# Patient Record
Sex: Male | Born: 1935 | Race: Black or African American | Hispanic: No | Smoking: Current every day smoker
Health system: Southern US, Community
[De-identification: ages and names within clinical notes are randomized; demographics above are authoritative.]

## PROBLEM LIST (undated history)

## (undated) DIAGNOSIS — M25512 Pain in left shoulder: Secondary | ICD-10-CM

## (undated) DIAGNOSIS — I10 Essential (primary) hypertension: Secondary | ICD-10-CM

## (undated) DIAGNOSIS — M25561 Pain in right knee: Secondary | ICD-10-CM

## (undated) DIAGNOSIS — E785 Hyperlipidemia, unspecified: Secondary | ICD-10-CM

## (undated) DIAGNOSIS — I69391 Dysphagia following cerebral infarction: Secondary | ICD-10-CM

## (undated) DIAGNOSIS — I639 Cerebral infarction, unspecified: Secondary | ICD-10-CM

## (undated) HISTORY — DX: Cerebral infarction, unspecified: I63.9

## (undated) HISTORY — PX: ROTATOR CUFF REPAIR: SHX139

## (undated) HISTORY — PX: HERNIA REPAIR: SHX51

## (undated) HISTORY — PX: CARDIAC SURGERY: SHX584

## (undated) HISTORY — PX: PEG TUBE PLACEMENT: SUR1034

## (undated) HISTORY — PX: OTHER SURGICAL HISTORY: SHX169

---

## 1997-11-09 ENCOUNTER — Ambulatory Visit (HOSPITAL_COMMUNITY): Admission: RE | Admit: 1997-11-09 | Discharge: 1997-11-09 | Payer: Self-pay | Admitting: *Deleted

## 1997-11-14 ENCOUNTER — Ambulatory Visit (HOSPITAL_COMMUNITY): Admission: RE | Admit: 1997-11-14 | Discharge: 1997-11-14 | Payer: Self-pay | Admitting: Urology

## 1998-02-06 ENCOUNTER — Ambulatory Visit (HOSPITAL_COMMUNITY): Admission: RE | Admit: 1998-02-06 | Discharge: 1998-02-06 | Payer: Self-pay | Admitting: *Deleted

## 1998-12-15 ENCOUNTER — Emergency Department (HOSPITAL_COMMUNITY): Admission: EM | Admit: 1998-12-15 | Discharge: 1998-12-15 | Payer: Self-pay | Admitting: Emergency Medicine

## 1998-12-25 ENCOUNTER — Encounter: Admission: RE | Admit: 1998-12-25 | Discharge: 1999-03-25 | Payer: Self-pay | Admitting: *Deleted

## 1999-07-24 ENCOUNTER — Ambulatory Visit (HOSPITAL_COMMUNITY): Admission: RE | Admit: 1999-07-24 | Discharge: 1999-07-24 | Payer: Self-pay | Admitting: *Deleted

## 1999-07-24 ENCOUNTER — Encounter (INDEPENDENT_AMBULATORY_CARE_PROVIDER_SITE_OTHER): Payer: Self-pay | Admitting: *Deleted

## 1999-11-15 ENCOUNTER — Emergency Department (HOSPITAL_COMMUNITY): Admission: EM | Admit: 1999-11-15 | Discharge: 1999-11-15 | Payer: Self-pay | Admitting: *Deleted

## 1999-11-26 ENCOUNTER — Encounter: Payer: Self-pay | Admitting: Surgery

## 1999-11-27 ENCOUNTER — Observation Stay (HOSPITAL_COMMUNITY): Admission: RE | Admit: 1999-11-27 | Discharge: 1999-11-28 | Payer: Self-pay | Admitting: Surgery

## 2001-08-19 ENCOUNTER — Encounter: Payer: Self-pay | Admitting: Ophthalmology

## 2001-08-22 ENCOUNTER — Ambulatory Visit (HOSPITAL_COMMUNITY): Admission: RE | Admit: 2001-08-22 | Discharge: 2001-08-22 | Payer: Self-pay | Admitting: Ophthalmology

## 2002-04-06 ENCOUNTER — Ambulatory Visit (HOSPITAL_COMMUNITY): Admission: RE | Admit: 2002-04-06 | Discharge: 2002-04-06 | Payer: Self-pay | Admitting: Ophthalmology

## 2003-01-31 ENCOUNTER — Emergency Department (HOSPITAL_COMMUNITY): Admission: EM | Admit: 2003-01-31 | Discharge: 2003-01-31 | Payer: Self-pay | Admitting: Emergency Medicine

## 2003-01-31 ENCOUNTER — Encounter: Payer: Self-pay | Admitting: Emergency Medicine

## 2005-01-01 ENCOUNTER — Inpatient Hospital Stay (HOSPITAL_COMMUNITY): Admission: EM | Admit: 2005-01-01 | Discharge: 2005-01-01 | Payer: Self-pay | Admitting: *Deleted

## 2006-11-09 ENCOUNTER — Inpatient Hospital Stay (HOSPITAL_COMMUNITY): Admission: EM | Admit: 2006-11-09 | Discharge: 2006-11-10 | Payer: Self-pay | Admitting: Emergency Medicine

## 2006-12-25 ENCOUNTER — Emergency Department (HOSPITAL_COMMUNITY): Admission: EM | Admit: 2006-12-25 | Discharge: 2006-12-25 | Payer: Self-pay | Admitting: Emergency Medicine

## 2008-06-07 ENCOUNTER — Emergency Department (HOSPITAL_COMMUNITY): Admission: EM | Admit: 2008-06-07 | Discharge: 2008-06-07 | Payer: Self-pay | Admitting: Emergency Medicine

## 2009-08-20 ENCOUNTER — Emergency Department (HOSPITAL_COMMUNITY): Admission: EM | Admit: 2009-08-20 | Discharge: 2009-08-20 | Payer: Self-pay | Admitting: Emergency Medicine

## 2009-08-23 ENCOUNTER — Emergency Department (HOSPITAL_COMMUNITY): Admission: EM | Admit: 2009-08-23 | Discharge: 2009-08-23 | Payer: Self-pay | Admitting: Emergency Medicine

## 2009-08-27 ENCOUNTER — Emergency Department (HOSPITAL_COMMUNITY): Admission: EM | Admit: 2009-08-27 | Discharge: 2009-08-27 | Payer: Self-pay | Admitting: Emergency Medicine

## 2010-10-20 LAB — GLUCOSE, CAPILLARY: Glucose-Capillary: 185 mg/dL — ABNORMAL HIGH (ref 70–99)

## 2010-10-20 LAB — URINALYSIS, ROUTINE W REFLEX MICROSCOPIC
Bilirubin Urine: NEGATIVE
Glucose, UA: NEGATIVE mg/dL
Hgb urine dipstick: NEGATIVE
Hgb urine dipstick: NEGATIVE
Ketones, ur: 15 mg/dL — AB
Specific Gravity, Urine: 1.013 (ref 1.005–1.030)
pH: 5.5 (ref 5.0–8.0)
pH: 7 (ref 5.0–8.0)

## 2010-10-20 LAB — URINE MICROSCOPIC-ADD ON

## 2010-12-19 NOTE — Op Note (Signed)
. [facility]  Patient:    [name], [name]                       MRN: [id number] Proc. Date: 11/27/99 Adm. Date:  [id number] Attending:  Abigail Miyamoto A                           Operative Report  PREOPERATIVE DIAGNOSIS:  Umbilical hernia.  POSTOPERATIVE DIAGNOSIS:  Umbilical hernia.  PROCEDURE:  Repair of umbilical hernia.  SURGEON:  Abigail Miyamoto, M.D.  ANESTHESIA:  General endotracheal anesthesia.  ESTIMATED BLOOD LOSS:  Minimal.  PROCEDURE IN DETAIL:  The patient was brought to the operating room and identified as Bill Jackson.  He was placed supine on the operating room table and then general anesthesia was induced.  Next, the patients abdomen was prepped and draped in the usual sterile fashion.  Using a #10 blade, a small transverse incision was made  just above the umbilicus.  The incision was carried down through the abdominal fascia with the electrocautery.  The umbilical hernia sac was then controlled circumferentially with a hemostat.  Then using the electrocautery, it was separated from the overlying umbilical sac.  The sac was then completely excised.  A finger was then passed into the abdominal cavity and a second fascial defect was identified superior in the midline to the umbilical hernia defect.  This hernia sac contained preperitoneal fat.  This was excised.  The fascia was then opened up connecting the two defects.  Next, the fascial defect was closed with interrupted 0 Prolene sutures.  Once the fascia was reapproximated, the wound was irrigated  with normal saline.  The umbilical skin was then sutured back in place with a 0 Vicryl suture.  Subcutaneous layer was then reapproximated with interrupted 3-0 Vicryl sutures and skin was closed with running 4-0 Monocryl. Steri-Strips, gauze and Tegaderm were applied.  The patient tolerated the procedure well. All sponge, needle and instrument counts were correct at  the end of the procedure. The patient was then extubated in the operating room and taken in stable condition o the recovery room. DD:  11/27/99 TD:  11/28/99 Job: [id number]

## 2010-12-19 NOTE — Op Note (Signed)
[place]  Patient:    Bill Jackson, Bill Jackson Visit Number: [id number] MRN: [id number]          Service Type: DSU Location: [address] [facility] 2899 20 Attending Physician:  Karenann Cai Dictated by:   Marya Landry Carlyle Lipa., M.D. Proc. Date: 08/25/01 Admit Date:  08/22/2001 Discharge Date: 08/22/2001                             Operative Report  PREOPERATIVE DIAGNOSIS:  Immature cataract - right eye.  POSTOPERATIVE DIAGNOSIS:  Immature cataract - right eye.  PROCEDURE:  Kelman phacoemulsification cataract - right eye with intraocular lens implantation.  ANESTHESIA:  Local using Xylocaine 2% with Marcaine 0.75% and Wydase.  JUSTIFICATION FOR PROCEDURE:  This is a 76 year old gentleman with a history of trauma to the right eye, who presented complaining of blurring of vision. He was evaluated and found to have a marked anisometropia with a visual acuity best corrected to 20/80 on the right and 20/100 on the left.  There was a dense cataract on the right with a lesser cataract on the left.  Cataract extraction with intraocular lens implantation was recommended.  He is admitted at this time for that purpose.  DESCRIPTION OF PROCEDURE:  Under the influence of IV sedation, a Van Lint akinesia and retrobulbar anesthesia was given.  The patient was prepped and draped in the usual manner.  The lid speculum was inserted under the upper and lower lid of the right eye and a 4-0 silk traction suture was passed through the belly of the superior rectus muscle for traction.  A fornix-based conjunctival flap was turned and hemostasis achieved by using cautery.  An incision made in the sclera approximately 1 mm posterior to the limbus.  This incision was dissected down into clear cornea using crescent blade.  A side port incision was made at the 1:30 oclock position.  Occucoat was injected into the eye through the side port incision.  The anterior chamber was  then entered through the corneoscleral tunnel incision at the 11:30 oclock position with a 3.2 mm keratome.  An anterior capsulotomy was done using a bent 25-gauge needle.  The nucleus was hydrodissected using Xylocaine.  The KPE handpiece was passed into the eye and the nucleus was emulsified without difficulty.  The residual cortical material was aspirated.  The posterior capsule was polished using olive tip polisher.  The wound was widened slightly to accommodate an oval 5 x 6 intraocular lens.  This lens was seated into the eye, but as it was seated, it was noted to sink towards the bottom.  There at no time had there been vitreous loss and there was no observed rupture of the posterior capsule.  The lens was retrieved with difficulty and an anterior vitrectomy was done.  The wound was widened so that an anterior chamber lens could be inserted into the eye across the pupil.  This insertion was done without difficulty.  A peripheral iridectomy was made in the iris. The corneoscleral wound was then closed by using a combination of 8-0 Vicryl and 10-0 nylon.  After ascertaining that the wound was airtight and watertight, the conjunctiva was closed using thermocautery.   One cc of Celestone, 0.5 cc of gentamicin were injected subconjunctivally.  Maxitrol  ophthalmic ointment and Pilopine ointment and atropine drops were applied along with a patch and Fox shield.  The patient tolerated the procedure well and  was discharged to the postanesthesia recovery in satisfactory condition. He was instructed to rest today, to take Vicodin q.4h. as needed for pain and see me in the office tomorrow for further evaluation.  DISCHARGE DIAGNOSIS:  Immature cataract - right eye. Dictated by:   Marya Landry Carlyle Lipa., M.D. Attending Physician:  Karenann Cai DD:  08/25/01 TD:  08/25/01 Job: [id number]

## 2010-12-19 NOTE — Discharge Summary (Signed)
[name], [name]                ACCOUNT NO.:  [id number]   MEDICAL RECORD NO.:  [id number]          PATIENT TYPE:  INP   LOCATION:  2031                         FACILITY:  [facility]   PHYSICIAN:  Dani Gobble, MD       DATE OF BIRTH:  02/17/1935   DATE OF ADMISSION:  11/09/2006  DATE OF DISCHARGE:  11/10/2006                               DISCHARGE SUMMARY   DISCHARGE DIAGNOSES:  1. Known cardiac chest pain - etiology undetermined.  2. Diabetes mellitus.  3. Hypertension.  4. Known coronary artery disease with remote percutaneous transluminal      coronary angioplasty, approximately 15 years ago.  5. Gastroesophageal reflux disease.  6. Arthritis.  7. Dyslipidemia with statin intolerance.   [name] is a 76 year old gentleman presented to [facility]  for evaluation of complaints of lightheadedness and chest pressure.  He  has a previous history of coronary artery disease and approximately 15  years ago underwent angioplasty and a repeat cath was a year after the  procedure and none since then.  He woke up on the morning of his  presentation to the emergency room, felt well, but then all of sudden  while making coffee started feeling really lightheaded and then  developed chest tightness and pressure, as well as shortness of breath.  He came to the emergency room where he was evaluated by Dr. Domingo Sep who  admitted him to rule out MI protocol.   We cycled his enzymes, which were negative x3 and on the next day, November 10, 2006, patient underwent coronary angiography by Dr. Clarene Duke.  Cath  revealed normal RCA, normal circumflex of all the branches and only 40%  of the proximal LAD stenosis, but mid and distal heart were free of  disease, 2 diagonals were patent also.  Left ventricular systolic  function was normal with ejection fraction 60%.  The distal lower was  normal.  No aneurysm.  No iliac disease and no renal artery stenosis  noted.   Patient was discharged home  later after catheterization when his bedrest  expired.   HOSPITAL LABORATORIES:  BMP revealed a sodium of 140, potassium 3.5,  chloride 106, CO2 26, glucose 188, BUN 14, creatinine 1.09.  CBC showed  white blood cell count 4.3, hemoglobin 12.7, hematocrit 36.2, platelet  count 234.   Lipid profile showed total cholesterol 265, triglycerides 111, HDL 107,  LDL 126.   DISCHARGE MEDICATIONS:  1. Protonix 40 mg daily.  2. Toprol-XL 25 mg daily.  3. Glipizide 5 mg daily.  4. Aspirin 81 mg daily.  5. Actos resume home dose.  6. Novolin 70/30 mg subcu as before.   DISCHARGE INSTRUCTION:  No driving and no lifting greater than 5 pounds  for 3 days postcath.  Report to our office any problems with any groin  puncture site.   DISCHARGE FOLLOWUP:  April 29 at 12 p.m. with Dr. Clarene Duke.      Raymon Mutton, P.A.    ______________________________  Dani Gobble, MD    MK/MEDQ  D:  12/31/2006  T:  01/01/2007  Job:  [id number]   cc:   Thereasa Solo. Little, M.D.

## 2010-12-19 NOTE — Op Note (Signed)
NAME:  Bill Jackson, Bill Jackson                          ACCOUNT NO.:  [id number]   MEDICAL RECORD NO.:  [id number]                   PATIENT TYPE:  OIB   LOCATION:  2899                                 FACILITY:  [facility]   PHYSICIAN:  Marya Landry. Carlyle Lipa., M.D.      DATE OF BIRTH:  [birth year]   DATE OF PROCEDURE:  04/06/2002  DATE OF DISCHARGE:  04/06/2002                                 OPERATIVE REPORT   PREOPERATIVE DIAGNOSIS:  Immature cataract, left eye.   POSTOPERATIVE DIAGNOSIS:  Immature cataract, left eye.   OPERATION:  Kelman phacoemulsification of cataract, left eye.   ANESTHESIA:  Local using Xylocaine 2% with Marcaine 0.75% and Wydase.   JUSTIFICATION FOR PROCEDURE:  This is a 76 year old diabetic gentleman who  has undergone a cataract extraction of the right eye.  He still complains of  difficulty seeing to read and to drive.  He works as a Midwife.  He has  diabetic retinopathy.  Because of his complaint of difficulty seeing to do  his work, cataract extraction was recommended.  He is admitted at this time  for that procedure.   Under the influence of IV sedation and Van Lint akinesia, retrobulbar  anesthesia was given.  The patient was prepped and draped in the usual  manner.  The lid speculum was inserted under the under lower lid of the left  eye and a 4-0 silk retraction suture was passed through the belly of the  superior rectus muscle for retraction.  A fornix-based conjunctival flap was  turned and hemostasis achieved with the use of the cautery.   A grooved incision was made in the sclera at the limbus approximately 1 mm  posterior to the limbus.  This incision was dissected down to clear cornea  using a crescent blade.  A side-port incision was made at the 1:30 o'clock  position.  OcuCoat was injected into the eye through the side-port incision.  The anterior chamber was entered through the corneoscleral tunnel incision  at the 11:30 o'clock position.  An  anterior capsulotomy was done using a  bent 25-gauge needle.  The nucleus was hydrodissected using Xylocaine.  The  PE handpiece was passed into the eye and the nucleus of the muscle followed  without difficulty.  The residual cortical material was aspirated.  During  the aspiration of the cortex, there appeared to have been a small tear to  occur inferiorly in the posterior capsule.  No vitreous was entered or  expressed from the wound.  As much of the cortex as could be aspirated was  aspirated.  The wound was advanced slightly to accommodate easily a 60 rigid  posterior chamber lens.  This lens was seated into the eye behind the iris  without difficulty.  The anterior chamber was reformed and the pupil was  constricted using Miochol.  The residual OcuCoat was aspirated from the eye.  The corneoscleral wound was closed using a  single horizontal suture of 10-0  nylon.  After it was ascertained that the wound was watertight, the  conjunctiva was closed using thermocautery.  Celestone, 0.1 cc, and 0.5 cc  of gentamicin were injected subconjunctivally.  Maxitrol ophthalmic ointment  and Pilopine ointment were applied along with the patch and Fox shield.  The  patient tolerated the procedure well and was discharged to the  postanesthesia recovery room in satisfactory condition.   He is instructed to rest today, to take Vicodin every four hours as needed  for the pain and to be seen in my office tomorrow for further evaluation.   DISCHARGE DIAGNOSIS:  Immature cataract, left eye.                                                Marya Landry. Carlyle Lipa., M.D.    TEB/MEDQ  D:  04/06/2002  T:  04/07/2002  Job:  [id number]

## 2010-12-19 NOTE — H&P (Signed)
Bill Jackson, Bill Jackson NO.:  [id number]   MEDICAL RECORD NO.:  [id number]          PATIENT TYPE:  EMS   LOCATION:  MAJO                         FACILITY:  [facility]   PHYSICIAN:  Deirdre Peer. Polite, M.D. DATE OF BIRTH:  07-22-1935   DATE OF ADMISSION:  12/31/2004  DATE OF DISCHARGE:                                HISTORY & PHYSICAL   CHIEF COMPLAINT:  Chest wall pain, nausea, and vomiting.   HISTORY OF PRESENT ILLNESS:  Bill Jackson is a pleasant 76 year old male with  known history of hypertension, diabetes, high cholesterol who presents to  the ED with complaints of chest pain, status post fall approximately three  to four days ago, as well as emesis. The patient had been in his usual state  of health until Sunday when he got in the middle of the night, fell, and hit  the small end table with the left side of his chest. Since then, the patient  states he has not been feeling good. The patient stated on Tuesday, he had  two episodes of emesis. Denies any blood. Throughout that time, the patient  states that the chest wall pain has been increasing in severity and  therefore presented to the ED.   In the ED, the patient was evaluated. The patient had a CT which did not  show any acute abnormalities related to internal organs, i.e. liver or  spleen laceration. The patient did have some hypodense lesions in the spleen  which outpatient follow-up was probably recommended. The patient had further  labs which showed a mild elevation in amylase. [facility] was called  for further evaluation and treatment of pancreatitis and chest wall  contusion. At the time of my evaluation, the patient is alert and oriented  x3 and in no apparent distress. Did complain of left chest wall pain with  palpation. Denies any fevers or chills. States that he had emesis on Tuesday  but none since then. He denies any diarrhea, constipation. No dysuria. The  patient does drink on a daily basis;  however, states that he has not been  feeling well so he has not drank since Sunday night. Denies having any  withdrawal symptoms. Currently, states he has never had these symptoms  before. Admission is deemed necessary for further evaluation and treatment  of the pancreatitis and chest wall contusion.   PAST MEDICAL HISTORY:  1.  Diabetes.  2.  Hypertension.  3.  High cholesterol.  4.  The patient denies any lung disease, cancer, or CVA.   MEDICATIONS:  1.  Glipizide 10 mg b.i.d.  2.  Actos 45 mg daily.  3.  Glucophage 1 gm b.i.d.  4.  Insulin 70/30 b.i.d.  5.  The patient takes blood pressure medicine for which he is unsure.   SOCIAL HISTORY:  Positive for alcohol on a daily basis. A couple glasses of  wine as well as alcohol daily. Positive tobacco, pack per day. Denies any  drugs. The patient states his last drink has been Sunday. Denies having any  withdrawal symptoms.   PAST SURGICAL HISTORY:  1.  Umbilical hernia approximately five years ago.  2.  Right rotator cuff repair in the past.   ALLERGIES:  Reports allergy to PENICILLIN and intolerance to cholesterol  medicine which causes cramps.   FAMILY HISTORY:  Mother with coronary artery disease. Father unknown.  Brother and sister are essentially healthy.   REVIEW OF SYMPTOMS:  As stated in the HPI.   PHYSICAL EXAMINATION:  GENERAL:  The patient is alert and oriented x3. Mild  to moderate distress secondary to the left chest wall pain.  VITAL SIGNS:  Temperature 97.3, blood pressure 141/81, pulse 101,  respiratory rate 20. Saturating 97%.  HEENT:  Significant for muddy sclerae. Positive for arcus senilis as well as  positive for cataract surgery. Moist mucus membranes.  NECK:  No nodes. No JVD.  CHEST:  Moderate air movement without rales or rhonchi.  CARDIOVASCULAR:  Regular. No S3.  ABDOMEN:  Positive bowel sounds. Positive tenderness on the left chest wall  with some bruising as well from prior fall.   EXTREMITIES:  No clubbing, cyanosis, or edema.  NEUROLOGICAL:  Cranial nerves II through XII intact. Motor is 5/5. Negative  Romberg. Deep tendon reflexes are symmetrical. Gait not tested.  RECTAL:  Deferred.   LABORATORY DATA:  CT of the abdomen and pelvis show hypodense lesion in the  liver. Recommend outpatient follow-up. CBC shows white count 5.9, hemoglobin  16, MCV 92, platelets 344,000. Sodium 142, potassium 4.4, chloride 100,  carbon dioxide 29, BUN 27, creatinine 1.9, amylase 173, lipase 35. UA shows  a specific gravity of 1.033, 100 protein, 15 ketones, moderate bilirubin.  Leukocyte esterase small, few bacteria.   ASSESSMENT/PLAN:  1.  Mild pancreatitis, most likely secondary to alcohol use.  2.  Chest wall contusion on the left, status post fall. Please note CT is      negative for any acute abnormalities, i.e. splenic or liver lacerations.  3.  Diabetes.  4.  Hypertension.  5.  Daily alcohol use.  6.  Tobacco use.  7.  Abnormal CT showing hypodense liver lesions, questionable cyst.  8.  Azotemia, questionable baseline.   RECOMMENDATIONS:  Recommend the patient be admitted for observation. We will  start him on IV fluids, IV analgesia. Check follow-up renal function post IV  fluids as the patient states that he recently had a full physical from  his primary. At this time, we will not pursue lipids or hemoglobin A1c as  the information has most likely been ordered by his primary doctor. We will  have follow up amylase, lipase, and BMET if labs are within normal limits.  The patient most likely can be discharged in the a.m. with further  outpatient follow up with his primary doctor.       RDP/MEDQ  D:  12/31/2004  T:  01/01/2005  Job:  [id number]

## 2010-12-19 NOTE — Cardiovascular Report (Signed)
NAMEJABRON, WEESE NO.:  [id number]   MEDICAL RECORD NO.:  [id number]          PATIENT TYPE:  INP   LOCATION:  2031                         FACILITY:  [facility]   PHYSICIAN:  Thereasa Solo. Little, M.D. DATE OF BIRTH:  [birth year]   DATE OF PROCEDURE:  11/10/2006  DATE OF DISCHARGE:                            CARDIAC CATHETERIZATION   INDICATIONS FOR PROCEDURE:  This 76 year old male was admitted with  chest discomfort and light-headedness.  He had a prior history of an  angioplasty approximately 20 years ago by Dr. Daisy Floro.  There are no  records available.  His cardiac enzymes are unremarkable, and he was  brought to the cath lab for evaluation of his coronary anatomy.   After obtaining informed consent, the patient was prepped and draped in  the usual sterile fashion, exposing the right groin.  Applying local  anesthetic with 1% Xylocaine, the Seldinger technique was employed, and  a 5-French introducer sheath was placed into the right femoral artery.  Left and right coronary arteriography and ventriculography in the RAO  projection and a distal aortogram was performed.   1. Total contrast 125 mL.  2. Equipment:  A 5-French diagnostic Judkins configuration catheters.  3. Complications:  None.   MEDICATIONS:  2 mg of IV versed.   RESULTS:  1. Hemodynamic monitoring:  Central aortic pressure was 142/83.  Left      ventricular pressure was 141/7 and there was no aortic valve      gradient noted at the time of pullback.  2. Ventriculography:  Ventriculography in the area of projection using      25 mL of contrast at 12 mL per second revealed good opacification      of the left ventricle.  There was normal LV systolic function.      Ejection fraction was approximately 60%.  The left ventricular end      diastolic pressure was 15, and no mitral regurgitation was      appreciated.  3. Distal aortogram:  The distal aortogram done above the level of the      renal  arteries showed no evidence of renal artery stenosis, no      evidence of abdominal aortic aneurysm, and no iliac disease.  4. Coronary arteriography:  On fluoroscopy, no calcification was      appreciated.   1. Left main was short and bifurcated and was free of disease.  2. LAD:  The LAD extended down and across the apex of the heart.  The      proximal portion of the LAD had areas of 30-40% sequential      narrowing.  There was brisk TIMI 3 flow.  There were 2 diagonal      vessels that were free of disease, and the mid and distal LAD were      free of disease.  3. Circumflex:  The circumflex was a dominant vessel, giving rise to a      small PDA.  There was a very large and long first OM vessel that      was free of  disease, a medium-size second OM vessel, and a small      third OM vessel.  This entire system was free of disease.  4. Right coronary artery:  This was a nondominant vessel, supplying      only the RV free-wall and was free of disease.   CONCLUSION:  1. Normal left ventricular systolic function.  2. No abdominal aortic aneurysm.  3. No renal artery stenosis.  4. Mild proximal left anterior descending disease with sequential 30%      and 40% areas of narrowing before the diagonals.   I clearly cannot explain his chest pain from a cardiac standpoint.   I have discontinued his IV heparin and I have discontinued his  nitroglycerin.  He should be ready for discharge later today.           ______________________________  Thereasa Solo. Little, M.D.     ABL/MEDQ  D:  11/10/2006  T:  11/10/2006  Job:  [id number]   cc:   Cath Lab  Dani Gobble, MD  Derry Skill, MD

## 2011-12-05 ENCOUNTER — Encounter (HOSPITAL_COMMUNITY): Payer: Self-pay | Admitting: Emergency Medicine

## 2011-12-05 ENCOUNTER — Emergency Department (HOSPITAL_COMMUNITY): Payer: Medicare Other

## 2011-12-05 ENCOUNTER — Emergency Department (HOSPITAL_COMMUNITY)
Admission: EM | Admit: 2011-12-05 | Discharge: 2011-12-05 | Disposition: A | Discharge status: Home / Self Care | Payer: Medicare Other | Attending: Emergency Medicine | Admitting: Emergency Medicine

## 2011-12-05 DIAGNOSIS — E119 Type 2 diabetes mellitus without complications: Secondary | ICD-10-CM | POA: Insufficient documentation

## 2011-12-05 DIAGNOSIS — R220 Localized swelling, mass and lump, head: Secondary | ICD-10-CM | POA: Insufficient documentation

## 2011-12-05 DIAGNOSIS — R05 Cough: Secondary | ICD-10-CM | POA: Insufficient documentation

## 2011-12-05 DIAGNOSIS — F17200 Nicotine dependence, unspecified, uncomplicated: Secondary | ICD-10-CM | POA: Insufficient documentation

## 2011-12-05 DIAGNOSIS — Z79899 Other long term (current) drug therapy: Secondary | ICD-10-CM | POA: Insufficient documentation

## 2011-12-05 DIAGNOSIS — Z794 Long term (current) use of insulin: Secondary | ICD-10-CM | POA: Insufficient documentation

## 2011-12-05 DIAGNOSIS — R0682 Tachypnea, not elsewhere classified: Secondary | ICD-10-CM | POA: Insufficient documentation

## 2011-12-05 DIAGNOSIS — I10 Essential (primary) hypertension: Secondary | ICD-10-CM | POA: Insufficient documentation

## 2011-12-05 DIAGNOSIS — J3489 Other specified disorders of nose and nasal sinuses: Secondary | ICD-10-CM | POA: Insufficient documentation

## 2011-12-05 DIAGNOSIS — R079 Chest pain, unspecified: Secondary | ICD-10-CM | POA: Insufficient documentation

## 2011-12-05 DIAGNOSIS — R062 Wheezing: Secondary | ICD-10-CM | POA: Insufficient documentation

## 2011-12-05 DIAGNOSIS — J069 Acute upper respiratory infection, unspecified: Secondary | ICD-10-CM | POA: Insufficient documentation

## 2011-12-05 DIAGNOSIS — R059 Cough, unspecified: Secondary | ICD-10-CM | POA: Insufficient documentation

## 2011-12-05 HISTORY — DX: Essential (primary) hypertension: I10

## 2011-12-05 MED ORDER — ALBUTEROL SULFATE HFA 108 (90 BASE) MCG/ACT IN AERS
2.0000 | INHALATION_SPRAY | RESPIRATORY_TRACT | Status: DC | PRN
  Administered 2011-12-05: 2 via RESPIRATORY_TRACT
  Filled 2011-12-05: qty 6.7

## 2011-12-05 NOTE — ED Provider Notes (Signed)
History     CSN: [id number]  Arrival date & time 12/05/11  0811   First MD Initiated Contact with Patient 12/05/11 [phone]      Chief Complaint  Patient presents with  . URI    (Consider location/radiation/quality/duration/timing/severity/associated sxs/prior treatment) Patient is a 77 y.o. male presenting with URI. The history is provided by the patient.  URI The primary symptoms include cough and wheezing. Primary symptoms do not include fever, headaches, abdominal pain, nausea or vomiting. Episode onset: 3 weeks ago. This is a new problem. The problem has been gradually worsening.  Episode onset: 3 weeks ago. The cough is productive. The sputum is yellow. It is exacerbated by allergens.  Associated with: No recent sick contacts. Symptoms associated with the illness include congestion and rhinorrhea. The illness is not associated with chills or facial pain. The following treatments were addressed: Decongestant: Somewhat effective. Risk factors for severe complications from URI include being elderly.    Past Medical History  Diagnosis Date  . Hypertension   . Diabetes mellitus     Past Surgical History  Procedure Date  . Heart stents x3  . Cardiac surgery   . Hernia repair   . Rotator cuff repair     History reviewed. No pertinent family history.  History  Substance Use Topics  . Smoking status: Current Everyday Smoker  . Smokeless tobacco: Not on file  . Alcohol Use: Yes      Review of Systems  Constitutional: Negative for fever and chills.  HENT: Positive for congestion and rhinorrhea.   Respiratory: Positive for cough and wheezing.   Gastrointestinal: Negative for nausea, vomiting and abdominal pain.  Neurological: Negative for headaches.  All other systems reviewed and are negative.    Allergies  Penicillins  Home Medications   Current Outpatient Rx  Name Route Sig Dispense Refill  . BENAZEPRIL HCL 40 MG PO TABS Oral Take 40 mg by mouth daily.    Marland Kitchen  GLIPIZIDE PO Oral Take 1 tablet by mouth daily.    . INSULIN ISOPHANE & REGULAR (70-30) 100 UNIT/ML [place] SUSP Subcutaneous Inject 15 Units into the skin as needed. Per sliding scale    . METFORMIN HCL 1000 MG PO TABS Oral Take by mouth daily.    Marland Kitchen OVER THE COUNTER MEDICATION Oral Take 3 capsules by mouth daily. 3 pack multivitamins    . PRESCRIPTION MEDICATION Oral Take 1 tablet by mouth daily. Acid reflux    . PRESCRIPTION MEDICATION Oral Take 1 tablet by mouth daily. Fluid pill    . PRESCRIPTION MEDICATION Oral Take 1 tablet by mouth daily. Allergy medication      BP 128/77  Pulse 61  Temp(Src) 97.9 F (36.6 C) (Oral)  Resp 20  SpO2 98%  Physical Exam  Constitutional: He is oriented to person, place, and time. He appears well-developed and well-nourished. No distress.  HENT:  Head: Normocephalic and atraumatic.  Right Ear: External ear normal.  Left Ear: External ear normal.  Nose: Mucosal edema and rhinorrhea present.  Mouth/Throat: Oropharynx is clear and moist.  Eyes: Conjunctivae and EOM are normal. Pupils are equal, round, and reactive to light. Right eye exhibits no discharge.  Neck: Normal range of motion. Neck supple.  Cardiovascular: Normal rate, regular rhythm, normal heart sounds and intact distal pulses.   No murmur heard. Pulmonary/Chest: Tachypnea noted. No respiratory distress. He has no decreased breath sounds. He has wheezes. He has no rhonchi. He has no rales.  Scant wheezes and lower lung fields  Abdominal: Soft. There is no tenderness.  Musculoskeletal: Normal range of motion. He exhibits no edema and no tenderness.  Neurological: He is alert and oriented to person, place, and time.  Skin: Skin is warm and dry. No rash noted.  Psychiatric: He has a normal mood and affect.    ED Course  Procedures (including critical care time)  Labs Reviewed - No data to display Dg Chest 2 View  12/05/2011  *RADIOLOGY REPORT*  Clinical Data: Cough.  Chest pain.  Chest  congestion.  Long-time smoker.  CHEST - 2 VIEW 12/05/2011:  Comparison: Portable chest x-ray 11/09/2006, 01/31/2003, and one- view chest x-ray 12/31/2004.  Findings: Cardiac silhouette normal in size, unchanged.  Thoracic aorta mildly tortuous.  Hilar and mediastinal contours otherwise unremarkable. Lungs clear.  Bronchovascular markings normal. Pulmonary vascularity normal.  No pneumothorax.  No pleural effusions.  Mild degenerative changes involving the thoracic spine and mild thoracolumbar scoliosis convex right.  No significant interval change.  IMPRESSION: No acute cardiopulmonary disease.  Stable examination.  Original Report Authenticated By: Arnell Sieving, M.D.    Date: 12/05/2011  Rate: 54  Rhythm: normal sinus rhythm  QRS Axis: normal  Intervals: normal  ST/T Wave abnormalities: nonspecific T wave changes  Conduction Disutrbances:none  Narrative Interpretation:   Old EKG Reviewed: unchanged     No diagnosis found.    MDM   Pt with symptoms consistent with viral URI vs allergies with wheezing at night and chest congestion.  He states that in nov he had PNA and did not want to miss anything.  Denies fever or SOB and normal VS here.  Well appearing here.  No signs of breathing difficulty scant wheezes on exam. No signs of pharyngitis, otitis or abnormal abdominal findings.   Patient has an extensive heart history and states that he gets intermittent chest pain since his symptoms started however the chest pain is not concerning for cardiac etiology. He states it occurs after severe coughing spells. He is not currently having any pain now and states his pain does not feel like his prior heart episodes. CXR wnl and pt to return with any further problems.  Pt given inhaler and encourage to f/u with PMD.         Gwyneth Sprout, MD 12/05/11 1001

## 2011-12-05 NOTE — ED Notes (Signed)
Back from xray

## 2011-12-05 NOTE — ED Notes (Signed)
Pt reports cough and cold symptoms with wheezing x 3 weeks. Pt also reports intermittent left sided chest pain onset after coughing.

## 2011-12-05 NOTE — ED Notes (Signed)
Discharged with instructions and inhaler with an understanding of the use.

## 2011-12-05 NOTE — ED Notes (Signed)
Patient to xray via stretcher

## 2012-02-09 ENCOUNTER — Emergency Department (HOSPITAL_COMMUNITY): Payer: Medicare Other

## 2012-02-09 ENCOUNTER — Encounter (HOSPITAL_COMMUNITY): Payer: Self-pay | Admitting: Emergency Medicine

## 2012-02-09 ENCOUNTER — Emergency Department (HOSPITAL_COMMUNITY)
Admission: EM | Admit: 2012-02-09 | Discharge: 2012-02-10 | Disposition: A | Discharge status: Home Health | Payer: Medicare Other | Attending: Emergency Medicine | Admitting: Emergency Medicine

## 2012-02-09 DIAGNOSIS — F17200 Nicotine dependence, unspecified, uncomplicated: Secondary | ICD-10-CM | POA: Insufficient documentation

## 2012-02-09 DIAGNOSIS — R6883 Chills (without fever): Secondary | ICD-10-CM | POA: Insufficient documentation

## 2012-02-09 DIAGNOSIS — I4460 Unspecified fascicular block: Secondary | ICD-10-CM | POA: Insufficient documentation

## 2012-02-09 DIAGNOSIS — Z794 Long term (current) use of insulin: Secondary | ICD-10-CM | POA: Insufficient documentation

## 2012-02-09 DIAGNOSIS — W19XXXA Unspecified fall, initial encounter: Secondary | ICD-10-CM

## 2012-02-09 DIAGNOSIS — I10 Essential (primary) hypertension: Secondary | ICD-10-CM | POA: Insufficient documentation

## 2012-02-09 DIAGNOSIS — M25569 Pain in unspecified knee: Secondary | ICD-10-CM | POA: Insufficient documentation

## 2012-02-09 DIAGNOSIS — IMO0002 Reserved for concepts with insufficient information to code with codable children: Secondary | ICD-10-CM

## 2012-02-09 DIAGNOSIS — F1010 Alcohol abuse, uncomplicated: Secondary | ICD-10-CM | POA: Insufficient documentation

## 2012-02-09 DIAGNOSIS — R29898 Other symptoms and signs involving the musculoskeletal system: Secondary | ICD-10-CM | POA: Insufficient documentation

## 2012-02-09 DIAGNOSIS — M79609 Pain in unspecified limb: Secondary | ICD-10-CM | POA: Insufficient documentation

## 2012-02-09 DIAGNOSIS — E119 Type 2 diabetes mellitus without complications: Secondary | ICD-10-CM | POA: Insufficient documentation

## 2012-02-09 DIAGNOSIS — G319 Degenerative disease of nervous system, unspecified: Secondary | ICD-10-CM | POA: Insufficient documentation

## 2012-02-09 DIAGNOSIS — R296 Repeated falls: Secondary | ICD-10-CM | POA: Insufficient documentation

## 2012-02-09 LAB — URINALYSIS, ROUTINE W REFLEX MICROSCOPIC
Leukocytes, UA: NEGATIVE
Protein, ur: NEGATIVE mg/dL
pH: 5 (ref 5.0–8.0)

## 2012-02-09 LAB — POCT I-STAT, CHEM 8
Calcium, Ion: 1.11 mmol/L — ABNORMAL LOW (ref 1.13–1.30)
Creatinine, Ser: 1.6 mg/dL — ABNORMAL HIGH (ref 0.50–1.35)
Glucose, Bld: 262 mg/dL — ABNORMAL HIGH (ref 70–99)
Hemoglobin: 14.3 g/dL (ref 13.0–17.0)
TCO2: 24 mmol/L (ref 0–100)

## 2012-02-09 LAB — CBC WITH DIFFERENTIAL/PLATELET
Eosinophils Relative: 1 % (ref 0–5)
HCT: 39.9 % (ref 39.0–52.0)
Lymphs Abs: 2.1 10*3/uL (ref 0.7–4.0)
MCHC: 35.3 g/dL (ref 30.0–36.0)
Monocytes Relative: 7 % (ref 3–12)
Neutro Abs: 1 10*3/uL — ABNORMAL LOW (ref 1.7–7.7)
RDW: 12.8 % (ref 11.5–15.5)

## 2012-02-09 MED ORDER — SODIUM CHLORIDE 0.9 % IV SOLN
Freq: Once | INTRAVENOUS | Status: AC
  Administered 2012-02-09: 21:00:00 via INTRAVENOUS

## 2012-02-09 NOTE — ED Notes (Signed)
[id number]<AV> Expected date:<BR> Expected time:<BR> Means of arrival:<BR> Comments:<BR> Elderly male with weakness in his knees

## 2012-02-09 NOTE — ED Provider Notes (Signed)
History     CSN: [id number]  Arrival date & time 02/09/12  2006   First MD Initiated Contact with Patient 02/09/12 2020      Chief Complaint  Patient presents with  . Extremity Weakness    (Consider location/radiation/quality/duration/timing/severity/associated sxs/prior treatment) HPI Comments: Patient states that on his walk to the mailbox he suddenly could not control his ambulate and kept going faster until he fell the ground. His neighbor helped him back inside, where he had another episode of gait diaturbance  Patient is a 77 y.o. male presenting with extremity weakness. The history is provided by the patient.  Extremity Weakness This is a new problem. The current episode started today. The problem occurs intermittently. The problem has been unchanged. Associated symptoms include chills. Pertinent negatives include no abdominal pain, chest pain, congestion, fatigue, fever, nausea, numbness, rash, urinary symptoms, vomiting or weakness.    Past Medical History  Diagnosis Date  . Hypertension   . Diabetes mellitus     Past Surgical History  Procedure Date  . Heart stents x3  . Cardiac surgery   . Hernia repair   . Rotator cuff repair     No family history on file.  History  Substance Use Topics  . Smoking status: Current Everyday Smoker  . Smokeless tobacco: Not on file  . Alcohol Use: Yes      Review of Systems  Constitutional: Positive for chills. Negative for fever and fatigue.  HENT: Negative for ear pain and congestion.   Eyes: Negative for visual disturbance.  Respiratory: Negative for shortness of breath.   Cardiovascular: Negative for chest pain.  Gastrointestinal: Negative for nausea, vomiting, abdominal pain and diarrhea.  Musculoskeletal: Positive for extremity weakness.  Skin: Negative for rash and wound.  Neurological: Negative for dizziness, weakness and numbness.    Allergies  Penicillins  Home Medications   Current Outpatient Rx    Name Route Sig Dispense Refill  . BENAZEPRIL HCL 40 MG PO TABS Oral Take 40 mg by mouth daily.    . INSULIN ISOPHANE & REGULAR (70-30) 100 UNIT/ML [place] SUSP Subcutaneous Inject 15 Units into the skin as needed. Per sliding scale    . GLIPIZIDE PO Oral Take 1 tablet by mouth daily.    Marland Kitchen METFORMIN HCL 1000 MG PO TABS Oral Take 1,000 mg by mouth daily.       BP 115/61  Pulse 90  Temp 98.8 F (37.1 C) (Oral)  Resp 18  SpO2 96%  Physical Exam  Constitutional: He appears well-developed and well-nourished.  HENT:  Head: Normocephalic.  Neck: Normal range of motion.  Cardiovascular: Normal rate.   Pulmonary/Chest: Effort normal. No respiratory distress. He has no wheezes. He exhibits no tenderness.  Abdominal: Soft. Bowel sounds are normal. He exhibits no distension. There is no tenderness.  Musculoskeletal: Normal range of motion. He exhibits no edema and no tenderness.       While laying in the bed normal ROM of all extremities, normal sensation, temperature, strong  pulses in feet   Skin: Skin is warm and dry. No rash noted.    ED Course  Procedures (including critical care time)   Labs Reviewed  CBC WITH DIFFERENTIAL  URINALYSIS, ROUTINE W REFLEX MICROSCOPIC   No results found.   No diagnosis found.  ED ECG REPORT   Date: 02/09/2012  EKG Time: 9:03 PM  Rate: 93  Rhythm: normal sinus rhythm,  unchanged from previous tracings  Axis: normal  Intervals:left anterior fascicular block  ST&T Change: none  Narrative Interpretation: abnormal but stable             MDM  Will obtain cbc, i-stat, UAan dhead CT Scan , EKG  After being in the department for several hours he complained of R hand and knee pain which were xrays an dare negative       Arman Filter, NP 02/10/12 0001  Arman Filter, NP 02/10/12 0001

## 2012-02-09 NOTE — ED Notes (Signed)
Pt reports weakness in his knees which started this evening.  EMS reports CBG of 258.  Denies any pain or SOB.

## 2012-02-09 NOTE — ED Notes (Signed)
Pt is aware of the need for urine sample.  

## 2012-02-10 NOTE — ED Provider Notes (Signed)
Medical screening examination/treatment/procedure(s) were conducted as a shared visit with non-physician practitioner(s) and myself.  I personally evaluated the patient during the encounter  Stumbled and fell while walking to mailbox. Did not hit head or LOC. 5/5 strength throughtout. +2 DP and PT pulses.  Smells of alcohol.  Glynn Octave, MD 02/10/12 [phone]

## 2012-10-01 DIAGNOSIS — I639 Cerebral infarction, unspecified: Secondary | ICD-10-CM | POA: Insufficient documentation

## 2012-10-01 DIAGNOSIS — Z8673 Personal history of transient ischemic attack (TIA), and cerebral infarction without residual deficits: Secondary | ICD-10-CM | POA: Insufficient documentation

## 2012-10-01 HISTORY — DX: Cerebral infarction, unspecified: I63.9

## 2012-10-24 ENCOUNTER — Emergency Department (HOSPITAL_COMMUNITY): Payer: Medicare Other

## 2012-10-24 ENCOUNTER — Encounter (HOSPITAL_COMMUNITY): Payer: Self-pay | Admitting: Radiology

## 2012-10-24 ENCOUNTER — Inpatient Hospital Stay (HOSPITAL_COMMUNITY)
Admission: EM | Admit: 2012-10-24 | Discharge: 2012-10-28 | DRG: 024 | Disposition: A | Discharge status: Rehabilitation Facility | Payer: Medicare Other | Attending: Neurology | Admitting: Neurology

## 2012-10-24 DIAGNOSIS — I629 Nontraumatic intracranial hemorrhage, unspecified: Secondary | ICD-10-CM

## 2012-10-24 DIAGNOSIS — G936 Cerebral edema: Secondary | ICD-10-CM

## 2012-10-24 DIAGNOSIS — E876 Hypokalemia: Secondary | ICD-10-CM

## 2012-10-24 DIAGNOSIS — I10 Essential (primary) hypertension: Secondary | ICD-10-CM | POA: Diagnosis present

## 2012-10-24 DIAGNOSIS — F1010 Alcohol abuse, uncomplicated: Secondary | ICD-10-CM | POA: Diagnosis present

## 2012-10-24 DIAGNOSIS — I2510 Atherosclerotic heart disease of native coronary artery without angina pectoris: Secondary | ICD-10-CM | POA: Diagnosis present

## 2012-10-24 DIAGNOSIS — R471 Dysarthria and anarthria: Secondary | ICD-10-CM | POA: Diagnosis present

## 2012-10-24 DIAGNOSIS — R279 Unspecified lack of coordination: Secondary | ICD-10-CM | POA: Diagnosis present

## 2012-10-24 DIAGNOSIS — M1710 Unilateral primary osteoarthritis, unspecified knee: Secondary | ICD-10-CM | POA: Diagnosis present

## 2012-10-24 DIAGNOSIS — E119 Type 2 diabetes mellitus without complications: Secondary | ICD-10-CM | POA: Diagnosis present

## 2012-10-24 DIAGNOSIS — M179 Osteoarthritis of knee, unspecified: Secondary | ICD-10-CM | POA: Diagnosis present

## 2012-10-24 DIAGNOSIS — Z79899 Other long term (current) drug therapy: Secondary | ICD-10-CM

## 2012-10-24 DIAGNOSIS — I614 Nontraumatic intracerebral hemorrhage in cerebellum: Secondary | ICD-10-CM

## 2012-10-24 DIAGNOSIS — R29898 Other symptoms and signs involving the musculoskeletal system: Secondary | ICD-10-CM | POA: Diagnosis present

## 2012-10-24 DIAGNOSIS — Z880 Allergy status to penicillin: Secondary | ICD-10-CM

## 2012-10-24 DIAGNOSIS — F17200 Nicotine dependence, unspecified, uncomplicated: Secondary | ICD-10-CM | POA: Diagnosis present

## 2012-10-24 DIAGNOSIS — Z9861 Coronary angioplasty status: Secondary | ICD-10-CM

## 2012-10-24 DIAGNOSIS — R1310 Dysphagia, unspecified: Secondary | ICD-10-CM | POA: Diagnosis present

## 2012-10-24 DIAGNOSIS — I619 Nontraumatic intracerebral hemorrhage, unspecified: Principal | ICD-10-CM | POA: Diagnosis present

## 2012-10-24 LAB — POCT I-STAT, CHEM 8
Chloride: 105 mEq/L (ref 96–112)
Glucose, Bld: 215 mg/dL — ABNORMAL HIGH (ref 70–99)
HCT: 48 % (ref 39.0–52.0)
Potassium: 3.3 mEq/L — ABNORMAL LOW (ref 3.5–5.1)
Sodium: 139 mEq/L (ref 135–145)

## 2012-10-24 LAB — CBC
Hemoglobin: 15.8 g/dL (ref 13.0–17.0)
MCH: 33.5 pg (ref 26.0–34.0)
MCV: 92.4 fL (ref 78.0–100.0)
RBC: 4.72 MIL/uL (ref 4.22–5.81)

## 2012-10-24 LAB — ETHANOL: Alcohol, Ethyl (B): 23 mg/dL — ABNORMAL HIGH (ref 0–11)

## 2012-10-24 LAB — POCT I-STAT TROPONIN I

## 2012-10-24 NOTE — ED Provider Notes (Addendum)
History     CSN: [id number]  Arrival date & time 10/24/12  2327   None     No chief complaint on file.   (Consider location/radiation/quality/duration/timing/severity/associated sxs/prior treatment) Patient is a 78 y.o. male presenting with Acute Neurological Problem. The history is provided by the patient and the EMS personnel. The history is limited by the condition of the patient.  Cerebrovascular Accident This is a new problem. Episode onset: 930. The problem occurs constantly. The problem has been rapidly worsening. Associated symptoms include headaches. Nothing aggravates the symptoms. Nothing relieves the symptoms.    Past Medical History  Diagnosis Date  . Hypertension   . Diabetes mellitus     Past Surgical History  Procedure Laterality Date  . Heart stents  x3  . Cardiac surgery    . Hernia repair    . Rotator cuff repair      No family history on file.  History  Substance Use Topics  . Smoking status: Current Every Day Smoker  . Smokeless tobacco: Not on file  . Alcohol Use: Yes      Review of Systems  Unable to perform ROS: Mental status change  Neurological: Positive for headaches.    Allergies  Penicillins  Home Medications   Current Outpatient Rx  Name  Route  Sig  Dispense  Refill  . benazepril (LOTENSIN) 40 MG tablet   Oral   Take 40 mg by mouth daily.         Marland Kitchen GLIPIZIDE PO   Oral   Take 10 mg by mouth daily.          . insulin NPH-insulin regular (NOVOLIN 70/30) (70-30) 100 UNIT/ML injection   Subcutaneous   Inject 15 Units into the skin as needed. Per sliding scale         . metFORMIN (GLUCOPHAGE) 1000 MG tablet   Oral   Take 1,000 mg by mouth daily.            There were no vitals taken for this visit.  Physical Exam  Constitutional: He appears well-developed and well-nourished.  HENT:  Head: Normocephalic and atraumatic.  Eyes: Conjunctivae are normal. Pupils are equal, round, and reactive to light.  bilat  arcus senilus  Neck: Normal range of motion. Neck supple.  Cardiovascular: Normal rate, regular rhythm, normal heart sounds and intact distal pulses.   Pulmonary/Chest: Effort normal and breath sounds normal.  Abdominal: Soft. Bowel sounds are normal.  Neurological:  Opens eyes to questions.  Slightly slurred speech.  Moves all four extremities.  Alert to person.  Not alert to place, time  Skin: Skin is warm and dry.  Psychiatric: He has a normal mood and affect. His behavior is normal. Judgment and thought content normal.    ED Course  Procedures (including critical care time)  Labs Reviewed  CBC - Abnormal; Notable for the following:    MCHC 36.2 (*)    All other components within normal limits  POCT I-STAT, CHEM 8 - Abnormal; Notable for the following:    Potassium 3.3 (*)    Glucose, Bld 215 (*)    Calcium, Ion 1.06 (*)    All other components within normal limits  PROTIME-INR  APTT  ETHANOL  URINALYSIS, ROUTINE W REFLEX MICROSCOPIC   Ct Head Wo Contrast  10/24/2012  *RADIOLOGY REPORT*  Clinical Data: Altered mental status.  CT HEAD WITHOUT CONTRAST  Technique:  Contiguous axial images were obtained from the base of the skull through the vertex  without contrast.  Comparison: 02/09/2012.  Findings: There is a large cerebellar hematoma which is fairly midline but does extend asymmetric to the right.  It could be a hemorrhagic infarct, hypertensive hemorrhage or post-traumatic hematoma.  There is mild mass effect on the fourth ventricle.  No hydrocephalus.  There may be a small amount of blood on the right tentorium.  The bony structures are intact.  The paranasal sinuses and mastoid air cells are clear.  IMPRESSION: 5 x 3.5 cm cerebellar hematoma with mild mass effect on the fourth ventricle but no hydrocephalus.   Original Report Authenticated By: Rudie Meyer, M.D.      1. Intracranial hemorrhage    CRITICAL CARE Performed by: Rosanne Ashing   Total critical care time:   Critical care time was exclusive of separately billable procedures and treating other patients.  Critical care was necessary to treat or prevent imminent or life-threatening deterioration.  Critical care was time spent personally by me on the following activities: development of treatment plan with patient and/or surrogate as well as nursing, discussions with consultants, evaluation of patient's response to treatment, examination of patient, obtaining history from patient or surrogate, ordering and performing treatments and interventions, ordering and review of laboratory studies, ordering and review of radiographic studies, pulse oximetry and re-evaluation of patient's condition.   Date: 10/25/2012  Rate: 80  Rhythm: normal sinus rhythm  QRS Axis: left  Intervals: normal  ST/T Wave abnormalities: nonspecific ST changes  Conduction Disutrbances:none and multiple pvcs  Narrative Interpretation:   Old EKG Reviewed: changes noted   MDM  + hemorragic cva.  Discussed with radiology, and stroke hospitalist.  Will admit to icu,  Reasses.  Per ems,  Pt at 930 vomited after drinking 3 cognac,  And became increasingly altered.         Trey Gulbranson Lytle Michaels, MD 10/24/12 1610  Rosanne Ashing, MD 10/25/12 9604

## 2012-10-25 ENCOUNTER — Inpatient Hospital Stay (HOSPITAL_COMMUNITY): Payer: Medicare Other | Admitting: Anesthesiology

## 2012-10-25 ENCOUNTER — Inpatient Hospital Stay (HOSPITAL_COMMUNITY): Payer: Medicare Other

## 2012-10-25 ENCOUNTER — Encounter (HOSPITAL_COMMUNITY): Payer: Self-pay | Admitting: *Deleted

## 2012-10-25 ENCOUNTER — Encounter (HOSPITAL_COMMUNITY)
Admission: EM | Disposition: A | Discharge status: Rehabilitation Facility | Payer: Self-pay | Source: Home / Self Care | Attending: Neurology

## 2012-10-25 ENCOUNTER — Encounter (HOSPITAL_COMMUNITY): Payer: Self-pay | Admitting: Anesthesiology

## 2012-10-25 DIAGNOSIS — I10 Essential (primary) hypertension: Secondary | ICD-10-CM | POA: Diagnosis present

## 2012-10-25 DIAGNOSIS — I629 Nontraumatic intracranial hemorrhage, unspecified: Secondary | ICD-10-CM

## 2012-10-25 DIAGNOSIS — E119 Type 2 diabetes mellitus without complications: Secondary | ICD-10-CM | POA: Diagnosis present

## 2012-10-25 HISTORY — PX: CRANIECTOMY: SHX331

## 2012-10-25 LAB — URINALYSIS, ROUTINE W REFLEX MICROSCOPIC
Bilirubin Urine: NEGATIVE
Nitrite: NEGATIVE
Specific Gravity, Urine: 1.019 (ref 1.005–1.030)
pH: 5 (ref 5.0–8.0)

## 2012-10-25 LAB — HEMOGLOBIN A1C
Hgb A1c MFr Bld: 6.7 % — ABNORMAL HIGH (ref <5.7)
Mean Plasma Glucose: 146 mg/dL — ABNORMAL HIGH (ref <117)

## 2012-10-25 LAB — ABO/RH: ABO/RH(D): O POS

## 2012-10-25 LAB — GLUCOSE, CAPILLARY
Glucose-Capillary: 255 mg/dL — ABNORMAL HIGH (ref 70–99)
Glucose-Capillary: 174 mg/dL — ABNORMAL HIGH (ref 70–99)

## 2012-10-25 LAB — URINE MICROSCOPIC-ADD ON

## 2012-10-25 LAB — TROPONIN I: Troponin I: <0.3 ng/mL (ref <0.30)

## 2012-10-25 LAB — MRSA PCR SCREENING: MRSA by PCR: NEGATIVE

## 2012-10-25 LAB — TYPE AND SCREEN

## 2012-10-25 SURGERY — CRANIECTOMY POSTERIOR FOSSA DECOMPRESSION
Anesthesia: General | Wound class: Clean

## 2012-10-25 MED ORDER — BENAZEPRIL HCL 40 MG PO TABS
40.0000 mg | ORAL_TABLET | Freq: Every day | ORAL | Status: DC
  Administered 2012-10-27 – 2012-10-28 (×2): 40 mg via ORAL
  Filled 2012-10-25 (×4): qty 1

## 2012-10-25 MED ORDER — SODIUM CHLORIDE 0.9 % IV SOLN
INTRAVENOUS | Status: DC
  Administered 2012-10-25: 12:00:00 via INTRAVENOUS
  Administered 2012-10-25: 75 mL/h via INTRAVENOUS
  Administered 2012-10-25 – 2012-10-28 (×5): via INTRAVENOUS

## 2012-10-25 MED ORDER — OXYCODONE HCL 5 MG/5ML PO SOLN: 5.0000 mg | Freq: Once | ORAL | Status: DC | PRN

## 2012-10-25 MED ORDER — ONDANSETRON HCL 4 MG/2ML IJ SOLN
4.0000 mg | Freq: Four times a day (QID) | INTRAMUSCULAR | Status: DC | PRN
  Administered 2012-10-25: 4 mg via INTRAVENOUS
  Filled 2012-10-25: qty 2

## 2012-10-25 MED ORDER — VANCOMYCIN HCL 10 G IV SOLR
1500.0000 mg | INTRAVENOUS | Status: DC
  Filled 2012-10-25 (×2): qty 1500

## 2012-10-25 MED ORDER — THIAMINE HCL 100 MG/ML IJ SOLN
100.0000 mg | Freq: Every day | INTRAMUSCULAR | Status: DC
  Administered 2012-10-25 – 2012-10-27 (×4): 100 mg via INTRAVENOUS
  Filled 2012-10-25 (×2): qty 1
  Filled 2012-10-25: qty 2
  Filled 2012-10-25: qty 1

## 2012-10-25 MED ORDER — ADULT MULTIVITAMIN W/MINERALS CH
1.0000 | ORAL_TABLET | Freq: Every day | ORAL | Status: DC
  Administered 2012-10-27 – 2012-10-28 (×2): 1 via ORAL
  Filled 2012-10-25 (×4): qty 1

## 2012-10-25 MED ORDER — SODIUM CHLORIDE 0.9 % IV SOLN
INTRAVENOUS | Status: DC | PRN
  Administered 2012-10-25: 15:00:00 via INTRAVENOUS

## 2012-10-25 MED ORDER — DEXMEDETOMIDINE HCL IN NACL 200 MCG/50ML IV SOLN
0.4000 ug/kg/h | INTRAVENOUS | Status: DC
  Administered 2012-10-25 – 2012-10-26 (×3): 0.4 ug/kg/h via INTRAVENOUS
  Filled 2012-10-25: qty 50

## 2012-10-25 MED ORDER — PANTOPRAZOLE SODIUM 40 MG IV SOLR
40.0000 mg | Freq: Every day | INTRAVENOUS | Status: DC
  Administered 2012-10-25 – 2012-10-26 (×3): 40 mg via INTRAVENOUS
  Filled 2012-10-25 (×5): qty 40

## 2012-10-25 MED ORDER — ONDANSETRON HCL 4 MG/2ML IJ SOLN: 4.0000 mg | Freq: Once | INTRAMUSCULAR | Status: DC | PRN

## 2012-10-25 MED ORDER — METFORMIN HCL 500 MG PO TABS
1000.0000 mg | ORAL_TABLET | Freq: Every day | ORAL | Status: DC
  Administered 2012-10-28: 1000 mg via ORAL
  Filled 2012-10-25 (×4): qty 2

## 2012-10-25 MED ORDER — PHENYLEPHRINE HCL 10 MG/ML IJ SOLN
10.0000 mg | INTRAMUSCULAR | Status: DC | PRN
  Administered 2012-10-25: 20 ug/min via INTRAVENOUS

## 2012-10-25 MED ORDER — SODIUM CHLORIDE 0.9 % IV SOLN
INTRAVENOUS | Status: AC
  Filled 2012-10-25: qty 500

## 2012-10-25 MED ORDER — THROMBIN 20000 UNITS EX KIT
PACK | CUTANEOUS | Status: DC | PRN
  Administered 2012-10-25: 20000 [IU] via TOPICAL

## 2012-10-25 MED ORDER — HYDROMORPHONE HCL PF 1 MG/ML IJ SOLN: 0.2500 mg | INTRAMUSCULAR | Status: DC | PRN

## 2012-10-25 MED ORDER — INSULIN ASPART 100 UNIT/ML ~~LOC~~ SOLN
2.0000 [IU] | SUBCUTANEOUS | Status: DC
  Administered 2012-10-25: 6 [IU] via SUBCUTANEOUS
  Administered 2012-10-25: 4 [IU] via SUBCUTANEOUS
  Administered 2012-10-25: 6 [IU] via SUBCUTANEOUS

## 2012-10-25 MED ORDER — NEOSTIGMINE METHYLSULFATE 1 MG/ML IJ SOLN
INTRAMUSCULAR | Status: DC | PRN
  Administered 2012-10-25: 5 mg via INTRAVENOUS

## 2012-10-25 MED ORDER — BACITRACIN 50000 UNITS IM SOLR
INTRAMUSCULAR | Status: AC
  Filled 2012-10-25: qty 1

## 2012-10-25 MED ORDER — OXYCODONE HCL 5 MG PO TABS: 5.0000 mg | ORAL_TABLET | Freq: Once | ORAL | Status: DC | PRN

## 2012-10-25 MED ORDER — SUFENTANIL CITRATE 50 MCG/ML IV SOLN
INTRAVENOUS | Status: DC | PRN
  Administered 2012-10-25: 50 ug via INTRAVENOUS

## 2012-10-25 MED ORDER — LORAZEPAM 2 MG/ML IJ SOLN
1.0000 mg | Freq: Four times a day (QID) | INTRAMUSCULAR | Status: AC | PRN
  Administered 2012-10-25: 1 mg via INTRAVENOUS
  Filled 2012-10-25 (×2): qty 1

## 2012-10-25 MED ORDER — LABETALOL HCL 5 MG/ML IV SOLN
INTRAVENOUS | Status: DC | PRN
  Administered 2012-10-25: 20 mg via INTRAVENOUS
  Administered 2012-10-25 (×2): 10 mg via INTRAVENOUS
  Administered 2012-10-25: 20 mg via INTRAVENOUS

## 2012-10-25 MED ORDER — MEPERIDINE HCL 25 MG/ML IJ SOLN: 6.2500 mg | INTRAMUSCULAR | Status: DC | PRN

## 2012-10-25 MED ORDER — LIDOCAINE HCL (CARDIAC) 20 MG/ML IV SOLN
INTRAVENOUS | Status: DC | PRN
  Administered 2012-10-25: 100 mg via INTRAVENOUS

## 2012-10-25 MED ORDER — PROPOFOL 10 MG/ML IV BOLUS
INTRAVENOUS | Status: DC | PRN
  Administered 2012-10-25: 110 mg via INTRAVENOUS

## 2012-10-25 MED ORDER — ACETAMINOPHEN 650 MG RE SUPP
650.0000 mg | RECTAL | Status: DC | PRN
  Administered 2012-10-26 (×2): 650 mg via RECTAL
  Filled 2012-10-25 (×2): qty 1

## 2012-10-25 MED ORDER — BACITRACIN ZINC 500 UNIT/GM EX OINT
TOPICAL_OINTMENT | CUTANEOUS | Status: DC | PRN
  Administered 2012-10-25: 1 via TOPICAL

## 2012-10-25 MED ORDER — FOLIC ACID 1 MG PO TABS
1.0000 mg | ORAL_TABLET | Freq: Every day | ORAL | Status: DC
  Administered 2012-10-27 – 2012-10-28 (×2): 1 mg via ORAL
  Filled 2012-10-25 (×4): qty 1

## 2012-10-25 MED ORDER — ONDANSETRON HCL 4 MG/2ML IJ SOLN
INTRAMUSCULAR | Status: DC | PRN
  Administered 2012-10-25: 4 mg via INTRAVENOUS

## 2012-10-25 MED ORDER — LIDOCAINE HCL 4 % MT SOLN
OROMUCOSAL | Status: DC | PRN
  Administered 2012-10-25: 4 mL via TOPICAL

## 2012-10-25 MED ORDER — DEXMEDETOMIDINE HCL IN NACL 200 MCG/50ML IV SOLN
0.4000 ug/kg/h | INTRAVENOUS | Status: DC
  Administered 2012-10-25: 0.478 ug/kg/h via INTRAVENOUS
  Filled 2012-10-25: qty 50

## 2012-10-25 MED ORDER — ACETAMINOPHEN 325 MG PO TABS
650.0000 mg | ORAL_TABLET | ORAL | Status: DC | PRN
  Administered 2012-10-25 – 2012-10-27 (×3): 650 mg via ORAL
  Filled 2012-10-25 (×3): qty 2

## 2012-10-25 MED ORDER — LABETALOL HCL 5 MG/ML IV SOLN
10.0000 mg | INTRAVENOUS | Status: DC | PRN
  Administered 2012-10-25 (×2): 20 mg via INTRAVENOUS
  Filled 2012-10-25 (×2): qty 4

## 2012-10-25 MED ORDER — SENNOSIDES-DOCUSATE SODIUM 8.6-50 MG PO TABS
1.0000 | ORAL_TABLET | Freq: Two times a day (BID) | ORAL | Status: DC
  Administered 2012-10-27 – 2012-10-28 (×3): 1 via ORAL
  Filled 2012-10-25 (×8): qty 1

## 2012-10-25 MED ORDER — LORAZEPAM 1 MG PO TABS: 1.0000 mg | ORAL_TABLET | Freq: Four times a day (QID) | ORAL | Status: AC | PRN

## 2012-10-25 MED ORDER — METFORMIN HCL 500 MG PO TABS: 1000.0000 mg | ORAL_TABLET | Freq: Every day | ORAL | Status: DC

## 2012-10-25 MED ORDER — SODIUM CHLORIDE 0.9 % IR SOLN
Status: DC | PRN
  Administered 2012-10-25 (×4): 1000 mL

## 2012-10-25 MED ORDER — ROCURONIUM BROMIDE 100 MG/10ML IV SOLN
INTRAVENOUS | Status: DC | PRN
  Administered 2012-10-25 (×2): 20 mg via INTRAVENOUS
  Administered 2012-10-25: 50 mg via INTRAVENOUS

## 2012-10-25 MED ORDER — GLIPIZIDE 10 MG PO TABS
10.0000 mg | ORAL_TABLET | Freq: Every day | ORAL | Status: DC
  Administered 2012-10-28: 10 mg via ORAL
  Filled 2012-10-25 (×4): qty 1

## 2012-10-25 MED ORDER — BUPIVACAINE HCL (PF) 0.5 % IJ SOLN
INTRAMUSCULAR | Status: DC | PRN
  Administered 2012-10-25: 5 mL

## 2012-10-25 MED ORDER — GLYCOPYRROLATE 0.2 MG/ML IJ SOLN
INTRAMUSCULAR | Status: DC | PRN
  Administered 2012-10-25: .6 mg via INTRAVENOUS

## 2012-10-25 MED ORDER — INSULIN ASPART 100 UNIT/ML ~~LOC~~ SOLN
0.0000 [IU] | SUBCUTANEOUS | Status: DC
  Administered 2012-10-25: 5 [IU] via SUBCUTANEOUS
  Administered 2012-10-26: 3 [IU] via SUBCUTANEOUS
  Administered 2012-10-26 (×3): 2 [IU] via SUBCUTANEOUS
  Administered 2012-10-27: 3 [IU] via SUBCUTANEOUS
  Administered 2012-10-27 – 2012-10-28 (×5): 2 [IU] via SUBCUTANEOUS
  Administered 2012-10-28: 3 [IU] via SUBCUTANEOUS
  Administered 2012-10-28: 2 [IU] via SUBCUTANEOUS
  Administered 2012-10-28: 5 [IU] via SUBCUTANEOUS

## 2012-10-25 MED ORDER — LIDOCAINE-EPINEPHRINE 1 %-1:100000 IJ SOLN
INTRAMUSCULAR | Status: DC | PRN
  Administered 2012-10-25: 5 mL via INTRADERMAL

## 2012-10-25 MED ORDER — VANCOMYCIN HCL 1000 MG IV SOLR
1000.0000 mg | INTRAVENOUS | Status: DC | PRN
  Administered 2012-10-25: 1500 mg via INTRAVENOUS

## 2012-10-25 MED ORDER — VITAMIN B-1 100 MG PO TABS: 100.0000 mg | ORAL_TABLET | Freq: Every day | ORAL | Status: DC

## 2012-10-25 SURGICAL SUPPLY — 78 items
BANDAGE GAUZE 4  KLING STR (GAUZE/BANDAGES/DRESSINGS) IMPLANT
BANDAGE GAUZE ELAST BULKY 4 IN (GAUZE/BANDAGES/DRESSINGS) IMPLANT
BLADE SURG ROTATE 9660 (MISCELLANEOUS) IMPLANT
BRUSH SCRUB EZ PLAIN DRY (MISCELLANEOUS) ×2 IMPLANT
BUR PRECISION FLUTE 5.0 (BURR) ×2 IMPLANT
CANISTER SUCTION 2500CC (MISCELLANEOUS) ×4 IMPLANT
CLIP TI MEDIUM 6 (CLIP) ×2 IMPLANT
CLOTH BEACON ORANGE TIMEOUT ST (SAFETY) ×2 IMPLANT
CONT SPEC 4OZ CLIKSEAL STRL BL (MISCELLANEOUS) ×2 IMPLANT
CORDS BIPOLAR (ELECTRODE) ×2 IMPLANT
COVER MAYO STAND STRL (DRAPES) IMPLANT
DRAIN SNY WOU 7FLT (WOUND CARE) IMPLANT
DRAPE MICROSCOPE LEICA (MISCELLANEOUS) ×2 IMPLANT
DRAPE NEUROLOGICAL W/INCISE (DRAPES) ×2 IMPLANT
DRAPE WARM FLUID 44X44 (DRAPE) ×2 IMPLANT
DRESSING TELFA 8X3 (GAUZE/BANDAGES/DRESSINGS) ×2 IMPLANT
DRSG ADAPTIC 3X8 NADH LF (GAUZE/BANDAGES/DRESSINGS) IMPLANT
DRSG OPSITE 4X5.5 SM (GAUZE/BANDAGES/DRESSINGS) ×6 IMPLANT
DURAMATRIX ONLAY 2X2 (Neuro Prosthesis/Implant) ×2 IMPLANT
ELECT CAUTERY BLADE 6.4 (BLADE) ×2 IMPLANT
ELECT REM PT RETURN 9FT ADLT (ELECTROSURGICAL) ×2
ELECTRODE REM PT RTRN 9FT ADLT (ELECTROSURGICAL) ×1 IMPLANT
EVACUATOR 1/8 PVC DRAIN (DRAIN) IMPLANT
EVACUATOR SILICONE 100CC (DRAIN) IMPLANT
GAUZE SPONGE 4X4 16PLY XRAY LF (GAUZE/BANDAGES/DRESSINGS) IMPLANT
GAUZE VASELINE 1X8 (GAUZE/BANDAGES/DRESSINGS) IMPLANT
GLOVE BIO SURGEON STRL SZ8 (GLOVE) ×2 IMPLANT
GLOVE BIOGEL PI IND STRL 7.0 (GLOVE) ×1 IMPLANT
GLOVE BIOGEL PI IND STRL 8 (GLOVE) ×1 IMPLANT
GLOVE BIOGEL PI IND STRL 8.5 (GLOVE) ×1 IMPLANT
GLOVE BIOGEL PI INDICATOR 7.0 (GLOVE) ×1
GLOVE BIOGEL PI INDICATOR 8 (GLOVE) ×1
GLOVE BIOGEL PI INDICATOR 8.5 (GLOVE) ×1
GLOVE ECLIPSE 7.5 STRL STRAW (GLOVE) ×2 IMPLANT
GLOVE ECLIPSE 8.0 STRL XLNG CF (GLOVE) ×2 IMPLANT
GLOVE EXAM NITRILE LRG STRL (GLOVE) IMPLANT
GLOVE EXAM NITRILE MD LF STRL (GLOVE) IMPLANT
GLOVE EXAM NITRILE XL STR (GLOVE) IMPLANT
GLOVE EXAM NITRILE XS STR PU (GLOVE) IMPLANT
GLOVE SURG SS PI 7.0 STRL IVOR (GLOVE) ×2 IMPLANT
GOWN BRE IMP SLV AUR LG STRL (GOWN DISPOSABLE) IMPLANT
GOWN BRE IMP SLV AUR XL STRL (GOWN DISPOSABLE) IMPLANT
GOWN STRL REIN 2XL LVL4 (GOWN DISPOSABLE) IMPLANT
HEMOSTAT SURGICEL 2X14 (HEMOSTASIS) ×2 IMPLANT
KIT BASIN OR (CUSTOM PROCEDURE TRAY) ×2 IMPLANT
KIT ROOM TURNOVER OR (KITS) ×2 IMPLANT
MARKER SPHERE PSV REFLC NDI (MISCELLANEOUS) IMPLANT
NEEDLE SPNL 22GX3.5 QUINCKE BK (NEEDLE) ×2 IMPLANT
NS IRRIG 1000ML POUR BTL (IV SOLUTION) ×2 IMPLANT
PACK CRANIOTOMY (CUSTOM PROCEDURE TRAY) ×2 IMPLANT
PAD ARMBOARD 7.5X6 YLW CONV (MISCELLANEOUS) ×6 IMPLANT
PATTIES SURGICAL .25X.25 (GAUZE/BANDAGES/DRESSINGS) IMPLANT
PATTIES SURGICAL .5 X.5 (GAUZE/BANDAGES/DRESSINGS) IMPLANT
PATTIES SURGICAL .5 X3 (DISPOSABLE) IMPLANT
PATTIES SURGICAL 1/4 X 3 (GAUZE/BANDAGES/DRESSINGS) ×2 IMPLANT
PATTIES SURGICAL 1X1 (DISPOSABLE) IMPLANT
PATTIES SURGICAL 3 X3 (GAUZE/BANDAGES/DRESSINGS)
PATTIES SURGICAL 3X3 (GAUZE/BANDAGES/DRESSINGS) IMPLANT
PIN MAYFIELD SKULL DISP (PIN) IMPLANT
SPECIMEN JAR SMALL (MISCELLANEOUS) IMPLANT
SPONGE GAUZE 4X4 12PLY (GAUZE/BANDAGES/DRESSINGS) ×2 IMPLANT
SPONGE NEURO XRAY DETECT 1X3 (DISPOSABLE) IMPLANT
SPONGE SURGIFOAM ABS GEL 100 (HEMOSTASIS) IMPLANT
STAPLER SKIN PROX WIDE 3.9 (STAPLE) ×2 IMPLANT
SUT BONE WAX W31G (SUTURE) IMPLANT
SUT NURALON 4 0 TR CR/8 (SUTURE) ×4 IMPLANT
SUT VIC AB 0 CT1 18XCR BRD8 (SUTURE) ×1 IMPLANT
SUT VIC AB 0 CT1 8-18 (SUTURE) ×1
SUT VIC AB 2-0 CP2 18 (SUTURE) ×2 IMPLANT
SYR 20ML ECCENTRIC (SYRINGE) ×2 IMPLANT
SYR CONTROL 10ML LL (SYRINGE) ×2 IMPLANT
TIP ISOCOOL LP 1.0MM (INSTRUMENTS) ×2 IMPLANT
TIP SONASTAR STD MISONIX 1.9 (TRAY / TRAY PROCEDURE) IMPLANT
TOWEL OR 17X24 6PK STRL BLUE (TOWEL DISPOSABLE) ×2 IMPLANT
TOWEL OR 17X26 10 PK STRL BLUE (TOWEL DISPOSABLE) ×2 IMPLANT
TRAY FOLEY CATH 14FRSI W/METER (CATHETERS) IMPLANT
UNDERPAD 30X30 INCONTINENT (UNDERPADS AND DIAPERS) IMPLANT
WATER STERILE IRR 1000ML POUR (IV SOLUTION) ×2 IMPLANT

## 2012-10-25 NOTE — ED Notes (Signed)
Per EMS: pt coming from home with c/o stroke symptoms. Pt went to the bathroom, threw up, felt dizzy, family noticed new onset of slurred speech. Pt reports headache, dizziness. CBG 173. Equal grips, dysphagic, skin warm and dry, respirations equal unlabored, A&O to name, birthday, place

## 2012-10-25 NOTE — Code Documentation (Signed)
78 yo bm brought in via [facility] for sudden onset slurred speech.  Per family report pt had a normal amount of cognac & went into the bathroom & vomited.  When he walked out of the bathroom he developed slurred speech.  Code stroke called 2319, pt arrival 2327, EDP exam 2329, stroke team arrival 2319, LSW 2145, pt arrival in CT 2331, phlebotomist arrival 2329. NIH 6 for ataxia, slurred speech, inability to answer questions. Pt admitted by stroke MD, plan admit to NSICU.

## 2012-10-25 NOTE — Anesthesia Postprocedure Evaluation (Signed)
  Anesthesia Post-op Note  Patient: Bill Jackson  Procedure(s) Performed: Procedure(s) with comments: Suboccipital Craniectomy for Evacuation of Cerebellar Hematoma (N/A) - Suboccipital Craniectomy for Evacuation of Cerebellar Hematoma  Patient Location: PACU  Anesthesia Type:General  Level of Consciousness: pateint uncooperative and confused  Airway and Oxygen Therapy: Patient Spontanous Breathing and Patient connected to nasal cannula oxygen  Post-op Pain: none  Post-op Assessment: Post-op Vital signs reviewed, Patient's Cardiovascular Status Stable, Respiratory Function Stable and Patent Airway  Post-op Vital Signs: stable  Complications: No apparent anesthesia complications

## 2012-10-25 NOTE — ED Notes (Signed)
Attempted to call report. Floor RN unable to accept report.  

## 2012-10-25 NOTE — Anesthesia Procedure Notes (Signed)
Procedure Name: Intubation Date/Time: 10/25/2012 3:32 PM Performed by: Coralee Rud Pre-anesthesia Checklist: Patient identified, Emergency Drugs available, Suction available, Patient being monitored and Timeout performed Patient Re-evaluated:Patient Re-evaluated prior to inductionOxygen Delivery Method: Circle system utilized Preoxygenation: Pre-oxygenation with 100% oxygen Intubation Type: IV induction Ventilation: Mask ventilation without difficulty Laryngoscope Size: Miller and 3 Grade View: Grade I Tube type: Oral Tube size: 8.0 mm Number of attempts: 1 Airway Equipment and Method: Stylet and LTA kit utilized Placement Confirmation: ETT inserted through vocal cords under direct vision,  breath sounds checked- equal and bilateral and positive ETCO2 Secured at: 23 cm Tube secured with: Tape Dental Injury: Teeth and Oropharynx as per pre-operative assessment

## 2012-10-25 NOTE — Progress Notes (Addendum)
Stroke Team Progress Note  HISTORY Bill Jackson is an 78 y.o. male history of hypertension and diabetes mellitus as well as alcohol abuse, presenting 10/24/2012 with acute onset of nausea and vomiting and complained of dizziness as well as ataxia. He was last known well at 2145 on 10/24/2012. There is no previous history of stroke nor TIA. He has not been on antiplatelet therapy no anticoagulation. CT scan of the head showed a 5 x 3.5 cm acute cerebellar hemorrhage with mild mass effect on fourth ventricle. Fourth ventricle is patent and patient had no signs of hydrocephalus. Patient was not a TPA candidate secondary to hemorrhage. He was admitted to the neuro ICU for further evaluation and treatment.  SUBJECTIVE His RN is at the bedside, no family.  Overall he feels his condition is critical.   OBJECTIVE Most recent Vital Signs: Filed Vitals:   10/25/12 0500 10/25/12 0600 10/25/12 0700 10/25/12 0800  BP: 136/70 134/69 131/61 131/70  Pulse: 80 76 79 70  Temp: 99 F (37.2 C) 99.1 F (37.3 C) 99.1 F (37.3 C) 99 F (37.2 C)  TempSrc:      Resp: 19 21 22 18   Height:      Weight:      SpO2: 95% 95% 94% 96%   CBG (last 3)  No results found for this basename: GLUCAP,  in the last 72 hours  IV Fluid Intake:   . sodium chloride 75 mL/hr at 10/25/12 0800    MEDICATIONS  . folic acid  1 mg Oral Daily  . insulin aspart  2-6 Units Subcutaneous Q4H  . multivitamin with minerals  1 tablet Oral Daily  . pantoprazole (PROTONIX) IV  40 mg Intravenous QHS  . senna-docusate  1 tablet Oral BID  . thiamine  100 mg Intravenous Daily   PRN:  acetaminophen, acetaminophen, labetalol, LORazepam, LORazepam, ondansetron (ZOFRAN) IV  Diet:  NPO  Activity:  Bedrest DVT Prophylaxis:  SCDs   CLINICALLY SIGNIFICANT STUDIES Basic Metabolic Panel:   Recent Labs Lab 10/24/12 2340  NA 139  K 3.3*  CL 105  GLUCOSE 215*  BUN 15  CREATININE 1.10   Liver Function Tests: No results found for this  basename: AST, ALT, ALKPHOS, BILITOT, PROT, ALBUMIN,  in the last 168 hours CBC:   Recent Labs Lab 10/24/12 2327 10/24/12 2340  WBC 7.4  --   HGB 15.8 16.3  HCT 43.6 48.0  MCV 92.4  --   PLT 246  --    Coagulation:   Recent Labs Lab 10/24/12 2327  LABPROT 12.9  INR 0.98   Cardiac Enzymes:   Recent Labs Lab 10/25/12 0345 10/25/12 0700  TROPONINI <0.30 <0.30   Urinalysis:   Recent Labs Lab 10/25/12 0047  COLORURINE YELLOW  LABSPEC 1.019  PHURINE 5.0  GLUCOSEU >1000*  HGBUR TRACE*  BILIRUBINUR NEGATIVE  KETONESUR 15*  PROTEINUR NEGATIVE  UROBILINOGEN 0.2  NITRITE NEGATIVE  LEUKOCYTESUR NEGATIVE   Lipid Panel No results found for this basename: chol,  trig,  hdl,  cholhdl,  vldl,  ldlcalc   HgbA1C  No results found for this basename: HGBA1C    Urine Drug Screen:   No results found for this basename: labopia,  cocainscrnur,  labbenz,  amphetmu,  thcu,  labbarb    Alcohol Level:   Recent Labs Lab 10/24/12 2327  ETH 23*    CT of the brain 10/24/2012  5 x 3.5 cm cerebellar hematoma with mild mass effect on the fourth ventricle but no  hydrocephalus.    MRI of the brain    MRA of the brain    2D Echocardiogram    Carotid Doppler    CXR  10/25/2012  Low volume chest with probable pulmonary vascular congestion.   EKG  .   Therapy Recommendations   Physical Exam   Elderly African American male not in distress. . Afebrile. Head is nontraumatic. Neck is supple without bruit. Hearing is normal. Cardiac exam no murmur or gallop. Lungs are clear to auscultation. Distal pulses are well felt. Neurological Exam ; drowsy but opens eyes easily and follow simple commands. Pupils 3 mm equal reactive. Extraocular moments are full range but saccadic dysmetria   on either direction when looking laterally. Fundi could not be visualized. He has significant dysarthria with started to speech. No facial weakness. Tongue is midline. Cough and gag appear weak. He can move  all 4 extremities well against gravity but has significant right finger-to-nose dysmetria with almost a violent tremor when he approaches his nose. Mild proximal lower extremity weakness bilaterally. Deep tendon flexes are depressed. Plantars are downgoing. Sensation appears intact. Gait was not tested. ASSESSMENT Mr. Bill Jackson is a 78 y.o. male presenting with nausea, vomiting, dizziness and ataxia. Imaging confirms a midline large cerebellar hemorrhage with mass effect on the fourth ventricle. Hemorrhage felt to be secondary to accelerated Hypertension; highest BP 201/94 On no antiplatelets prior to admission. Patient with resultant right hand/arm severe dysmetria, scanning dysarthria, upgaze paralysis, lethargic. Given acute hemorrhage, patient at risk for hemorrhage, extension, cerebral edema, herniation, obstructive hydrocephalus over the next 24h. Work up underway.  Hypertension Diabetes, HgbA1c pending CAD - OHS, cardiac stents Cigarette smoker etoh use  Hospital day # 1  TREATMENT/PLAN  Continue ICU level care  CT head now and in am  Neurosurgery consult-spoke to Dr [name]- patient will likely need craniotomy for hematoma evacuation.  Keep pt NPO in the event surgery is needed  Continue bedrest. Delay therapies today  Check HgbA1c  This patient is critically ill and at significant risk of neurological worsening and death. Patient care requires constant monitoring of vital signs, hemodynamics, respiratory and cardiac monitoring, and neurological assessment. Discussion with family, other specialists about plan of care. Medical decision making of high complexity. Dr. Pearlean Brownie spent 30 minutes of neurocritical care time in the care of this patient.   Annie Main, MSN, RN, ANVP-BC, ANP-BC, [facility] Pager: [phone] 10/25/2012 8:44 AM  I have personally obtained a history, examined the patient, evaluated imaging results, and formulated the assessment and  plan of care. I agree with the above.  Delia Heady, MD

## 2012-10-25 NOTE — Progress Notes (Signed)
SLP Cancellation Note  Patient Details Name: Bill Jackson MRN: [id number] DOB: 1934-12-29   Cancelled treatment:       Reason Eval/Treat Not Completed: Other (comment) (Pt ordered NPO remainder of day 3/25) Will f/u at next date  Berdine Dance SLP student Berdine Dance 10/25/2012, 9:56 AM

## 2012-10-25 NOTE — Anesthesia Preprocedure Evaluation (Addendum)
Anesthesia Evaluation  Patient identified by MRN, date of birth, ID band Patient awake    Reviewed: Allergy & Precautions, H&P , NPO status , Patient's Chart, lab work & pertinent test results  Airway Mallampati: I TM Distance: >3 FB Neck ROM: Full    Dental   Pulmonary          Cardiovascular hypertension, Pt. on medications + CAD and + Cardiac Stents     Neuro/Psych    GI/Hepatic   Endo/Other  diabetes  Renal/GU      Musculoskeletal   Abdominal   Peds  Hematology   Anesthesia Other Findings   Reproductive/Obstetrics                          Anesthesia Physical Anesthesia Plan  ASA: III  Anesthesia Plan: General   Post-op Pain Management:    Induction: Intravenous  Airway Management Planned: Oral ETT  Additional Equipment: Arterial line  Intra-op Plan:   Post-operative Plan: Extubation in OR  Informed Consent: I have reviewed the patients History and Physical, chart, labs and discussed the procedure including the risks, benefits and alternatives for the proposed anesthesia with the patient or authorized representative who has indicated his/her understanding and acceptance.     Plan Discussed with: CRNA and Surgeon  Anesthesia Plan Comments:         Anesthesia Quick Evaluation

## 2012-10-25 NOTE — Consult Note (Signed)
Chief Complaint: Acute onset of nausea, vomiting and ataxia.  HPI: Bill Jackson is an 78 y.o. male history of hypertension and diabetes mellitus as well as alcohol abuse, presenting with acute onset of nausea and vomiting and complained of dizziness as well as ataxia. He was last known well at 2145 on 10/24/2012. There is no previous history of stroke nor TIA. He has not been on antiplatelet therapy no anticoagulation. CT scan of the head showed a 5 x 3.5 cm acute cerebellar hemorrhage with mild mass effect on fourth ventricle. Fourth ventricle is patent and patient had no signs of hydrocephalus.  LSN: [id number] on 10/24/2012  tPA Given: No: Acute cerebellar hemorrhage  MRankin: 3  Past Medical History   Diagnosis  Date   .  Hypertension    .  Diabetes mellitus     Past Surgical History   Procedure  Laterality  Date   .  Heart stents   x3   .  Cardiac surgery     .  Hernia repair     .  Rotator cuff repair      History reviewed. No pertinent family history.  Social History: reports that he has been smoking. He does not have any smokeless tobacco history on file. He reports that drinks alcohol. He reports that he does not use illicit drugs.  Allergies:  Allergies   Allergen  Reactions   .  Penicillins  Other (See Comments)     unknown    Medications:  Prior to admission:  Lotensin 40 mg per day  Glucotrol 10 mg per day  Novolin 70/3015 units as needed per sliding scale  Metformin 1000 mg daily  ROS:  Obtained from power of attorney. Review of systems was noncontributory except for patient's presenting acute neurologic symptoms tonight.  Physical Examination:  Blood pressure 160/82, pulse 78, resp. rate 18, SpO2 97.00%.  HEENT- Normocephalic, no lesions, without obvious abnormality. Normal external eye and conjunctiva. Normal TM's bilaterally. Normal auditory canals and external ears. Normal external nose, mucus membranes and septum. Normal pharynx.  Neck supple with no masses, nodes,  nodules or enlargement.  Cardiovascular - regular rate and rhythm, S1, S2 normal, no murmur, click, rub or gallop  Lungs - chest clear, no wheezing, rales, normal symmetric air entry, Heart exam - S1, S2 normal, no murmur, no gallop, rate regular  Abdomen - soft, non-tender; bowel sounds normal; no masses, no organomegaly  Extremities - no joint deformities, effusion, or inflammation and no edema  Neurologic Examination:  Mental Status:  Lethargic but could be aroused. Oriented to person and place but disoriented to the current month. Speech was severely dysarthric.  Cranial Nerves:  II-Visual fields were normal.  III/IV/VI-pupils were irregular and unequal in size, as well as eccentric. Neither pupil reacted to light. Extraocular movements were full and conjugate with no nystagmus.  V/VII-no facial numbness and no facial weakness.  VIII-normal.  X-speech was moderately dysarthric.  Motor: 5/5 bilaterally with normal tone and bulk  Sensory: Normal throughout.  Deep Tendon Reflexes: 1+ and symmetric.  Plantars: Mute bilaterally  Cerebellar: Marked coordination difficulty involving right arm and leg. Milder coordination difficulty left arm and leg. Carotid auscultation: Normal  Results for orders placed during the hospital encounter of 10/24/12 (from the past 48 hour(s))   PROTIME-INR Status: None    Collection Time    10/24/12 11:27 PM   Result  Value  Range    Prothrombin Time  12.9  11.6 - 15.2 seconds  INR  0.98  0.00 - 1.49   APTT Status: Abnormal    Collection Time    10/24/12 11:27 PM   Result  Value  Range    aPTT  23 (*)  24 - 37 seconds   CBC Status: Abnormal    Collection Time    10/24/12 11:27 PM   Result  Value  Range    WBC  7.4  4.0 - 10.5 K/uL    RBC  4.72  4.22 - 5.81 MIL/uL    Hemoglobin  15.8  13.0 - 17.0 g/dL    HCT  10.2  72.5 - 36.6 %    MCV  92.4  78.0 - 100.0 fL    MCH  33.5  26.0 - 34.0 pg    MCHC  36.2 (*)  30.0 - 36.0 g/dL    RDW  44.0  34.7 -  42.5 %    Platelets  246  150 - 400 K/uL   ETHANOL Status: Abnormal    Collection Time    10/24/12 11:27 PM   Result  Value  Range    Alcohol, Ethyl (B)  23 (*)  0 - 11 mg/dL    Comment:      LOWEST DETECTABLE LIMIT FOR     SERUM ALCOHOL IS 11 mg/dL     FOR MEDICAL PURPOSES ONLY   POCT I-STAT TROPONIN I Status: None    Collection Time    10/24/12 11:39 PM   Result  Value  Range    Troponin i, poc  0.00  0.00 - 0.08 ng/mL    Comment 3      Comment:  Due to the release kinetics of cTnI,     a negative result within the first hours     of the onset of symptoms does not rule out     myocardial infarction with certainty.     If myocardial infarction is still suspected,     repeat the test at appropriate intervals.   POCT I-STAT, CHEM 8 Status: Abnormal    Collection Time    10/24/12 11:40 PM   Result  Value  Range    Sodium  139  135 - 145 mEq/L    Potassium  3.3 (*)  3.5 - 5.1 mEq/L    Chloride  105  96 - 112 mEq/L    BUN  15  6 - 23 mg/dL    Creatinine, Ser  9.56  0.50 - 1.35 mg/dL    Glucose, Bld  387 (*)  70 - 99 mg/dL    Calcium, Ion  5.64 (*)  1.13 - 1.30 mmol/L    TCO2  20  0 - 100 mmol/L    Hemoglobin  16.3  13.0 - 17.0 g/dL    HCT  33.2  95.1 - 88.4 %    Ct Head Wo Contrast  10/24/2012 *RADIOLOGY REPORT* Clinical Data: Altered mental status. CT HEAD WITHOUT CONTRAST Technique: Contiguous axial images were obtained from the base of the skull through the vertex without contrast. Comparison: 02/09/2012. Findings: There is a large cerebellar hematoma which is fairly midline but does extend asymmetric to the right. It could be a hemorrhagic infarct, hypertensive hemorrhage or post-traumatic hematoma. There is mild mass effect on the fourth ventricle. No hydrocephalus. There may be a small amount of blood on the right tentorium. The bony structures are intact. The paranasal sinuses and mastoid air cells are clear. IMPRESSION: 5 x 3.5 cm cerebellar hematoma with mild mass  effect on  the fourth ventricle but no hydrocephalus. Original Report Authenticated By: Rudie Meyer, M.D.   Assessment and plan: 78 y.o. male presenting with acute large cerebellar hemorrhage involving right hemisphere and midline with increasing mass effect on the fourth ventricle. Follow up CT shows slight enlargement of cerebellar hemorrhage and intraventricular blood with enlarging ventricular system.  Because of the large size of the hemorrhage and risk of death, I have recommended to patient and his sister that it would be prudent to proceed with surgery on an urgent basis during the day, rather than waiting for him to deteriorate and proceed on an emergent basis.They agree with this plan and wish to proceed. Stroke Risk Factors - diabetes mellitus, family history and hypertension   Danae Orleans. [name], MD

## 2012-10-25 NOTE — Progress Notes (Signed)
Patient has woken up from surgery and becomes agitated, tries to sit up and get out of bed, says "I have to pee," and requiring restraint to stay in bed.  This is causing him to become hypertensive.  On discussion with Dr. Noreene Larsson, we will try to sedate with Precedex and see if we can control his agitation, but still have an exam we can follow.

## 2012-10-25 NOTE — Progress Notes (Signed)
SLP reviewed and agree with student findings.   Davaughn Hillyard MA, CCC-SLP [phone]    

## 2012-10-25 NOTE — H&P (Signed)
Admission H&P    Chief Complaint: Acute onset of nausea, vomiting and ataxia.  HPI: Bill Jackson is an 78 y.o. male history of hypertension and diabetes mellitus as well as alcohol abuse, presenting with acute onset of nausea and vomiting and complained of dizziness as well as ataxia. He was last known well at 2145 on 10/24/2012. There is no previous history of stroke nor TIA. He has not been on antiplatelet therapy no anticoagulation. CT scan of the head showed a 5 x 3.5 cm acute cerebellar hemorrhage with mild mass effect on fourth ventricle. Fourth ventricle is patent and patient had no signs of hydrocephalus.  LSN: [id number] on 10/24/2012 tPA Given: No: Acute cerebellar hemorrhage MRankin: 3  Past Medical History  Diagnosis Date  . Hypertension   . Diabetes mellitus     Past Surgical History  Procedure Laterality Date  . Heart stents  x3  . Cardiac surgery    . Hernia repair    . Rotator cuff repair      History reviewed. No pertinent family history. Social History:  reports that he has been smoking.  He does not have any smokeless tobacco history on file. He reports that  drinks alcohol. He reports that he does not use illicit drugs.  Allergies:  Allergies  Allergen Reactions  . Penicillins Other (See Comments)    unknown    Medications:  Prior to admission: Lotensin 40 mg per day Glucotrol 10 mg per day Novolin 70/3015 units as needed per sliding scale Metformin 1000 mg daily  ROS: Obtained from power of attorney. Review of systems was noncontributory except for patient's presenting acute neurologic symptoms tonight.  Physical Examination: Blood pressure 160/82, pulse 78, resp. rate 18, SpO2 97.00%.  HEENT-  Normocephalic, no lesions, without obvious abnormality.  Normal external eye and conjunctiva.  Normal TM's bilaterally.  Normal auditory canals and external ears. Normal external nose, mucus membranes and septum.  Normal pharynx. Neck supple with no masses,  nodes, nodules or enlargement. Cardiovascular - regular rate and rhythm, S1, S2 normal, no murmur, click, rub or gallop Lungs - chest clear, no wheezing, rales, normal symmetric air entry, Heart exam - S1, S2 normal, no murmur, no gallop, rate regular Abdomen - soft, non-tender; bowel sounds normal; no masses,  no organomegaly Extremities - no joint deformities, effusion, or inflammation and no edema  Neurologic Examination: Mental Status: Lethargic but could be aroused.  Oriented to person and place but disoriented to the current month. Speech was moderately dysarthric.  Cranial Nerves: II-Visual fields were normal. III/IV/VI-pupils were irregular and unequal in size, as well as eccentric. Neither pupil reacted to light. Extraocular movements were full and conjugate with no nystagmus.    V/VII-no facial numbness and no facial weakness. VIII-normal. X-speech was moderately dysarthric. Motor: 5/5 bilaterally with normal tone and bulk Sensory: Normal throughout. Deep Tendon Reflexes: 1+ and symmetric. Plantars: Mute bilaterally Cerebellar: Marked coordination difficulty involving right arm and leg. Carotid auscultation: Normal  Results for orders placed during the hospital encounter of 10/24/12 (from the past 48 hour(s))  PROTIME-INR     Status: None   Collection Time    10/24/12 11:27 PM      Result Value Range   Prothrombin Time 12.9  11.6 - 15.2 seconds   INR 0.98  0.00 - 1.49  APTT     Status: Abnormal   Collection Time    10/24/12 11:27 PM      Result Value Range   aPTT 23 (*)  24 - 37 seconds  CBC     Status: Abnormal   Collection Time    10/24/12 11:27 PM      Result Value Range   WBC 7.4  4.0 - 10.5 K/uL   RBC 4.72  4.22 - 5.81 MIL/uL   Hemoglobin 15.8  13.0 - 17.0 g/dL   HCT 45.4  09.8 - 11.9 %   MCV 92.4  78.0 - 100.0 fL   MCH 33.5  26.0 - 34.0 pg   MCHC 36.2 (*) 30.0 - 36.0 g/dL   RDW 14.7  82.9 - 56.2 %   Platelets 246  150 - 400 K/uL  ETHANOL     Status:  Abnormal   Collection Time    10/24/12 11:27 PM      Result Value Range   Alcohol, Ethyl (B) 23 (*) 0 - 11 mg/dL   Comment:            LOWEST DETECTABLE LIMIT FOR     SERUM ALCOHOL IS 11 mg/dL     FOR MEDICAL PURPOSES ONLY  POCT I-STAT TROPONIN I     Status: None   Collection Time    10/24/12 11:39 PM      Result Value Range   Troponin i, poc 0.00  0.00 - 0.08 ng/mL   Comment 3            Comment: Due to the release kinetics of cTnI,     a negative result within the first hours     of the onset of symptoms does not rule out     myocardial infarction with certainty.     If myocardial infarction is still suspected,     repeat the test at appropriate intervals.  POCT I-STAT, CHEM 8     Status: Abnormal   Collection Time    10/24/12 11:40 PM      Result Value Range   Sodium 139  135 - 145 mEq/L   Potassium 3.3 (*) 3.5 - 5.1 mEq/L   Chloride 105  96 - 112 mEq/L   BUN 15  6 - 23 mg/dL   Creatinine, Ser 1.30  0.50 - 1.35 mg/dL   Glucose, Bld 865 (*) 70 - 99 mg/dL   Calcium, Ion 7.84 (*) 1.13 - 1.30 mmol/L   TCO2 20  0 - 100 mmol/L   Hemoglobin 16.3  13.0 - 17.0 g/dL   HCT 69.6  29.5 - 28.4 %   Ct Head Wo Contrast  10/24/2012  *RADIOLOGY REPORT*  Clinical Data: Altered mental status.  CT HEAD WITHOUT CONTRAST  Technique:  Contiguous axial images were obtained from the base of the skull through the vertex without contrast.  Comparison: 02/09/2012.  Findings: There is a large cerebellar hematoma which is fairly midline but does extend asymmetric to the right.  It could be a hemorrhagic infarct, hypertensive hemorrhage or post-traumatic hematoma.  There is mild mass effect on the fourth ventricle.  No hydrocephalus.  There may be a small amount of blood on the right tentorium.  The bony structures are intact.  The paranasal sinuses and mastoid air cells are clear.  IMPRESSION: 5 x 3.5 cm cerebellar hematoma with mild mass effect on the fourth ventricle but no hydrocephalus.   Original  Report Authenticated By: Rudie Meyer, M.D.     Assessment: 78 y.o. male presenting with acute large cerebellar hemorrhage involving right hemisphere and midline with slight mass effect on the fourth ventricle. Fourth ventricle is patent and patient  has no signs of hydrocephalus at this point.  Stroke Risk Factors - diabetes mellitus, family history and hypertension  Plan: 1. admission to neuro intensive care unit  2. MRI, MRA  of the brain without contrast 3. PT consult, OT consult, Speech consult 4. Echocardiogram 5. Carotid dopplers 6. Prophylactic therapy - none 7. Alcohol withdrawal precautions 8. Hemoglobin A1c and fasting lipid panel  Patient's admission workup required complex evaluation and clinical decision-making as well as admission to intensive care unit for close monitoring. Total critical care time was one hour.  C.R. Roseanne Reno, MD [facility] [phone]  10/25/2012, 12:18 AM

## 2012-10-25 NOTE — Transfer of Care (Signed)
Immediate Anesthesia Transfer of Care Note  Patient: Bill Jackson  Procedure(s) Performed: Procedure(s) with comments: Suboccipital Craniectomy for Evacuation of Cerebellar Hematoma (N/A) - Suboccipital Craniectomy for Evacuation of Cerebellar Hematoma  Patient Location: PACU  Anesthesia Type:General  Level of Consciousness: awake, pateint uncooperative, confused and responds to stimulation  Airway & Oxygen Therapy: Patient Spontanous Breathing and Patient connected to face mask oxygen  Post-op Assessment: Report given to PACU RN, Patient moving all extremities and Patient moving all extremities X 4  Post vital signs: Reviewed and stable  Complications: No apparent anesthesia complications

## 2012-10-25 NOTE — Brief Op Note (Signed)
10/24/2012 - 10/25/2012  5:46 PM  PATIENT:  Bill Jackson  78 y.o. male  PRE-OPERATIVE DIAGNOSIS:  Cerebellar Hematoma  POST-OPERATIVE DIAGNOSIS:  Cerebellar Hematoma  PROCEDURE:  Procedure(s) with comments: Suboccipital Craniectomy for Evacuation of Cerebellar Hematoma (N/A) - Suboccipital Craniectomy for Evacuation of Cerebellar Hematoma with microdissection  SURGEON:  Surgeon(s) and Role:    * Florentino Laabs, MD - Primary  PHYSICIAN ASSISTANT:   ASSISTANTS: Poteat, RN   ANESTHESIA:   general  EBL:  Total I/O In: 2125 [I.V.:2125] Out: 540 [Urine:390; Emesis/NG output:50; Blood:100]  BLOOD ADMINISTERED:none  DRAINS: none   LOCAL MEDICATIONS USED:  MARCAINE     SPECIMEN:  Excision  DISPOSITION OF SPECIMEN:  PATHOLOGY  COUNTS:  YES  TOURNIQUET:  * No tourniquets in log *  DICTATION: Patient is 78 year old man with a large cerebellar hypertensive hemorrhage.  He has become progressively sleepy and CT shows enlargingintracerebral hematoma  With increasing mass effect.  It was elected to take patient to surgery for sub-occipital craniectomy for ICH.  Procedure:  Following smooth intubation, patient was placed in prone position on chest rolls with 3 pin head fixation. Occipital region was shaved and prepped and draped in usual sterile fashion with betadine scrub and Duraprep.  Area of planned incision was infiltrated with lidocaine. A linear incision was made in the suboccipital region to expose calvarium. High speed drill was used to perform bilateral suboccipital craniectomy to expose the dura.  Dura was opened over both cerebellar hemispheres and liga clips were placed to occlude a midline venous sinus.  A corticotomy was created in the right cerebellar hemisphere and carried to the large hematoma cavity.  This was evacuated and a considerable amount of blood was removed.  A similar clot removal was performed on the left side as well.   Hemostasis was assured with irrigation and  cotton balls.  Hemostasis was assured.  The brain was considerably more relaxed after hematoma evacuation.  The evacuation cavity was lined with Surgicell. The dura was patched with Dura-matrix and  the fascia was closed with 0 vicryl sutures, subcutaneous tissues were reapproximated with 2-0 vicryl sutures and the skin was re approximated with  A 3-0 Nylon sutures.  A sterile occlusive dressing was placed.  Patient was returned to a supine position and taken out of pins and extubated in the operating room and taken to Recovery having tolerated his surgery well.  Counts were correct at the end of the case.  PLAN OF CARE: Admit to inpatient   PATIENT DISPOSITION:  PACU - hemodynamically stable.   Delay start of Pharmacological VTE agent (>24hrs) due to surgical blood loss or risk of bleeding: yes  

## 2012-10-25 NOTE — Progress Notes (Signed)
UR completed 

## 2012-10-25 NOTE — OR Nursing (Signed)
Patient out of or confused, combative and trying to get out of bed. Dr. Noreene Larsson ask to come assess patient. Patient started on precedex drip.

## 2012-10-25 NOTE — ED Notes (Signed)
Patient transported to CT 

## 2012-10-25 NOTE — Progress Notes (Signed)
PT Cancellation Note  Patient Details Name: Bill Jackson MRN: [id number] DOB: [birth year]   Cancelled Treatment:    Reason Eval/Treat Not Completed: Patient not medically ready (pt on bedrest).  PT to check back in AM.  Also, will likely need OT order as well when ok to mobilize.     Rollene Rotunda Daniele Dillow, PT, DPT [phone]   10/25/2012, 2:16 PM

## 2012-10-25 NOTE — Op Note (Signed)
10/24/2012 - 10/25/2012  5:46 PM  PATIENT:  Bill Jackson  78 y.o. male  PRE-OPERATIVE DIAGNOSIS:  Cerebellar Hematoma  POST-OPERATIVE DIAGNOSIS:  Cerebellar Hematoma  PROCEDURE:  Procedure(s) with comments: Suboccipital Craniectomy for Evacuation of Cerebellar Hematoma (N/A) - Suboccipital Craniectomy for Evacuation of Cerebellar Hematoma with microdissection  SURGEON:  Surgeon(s) and Role:    * Maeola Harman, MD - Primary  PHYSICIAN ASSISTANT:   ASSISTANTS: Poteat, RN   ANESTHESIA:   general  EBL:  Total I/O In: 2125 [I.V.:2125] Out: 540 [Urine:390; Emesis/NG output:50; Blood:100]  BLOOD ADMINISTERED:none  DRAINS: none   LOCAL MEDICATIONS USED:  MARCAINE     SPECIMEN:  Excision  DISPOSITION OF SPECIMEN:  PATHOLOGY  COUNTS:  YES  TOURNIQUET:  * No tourniquets in log *  DICTATION: Patient is 78 year old man with a large cerebellar hypertensive hemorrhage.  He has become progressively sleepy and CT shows enlargingintracerebral hematoma  With increasing mass effect.  It was elected to take patient to surgery for sub-occipital craniectomy for ICH.  Procedure:  Following smooth intubation, patient was placed in prone position on chest rolls with 3 pin head fixation. Occipital region was shaved and prepped and draped in usual sterile fashion with betadine scrub and Duraprep.  Area of planned incision was infiltrated with lidocaine. A linear incision was made in the suboccipital region to expose calvarium. High speed drill was used to perform bilateral suboccipital craniectomy to expose the dura.  Dura was opened over both cerebellar hemispheres and liga clips were placed to occlude a midline venous sinus.  A corticotomy was created in the right cerebellar hemisphere and carried to the large hematoma cavity.  This was evacuated and a considerable amount of blood was removed.  A similar clot removal was performed on the left side as well.   Hemostasis was assured with irrigation and  cotton balls.  Hemostasis was assured.  The brain was considerably more relaxed after hematoma evacuation.  The evacuation cavity was lined with Surgicell. The dura was patched with Dura-matrix and  the fascia was closed with 0 vicryl sutures, subcutaneous tissues were reapproximated with 2-0 vicryl sutures and the skin was re approximated with  A 3-0 Nylon sutures.  A sterile occlusive dressing was placed.  Patient was returned to a supine position and taken out of pins and extubated in the operating room and taken to Recovery having tolerated his surgery well.  Counts were correct at the end of the case.  PLAN OF CARE: Admit to inpatient   PATIENT DISPOSITION:  PACU - hemodynamically stable.   Delay start of Pharmacological VTE agent (>24hrs) due to surgical blood loss or risk of bleeding: yes

## 2012-10-25 NOTE — ED Notes (Addendum)
MD at bedside. 

## 2012-10-25 NOTE — ED Notes (Signed)
Friend at bedside.

## 2012-10-25 NOTE — Progress Notes (Signed)
Pt vomited small amount bile-colored liquid; prn ondansetron 4mg  IVP given.

## 2012-10-26 ENCOUNTER — Inpatient Hospital Stay (HOSPITAL_COMMUNITY): Payer: Medicare Other

## 2012-10-26 ENCOUNTER — Encounter (HOSPITAL_COMMUNITY): Payer: Self-pay | Admitting: Radiology

## 2012-10-26 DIAGNOSIS — G936 Cerebral edema: Secondary | ICD-10-CM

## 2012-10-26 LAB — GLUCOSE, CAPILLARY
Glucose-Capillary: 98 mg/dL (ref 70–99)
Glucose-Capillary: 136 mg/dL — ABNORMAL HIGH (ref 70–99)
Glucose-Capillary: 123 mg/dL — ABNORMAL HIGH (ref 70–99)

## 2012-10-26 MED ORDER — MORPHINE SULFATE 2 MG/ML IJ SOLN
2.0000 mg | Freq: Once | INTRAMUSCULAR | Status: AC
  Administered 2012-10-26: 2 mg via INTRAVENOUS
  Filled 2012-10-26: qty 1

## 2012-10-26 MED ORDER — CHLORHEXIDINE GLUCONATE 0.12 % MT SOLN
15.0000 mL | Freq: Two times a day (BID) | OROMUCOSAL | Status: DC
  Administered 2012-10-26 – 2012-10-28 (×5): 15 mL via OROMUCOSAL
  Filled 2012-10-26 (×5): qty 15

## 2012-10-26 MED ORDER — BIOTENE DRY MOUTH MT LIQD
15.0000 mL | Freq: Two times a day (BID) | OROMUCOSAL | Status: DC
Start: 2012-10-26 — End: 2012-10-28
  Administered 2012-10-26 – 2012-10-28 (×5): 15 mL via OROMUCOSAL

## 2012-10-26 MED ORDER — MORPHINE SULFATE 2 MG/ML IJ SOLN
2.0000 mg | INTRAMUSCULAR | Status: DC | PRN
  Administered 2012-10-26 – 2012-10-27 (×6): 2 mg via INTRAVENOUS
  Filled 2012-10-26 (×6): qty 1

## 2012-10-26 NOTE — Evaluation (Signed)
Occupational Therapy Evaluation Patient Details Name: Bill Jackson MRN: [id number] DOB: Nov 09, 1934 Today's Date: 10/26/2012 Time: 4098-1191 OT Time Calculation (min): 16 min  OT Assessment / Plan / Recommendation Clinical Impression  78 yo male with hx of ETOH abuse, presenting 10/24/12 with acute onset of N/V with ataxia and dizziness. CT 5 x 3.5 cm acute cerebellar hemorrhage with mass effect on fourth ventricle. Dr [name] performed suboccipital craniectomy for evacuation of cerebellar hematoma with microdissection. Ot to follow acutely. Recommend CIR for d/c planning    OT Assessment  Patient needs continued OT Services    Follow Up Recommendations  CIR    Barriers to Discharge      Equipment Recommendations  3 in 1 bedside comode;Wheelchair (measurements OT);Wheelchair cushion (measurements OT)    Recommendations for Other Services Rehab consult  Frequency  Min 2X/week    Precautions / Restrictions Precautions Precautions: Fall Restrictions Weight Bearing Restrictions: No   Pertinent Vitals/Pain Monitored and stable C/o dizziness initially with sitting Eye closed 80% of session    ADL  Grooming: Wash/dry face;Minimal assistance (coughing provided wash cloth to cover mouth) Where Assessed - Grooming: Supported sitting Toilet Transfer: +2 Total assistance Toilet Transfer: Patient Percentage: 30% Statistician Method: Sit to Barista: Raised toilet seat with arms (or 3-in-1 over toilet) Transfers/Ambulation Related to ADLs: pt able to sit<>Stand only due to impulsive and cognitive deficits ADL Comments: Pt supine asleep on arrival. pt startles easily with name call or tactile input. Pt with dilated pupils and static stare. pt without tracking or scanning during sitting. Pt keeping eyes closed 80% of session. Pt unaware of location reason for admission but able to recall DOB. Pt restless and easily agitiated with prolonged sitting.     OT Diagnosis:  Generalized weakness;Cognitive deficits;Disturbance of vision  OT Problem List: Decreased strength;Decreased range of motion;Decreased activity tolerance;Impaired balance (sitting and/or standing);Impaired vision/perception;Decreased coordination;Decreased cognition;Decreased safety awareness;Decreased knowledge of use of DME or AE;Decreased knowledge of precautions;Obesity OT Treatment Interventions: Self-care/ADL training;DME and/or AE instruction;Therapeutic activities;Neuromuscular education;Cognitive remediation/compensation;Visual/perceptual remediation/compensation;Patient/family education;Balance training   OT Goals Acute Rehab OT Goals OT Goal Formulation: Patient unable to participate in goal setting Time For Goal Achievement: 11/09/12 Potential to Achieve Goals: Good ADL Goals Pt Will Perform Grooming: with set-up;Sitting, chair;Supported ADL Goal: Grooming - Progress: Goal set today Pt Will Perform Upper Body Bathing: with mod assist;Sitting, chair;Supported ADL Goal: Upper Body Bathing - Progress: Goal set today Pt Will Perform Upper Body Dressing: with mod assist;Sitting, chair;Supported ADL Goal: Patent attorney - Progress: Goal set today Pt Will Transfer to Toilet: with max assist;Ambulation;3-in-1 ADL Goal: Toilet Transfer - Progress: Goal set today Miscellaneous OT Goals Miscellaneous OT Goal #1: Pt will demonstrate sustained attention for ~30 seconds to adl task OT Goal: Miscellaneous Goal #1 - Progress: Goal set today Miscellaneous OT Goal #2: Pt will sit EOB for ~5 minutes with adl task with min (A)  OT Goal: Miscellaneous Goal #2 - Progress: Goal set today  Visit Information  Last OT Received On: 10/26/12 Assistance Needed: +2 PT/OT Co-Evaluation/Treatment: Yes    Subjective Data  Subjective: "Come on Come on"- pt verbalizing the need to go but unable to verbalize where or why Patient Stated Goal: unable to participate at this time   Prior  Functioning     Home Living Lives With: Other (Comment) (unknown patient unable to participate) Additional Comments: pt with male visitor that states when questioned that he is not family Communication Communication: Expressive difficulties (slurred  speech) Dominant Hand:  (unknown at this time)         Vision/Perception Vision - History Patient Visual Report: Other (comment);Undershooting (to be further assessed) Vision - Assessment Additional Comments: cognitive deficits limiting participation in visual testing. Pt with dilated pupils   Cognition  Cognition Overall Cognitive Status: Impaired Area of Impairment: Attention;Memory;Safety/judgement;Awareness of deficits Arousal/Alertness: Lethargic Orientation Level: Disoriented to;Place;Time;Situation Behavior During Session: Restless Current Attention Level: Focused Memory: Decreased recall of precautions Memory Deficits: no recall of location, reason for admission or surgery Safety/Judgement: Decreased awareness of safety precautions;Decreased safety judgement for tasks assessed;Impulsive;Decreased awareness of need for assistance    Extremity/Trunk Assessment Right Upper Extremity Assessment RUE ROM/Strength/Tone: Deficits;Due to impaired cognition;Unable to fully assess RUE ROM/Strength/Tone Deficits: pt AROM and gross grasp. Pt able to make fist RUE Coordination: WFL - gross motor Left Upper Extremity Assessment LUE ROM/Strength/Tone: Deficits;Due to impaired cognition;Unable to fully assess LUE ROM/Strength/Tone Deficits: able to make fist , AROM noted LUE Coordination: WFL - gross motor Trunk Assessment Trunk Assessment: Normal     Mobility Bed Mobility Bed Mobility: Rolling Right;Right Sidelying to Sit;Sitting - Scoot to [place] of Bed;Sit to Supine Rolling Right: 2: Max assist Right Sidelying to Sit: 1: +2 Total assist;HOB elevated Right Sidelying to Sit: Patient Percentage: 40% Sitting - Scoot to Edge of Bed:  3: Mod assist (with pad) Sit to Supine: 1: +2 Total assist;HOB flat Sit to Supine: Patient Percentage: 20% Details for Bed Mobility Assistance: Pt with eyes closed and needed v/c to open eyes to give visual attention to task. Pt required tactile cues and startles easily. Pt posterior lean with sitting  Transfers Transfers: Sit to Stand;Stand to Sit Sit to Stand: 1: +2 Total assist;With upper extremity assist;From bed Sit to Stand: Patient Percentage: 30% Stand to Sit: 1: +2 Total assist;With upper extremity assist;To bed Stand to Sit: Patient Percentage: 20% Details for Transfer Assistance: required tactile cues for hip flexion to return to sitting. Pt static standing with posterior lean, Lt le blocked. Pt opening eyes very wide but not tracking or attending to any particular object. Pt static standing for ~45 seconds due to restless     Exercise     Balance Balance Balance Assessed: Yes Static Sitting Balance Static Sitting - Balance Support: Bilateral upper extremity supported;Feet supported Static Sitting - Level of Assistance: 3: Mod assist;2: Max assist Static Sitting - Comment/# of Minutes: ~10 minutes- Pt with posterior lean during sitting EOB. pt initiated sitting to min (A) for ~20 seconds and needed incr assistance due to restless behavior. Pt pulling with Lt UE on bedrail yelling come on come on several times during session. Pt with garbled speech unable to understand.   End of Session OT - End of Session Activity Tolerance: Patient tolerated treatment well Patient left: in bed;with call bell/phone within reach;with family/visitor present Nurse Communication: Mobility status;Precautions  GO     Lucile Shutters 10/26/2012, 2:00 PM Pager: [phone]

## 2012-10-26 NOTE — Evaluation (Signed)
Physical Therapy Evaluation Patient Details Name: Bill Jackson MRN: [id number] DOB: October 07, 1934 Today's Date: 10/26/2012 Time: 6578-4696 PT Time Calculation (min): 16 min  PT Assessment / Plan / Recommendation Clinical Impression  pt presents with Cerebellar Hematoma s/p Suboccipital Crani.  pt generally restless and can get aggitated easily.  pt with garbled speech and cognitive deficits affecting participation with mobility.  pt would benefit from CIR at D/C to continue therapies.      PT Assessment  Patient needs continued PT services    Follow Up Recommendations  CIR    Does the patient have the potential to tolerate intense rehabilitation      Barriers to Discharge None      Equipment Recommendations  Rolling walker with 5" wheels    Recommendations for Other Services Rehab consult   Frequency Min 3X/week    Precautions / Restrictions Precautions Precautions: Fall Restrictions Weight Bearing Restrictions: No   Pertinent Vitals/Pain Denies pain, but seems to have startle response at times.  Unsure if this is related to pain or mobility.        Mobility  Bed Mobility Bed Mobility: Rolling Right;Right Sidelying to Sit;Sitting - Scoot to [place] of Bed;Sit to Supine Rolling Right: 2: Max assist Right Sidelying to Sit: 1: +2 Total assist;HOB elevated Right Sidelying to Sit: Patient Percentage: 40% Sitting - Scoot to Edge of Bed: 3: Mod assist (with pad) Sit to Supine: 1: +2 Total assist;HOB flat Sit to Supine: Patient Percentage: 20% Details for Bed Mobility Assistance: Pt with eyes closed and needed v/c to open eyes to give visual attention to task. Pt required tactile cues and startles easily. Pt posterior lean with sitting  Transfers Transfers: Sit to Stand;Stand to Sit Sit to Stand: 1: +2 Total assist;With upper extremity assist;From bed Sit to Stand: Patient Percentage: 30% Stand to Sit: 1: +2 Total assist;With upper extremity assist;To bed Stand to Sit: Patient  Percentage: 20% Details for Transfer Assistance: required tactile cues for hip flexion to return to sitting. Pt static standing with posterior lean, Lt le blocked. Pt opening eyes very wide but not tracking or attending to any particular object. Pt static standing for ~45 seconds due to restless Ambulation/Gait Ambulation/Gait Assistance: Not tested (comment) Ambulation/Gait Assistance Details: Attempted to have pt takes steps towards [facility], however pt simply leans posteriorly and R towards HOB.  pt returned to sitting.   Stairs: No Wheelchair Mobility Wheelchair Mobility: No    Exercises     PT Diagnosis: Difficulty walking  PT Problem List: Decreased strength;Decreased activity tolerance;Decreased balance;Decreased mobility;Decreased coordination;Decreased cognition;Decreased knowledge of use of DME;Decreased safety awareness;Obesity PT Treatment Interventions: DME instruction;Gait training;Functional mobility training;Therapeutic activities;Therapeutic exercise;Balance training;Neuromuscular re-education;Cognitive remediation;Patient/family education   PT Goals Acute Rehab PT Goals PT Goal Formulation: With patient Time For Goal Achievement: 11/09/12 Potential to Achieve Goals: Good Pt will go Supine/Side to Sit: with supervision PT Goal: Supine/Side to Sit - Progress: Goal set today Pt will go Sit to Supine/Side: with supervision PT Goal: Sit to Supine/Side - Progress: Goal set today Pt will go Sit to Stand: with supervision PT Goal: Sit to Stand - Progress: Goal set today Pt will go Stand to Sit: with supervision PT Goal: Stand to Sit - Progress: Goal set today Pt will Transfer Bed to Chair/Chair to Bed: with min assist PT Transfer Goal: Bed to Chair/Chair to Bed - Progress: Goal set today Pt will Ambulate: 51 - 150 feet;with min assist;with rolling walker PT Goal: Ambulate - Progress: Goal set today  Visit Information  Last PT Received On: 10/26/12 Assistance Needed: +2 PT/OT  Co-Evaluation/Treatment: Yes    Subjective Data  Subjective: I wanna go! Patient Stated Goal: None stated.     Prior Functioning  Home Living Lives With: Other (Comment) (unknown patient unable to participate) Additional Comments: pt with male visitor that states when questioned that he is not family Communication Communication: Expressive difficulties (slurred speech) Dominant Hand:  (unknown at this time)    Cognition  Cognition Overall Cognitive Status: Impaired Area of Impairment: Attention;Memory;Safety/judgement;Awareness of deficits Arousal/Alertness:  (drowsy, but can be aroused.  ) Orientation Level: Disoriented to;Place;Time;Situation Behavior During Session: Restless Current Attention Level: Focused Memory: Decreased recall of precautions Memory Deficits: no recall of location, reason for admission or surgery Safety/Judgement: Decreased awareness of safety precautions;Decreased safety judgement for tasks assessed;Impulsive;Decreased awareness of need for assistance    Extremity/Trunk Assessment Right Upper Extremity Assessment RUE ROM/Strength/Tone: Deficits;Due to impaired cognition;Unable to fully assess RUE ROM/Strength/Tone Deficits: pt AROM and gross grasp. Pt able to make fist RUE Coordination: WFL - gross motor Left Upper Extremity Assessment LUE ROM/Strength/Tone: Deficits;Due to impaired cognition;Unable to fully assess LUE ROM/Strength/Tone Deficits: able to make fist , AROM noted LUE Coordination: WFL - gross motor Right Lower Extremity Assessment RLE ROM/Strength/Tone: Unable to fully assess;Due to impaired cognition Left Lower Extremity Assessment LLE ROM/Strength/Tone: Unable to fully assess;Due to impaired cognition Trunk Assessment Trunk Assessment: Normal   Balance Balance Balance Assessed: Yes Static Sitting Balance Static Sitting - Balance Support: Bilateral upper extremity supported;Feet supported Static Sitting - Level of Assistance: 3: Mod  assist;2: Max assist Static Sitting - Comment/# of Minutes: Sat EOB ~10 mins with posterior lean.  Initially pt MinA to maintain sitting balance, but 2/2 restlessness pt required increased A to maintain balance.  While sitting EOB pt holding bedrail and yelling "Come on, come on!"    End of Session PT - End of Session Activity Tolerance: Patient limited by fatigue (Limited by cognitive deficits.  ) Patient left: in bed;with call bell/phone within reach;with nursing in room;with bed alarm set Nurse Communication: Mobility status  GP     Sunny Schlein, [place] [phone] 10/26/2012, 2:06 PM

## 2012-10-26 NOTE — Evaluation (Signed)
SLP reviewed and agree with student findings.   Tomoko Sandra MA, CCC-SLP [phone]    

## 2012-10-26 NOTE — Progress Notes (Signed)
Rehab Admissions Coordinator Note:  Patient was screened by Meryl Dare for appropriateness for an Inpatient Acute Rehab Consult.  At this time, we are recommending Inpatient Rehab consult.  Meryl Dare 10/26/2012, 3:19 PM  I can be reached at [phone].

## 2012-10-26 NOTE — Progress Notes (Signed)
Stroke Team Progress Note  HISTORY Bill Jackson is an 78 y.o. male history of hypertension and diabetes mellitus as well as alcohol abuse, presenting 10/24/2012 with acute onset of nausea and vomiting and complained of dizziness as well as ataxia. He was last known well at 2145 on 10/24/2012. There is no previous history of stroke nor TIA. He has not been on antiplatelet therapy no anticoagulation. CT scan of the head showed a 5 x 3.5 cm acute cerebellar hemorrhage with mild mass effect on fourth ventricle. Fourth ventricle is patent and patient had no signs of hydrocephalus. Patient was not a TPA candidate secondary to hemorrhage. He was admitted to the neuro ICU for further evaluation and treatment.  SUBJECTIVE Coughing, wretching up stuff. Just received morphine. Confusion post op.  OBJECTIVE Most recent Vital Signs: Filed Vitals:   10/26/12 0600 10/26/12 0700 10/26/12 0800 10/26/12 0845  BP: 127/71 140/71 156/70   Pulse: 57 55 56 50  Temp: 98.8 F (37.1 C) 99.1 F (37.3 C) 99.7 F (37.6 C) 99.9 F (37.7 C)  TempSrc:   Other (Comment) Other (Comment)  Resp: 16 16 19 20   Height:      Weight:      SpO2: 96% 97% 99% 98%   CBG (last 3)   Recent Labs  10/26/12 0009 10/26/12 0422 10/26/12 0733  GLUCAP 151* 127* 98    IV Fluid Intake:   . sodium chloride 75 mL/hr at 10/26/12 0800  . dexmedetomidine 0.478 mcg/kg/hr (10/25/12 1830)  . dexmedetomidine Stopped (10/26/12 0800)    MEDICATIONS  . antiseptic oral rinse  15 mL Mouth Rinse q12n4p  . benazepril  40 mg Oral Daily  . chlorhexidine  15 mL Mouth Rinse BID  . folic acid  1 mg Oral Daily  . glipiZIDE  10 mg Oral QAC breakfast  . insulin aspart  0-15 Units Subcutaneous Q4H  . metFORMIN  1,000 mg Oral Q breakfast  . multivitamin with minerals  1 tablet Oral Daily  . pantoprazole (PROTONIX) IV  40 mg Intravenous QHS  . senna-docusate  1 tablet Oral BID  . thiamine  100 mg Intravenous Daily   PRN:  acetaminophen,  acetaminophen, labetalol, LORazepam, LORazepam, ondansetron (ZOFRAN) IV  Diet:  NPO  Activity:  Bedrest DVT Prophylaxis:  SCDs   CLINICALLY SIGNIFICANT STUDIES Basic Metabolic Panel:   Recent Labs Lab 10/24/12 2340  NA 139  K 3.3*  CL 105  GLUCOSE 215*  BUN 15  CREATININE 1.10   Liver Function Tests: No results found for this basename: AST, ALT, ALKPHOS, BILITOT, PROT, ALBUMIN,  in the last 168 hours CBC:   Recent Labs Lab 10/24/12 2327 10/24/12 2340  WBC 7.4  --   HGB 15.8 16.3  HCT 43.6 48.0  MCV 92.4  --   PLT 246  --    Coagulation:   Recent Labs Lab 10/24/12 2327  LABPROT 12.9  INR 0.98   Cardiac Enzymes:   Recent Labs Lab 10/25/12 0345 10/25/12 0700  TROPONINI <0.30 <0.30   Urinalysis:   Recent Labs Lab 10/25/12 0047  COLORURINE YELLOW  LABSPEC 1.019  PHURINE 5.0  GLUCOSEU >1000*  HGBUR TRACE*  BILIRUBINUR NEGATIVE  KETONESUR 15*  PROTEINUR NEGATIVE  UROBILINOGEN 0.2  NITRITE NEGATIVE  LEUKOCYTESUR NEGATIVE   Lipid Panel No results found for this basename: chol,  trig,  hdl,  cholhdl,  vldl,  ldlcalc   HgbA1C  Lab Results  Component Value Date   HGBA1C 6.7* 10/25/2012  Urine Drug Screen:   No results found for this basename: labopia,  cocainscrnur,  labbenz,  amphetmu,  thcu,  labbarb    Alcohol Level:   Recent Labs Lab 10/24/12 2327  ETH 23*    CT of the brain  10/26/2012 Postoperative changes with evacuation of most of the cerebellar hematoma. Small right cerebellar hematoma measuring 20 mm x 26 mm persists. Diffuse pneumocephalus. More prominent blood in the ventricles with stable ventricular size. 10/25/2012 Cerebellar hemorrhage in the vermis extending to the right is similar in size. There is new intraventricular hemorrhage and minimal progressive dilatation of ventricles.  10/24/2012  5 x 3.5 cm cerebellar hematoma with mild mass effect on the fourth ventricle but no hydrocephalus.    MRI of the brain    MRA of the  brain    CXR  10/25/2012  Low volume chest with probable pulmonary vascular congestion.   EKG  normal sinus rhythm  Therapy Recommendations   Physical Exam   Elderly African American male not in distress. . Afebrile. Head is nontraumatic. Neck is supple without bruit. Hearing is normal. Cardiac exam no murmur or gallop. Lungs are clear to auscultation. Distal pulses are well felt. Neurological Exam ; drowsy but opens eyes easily and follow simple commands. Pupils 3 mm equal reactive. Extraocular moments are full range but saccadic dysmetria   on either direction when looking laterally. Fundi could not be visualized. He has significant dysarthria with started to speech. No facial weakness. Tongue is midline. Cough and gag appear weak. He can move all 4 extremities well against gravity but has significant right finger-to-nose dysmetria with almost a violent tremor when he approaches his nose. Mild proximal lower extremity weakness bilaterally. Deep tendon flexes are depressed. Plantars are downgoing. Sensation appears intact. Gait was not tested.  ASSESSMENT Mr. Bill Jackson is a 78 y.o. male presenting with nausea, vomiting, dizziness and ataxia. Imaging confirms a midline large cerebellar hemorrhage with mass effect on the fourth ventricle. Hemorrhage felt to be secondary to accelerated Hypertension; highest BP 201/94 On no antiplatelets prior to admission. Patient with resultant right hand/arm severe dysmetria, scanning dysarthria, upgaze paralysis, lethargic. Given acute hemorrhage, patient at risk for hemorrhage, extension, cerebral edema, herniation, obstructive hydrocephalus, therefore Dr. [name] performed a Suboccipital Craniectomy for Evacuation of Cerebellar Hematoma with microdissection 10/25/2012, POD #1. CT this am shows expected improvement, no change in neuro exam other than increased lethargy/confusion.   Headache secondary to stroke/hmg Nausea, wretching secondary to  stroke/hmg Hypertension Diabetes, HgbA1c 6.7 CAD - OHS, cardiac stents Cigarette smoker etoh use - concerned with possible withdrawal  Hospital day # 2  TREATMENT/PLAN  Continue ICU level care  Pain control  Keep in bed today  Check swallow  D/c aline  Labs - BMET and CBC in am  Annie Main, MSN, RN, ANVP-BC, ANP-BC, [facility] Pager: [phone] 10/26/2012 8:49 AM  I have personally obtained a history, examined the patient, evaluated imaging results, and formulated the assessment and plan of care. I agree with the above. This patient is critically ill and at significant risk of neurological worsening, death and care requires constant monitoring of vital signs, hemodynamics,respiratory and cardiac monitoring,review of multiple databases, neurological assessment, discussion with family, other specialists and medical decision making of high complexity. I spent 30 minutes of neurocritical care time  in the care of  this patient. Delia Heady, MD

## 2012-10-26 NOTE — Progress Notes (Signed)
Subjective: Patient reports sedated with precedex, will answer questions and follow commands.  Objective: Vital signs in last 24 hours: Temp:  [97.5 F (36.4 C)-99.1 F (37.3 C)] 98.8 F (37.1 C) (03/26 0600) Pulse Rate:  [50-79] 57 (03/26 0600) Resp:  [16-23] 16 (03/26 0600) BP: (94-136)/(57-76) 127/71 mmHg (03/26 0600) SpO2:  [94 %-99 %] 96 % (03/26 0600) Arterial Line BP: (115-169)/(51-81) 146/64 mmHg (03/26 0600)  Intake/Output from previous day: 03/25 0701 - 03/26 0700 In: 3425.2 [I.V.:3425.2] Out: 935 [Urine:785; Emesis/NG output:50; Blood:100] Intake/Output this shift: Total I/O In: 1300.2 [I.V.:1300.2] Out: 395 [Urine:395]  Physical Exam: Awakens to voice.  States name, dysarthric.  MAE to command.  Inconsistent answers to finger count and eye movements.  Wound CDI.  Lab Results:  Recent Labs  10/24/12 2327 10/24/12 2340  WBC 7.4  --   HGB 15.8 16.3  HCT 43.6 48.0  PLT 246  --    BMET  Recent Labs  10/24/12 2340  NA 139  K 3.3*  CL 105  GLUCOSE 215*  BUN 15  CREATININE 1.10    Studies/Results: Ct Head Wo Contrast  10/26/2012  *RADIOLOGY REPORT*  Clinical Data: Follow-up.  The patient agitated.  Recent surgical decompression.  CT HEAD WITHOUT CONTRAST  Technique:  Contiguous axial images were obtained from the base of the skull through the vertex without contrast.  Comparison: 10/25/2012.  Findings: Pneumocephalus is present diffusely.  Intraventricular blood is present bilaterally in the lateral ventricle occipital horns.  Occipital craniectomy has been performed.  Surgical clips are present in the craniectomy bed along with gas in the operative bed.  Evacuation of the majority of the cerebellar hematoma in the vermis.  Small amount of hemorrhage remains present in the right cerebellar hemisphere measuring 26 mm x 20 mm.  Mastoid air cells appear clear.  There is a small amount of intraventricular hemorrhage casting in the frontal horns of the lateral  ventricles which is new compared to prior.  There is no remote hemorrhage.  IMPRESSION: Postoperative changes with evacuation of most of the cerebellar hematoma.  Small right cerebellar hematoma measuring 20 mm x 26 mm persists.  Diffuse pneumocephalus.  More prominent blood in the ventricles with stable ventricular size.   Original Report Authenticated By: Andreas Newport, M.D.    Ct Head Wo Contrast  10/25/2012  *RADIOLOGY REPORT*  Clinical Data: Follow up hemorrhage  CT HEAD WITHOUT CONTRAST  Technique:  Contiguous axial images were obtained from the base of the skull through the vertex without contrast.  Comparison: CT 10/24/2012  Findings: Cerebellar hematoma in the cerebellar vermis extending more to the right than the left is unchanged.  The hematoma measures 5.6 x 3.9 cm with surrounding edema.  There is mass effect on the fourth ventricle.  There is a mild amount of intraventricular hemorrhage in the occipital horns bilaterally which is not identified on the prior study.  Ventricles appear slightly more dilated but there is no hydrocephalus.  Continued follow-up of ventricular size is warranted.  There is mild atrophy and mild chronic microvascular ischemia.  IMPRESSION: Cerebellar hemorrhage in the vermis extending to the right is similar in size.  There is new  intraventricular hemorrhage and minimal progressive dilatation of ventricles.   Original Report Authenticated By: Janeece Riggers, M.D.    Ct Head Wo Contrast  10/24/2012  *RADIOLOGY REPORT*  Clinical Data: Altered mental status.  CT HEAD WITHOUT CONTRAST  Technique:  Contiguous axial images were obtained from the base of the skull  through the vertex without contrast.  Comparison: 02/09/2012.  Findings: There is a large cerebellar hematoma which is fairly midline but does extend asymmetric to the right.  It could be a hemorrhagic infarct, hypertensive hemorrhage or post-traumatic hematoma.  There is mild mass effect on the fourth ventricle.  No  hydrocephalus.  There may be a small amount of blood on the right tentorium.  The bony structures are intact.  The paranasal sinuses and mastoid air cells are clear.  IMPRESSION: 5 x 3.5 cm cerebellar hematoma with mild mass effect on the fourth ventricle but no hydrocephalus.   Original Report Authenticated By: Rudie Meyer, M.D.    Dg Chest Port 1 View  10/25/2012  *RADIOLOGY REPORT*  Clinical Data: Short of breath.  Altered mental status.  PORTABLE CHEST - 1 VIEW  Comparison: 12/05/2011.  Findings: Low volume chest.  Basilar atelectasis. Probable pulmonary vascular congestion.  The low volumes accentuate the cardiopericardial silhouette. Monitoring leads are projected over the chest.  Low volumes also produce widening of the upper mediastinum.  Trachea appears within normal limits.  IMPRESSION: Low volume chest with probable pulmonary vascular congestion.   Original Report Authenticated By: Andreas Newport, M.D.     Assessment/Plan: Head CT much improved.  Less agitated this am, may be able to wean precedex.  Continue to monitor and control BP.  Mobilize.  Medical management per Stroke Service.    LOS: 2 days    Dorian Heckle, MD 10/26/2012, 6:19 AM

## 2012-10-26 NOTE — Consult Note (Signed)
Physical Medicine and Rehabilitation Consult Reason for Consult:Impaired balance, ataxia, dysarthria, dysphagia Referring Physician: Dr. Pearlean Brownie   HPI: Bill Jackson is a 78 y.o. male with history of hypertension, diabetes mellitus,  who was admitted on 10/25/12 with acute onset of nausea and vomiting, dizziness as well as ataxia. CT scan of the head showed a 5 x 3.5 cm acute cerebellar hemorrhage with mild mass effect on fourth ventricle. Fourth ventricle is patent and patient had no signs of hydrocephalus. He was evaluated by Dr. [name] and underwent suboccipital craniectomy for evacuation of hematoma that pm. Neurology felt that patient with hemorrhage due to accelerated HTN. Patient with resultant dysarthria, with weak cough, significant dysmetria RUE. NPO recommended by ST. PT/OT evaluations reveal  Distractibility with poor awareness, no visual tracking and impaired balance with sitting and standing attempts. Therapy team/MD recommending CIR.     Review of Systems  Unable to perform ROS: mental acuity    Past Medical History  Diagnosis Date  . Hypertension   . Diabetes mellitus    Past Surgical History  Procedure Laterality Date  . Heart stents  x3  . Cardiac surgery    . Hernia repair    . Rotator cuff repair     History reviewed. No pertinent family history.  Social History: Per power of attorney (Friend-Timothy) Single. Has sons who are not involved with him but POA talks with him daily/visits weekly. Retired Midwife.  Goes to [facility] for medical care. He reports that he has been smoking.  He does not have any smokeless tobacco history on file. Per reports that patient drinks  2 glasses of wine daily/ occasional liquor. Per  reports that he does not use illicit drugs.   Allergies  Allergen Reactions  . Penicillins Other (See Comments)    unknown   Medications Prior to Admission  Medication Sig Dispense Refill  . benazepril (LOTENSIN) 40 MG tablet Take 40 mg by mouth  daily.      Marland Kitchen glipiZIDE (GLUCOTROL) 10 MG tablet Take 10 mg by mouth daily.      . insulin NPH-insulin regular (NOVOLIN 70/30) (70-30) 100 UNIT/ML injection Inject 15 Units into the skin as needed. Per sliding scale      . metFORMIN (GLUCOPHAGE) 1000 MG tablet Take 1,000 mg by mouth daily.         Home: Home Living Lives With: Other (Comment) (unknown patient unable to participate) Additional Comments: pt with male visitor that states when questioned that he is not family  Functional History:   Functional Status:  Mobility: Bed Mobility Bed Mobility: Rolling Right;Right Sidelying to Sit;Sitting - Scoot to [place] of Bed;Sit to Supine Rolling Right: 2: Max assist Right Sidelying to Sit: 1: +2 Total assist;HOB elevated Right Sidelying to Sit: Patient Percentage: 40% Sitting - Scoot to Edge of Bed: 3: Mod assist (with pad) Sit to Supine: 1: +2 Total assist;HOB flat Sit to Supine: Patient Percentage: 20% Transfers Transfers: Sit to Stand;Stand to Sit Sit to Stand: 1: +2 Total assist;With upper extremity assist;From bed Sit to Stand: Patient Percentage: 30% Stand to Sit: 1: +2 Total assist;With upper extremity assist;To bed Stand to Sit: Patient Percentage: 20% Ambulation/Gait Ambulation/Gait Assistance: Not tested (comment) Ambulation/Gait Assistance Details: Attempted to have pt takes steps towards [facility], however pt simply leans posteriorly and R towards HOB.  pt returned to sitting.   Stairs: No Wheelchair Mobility Wheelchair Mobility: No  ADL: ADL Grooming: Wash/dry face;Minimal assistance (coughing provided wash cloth to cover mouth) Where  Assessed - Grooming: Supported sitting Toilet Transfer: +2 Total assistance Toilet Transfer Method: Sit to Barista: Raised toilet seat with arms (or 3-in-1 over toilet) Transfers/Ambulation Related to ADLs: pt able to sit<>Stand only due to impulsive and cognitive deficits ADL Comments: Pt supine asleep on arrival. pt  startles easily with name call or tactile input. Pt with dilated pupils and static stare. pt without tracking or scanning during sitting. Pt keeping eyes closed 80% of session. Pt unaware of location reason for admission but able to recall DOB. Pt restless and easily agitiated with prolonged sitting.   Cognition: Cognition Arousal/Alertness:  (drowsy, but can be aroused.  ) Orientation Level: Oriented to person;Oriented to place Cognition Overall Cognitive Status: Impaired Area of Impairment: Attention;Memory;Safety/judgement;Awareness of deficits Arousal/Alertness:  (drowsy, but can be aroused.  ) Orientation Level: Disoriented to;Place;Time;Situation Behavior During Session: Restless Current Attention Level: Focused Memory: Decreased recall of precautions Memory Deficits: no recall of location, reason for admission or surgery Safety/Judgement: Decreased awareness of safety precautions;Decreased safety judgement for tasks assessed;Impulsive;Decreased awareness of need for assistance  Blood pressure 117/68, pulse 62, temperature 100.4 F (38 C), temperature source Axillary, resp. rate 16, height 5\' 9"  (1.753 m), weight 92.1 kg (203 lb 0.7 oz), SpO2 93.00%. Physical Exam  Nursing note and vitals reviewed. Constitutional: He appears well-developed and well-nourished.  HENT:  Head: Normocephalic and atraumatic.  Cardiovascular: Normal rate and regular rhythm.   Pulmonary/Chest: Effort normal and breath sounds normal.  Abdominal: Soft. Bowel sounds are normal.  Neurological: He is alert.  Oriented to self and place. Garbled/confused speech with occasional appropriate utterances. Able to state age and friend's name. Alert. Dysconjugate gaze. Decreased vision in peripheral fields. Cataracts noted. Decreased FMC, does not have severe upper extremity ataxic. Legs more ataxic. Strength near 4 to 5/5. Does swallow applesauce and crackers without significant difficulty but does need extra time.      Results for orders placed during the hospital encounter of 10/24/12 (from the past 24 hour(s))  GLUCOSE, CAPILLARY     Status: Abnormal   Collection Time    10/25/12  6:30 PM      Result Value Range   Glucose-Capillary 174 (*) 70 - 99 mg/dL  GLUCOSE, CAPILLARY     Status: Abnormal   Collection Time    10/25/12  8:28 PM      Result Value Range   Glucose-Capillary 205 (*) 70 - 99 mg/dL  HEMOGLOBIN E4V     Status: Abnormal   Collection Time    10/25/12  9:00 PM      Result Value Range   Hemoglobin A1C 6.8 (*) <5.7 %   Mean Plasma Glucose 148 (*) <117 mg/dL  GLUCOSE, CAPILLARY     Status: Abnormal   Collection Time    10/26/12 12:09 AM      Result Value Range   Glucose-Capillary 151 (*) 70 - 99 mg/dL  GLUCOSE, CAPILLARY     Status: Abnormal   Collection Time    10/26/12  4:22 AM      Result Value Range   Glucose-Capillary 127 (*) 70 - 99 mg/dL   Comment 1 Notify RN    GLUCOSE, CAPILLARY     Status: None   Collection Time    10/26/12  7:33 AM      Result Value Range   Glucose-Capillary 98  70 - 99 mg/dL  GLUCOSE, CAPILLARY     Status: Abnormal   Collection Time    10/26/12 12:12 PM  Result Value Range   Glucose-Capillary 136 (*) 70 - 99 mg/dL   Ct Head Wo Contrast  10/26/2012  *RADIOLOGY REPORT*  Clinical Data: Follow-up.  The patient agitated.  Recent surgical decompression.  CT HEAD WITHOUT CONTRAST  Technique:  Contiguous axial images were obtained from the base of the skull through the vertex without contrast.  Comparison: 10/25/2012.  Findings: Pneumocephalus is present diffusely.  Intraventricular blood is present bilaterally in the lateral ventricle occipital horns.  Occipital craniectomy has been performed.  Surgical clips are present in the craniectomy bed along with gas in the operative bed.  Evacuation of the majority of the cerebellar hematoma in the vermis.  Small amount of hemorrhage remains present in the right cerebellar hemisphere measuring 26 mm x 20 mm.   Mastoid air cells appear clear.  There is a small amount of intraventricular hemorrhage casting in the frontal horns of the lateral ventricles which is new compared to prior.  There is no remote hemorrhage.  IMPRESSION: Postoperative changes with evacuation of most of the cerebellar hematoma.  Small right cerebellar hematoma measuring 20 mm x 26 mm persists.  Diffuse pneumocephalus.  More prominent blood in the ventricles with stable ventricular size.   Original Report Authenticated By: Andreas Newport, M.D.    Ct Head Wo Contrast  10/25/2012  *RADIOLOGY REPORT*  Clinical Data: Follow up hemorrhage  CT HEAD WITHOUT CONTRAST  Technique:  Contiguous axial images were obtained from the base of the skull through the vertex without contrast.  Comparison: CT 10/24/2012  Findings: Cerebellar hematoma in the cerebellar vermis extending more to the right than the left is unchanged.  The hematoma measures 5.6 x 3.9 cm with surrounding edema.  There is mass effect on the fourth ventricle.  There is a mild amount of intraventricular hemorrhage in the occipital horns bilaterally which is not identified on the prior study.  Ventricles appear slightly more dilated but there is no hydrocephalus.  Continued follow-up of ventricular size is warranted.  There is mild atrophy and mild chronic microvascular ischemia.  IMPRESSION: Cerebellar hemorrhage in the vermis extending to the right is similar in size.  There is new  intraventricular hemorrhage and minimal progressive dilatation of ventricles.   Original Report Authenticated By: Janeece Riggers, M.D.    Ct Head Wo Contrast  10/24/2012  *RADIOLOGY REPORT*  Clinical Data: Altered mental status.  CT HEAD WITHOUT CONTRAST  Technique:  Contiguous axial images were obtained from the base of the skull through the vertex without contrast.  Comparison: 02/09/2012.  Findings: There is a large cerebellar hematoma which is fairly midline but does extend asymmetric to the right.  It could be  a hemorrhagic infarct, hypertensive hemorrhage or post-traumatic hematoma.  There is mild mass effect on the fourth ventricle.  No hydrocephalus.  There may be a small amount of blood on the right tentorium.  The bony structures are intact.  The paranasal sinuses and mastoid air cells are clear.  IMPRESSION: 5 x 3.5 cm cerebellar hematoma with mild mass effect on the fourth ventricle but no hydrocephalus.   Original Report Authenticated By: Rudie Meyer, M.D.    Dg Chest Port 1 View  10/25/2012  *RADIOLOGY REPORT*  Clinical Data: Short of breath.  Altered mental status.  PORTABLE CHEST - 1 VIEW  Comparison: 12/05/2011.  Findings: Low volume chest.  Basilar atelectasis. Probable pulmonary vascular congestion.  The low volumes accentuate the cardiopericardial silhouette. Monitoring leads are projected over the chest.  Low volumes also produce widening  of the upper mediastinum.  Trachea appears within normal limits.  IMPRESSION: Low volume chest with probable pulmonary vascular congestion.   Original Report Authenticated By: Andreas Newport, M.D.     Assessment/Plan: Diagnosis: midline to right cerebellar hemorrhage 1. Does the need for close, 24 hr/day medical supervision in concert with the patient's rehab needs make it unreasonable for this patient to be served in a less intensive setting? Yes 2. Co-Morbidities requiring supervision/potential complications: htn, dm 3. Due to bladder management, bowel management, safety, skin/wound care, disease management, medication administration, pain management and patient education, does the patient require 24 hr/day rehab nursing? Yes 4. Does the patient require coordinated care of a physician, rehab nurse, PT (1-2 hrs/day, 5 days/week), OT (1-2 hrs/day, 5 days/week) and SLP (1-2 hrs/day, 5 days/week) to address physical and functional deficits in the context of the above medical diagnosis(es)? Yes Addressing deficits in the following areas: balance, endurance,  locomotion, strength, transferring, bowel/bladder control, bathing, dressing, feeding, grooming, toileting, speech, language, swallowing and psychosocial support 5. Can the patient actively participate in an intensive therapy program of at least 3 hrs of therapy per day at least 5 days per week? Yes 6. The potential for patient to make measurable gains while on inpatient rehab is excellent 7. Anticipated functional outcomes upon discharge from inpatient rehab are supervision to min assist with PT, minimal assist with OT, supervision to min assist with SLP. 8. Estimated rehab length of stay to reach the above functional goals is: 2-3 weeks 9. Does the patient have adequate social supports to accommodate these discharge functional goals? Potentially 10. Anticipated D/C setting: Home 11. Anticipated post D/C treatments: HH therapy 12. Overall Rehab/Functional Prognosis: excellent  RECOMMENDATIONS: This patient's condition is appropriate for continued rehabilitative care in the following setting: CIR Patient has agreed to participate in recommended program. Yes and Potentially Note that insurance prior authorization may be required for reimbursement for recommended care.  Comment:Rehab RN to follow up.   Ranelle Oyster, MD, [place] [facility]     10/26/2012

## 2012-10-26 NOTE — Evaluation (Signed)
Clinical/Bedside Swallow Evaluation [name] MRN: [id number] Date of Birth: March 27, 1935  Today's Date: 10/26/2012 Time: 1030-1040 SLP Time Calculation (min): 10 min  Past Medical History:  Past Medical History  Diagnosis Date  . Hypertension   . Diabetes mellitus    Past Surgical History:  Past Surgical History  Procedure Laterality Date  . Heart stents  x3  . Cardiac surgery    . Hernia repair    . Rotator cuff repair     HPI:  Bill Jackson is a 78 y.o. male history of hypertension and diabetes mellitus as well as alcohol abuse, presenting with acute onset of nausea and vomiting and complained of dizziness as well as ataxia 3/24. There is no previous history of stroke nor TIA. CT scan of the head showed a 5 x 3.5 cm acute cerebellar hemorrhage with mild mass effect on fourth ventricle. Pt had a suboccipital Craniectomy for Evacuation of Cerebellar Hematoma 3/25.   Assessment / Plan / Recommendation Clinical Impression  Patient presents with a moderate oropharyngeal dysphagia. Pt with audible swallow on trials of thin liquids indicative of a delay in swallow initiation with s/s of aspiration apparent as evaluation progressed evidenced by immediate throat clear and cough. Pt required mod-max verbal cues to swallow trials of puree as pt not maintaining LOA. Pt required max SLP verbal and tactile cues to maintain adequate LOA during evaluation. SLP recommends pt remain NPO at this time and will f/u at bedside next date for po trials with pt demonstrating increased LOA for possible diet advancement. Communicated findings with RN.    Aspiration Risk  Moderate    Diet Recommendation NPO   Medication Administration: Via alternative means    Other  Recommendations Oral Care Recommendations: Oral care BID Other Recommendations: Remove water pitcher;Prohibited food (jello, ice cream, thin soups);Clarify dietary restrictions   Follow Up Recommendations  Other  (comment) (will f/u at bedside next date)    Frequency and Duration min 2x/week  2 weeks   Pertinent Vitals/Pain None reported    SLP Swallow Goals Goal #3: Patient will maintain LOA to participate during therapeutic po trials for possible diet advancement with mod assist. Swallow Study Goal #3 - Progress:  (new goal)   Swallow Study Prior Functional Status       General Date of Onset: 10/24/12 HPI: Bill Jackson is a 78 y.o. male history of hypertension and diabetes mellitus as well as alcohol abuse, presenting with acute onset of nausea and vomiting and complained of dizziness as well as ataxia 3/24. There is no previous history of stroke nor TIA. CT scan of the head showed a 5 x 3.5 cm acute cerebellar hemorrhage with mild mass effect on fourth ventricle. Pt had a suboccipital Craniectomy for Evacuation of Cerebellar Hematoma 3/25. Type of Study: Bedside swallow evaluation Previous Swallow Assessment: none found Diet Prior to this Study: NPO Temperature Spikes Noted: Yes Respiratory Status: Supplemental O2 delivered via (comment) (nasal cannula) History of Recent Intubation: Yes Length of Intubations (days):  (for surgery) Behavior/Cognition: Lethargic;Requires cueing;Decreased sustained attention;Impulsive Oral Cavity - Dentition: Missing dentition (natural bottom, edentulous top) Self-Feeding Abilities: Able to feed self;Needs assist Patient Positioning: Upright in bed Baseline Vocal Quality: Clear;Low vocal intensity Volitional Cough: Weak Volitional Swallow: Able to elicit    Oral/Motor/Sensory Function Overall Oral Motor/Sensory Function: Appears within functional limits for tasks assessed Labial ROM: Within Functional Limits Labial Symmetry: Within Functional Limits Labial Strength: Within Functional Limits Labial Sensation: Within Functional  Limits Lingual ROM: Within Functional Limits Lingual Symmetry: Within Functional Limits Lingual Strength: Within Functional  Limits Lingual Sensation: Within Functional Limits Facial ROM: Within Functional Limits Facial Symmetry: Within Functional Limits Facial Strength: Within Functional Limits Facial Sensation: Within Functional Limits   Ice Chips Ice chips: Within functional limits Presentation: Spoon   Thin Liquid Thin Liquid: Impaired Presentation: Cup;Self Fed (required assistance) Pharyngeal  Phase Impairments: Suspected delayed Swallow;Throat Clearing - Immediate;Throat Clearing - Delayed;Cough - Immediate;Cough - Delayed    Nectar Thick Nectar Thick Liquid: Not tested   Honey Thick Honey Thick Liquid: Not tested   Puree Puree: Impaired Presentation: Spoon Oral Phase Impairments: Poor awareness of bolus Oral Phase Functional Implications: Oral holding;Prolonged oral transit Pharyngeal Phase Impairments: Cough - Delayed;Throat Clearing - Delayed;Suspected delayed Swallow   Solid   GO   Berdine Dance SLP student Solid: Not tested       Berdine Dance 10/26/2012,12:22 PM

## 2012-10-27 ENCOUNTER — Encounter (HOSPITAL_COMMUNITY): Payer: Self-pay | Admitting: Neurosurgery

## 2012-10-27 DIAGNOSIS — I619 Nontraumatic intracerebral hemorrhage, unspecified: Secondary | ICD-10-CM

## 2012-10-27 LAB — CBC
HCT: 41.5 % (ref 39.0–52.0)
MCHC: 34.5 g/dL (ref 30.0–36.0)
MCV: 95.2 fL (ref 78.0–100.0)
RDW: 14.3 % (ref 11.5–15.5)

## 2012-10-27 LAB — BASIC METABOLIC PANEL
BUN: 10 mg/dL (ref 6–23)
Creatinine, Ser: 1.04 mg/dL (ref 0.50–1.35)
GFR calc Af Amer: 78 mL/min — ABNORMAL LOW (ref >90)
GFR calc non Af Amer: 68 mL/min — ABNORMAL LOW (ref >90)

## 2012-10-27 LAB — GLUCOSE, CAPILLARY
Glucose-Capillary: 125 mg/dL — ABNORMAL HIGH (ref 70–99)
Glucose-Capillary: 116 mg/dL — ABNORMAL HIGH (ref 70–99)
Glucose-Capillary: 126 mg/dL — ABNORMAL HIGH (ref 70–99)
Glucose-Capillary: 135 mg/dL — ABNORMAL HIGH (ref 70–99)

## 2012-10-27 MED ORDER — VITAMIN B-1 100 MG PO TABS
100.0000 mg | ORAL_TABLET | Freq: Every day | ORAL | Status: DC
  Administered 2012-10-28: 100 mg via ORAL
  Filled 2012-10-27: qty 1

## 2012-10-27 MED ORDER — PANTOPRAZOLE SODIUM 40 MG PO TBEC
40.0000 mg | DELAYED_RELEASE_TABLET | Freq: Every day | ORAL | Status: DC
  Administered 2012-10-27 – 2012-10-28 (×2): 40 mg via ORAL
  Filled 2012-10-27 (×2): qty 1

## 2012-10-27 MED ORDER — RESOURCE THICKENUP CLEAR PO POWD
ORAL | Status: DC | PRN
  Filled 2012-10-27: qty 125

## 2012-10-27 NOTE — Progress Notes (Signed)
SLP reviewed and agree with student findings.   Allie Gerhold MA, CCC-SLP [phone]    

## 2012-10-27 NOTE — Progress Notes (Signed)
Subjective: Patient reports much better, still sore in head  Objective: Vital signs in last 24 hours: Temp:  [98.4 F (36.9 C)-101.8 F (38.8 C)] 98.6 F (37 C) (03/27 0420) Pulse Rate:  [44-88] 68 (03/27 0700) Resp:  [14-21] 14 (03/27 0700) BP: (109-161)/(51-113) 116/52 mmHg (03/27 0700) SpO2:  [93 %-100 %] 96 % (03/27 0700) Arterial Line BP: (136-176)/(66-81) 147/66 mmHg (03/26 0900)  Intake/Output from previous day: 03/26 0701 - 03/27 0700 In: 1800 [I.V.:1800] Out: 1140 [Urine:1140] Intake/Output this shift:    Physical Exam: Awake, alert, conversant.  Dysarthria improved along with dysmetria.  MAEW. Dressing CDI  Lab Results:  Recent Labs  10/24/12 2327 10/24/12 2340 10/27/12 0440  WBC 7.4  --  10.3  HGB 15.8 16.3 14.3  HCT 43.6 48.0 41.5  PLT 246  --  196   BMET  Recent Labs  10/24/12 2340 10/27/12 0440  NA 139 142  K 3.3* 3.4*  CL 105 106  CO2  --  23  GLUCOSE 215* 115*  BUN 15 10  CREATININE 1.10 1.04  CALCIUM  --  8.3*    Studies/Results: Ct Head Wo Contrast  10/26/2012  *RADIOLOGY REPORT*  Clinical Data: Follow-up.  The patient agitated.  Recent surgical decompression.  CT HEAD WITHOUT CONTRAST  Technique:  Contiguous axial images were obtained from the base of the skull through the vertex without contrast.  Comparison: 10/25/2012.  Findings: Pneumocephalus is present diffusely.  Intraventricular blood is present bilaterally in the lateral ventricle occipital horns.  Occipital craniectomy has been performed.  Surgical clips are present in the craniectomy bed along with gas in the operative bed.  Evacuation of the majority of the cerebellar hematoma in the vermis.  Small amount of hemorrhage remains present in the right cerebellar hemisphere measuring 26 mm x 20 mm.  Mastoid air cells appear clear.  There is a small amount of intraventricular hemorrhage casting in the frontal horns of the lateral ventricles which is new compared to prior.  There is no  remote hemorrhage.  IMPRESSION: Postoperative changes with evacuation of most of the cerebellar hematoma.  Small right cerebellar hematoma measuring 20 mm x 26 mm persists.  Diffuse pneumocephalus.  More prominent blood in the ventricles with stable ventricular size.   Original Report Authenticated By: Andreas Newport, M.D.    Ct Head Wo Contrast  10/25/2012  *RADIOLOGY REPORT*  Clinical Data: Follow up hemorrhage  CT HEAD WITHOUT CONTRAST  Technique:  Contiguous axial images were obtained from the base of the skull through the vertex without contrast.  Comparison: CT 10/24/2012  Findings: Cerebellar hematoma in the cerebellar vermis extending more to the right than the left is unchanged.  The hematoma measures 5.6 x 3.9 cm with surrounding edema.  There is mass effect on the fourth ventricle.  There is a mild amount of intraventricular hemorrhage in the occipital horns bilaterally which is not identified on the prior study.  Ventricles appear slightly more dilated but there is no hydrocephalus.  Continued follow-up of ventricular size is warranted.  There is mild atrophy and mild chronic microvascular ischemia.  IMPRESSION: Cerebellar hemorrhage in the vermis extending to the right is similar in size.  There is new  intraventricular hemorrhage and minimal progressive dilatation of ventricles.   Original Report Authenticated By: Janeece Riggers, M.D.     Assessment/Plan: Much improved.  Observe in ICU today.  Tx to Rehab soon.    LOS: 3 days    Dorian Heckle, MD 10/27/2012, 7:34 AM

## 2012-10-27 NOTE — Progress Notes (Signed)
I will follow up with planning disposition and rehab venue options. [phone]

## 2012-10-27 NOTE — Progress Notes (Signed)
Physical Therapy Treatment Patient Details Name: Bill Jackson MRN: [id number] DOB: 07-Dec-1934 Today's Date: 10/27/2012 Time: 6962-9528 PT Time Calculation (min): 20 min  PT Assessment / Plan / Recommendation Comments on Treatment Session  pt presents with Cerebellar Hematoma s/p Suboccipital Crani.  pt more alert today and with better participation.  OT better able to assess pt's visual deficits with ? peripheral field cuts.  See OT note for further details.  pt still startles easily and is requiring 2 person A for mobility and safety.      Follow Up Recommendations  CIR     Does the patient have the potential to tolerate intense rehabilitation     Barriers to Discharge        Equipment Recommendations  Rolling walker with 5" wheels    Recommendations for Other Services Rehab consult  Frequency Min 3X/week   Plan Discharge plan remains appropriate;Frequency remains appropriate    Precautions / Restrictions Precautions Precautions: Fall Restrictions Weight Bearing Restrictions: No   Pertinent Vitals/Pain Denies pain.      Mobility  Bed Mobility Bed Mobility: Supine to Sit;Sitting - Scoot to Edge of Bed Supine to Sit: 3: Mod assist;HOB flat Sitting - Scoot to Edge of Bed: 3: Mod assist Details for Bed Mobility Assistance: cues for sequencing, Initiation.   Transfers Transfers: Sit to Stand;Stand to Sit Sit to Stand: 1: +2 Total assist;With upper extremity assist;From bed Sit to Stand: Patient Percentage: 50% Stand to Sit: 1: +2 Total assist;With upper extremity assist;To chair/3-in-1 Stand to Sit: Patient Percentage: 40% Details for Transfer Assistance: cues for use of UEs, anterior wt shifting with sit to stand.  pt with posterior lean.  hand over hand needed to use  UEs on armrests of chair to sit and control descent.   Ambulation/Gait Ambulation/Gait Assistance: 1: +2 Total assist Ambulation/Gait: Patient Percentage: 50% Ambulation Distance (Feet): 10  Feet Assistive device: 2 person hand held assist Ambulation/Gait Assistance Details: pt with posterior lean and extended, rigid posture, ataxic gait.  ? visual deficits vs having proprioceptive deficits.   Gait Pattern: Step-through pattern;Decreased stride length;Ataxic;Narrow base of support Stairs: No Wheelchair Mobility Wheelchair Mobility: No    Exercises     PT Diagnosis:    PT Problem List:   PT Treatment Interventions:     PT Goals Acute Rehab PT Goals Time For Goal Achievement: 11/09/12 Potential to Achieve Goals: Good PT Goal: Supine/Side to Sit - Progress: Progressing toward goal PT Goal: Sit to Stand - Progress: Progressing toward goal PT Goal: Stand to Sit - Progress: Progressing toward goal PT Transfer Goal: Bed to Chair/Chair to Bed - Progress: Progressing toward goal PT Goal: Ambulate - Progress: Progressing toward goal  Visit Information  Last PT Received On: 10/27/12 Assistance Needed: +2 PT/OT Co-Evaluation/Treatment: Yes    Subjective Data  Subjective: I'm gonna fall!     Cognition  Cognition Overall Cognitive Status: Impaired Area of Impairment: Attention;Memory;Safety/judgement;Awareness of deficits Arousal/Alertness: Awake/alert Orientation Level: Disoriented to;Place;Time;Situation Behavior During Session: Restless Current Attention Level: Focused Memory: Decreased recall of precautions Memory Deficits: no recall of location, reason for admission or surgery Safety/Judgement: Decreased awareness of safety precautions;Decreased safety judgement for tasks assessed;Impulsive;Decreased awareness of need for assistance    Balance  Balance Balance Assessed: Yes Static Standing Balance Static Standing - Balance Support: Bilateral upper extremity supported;During functional activity Static Standing - Level of Assistance: 1: +2 Total assist  End of Session PT - End of Session Equipment Utilized During Treatment: Gait belt Activity Tolerance:  Patient  limited by fatigue Patient left: in chair;with call bell/phone within reach;with family/visitor present Nurse Communication: Mobility status   GP     Sunny Schlein, [place] [phone] 10/27/2012, 2:30 PM

## 2012-10-27 NOTE — Progress Notes (Signed)
Stroke Team Progress Note  HISTORY Bill Jackson is an 78 y.o. male history of hypertension and diabetes mellitus as well as alcohol abuse, presenting 10/24/2012 with acute onset of nausea and vomiting and complained of dizziness as well as ataxia. He was last known well at 2145 on 10/24/2012. There is no previous history of stroke nor TIA. He has not been on antiplatelet therapy no anticoagulation. CT scan of the head showed a 5 x 3.5 cm acute cerebellar hemorrhage with mild mass effect on fourth ventricle. Fourth ventricle is patent and patient had no signs of hydrocephalus. Patient was not a TPA candidate secondary to hemorrhage. He was admitted to the neuro ICU for further evaluation and treatment.  SUBJECTIVE Patient in bed, talkative, confused.  OBJECTIVE Most recent Vital Signs: Filed Vitals:   10/27/12 0600 10/27/12 0700 10/27/12 0800 10/27/12 0807  BP: 115/55 116/52 136/66   Pulse: 71 68 82   Temp:    99.2 F (37.3 C)  TempSrc:    Oral  Resp: 16 14 18    Height:      Weight:      SpO2: 96% 96% 98%    CBG (last 3)   Recent Labs  10/27/12 0012 10/27/12 0419 10/27/12 0803  GLUCAP 125* 116* 126*    IV Fluid Intake:   . sodium chloride 75 mL/hr at 10/27/12 0800  . dexmedetomidine 0.478 mcg/kg/hr (10/25/12 1830)  . dexmedetomidine Stopped (10/26/12 0800)    MEDICATIONS  . antiseptic oral rinse  15 mL Mouth Rinse q12n4p  . benazepril  40 mg Oral Daily  . chlorhexidine  15 mL Mouth Rinse BID  . folic acid  1 mg Oral Daily  . glipiZIDE  10 mg Oral QAC breakfast  . insulin aspart  0-15 Units Subcutaneous Q4H  . metFORMIN  1,000 mg Oral Q breakfast  . multivitamin with minerals  1 tablet Oral Daily  . pantoprazole (PROTONIX) IV  40 mg Intravenous QHS  . senna-docusate  1 tablet Oral BID  . thiamine  100 mg Intravenous Daily   PRN:  acetaminophen, acetaminophen, labetalol, LORazepam, LORazepam, morphine injection, ondansetron (ZOFRAN) IV  Diet:  NPO  Activity:   OOB DVT Prophylaxis:  SCDs   CLINICALLY SIGNIFICANT STUDIES Basic Metabolic Panel:   Recent Labs Lab 10/24/12 2340 10/27/12 0440  NA 139 142  K 3.3* 3.4*  CL 105 106  CO2  --  23  GLUCOSE 215* 115*  BUN 15 10  CREATININE 1.10 1.04  CALCIUM  --  8.3*   Liver Function Tests: No results found for this basename: AST, ALT, ALKPHOS, BILITOT, PROT, ALBUMIN,  in the last 168 hours CBC:   Recent Labs Lab 10/24/12 2327 10/24/12 2340 10/27/12 0440  WBC 7.4  --  10.3  HGB 15.8 16.3 14.3  HCT 43.6 48.0 41.5  MCV 92.4  --  95.2  PLT 246  --  196   Coagulation:   Recent Labs Lab 10/24/12 2327  LABPROT 12.9  INR 0.98   Cardiac Enzymes:   Recent Labs Lab 10/25/12 0345 10/25/12 0700  TROPONINI <0.30 <0.30   Urinalysis:   Recent Labs Lab 10/25/12 0047  COLORURINE YELLOW  LABSPEC 1.019  PHURINE 5.0  GLUCOSEU >1000*  HGBUR TRACE*  BILIRUBINUR NEGATIVE  KETONESUR 15*  PROTEINUR NEGATIVE  UROBILINOGEN 0.2  NITRITE NEGATIVE  LEUKOCYTESUR NEGATIVE   Lipid Panel No results found for this basename: chol,  trig,  hdl,  cholhdl,  vldl,  ldlcalc   HgbA1C  Lab  Results  Component Value Date   HGBA1C 6.8* 10/25/2012   Urine Drug Screen:   No results found for this basename: labopia,  cocainscrnur,  labbenz,  amphetmu,  thcu,  labbarb    Alcohol Level:   Recent Labs Lab 10/24/12 2327  ETH 23*    CT of the brain  10/26/2012 Postoperative changes with evacuation of most of the cerebellar hematoma. Small right cerebellar hematoma measuring 20 mm x 26 mm persists. Diffuse pneumocephalus. More prominent blood in the ventricles with stable ventricular size. 10/25/2012 Cerebellar hemorrhage in the vermis extending to the right is similar in size. There is new intraventricular hemorrhage and minimal progressive dilatation of ventricles.  10/24/2012  5 x 3.5 cm cerebellar hematoma with mild mass effect on the fourth ventricle but no hydrocephalus.    MRI of the brain     MRA of the brain    CXR  10/25/2012  Low volume chest with probable pulmonary vascular congestion.   EKG  normal sinus rhythm  Therapy Recommendations CIR  Physical Exam   Elderly African American male not in distress. . Afebrile. Head is nontraumatic. Neck is supple without bruit. Hearing is normal. Cardiac exam no murmur or gallop. Lungs are clear to auscultation. Distal pulses are well felt. Neurological Exam ; drowsy but opens eyes easily and follow simple commands. Pupils 3 mm equal reactive. Extraocular moments are full range but saccadic dysmetria   on either direction when looking laterally. Fundi could not be visualized. He has significant dysarthria with started to speech. No facial weakness. Tongue is midline. Cough and gag appear weak. He can move all 4 extremities well against gravity but has significant right finger-to-nose dysmetria with almost a violent tremor when he approaches his nose. Mild proximal lower extremity weakness bilaterally. Deep tendon flexes are depressed. Plantars are downgoing. Sensation appears intact. Gait was not tested.  ASSESSMENT Mr. Bill Jackson is a 78 y.o. male presenting with nausea, vomiting, dizziness and ataxia. Imaging confirms a midline large cerebellar hemorrhage with mass effect on the fourth ventricle. Hemorrhage felt to be secondary to accelerated Hypertension; highest BP 201/94 On no antiplatelets prior to admission. Patient with resultant right hand/arm severe dysmetria, scanning dysarthria, upgaze paralysis, lethargic. Given acute hemorrhage, patient at risk for hemorrhage, extension, cerebral edema, herniation, obstructive hydrocephalus, therefore Dr. [name] performed a Suboccipital Craniectomy for Evacuation of Cerebellar Hematoma with microdissection 10/25/2012, POD #2.    Hypokalemia, 3.4 Headache secondary to stroke/hmg Nausea, wretching secondary to stroke/hmg Hypertension Diabetes, HgbA1c 6.7 CAD - OHS, cardiac stents Cigarette  smoker etoh use - concerned with possible withdrawal  Hospital day # 3  TREATMENT/PLAN  Continue ICU level care  Check swallow  OOB, rehab consult  Resume home meds  Start diet  Annie Main, MSN, RN, ANVP-BC, ANP-BC, [facility] Pager: [phone] 10/27/2012 9:15 AM I have personally obtained a history, examined the patient, evaluated imaging results, and formulated the assessment and plan of care. I agree with the above. Delia Heady, MD

## 2012-10-27 NOTE — Progress Notes (Signed)
Speech Language Pathology Dysphagia Treatment Patient Details Name: Bill Jackson MRN: [id number] DOB: [birth year] Today's Date: 10/27/2012 Time: 1191-4782 SLP Time Calculation (min): 24 min  Assessment / Plan / Recommendation Clinical Impression  Treatment focused on skilled observation with therapeutic trials of po's for possible diet advancement. Pt demonstrates s/s of aspiration with consumption of thin liquids with both immediate and delayed cough on 3/7 trials, however pt with no overt s/s of aspiration with purees and nectar thick liquids aside from 1x delayed cough. RN reports pt with intermitent coughing since admit 3/24. SLP provided mod verbal, visual and tactile cues for use of compensatory strategies and recommends pt upgrade to dys-3, nectar thick liquids with full supervision for use of compensatory strategies. SLP will f/u at bedside for diet tolerance, use of aspiration precautions, and potential to advance. Prognosis for advancement good with continued improvement in level of alertness and oral abilitites. Communicated diet recommendation to RN and patient.    Diet Recommendation  Initiate / Change Diet: Dysphagia 3 (mechanical soft);Nectar-thick liquid    SLP Plan Continue with current plan of care   Pertinent Vitals/Pain None reported   Swallowing Goals  SLP Swallowing Goals Patient will utilize recommended strategies during swallow to increase swallowing safety with: Moderate assistance Swallow Study Goal #2 - Progress:  (new goal) Goal #3: Patient will maintain LOA to participate during therapeutic po trials for possible diet advancement with mod assist. Swallow Study Goal #3 - Progress: Met  General Temperature Spikes Noted: Yes Respiratory Status: Room air Behavior/Cognition: Alert;Cooperative;Requires cueing Oral Cavity - Dentition: Missing dentition Patient Positioning: Upright in bed  Oral Cavity - Oral Hygiene Patient is HIGH RISK - Oral Care Protocol  followed (see row info): Yes   Dysphagia Treatment Treatment focused on: Upgraded PO texture trials;Patient/family/caregiver education;Utilization of compensatory strategies Treatment Methods/Modalities: Skilled observation Patient observed directly with PO's: Yes Type of PO's observed: Dysphagia 3 (soft);Dysphagia 1 (puree);Thin liquids;Nectar-thick liquids Feeding: Able to feed self;Needs assist Liquids provided via: Cup;No straw Pharyngeal Phase Signs & Symptoms: Suspected delayed swallow initiation;Audible swallow;Delayed throat clear;Delayed cough Type of cueing: Tactile;Verbal;Visual Amount of cueing: Moderate   GO   Berdine Dance SLP student   Berdine Dance 10/27/2012, 10:15 AM

## 2012-10-27 NOTE — Evaluation (Signed)
Speech Language Pathology Evaluation Patient Details Name: Bill Jackson MRN: [id number] DOB: 1934-10-13 Today's Date: 10/27/2012 Time:  9:06- 9:30    Problem List:  Patient Active Problem List  Diagnosis  . Type 2 diabetes mellitus  . Hypertension   Past Medical History:  Past Medical History  Diagnosis Date  . Hypertension   . Diabetes mellitus    Past Surgical History:  Past Surgical History  Procedure Laterality Date  . Heart stents  x3  . Cardiac surgery    . Hernia repair    . Rotator cuff repair    . Craniectomy N/A 10/25/2012    Procedure: Suboccipital Craniectomy for Evacuation of Cerebellar Hematoma;  Surgeon: Maeola Harman, MD;  Location: [facility];  Service: Neurosurgery;  Laterality: N/A;  Suboccipital Craniectomy for Evacuation of Cerebellar Hematoma   HPI: Bill Jackson is a 78 y.o. male history of hypertension and diabetes mellitus as well as alcohol abuse, presenting with acute onset of nausea and vomiting and complained of dizziness as well as ataxia 3/24. There is no previous history of stroke nor TIA. CT scan of the head showed a 5 x 3.5 cm acute cerebellar hemorrhage with mild mass effect on fourth ventricle. Pt had a suboccipital Craniectomy for Evacuation of Cerebellar Hematoma 3/25.      Assessment / Plan / Recommendation Clinical Impression    Patient presents with cognitive-linguisitic deficits in the areas of reasoning, judgment/safety, awareness, attention, auditory comprehension, and exhibits a mod-severe dysarthria effecting speech intelligibility. Pt observed with both simple functional tasks and mod complex tasks, in which he consistently demonstrates the inability to appropriately and safely function with ADL's independently despite SLP provided mod-max verbal cues. Speech intelligibility with min-mod improvements with SLP verbal cues to speak at a slower rate with overarticulation. SLP will continue to f/u in acute setting for  cognitive-linguistic and dysarthria tx and recommends CIR for further tx.     SLP Assessment    Patient needs continued Speech Lanaguage Pathology Services    Follow Up Recommendations    Inpatient Rehab   Frequency and Duration   2x a week, 2 weeks     Pertinent Vitals/Pain None reported   SLP Goals  SLP Goals Potential to Achieve Goals: Good SLP Goal #1: Pt will increase speech intelligibility by overarticulating single words with mod verbal cues SLP Goal #1 - Progress:  (new goal) SLP Goal #2: Patient will sustain attention to mildly complex with moderate verbal cues  SLP Goal #2 - Progress:  (new goal) SLP Goal #3: Patient will complete problem solving tasks during ADL's wtih mod verbal cues SLP Goal #3 - Progress:  (new goal)  SLP Evaluation Prior Functioning      Cognition  Arousal/Alertness: Awake/alert Orientation Level: Oriented to person;Oriented to place;Disoriented to time;Oriented to situation    Comprehension       Expression     Oral / Motor     GO    Berdine Dance SLP student  Berdine Dance 10/27/2012, 3:55 PM

## 2012-10-27 NOTE — Progress Notes (Signed)
I met with patient and his friend, [name], at bedside. Patient states [name] is his POA. He will be bringing his POA papers for chart. Timothy plans for pt to go home with him at d/c and provide 24/7 care. I will begin insurance authorization to admit pt as soon as felt medically ready and bed is available. [phone]

## 2012-10-27 NOTE — Progress Notes (Signed)
Occupational Therapy Treatment Patient Details Name: Bill Jackson MRN: [id number] DOB: 09/01/1934 Today's Date: 10/27/2012 Time: 1478-2956 OT Time Calculation (min): 21 min  OT Assessment / Plan / Recommendation Comments on Treatment Session Pt with incr arousal this session and incr progression.     Follow Up Recommendations  CIR    Barriers to Discharge       Equipment Recommendations  3 in 1 bedside comode;Wheelchair (measurements OT);Wheelchair cushion (measurements OT)    Recommendations for Other Services Rehab consult  Frequency Min 2X/week   Plan Discharge plan remains appropriate    Precautions / Restrictions Precautions Precautions: Fall Restrictions Weight Bearing Restrictions: No   Pertinent Vitals/Pain     ADL  Grooming: Wash/dry face;Min guard Where Assessed - Grooming: Supported sitting (friend with v/c x2 to complete task) Toilet Transfer: +2 Total assistance Toilet Transfer: Patient Percentage: 40% Acupuncturist: Raised toilet seat with arms (or 3-in-1 over toilet) Equipment Used: Gait belt;Rolling walker Transfers/Ambulation Related to ADLs: Pt with posterior lean and ataxic gait. see Pt notes ADL Comments: Pt supine on arrival and agreeable to OOB. pt needed repetition of cues to progress to EOB. Pt responds to friend cues and without agtitation. Pt with visual deficits with perpherial vision. Pt using central vision    OT Diagnosis:    OT Problem List:   OT Treatment Interventions:     OT Goals Acute Rehab OT Goals OT Goal Formulation: Patient unable to participate in goal setting Time For Goal Achievement: 11/09/12 Potential to Achieve Goals: Good ADL Goals Pt Will Perform Grooming: with set-up;Sitting, chair;Supported ADL Goal: Grooming - Progress: Progressing toward goals Pt Will Perform Upper Body Bathing: with mod assist;Sitting, chair;Supported Pt Will Perform Upper Body Dressing: with mod assist;Sitting, chair;Supported Retail buyer to Toilet: with max assist;Ambulation;3-in-1 ADL Goal: Statistician - Progress: Progressing toward goals Miscellaneous OT Goals Miscellaneous OT Goal #1: Pt will demonstrate sustained attention for ~30 seconds to adl task OT Goal: Miscellaneous Goal #1 - Progress: Progressing toward goals Miscellaneous OT Goal #2: Pt will sit EOB for ~5 minutes with adl task with min (A)  OT Goal: Miscellaneous Goal #2 - Progress: Progressing toward goals  Visit Information  Last OT Received On: 10/27/12 Assistance Needed: +2 PT/OT Co-Evaluation/Treatment: Yes    Subjective Data      Prior Functioning       Cognition  Cognition Overall Cognitive Status: Impaired Area of Impairment: Attention;Memory;Safety/judgement;Awareness of deficits Arousal/Alertness: Awake/alert Orientation Level: Disoriented to;Place;Time;Situation Behavior During Session: Restless Current Attention Level: Focused Memory: Decreased recall of precautions Memory Deficits: no recall of location, reason for admission or surgery Safety/Judgement: Decreased awareness of safety precautions;Decreased safety judgement for tasks assessed;Impulsive;Decreased awareness of need for assistance    Mobility  Bed Mobility Bed Mobility: Supine to Sit;Sitting - Scoot to Edge of Bed Supine to Sit: 3: Mod assist;HOB flat Sitting - Scoot to Edge of Bed: 3: Mod assist Details for Bed Mobility Assistance: cues for sequencing, Initiation.   Transfers Sit to Stand: 1: +2 Total assist;With upper extremity assist;From bed Sit to Stand: Patient Percentage: 50% Stand to Sit: 1: +2 Total assist;With upper extremity assist;To chair/3-in-1 Stand to Sit: Patient Percentage: 40% Details for Transfer Assistance: cues for use of UEs, anterior wt shifting with sit to stand.  pt with posterior lean.  hand over hand needed to use  UEs on armrests of chair to sit and control descent.      Exercises      Balance Balance  Balance Assessed:  Yes Static Standing Balance Static Standing - Balance Support: Bilateral upper extremity supported;During functional activity Static Standing - Level of Assistance: 1: +2 Total assist   End of Session OT - End of Session Activity Tolerance: Patient tolerated treatment well Patient left: in chair;with call bell/phone within reach;with family/visitor present Nurse Communication: Mobility status;Precautions  GO     Lucile Shutters 10/27/2012, 3:28 PM Pager: [phone]

## 2012-10-28 ENCOUNTER — Inpatient Hospital Stay (HOSPITAL_COMMUNITY)
Admission: RE | Admit: 2012-10-28 | Discharge: 2012-11-15 | DRG: 945 | Disposition: A | Discharge status: Skilled Nursing Facility | Payer: Medicare Other | Source: Intra-hospital | Attending: Physical Medicine & Rehabilitation | Admitting: Physical Medicine & Rehabilitation

## 2012-10-28 DIAGNOSIS — F1010 Alcohol abuse, uncomplicated: Secondary | ICD-10-CM | POA: Diagnosis present

## 2012-10-28 DIAGNOSIS — M76899 Other specified enthesopathies of unspecified lower limb, excluding foot: Secondary | ICD-10-CM | POA: Diagnosis present

## 2012-10-28 DIAGNOSIS — J90 Pleural effusion, not elsewhere classified: Secondary | ICD-10-CM | POA: Diagnosis present

## 2012-10-28 DIAGNOSIS — M1710 Unilateral primary osteoarthritis, unspecified knee: Secondary | ICD-10-CM | POA: Diagnosis present

## 2012-10-28 DIAGNOSIS — R51 Headache: Secondary | ICD-10-CM | POA: Diagnosis present

## 2012-10-28 DIAGNOSIS — E876 Hypokalemia: Secondary | ICD-10-CM | POA: Diagnosis present

## 2012-10-28 DIAGNOSIS — F29 Unspecified psychosis not due to a substance or known physiological condition: Secondary | ICD-10-CM | POA: Diagnosis present

## 2012-10-28 DIAGNOSIS — H409 Unspecified glaucoma: Secondary | ICD-10-CM | POA: Diagnosis present

## 2012-10-28 DIAGNOSIS — Z5189 Encounter for other specified aftercare: Principal | ICD-10-CM

## 2012-10-28 DIAGNOSIS — I619 Nontraumatic intracerebral hemorrhage, unspecified: Secondary | ICD-10-CM

## 2012-10-28 DIAGNOSIS — I10 Essential (primary) hypertension: Secondary | ICD-10-CM

## 2012-10-28 DIAGNOSIS — I614 Nontraumatic intracerebral hemorrhage in cerebellum: Secondary | ICD-10-CM

## 2012-10-28 DIAGNOSIS — M545 Low back pain, unspecified: Secondary | ICD-10-CM | POA: Diagnosis present

## 2012-10-28 DIAGNOSIS — M179 Osteoarthritis of knee, unspecified: Secondary | ICD-10-CM | POA: Diagnosis present

## 2012-10-28 DIAGNOSIS — IMO0002 Reserved for concepts with insufficient information to code with codable children: Secondary | ICD-10-CM | POA: Diagnosis present

## 2012-10-28 DIAGNOSIS — E871 Hypo-osmolality and hyponatremia: Secondary | ICD-10-CM | POA: Diagnosis present

## 2012-10-28 DIAGNOSIS — E119 Type 2 diabetes mellitus without complications: Secondary | ICD-10-CM | POA: Diagnosis present

## 2012-10-28 DIAGNOSIS — R1310 Dysphagia, unspecified: Secondary | ICD-10-CM | POA: Diagnosis present

## 2012-10-28 DIAGNOSIS — R519 Headache, unspecified: Secondary | ICD-10-CM | POA: Diagnosis present

## 2012-10-28 DIAGNOSIS — Z79899 Other long term (current) drug therapy: Secondary | ICD-10-CM

## 2012-10-28 LAB — GLUCOSE, CAPILLARY
Glucose-Capillary: 143 mg/dL — ABNORMAL HIGH (ref 70–99)
Glucose-Capillary: 176 mg/dL — ABNORMAL HIGH (ref 70–99)

## 2012-10-28 MED ORDER — ACETAMINOPHEN 325 MG PO TABS
325.0000 mg | ORAL_TABLET | ORAL | Status: DC | PRN
  Administered 2012-10-28 – 2012-11-15 (×4): 650 mg via ORAL
  Filled 2012-10-28 (×5): qty 2

## 2012-10-28 MED ORDER — ADULT MULTIVITAMIN W/MINERALS CH
1.0000 | ORAL_TABLET | Freq: Every day | ORAL | Status: DC
  Administered 2012-10-29 – 2012-11-15 (×18): 1 via ORAL
  Filled 2012-10-28 (×19): qty 1

## 2012-10-28 MED ORDER — PROCHLORPERAZINE MALEATE 5 MG PO TABS
5.0000 mg | ORAL_TABLET | Freq: Four times a day (QID) | ORAL | Status: DC | PRN
  Filled 2012-10-28 (×2): qty 2

## 2012-10-28 MED ORDER — RESOURCE THICKENUP CLEAR PO POWD
ORAL | Status: DC | PRN
  Filled 2012-10-28: qty 125

## 2012-10-28 MED ORDER — SENNOSIDES-DOCUSATE SODIUM 8.6-50 MG PO TABS
1.0000 | ORAL_TABLET | Freq: Two times a day (BID) | ORAL | Status: DC
  Administered 2012-10-28 – 2012-11-15 (×31): 1 via ORAL
  Filled 2012-10-28 (×28): qty 1

## 2012-10-28 MED ORDER — METFORMIN HCL 500 MG PO TABS
1000.0000 mg | ORAL_TABLET | Freq: Every day | ORAL | Status: DC
  Administered 2012-10-29 – 2012-10-30 (×2): 1000 mg via ORAL
  Filled 2012-10-28 (×4): qty 2

## 2012-10-28 MED ORDER — GUAIFENESIN-DM 100-10 MG/5ML PO SYRP: 5.0000 mL | ORAL_SOLUTION | Freq: Four times a day (QID) | ORAL | Status: DC | PRN

## 2012-10-28 MED ORDER — INSULIN ASPART 100 UNIT/ML ~~LOC~~ SOLN: 0.0000 [IU] | SUBCUTANEOUS | Status: DC

## 2012-10-28 MED ORDER — INSULIN ASPART 100 UNIT/ML ~~LOC~~ SOLN
0.0000 [IU] | Freq: Three times a day (TID) | SUBCUTANEOUS | Status: DC
  Administered 2012-10-28: 2 [IU] via SUBCUTANEOUS
  Administered 2012-10-29 (×2): 3 [IU] via SUBCUTANEOUS
  Administered 2012-10-29 – 2012-10-30 (×2): 2 [IU] via SUBCUTANEOUS
  Administered 2012-10-30: 3 [IU] via SUBCUTANEOUS
  Administered 2012-10-30 – 2012-10-31 (×2): 2 [IU] via SUBCUTANEOUS
  Administered 2012-10-31: 1 [IU] via SUBCUTANEOUS
  Administered 2012-10-31: 3 [IU] via SUBCUTANEOUS
  Administered 2012-11-01 – 2012-11-02 (×5): 2 [IU] via SUBCUTANEOUS
  Administered 2012-11-02 – 2012-11-03 (×2): 1 [IU] via SUBCUTANEOUS
  Administered 2012-11-03 (×2): 2 [IU] via SUBCUTANEOUS
  Administered 2012-11-04 (×2): 3 [IU] via SUBCUTANEOUS
  Administered 2012-11-04: 1 [IU] via SUBCUTANEOUS
  Administered 2012-11-05: 3 [IU] via SUBCUTANEOUS
  Administered 2012-11-05 – 2012-11-07 (×6): 2 [IU] via SUBCUTANEOUS
  Administered 2012-11-07 – 2012-11-08 (×3): 1 [IU] via SUBCUTANEOUS
  Administered 2012-11-08: 3 [IU] via SUBCUTANEOUS
  Administered 2012-11-08: 1 [IU] via SUBCUTANEOUS
  Administered 2012-11-09: 3 [IU] via SUBCUTANEOUS
  Administered 2012-11-09: 5 [IU] via SUBCUTANEOUS
  Administered 2012-11-10 (×2): 3 [IU] via SUBCUTANEOUS
  Administered 2012-11-11: 2 [IU] via SUBCUTANEOUS
  Administered 2012-11-11: 3 [IU] via SUBCUTANEOUS
  Administered 2012-11-11: 1 [IU] via SUBCUTANEOUS
  Administered 2012-11-12: 2 [IU] via SUBCUTANEOUS
  Administered 2012-11-12: 1 [IU] via SUBCUTANEOUS
  Administered 2012-11-12 – 2012-11-13 (×2): 2 [IU] via SUBCUTANEOUS
  Administered 2012-11-13: 3 [IU] via SUBCUTANEOUS
  Administered 2012-11-13: 1 [IU] via SUBCUTANEOUS
  Administered 2012-11-14: 2 [IU] via SUBCUTANEOUS
  Administered 2012-11-14: 1 [IU] via SUBCUTANEOUS
  Administered 2012-11-14: 3 [IU] via SUBCUTANEOUS
  Administered 2012-11-15 (×2): 2 [IU] via SUBCUTANEOUS

## 2012-10-28 MED ORDER — BISACODYL 10 MG RE SUPP: 10.0000 mg | Freq: Every day | RECTAL | Status: DC | PRN

## 2012-10-28 MED ORDER — PROCHLORPERAZINE 25 MG RE SUPP
12.5000 mg | Freq: Four times a day (QID) | RECTAL | Status: DC | PRN
  Filled 2012-10-28 (×2): qty 1

## 2012-10-28 MED ORDER — PANTOPRAZOLE SODIUM 40 MG PO TBEC
40.0000 mg | DELAYED_RELEASE_TABLET | Freq: Every day | ORAL | Status: DC
  Filled 2012-10-28: qty 1

## 2012-10-28 MED ORDER — INSULIN ASPART 100 UNIT/ML ~~LOC~~ SOLN
0.0000 [IU] | Freq: Every day | SUBCUTANEOUS | Status: DC
  Administered 2012-10-31: 2 [IU] via SUBCUTANEOUS

## 2012-10-28 MED ORDER — GLIPIZIDE 10 MG PO TABS
10.0000 mg | ORAL_TABLET | Freq: Every day | ORAL | Status: DC
  Administered 2012-10-29 – 2012-10-31 (×3): 10 mg via ORAL
  Filled 2012-10-28 (×5): qty 1

## 2012-10-28 MED ORDER — FLEET ENEMA 7-19 GM/118ML RE ENEM: 1.0000 | ENEMA | Freq: Once | RECTAL | Status: AC | PRN

## 2012-10-28 MED ORDER — ALUM & MAG HYDROXIDE-SIMETH 200-200-20 MG/5ML PO SUSP: 30.0000 mL | ORAL | Status: DC | PRN

## 2012-10-28 MED ORDER — CHLORHEXIDINE GLUCONATE 0.12 % MT SOLN
15.0000 mL | Freq: Two times a day (BID) | OROMUCOSAL | Status: DC
  Administered 2012-10-29 – 2012-11-15 (×33): 15 mL via OROMUCOSAL
  Filled 2012-10-28 (×38): qty 15

## 2012-10-28 MED ORDER — SENNOSIDES-DOCUSATE SODIUM 8.6-50 MG PO TABS
1.0000 | ORAL_TABLET | Freq: Every evening | ORAL | Status: DC | PRN
  Filled 2012-10-28 (×5): qty 1

## 2012-10-28 MED ORDER — TRAZODONE HCL 50 MG PO TABS
25.0000 mg | ORAL_TABLET | Freq: Every evening | ORAL | Status: DC | PRN
  Administered 2012-10-30: 50 mg via ORAL
  Filled 2012-10-28: qty 1

## 2012-10-28 MED ORDER — BIOTENE DRY MOUTH MT LIQD
15.0000 mL | Freq: Two times a day (BID) | OROMUCOSAL | Status: DC
  Administered 2012-10-28 – 2012-11-15 (×30): 15 mL via OROMUCOSAL

## 2012-10-28 MED ORDER — VITAMIN B-1 100 MG PO TABS
100.0000 mg | ORAL_TABLET | Freq: Every day | ORAL | Status: DC
  Administered 2012-10-29 – 2012-11-15 (×18): 100 mg via ORAL
  Filled 2012-10-28 (×19): qty 1

## 2012-10-28 MED ORDER — SODIUM CHLORIDE 0.9 % IV SOLN
INTRAVENOUS | Status: DC
  Administered 2012-10-28 – 2012-11-07 (×11): via INTRAVENOUS
  Filled 2012-10-28 (×14): qty 1000

## 2012-10-28 MED ORDER — PROCHLORPERAZINE EDISYLATE 5 MG/ML IJ SOLN
5.0000 mg | Freq: Four times a day (QID) | INTRAMUSCULAR | Status: DC | PRN
  Administered 2012-10-29: 10 mg via INTRAMUSCULAR
  Filled 2012-10-28 (×2): qty 2

## 2012-10-28 MED ORDER — DIPHENHYDRAMINE HCL 12.5 MG/5ML PO ELIX: 12.5000 mg | ORAL_SOLUTION | Freq: Four times a day (QID) | ORAL | Status: DC | PRN

## 2012-10-28 MED ORDER — BENAZEPRIL HCL 40 MG PO TABS
40.0000 mg | ORAL_TABLET | Freq: Every day | ORAL | Status: DC
  Filled 2012-10-28 (×3): qty 1

## 2012-10-28 MED ORDER — FOLIC ACID 1 MG PO TABS
1.0000 mg | ORAL_TABLET | Freq: Every day | ORAL | Status: DC
  Administered 2012-10-29 – 2012-11-15 (×18): 1 mg via ORAL
  Filled 2012-10-28 (×20): qty 1

## 2012-10-28 NOTE — Interval H&P Note (Signed)
Bill Jackson was admitted today to Inpatient Rehabilitation with the diagnosis of cerebellar hemorrhage.  The patient's history has been reviewed, patient examined, and there is no change in status.  Patient continues to be appropriate for intensive inpatient rehabilitation.  I have reviewed the patient's chart and labs.  Questions were answered to the patient's satisfaction.  Bill Jackson T 10/28/2012, 9:37 PM

## 2012-10-28 NOTE — Discharge Summary (Signed)
Stroke Discharge Summary  Patient ID: Bill Jackson   MRN: [id number]      DOB: 1934/09/25  Date of Admission: 10/24/2012 Date of Discharge: 10/28/2012  Attending Physician:  Darcella Cheshire, MD, Stroke MD  Consulting Physician(s):  Treatment Team:  Maeola Harman, MD rehabilitation medicine  Patient's PCP:  Pcp Not In System  Discharge Diagnoses:  Principal Problem:   Acute cerebellar hemorrhage Active Problems:   Type 2 diabetes mellitus   Hypertension   OA (osteoarthritis) of knee-right   Accelerated hypertension   Hypokalemia   Encounter for long-term (current) use of medications BMI  Body mass index is 29.97 kg/(m^2).  Past Medical History  Diagnosis Date  . Hypertension   . Diabetes mellitus    Past Surgical History  Procedure Laterality Date  . Heart stents  x3  . Cardiac surgery    . Hernia repair    . Rotator cuff repair    . Craniectomy N/A 10/25/2012    Procedure: Suboccipital Craniectomy for Evacuation of Cerebellar Hematoma;  Surgeon: Maeola Harman, MD;  Location: [facility];  Service: Neurosurgery;  Laterality: N/A;  Suboccipital Craniectomy for Evacuation of Cerebellar Hematoma    Medications to be continued on Rehab . antiseptic oral rinse  15 mL Mouth Rinse q12n4p  . benazepril  40 mg Oral Daily  . chlorhexidine  15 mL Mouth Rinse BID  . folic acid  1 mg Oral Daily  . glipiZIDE  10 mg Oral QAC breakfast  . insulin aspart  0-15 Units Subcutaneous Q4H  . metFORMIN  1,000 mg Oral Q breakfast  . multivitamin with minerals  1 tablet Oral Daily  . pantoprazole  40 mg Oral Daily  . senna-docusate  1 tablet Oral BID  . thiamine  100 mg Oral Daily    LABORATORY STUDIES CBC    Component Value Date/Time   WBC 10.3 10/27/2012 0440   RBC 4.36 10/27/2012 0440   HGB 14.3 10/27/2012 0440   HCT 41.5 10/27/2012 0440   PLT 196 10/27/2012 0440   MCV 95.2 10/27/2012 0440   MCH 32.8 10/27/2012 0440   MCHC 34.5 10/27/2012 0440   RDW 14.3 10/27/2012 0440    LYMPHSABS 2.1 02/09/2012 2100   MONOABS 0.2 02/09/2012 2100   EOSABS 0.0 02/09/2012 2100   BASOSABS 0.0 02/09/2012 2100   CMP    Component Value Date/Time   NA 142 10/27/2012 0440   K 3.4* 10/27/2012 0440   CL 106 10/27/2012 0440   CO2 23 10/27/2012 0440   GLUCOSE 115* 10/27/2012 0440   BUN 10 10/27/2012 0440   CREATININE 1.04 10/27/2012 0440   CALCIUM 8.3* 10/27/2012 0440   GFRNONAA 68* 10/27/2012 0440   GFRAA 78* 10/27/2012 0440   COAGS Lab Results  Component Value Date   INR 0.98 10/24/2012   Lipid Panel No results found for this basename: chol, trig, hdl, cholhdl, vldl, ldlcalc   HgbA1C  Lab Results  Component Value Date   HGBA1C 6.8* 10/25/2012   Cardiac Panel (last 3 results) No results found for this basename: CKTOTAL, CKMB, TROPONINI, RELINDX,  in the last 72 hours Urinalysis    Component Value Date/Time   COLORURINE YELLOW 10/25/2012 0047   APPEARANCEUR CLOUDY* 10/25/2012 0047   LABSPEC 1.019 10/25/2012 0047   PHURINE 5.0 10/25/2012 0047   GLUCOSEU >1000* 10/25/2012 0047   HGBUR TRACE* 10/25/2012 0047   BILIRUBINUR NEGATIVE 10/25/2012 0047   KETONESUR 15* 10/25/2012 0047   PROTEINUR NEGATIVE 10/25/2012 0047  UROBILINOGEN 0.2 10/25/2012 0047   NITRITE NEGATIVE 10/25/2012 0047   LEUKOCYTESUR NEGATIVE 10/25/2012 0047   Urine Drug Screen  No results found for this basename: labopia, cocainscrnur, labbenz, amphetmu, thcu, labbarb    Alcohol Level    Component Value Date/Time   [facility] 23* 10/24/2012 2327    SIGNIFICANT DIAGNOSTIC STUDIES  CT of the brain  10/26/2012 Postoperative changes with evacuation of most of the cerebellar hematoma. Small right cerebellar hematoma measuring 20 mm x 26 mm persists. Diffuse pneumocephalus. More prominent blood in the ventricles with stable ventricular size.  10/25/2012 Cerebellar hemorrhage in the vermis extending to the right is similar in size. There is new intraventricular hemorrhage and minimal progressive dilatation of ventricles.  10/24/2012 5 x  3.5 cm cerebellar hematoma with mild mass effect on the fourth ventricle but no hydrocephalus.   CXR 10/25/2012 Low volume chest with probable pulmonary vascular congestion.   EKG normal sinus rhythm  History of Present Illness  Bill Jackson is an 78 y.o. male history of hypertension and diabetes mellitus as well as alcohol abuse, presenting 10/24/2012 with acute onset of nausea and vomiting and complained of dizziness as well as ataxia. He was last known well at 2145 on 10/24/2012. There is no previous history of stroke nor TIA. He has not been on antiplatelet therapy no anticoagulation. CT scan of the head showed a 5 x 3.5 cm acute cerebellar hemorrhage with mild mass effect on fourth ventricle. Fourth ventricle is patent and patient had no signs of hydrocephalus. Patient was not a TPA candidate secondary to hemorrhage. He was admitted to the neuro ICU for further evaluation and treatment   Hospital Course   Patient with resultant right hand/arm severe dysmetria, scanning dysarthria, upgaze paralysis, lethargic. Given acute hemorrhage, patient at risk for hemorrhage, extension, cerebral edema, herniation, obstructive hydrocephalus, therefore Dr. [name] performed a Suboccipital Craniectomy for Evacuation of Cerebellar Hematoma with microdissection 10/25/2012. Patient with significant ataxia. Hemorrhage felt secondary to accelerated hypertension with BP reading at 201/94. On no antiplatelets prior to admission. Patient counseled for tobacco and alcohol cessation.   Patient has resultant ataxia. Physical therapy, occupational therapy and speech therapy evaluated patient. All agreed inpatient rehab is needed. Patient's family is/are supportive and can provide care at discharge. CIR bed is available today and patient will be transferred there.  Discharge Exam  Blood pressure 142/70, pulse 72, temperature 98.2 F (36.8 C), temperature source Oral, resp. rate 19, height 5\' 9"  (1.753 m), weight 92.1 kg (203  lb 0.7 oz), SpO2 99.00%.   Physical Exam  Elderly African American male not in distress. . Afebrile. Head is nontraumatic. Neck is supple without bruit. Hearing is normal. Cardiac exam no murmur or gallop. Lungs are clear to auscultation. Distal pulses are well felt.  Neurological Exam ; drowsy but opens eyes easily and follow simple commands. Pupils 3 mm equal reactive. Extraocular moments are full range but saccadic dysmetria on either direction when looking laterally. Fundi could not be visualized. He has significant dysarthria with started to speech. No facial weakness. Tongue is midline. Cough and gag appear weak. He can move all 4 extremities well against gravity but has significant right finger-to-nose dysmetria with almost a violent tremor when he approaches his nose. Mild proximal lower extremity weakness bilaterally. Deep tendon flexes are depressed. Plantars are downgoing. Sensation appears intact. Gait was not tested.   Discharge Diet  Dysphagia 3 nectar thick liquids  Discharge Plan  Disposition:  Transfer to [facility]  for ongoing PT, OT and ST  no anticoagulants for secondary stroke prevention secondary to hemorrhage.  Ongoing risk factor control by Primary Care Physician. Risk factor recommendations:  Hypertension target range 130-140/70-80 Lipid range - LDL < 100 and checked every 6 months, fasting Diabetes - HgB A1C <7 Smoking cessation Alcohol cessation     Follow-up Primary physician in 1 month.  Follow-up with Dr. Delia Heady in 2 months.  30 minutes were spent preparing discharge.  Signed Gwendolyn Lima. Manson Passey, [facility], MBA, MHA [facility] Pager: [phone] 10/28/2012 2:34 PM  I have personally obtained a history, examined the patient, evaluated imaging results, and formulated the assessment and plan of care. I agree with the above.  Delia Heady, MD

## 2012-10-28 NOTE — Progress Notes (Signed)
Speech Language Pathology Dysphagia Treatment Patient Details Name: Bill Jackson MRN: [id number] DOB: [birth year] Today's Date: 10/28/2012 Time: 5621-3086 SLP Time Calculation (min): 26 min  Assessment / Plan / Recommendation Clinical Impression  Pt. reports "Coughing a lot."  "Makes my head hurt."  Pt. coughed immediately x 3 trials with Nectar thick liquids.  There was no cough, but 1x positive throat clearing noted after honey thick liquid.  Recommend an objective swallow evaluation (MBS) to ensure best diet recommendation and reduce aspiration risk.    Diet Recommendation  Initiate / Change Diet: Dysphagia 3 (mechanical soft);Honey-thick liquid    SLP Plan Continue with current plan of care   Pertinent Vitals/Pain C/o HA when HOB elevated, and with coughing (unable to rate)   Swallowing Goals  SLP Swallowing Goals Patient will consume recommended diet without observed clinical signs of aspiration with: Moderate assistance  General Temperature Spikes Noted: No Respiratory Status: Room air Behavior/Cognition: Alert;Cooperative;Requires cueing Oral Cavity - Dentition: Missing dentition Patient Positioning: Upright in bed (C/O HA when HOB elevated)  Oral Cavity - Oral Hygiene Does patient have any of the following "at risk" factors?: Diet - patient on thickened liquids Brush patient's teeth BID with toothbrush (using toothpaste with fluoride): Yes Patient is HIGH RISK - Oral Care Protocol followed (see row info): Yes   Dysphagia Treatment Treatment focused on: Skilled observation of diet tolerance;Patient/family/caregiver education;Facilitation of pharyngeal phase Family/Caregiver Educated: No family present.  Pt. educated Treatment Methods/Modalities: Skilled observation;Differential diagnosis Patient observed directly with PO's: Yes Type of PO's observed: Nectar-thick liquids;Honey-thick liquids Feeding: Needs assist Liquids provided via: Cup;No straw Pharyngeal Phase Signs  & Symptoms: Suspected delayed swallow initiation;Multiple swallows;Audible swallow;Immediate cough Type of cueing: Verbal;Tactile Amount of cueing: Moderate   GO     Maryjo Rochester T 10/28/2012, 2:55 PM

## 2012-10-28 NOTE — PMR Pre-admission (Signed)
PMR Admission Coordinator Pre-Admission Assessment  Patient: Bill Jackson is an 78 y.o., male MRN: [id number] DOB: [birth year] Height: 5\' 9"  (175.3 cm) Weight: 92.1 kg (203 lb 0.7 oz)              Insurance Information HMO: yes    PPO:      PCP:      IPA:      80/20:      OTHER: medicare replacement policy PRIMARY: Blue medicare      Policy#: [id number]      Subscriber: pt CM Name: Melinda Crutch      Phone#: [phone]     Fax#: [phone] Pre-Cert#: [id number]      Employer: retired f/u CM will be Eusebio Friendly at [phone] update needed 11/04/12 Benefits:  Phone #: [phone]     Name: 3/27 Crystal Eff. Date: 08/03/2012 active     Deduct: none      Out of Pocket Max: $2500      Life Max: unlimited CIR: $100 per day days 1 thru 7, then 10% covered      SNF: no copay days 1 thru 10, Outpatient: $20 per visit     Co-Pay: no visit limit Home Health: 100%      Co-Pay: no visit limit DME: 80%      Providers: in network  SECONDARY: State BCBS PPO      Policy#: [id number]      Subscriber: pt No auth needed with medicare policy primary  Patient believes he has medicaid. As of 3/27 [phone] pt is not active with medicaid  Medicaid Application Date:       Case Manager:   Emergency Contact Information Contact Information   Name Relation Home Work Mobile   Neal,Timothy Friend [phone]     Aldridge,Ethel Sister [phone]     Gaylord Shih [phone]     Bernhardt, Riemenschneider [phone]       Current Medical History  Patient Admitting Diagnosis: midline to right cerebellar hemorrhage  History of Present Illness: Bill Jackson is a 78 y.o. male with history of hypertension, diabetes mellitus, who was admitted on 10/25/12 with acute onset of nausea and vomiting, dizziness as well as ataxia. CT scan of the head showed a 5 x 3.5 cm acute cerebellar hemorrhage with mild mass effect on fourth ventricle. Fourth ventricle is patent and patient had no signs of hydrocephalus. He  was evaluated by Dr. [name] and underwent suboccipital craniectomy for evacuation of hematoma that pm. Neurology felt that patient with hemorrhage due to accelerated HTN. Patient with resultant dysarthria, with weak cough, significant dysmetria RUE. With poor awareness, no visual tracking and impaired balance with sitting and standing attempts.  SLP with diet begun of Sysphagia 3 diet with nectar thick liquids. Headache secondary to stroke/hemm.  History of etoh use with need to follow for withdrawal.  Total: 3 NIH    Past Medical History  Past Medical History  Diagnosis Date  . Hypertension   . Diabetes mellitus     Family History  family history is not on file.  Prior Rehab/Hospitalizations: none   Current Medications  Current facility-administered medications:0.9 %  sodium chloride infusion, , Intravenous, Continuous, Noel Christmas, Last Rate: 75 mL/hr at 10/28/12 0700;  acetaminophen (TYLENOL) suppository 650 mg, 650 mg, Rectal, Q4H PRN, Noel Christmas, 650 mg at 10/26/12 1635;  acetaminophen (TYLENOL) tablet 650 mg, 650 mg, Oral, Q4H PRN, Noel Christmas, 650 mg at 10/27/12 2026 antiseptic oral rinse (BIOTENE) solution 15 mL, 15  mL, Mouth Rinse, q12n4p, Maeola Harman, MD, 15 mL at 10/28/12 1225;  benazepril (LOTENSIN) tablet 40 mg, 40 mg, Oral, Daily, Maeola Harman, MD, 40 mg at 10/28/12 0949;  chlorhexidine (PERIDEX) 0.12 % solution 15 mL, 15 mL, Mouth Rinse, BID, Maeola Harman, MD, 15 mL at 10/28/12 0750 dexmedetomidine (PRECEDEX) 200 MCG/50ML infusion, 0.4-1.2 mcg/kg/hr, Intravenous, Titrated, Kipp Brood, MD, Last Rate: 11 mL/hr at 10/25/12 1830, 0.478 mcg/kg/hr at 10/25/12 1830;  dexmedetomidine (PRECEDEX) 200 MCG/50ML infusion, 0.4-1.2 mcg/kg/hr, Intravenous, Titrated, Kipp Brood, MD, 0.4 mcg/kg/hr at 10/26/12 0700;  folic acid (FOLVITE) tablet 1 mg, 1 mg, Oral, Daily, Noel Christmas, 1 mg at 10/28/12 0949 glipiZIDE (GLUCOTROL) tablet 10 mg, 10 mg, Oral, QAC breakfast, Maeola Harman, MD, 10 mg at 10/28/12 0750;  insulin aspart (novoLOG) injection 0-15 Units, 0-15 Units, Subcutaneous, Q4H, Maeola Harman, MD, 2 Units at 10/28/12 1225;  labetalol (NORMODYNE,TRANDATE) injection 10-40 mg, 10-40 mg, Intravenous, Q10 min PRN, Noel Christmas, 20 mg at 10/25/12 0106 metFORMIN (GLUCOPHAGE) tablet 1,000 mg, 1,000 mg, Oral, Q breakfast, Micki Riley, MD, 1,000 mg at 10/28/12 0750;  morphine 2 MG/ML injection 2 mg, 2 mg, Intravenous, Q2H PRN, Layne Benton, NP, 2 mg at 10/27/12 2356;  multivitamin with minerals tablet 1 tablet, 1 tablet, Oral, Daily, Noel Christmas, 1 tablet at 10/28/12 0949;  ondansetron [facility]) injection 4 mg, 4 mg, Intravenous, Q6H PRN, Noel Christmas, 4 mg at 10/25/12 1610 pantoprazole (PROTONIX) EC tablet 40 mg, 40 mg, Oral, Daily, Micki Riley, MD, 40 mg at 10/28/12 0949;  RESOURCE THICKENUP CLEAR, , Oral, PRN, Micki Riley, MD;  senna-docusate (Senokot-S) tablet 1 tablet, 1 tablet, Oral, BID, Noel Christmas, 1 tablet at 10/28/12 9604;  thiamine (VITAMIN B-1) tablet 100 mg, 100 mg, Oral, Daily, Micki Riley, MD, 100 mg at 10/28/12 5409  Patients Current Diet: Dysphagia 3 diet with nectar thick liquids  Precautions / Restrictions Precautions Precautions: Fall Restrictions Weight Bearing Restrictions: No   Prior Activity Level Independent and driving   . Retired city Midwife. Has local sister, Cordelia Pen, who has been visiting. Two local sons in [place] that he is estranged from.Divorced. He is close to [place] and Mikle Bosworth who manage everything for him. He misses his dog , Bingo, who Mikle Bosworth is carrying for.Brother from [place] to visit next week.  Home Assistive Devices / Equipment Home Assistive Devices/Equipment: Eyeglasses;Shower chair with back  Prior Functional Level Prior Function Level of Independence: Independent Able to Take Stairs?: Yes Driving: Yes Comments:  (except at night per [name])  Current Functional Level Cognition   Arousal/Alertness: Awake/alert Overall Cognitive Status: Impaired Overall Cognitive Status: Impaired Current Attention Level: Focused Memory: Decreased recall of precautions Memory Deficits: no recall of location, reason for admission or surgery Orientation Level: Oriented to person;Oriented to place;Disoriented to time;Oriented to situation Safety/Judgement: Decreased awareness of safety precautions;Decreased safety judgement for tasks assessed;Impulsive;Decreased awareness of need for assistance Attention: Sustained Sustained Attention: Impaired Sustained Attention Impairment: Functional complex Memory: Impaired Memory Impairment: Decreased recall of new information Awareness: Impaired Awareness Impairment: Emergent impairment;Anticipatory impairment Problem Solving: Impaired Problem Solving Impairment: Functional basic Executive Function:  (executive function impaired) Decision Making: Impaired Decision Making Impairment: Functional basic Safety/Judgment: Impaired    Extremity Assessment (includes Sensation/Coordination)  RUE ROM/Strength/Tone: Deficits;Due to impaired cognition;Unable to fully assess RUE ROM/Strength/Tone Deficits: pt AROM and gross grasp. Pt able to make fist RUE Coordination: WFL - gross motor  RLE ROM/Strength/Tone: Unable to fully assess;Due to impaired cognition    ADLs  Grooming: Wash/dry  face;Min guard Where Assessed - Grooming: Supported sitting (friend with v/c x2 to complete task) Toilet Transfer: +2 Total assistance Toilet Transfer: Patient Percentage: 40% Toilet Transfer Method: Sit to stand Toilet Transfer Equipment: Raised toilet seat with arms (or 3-in-1 over toilet) Equipment Used: Gait belt;Rolling walker Transfers/Ambulation Related to ADLs: Pt with posterior lean and ataxic gait. see Pt notes ADL Comments: Pt supine on arrival and agreeable to OOB. pt needed repetition of cues to progress to EOB. Pt responds to friend cues and without  agtitation. Pt with visual deficits with perpherial vision. Pt using central vision    Mobility  Bed Mobility: Supine to Sit;Sitting - Scoot to [place] of Bed Rolling Right: 2: Max assist Right Sidelying to Sit: 1: +2 Total assist;HOB elevated Right Sidelying to Sit: Patient Percentage: 40% Supine to Sit: 3: Mod assist;HOB flat Sitting - Scoot to Edge of Bed: 3: Mod assist Sit to Supine: 1: +2 Total assist;HOB flat Sit to Supine: Patient Percentage: 20%    Transfers  Transfers: Sit to Stand;Stand to Sit Sit to Stand: 1: +2 Total assist;With upper extremity assist;From bed Sit to Stand: Patient Percentage: 50% Stand to Sit: 1: +2 Total assist;With upper extremity assist;To chair/3-in-1 Stand to Sit: Patient Percentage: 40%    Ambulation / Gait / Stairs / Psychologist, prison and probation services  Ambulation/Gait Ambulation/Gait Assistance: 1: +2 Total assist Ambulation/Gait: Patient Percentage: 50% Ambulation Distance (Feet): 10 Feet Assistive device: 2 person hand held assist Ambulation/Gait Assistance Details: pt with posterior lean and extended, rigid posture, ataxic gait.  ? visual deficits vs having proprioceptive deficits.   Gait Pattern: Step-through pattern;Decreased stride length;Ataxic;Narrow base of support Stairs: No Wheelchair Mobility Wheelchair Mobility: No    Posture / Games developer Sitting - Balance Support: Bilateral upper extremity supported;Feet supported Static Sitting - Level of Assistance: 3: Mod assist;2: Max assist Static Sitting - Comment/# of Minutes: Sat EOB ~10 mins with posterior lean.  Initially pt MinA to maintain sitting balance, but 2/2 restlessness pt required increased A to maintain balance.  While sitting EOB pt holding bedrail and yelling "Come on, come on!"   Static Standing Balance Static Standing - Balance Support: Bilateral upper extremity supported;During functional activity Static Standing - Level of Assistance: 1: +2 Total assist     Special needs/care consideration Bowel mgmt: incontinent Bladder mgmt: condom catheter Diabetic mgmt Hgb A1c is 6.7   Previous Home Environment Living Arrangements: Alone Lives With: Alone Available Help at Discharge: Other (Comment) ([name] is close friend) Type of Home: House Home Layout: One level Home Access: Stairs to enter Entrance Stairs-Rails: None Entrance Stairs-Number of Steps: 2 Bathroom Shower/Tub: Engineer, manufacturing systems: Standard Bathroom Accessibility: Yes How Accessible: Accessible via walker Home Care Services: No Additional Comments: [name] is his POA per pt and is arranging his dispo  Discharge Living Setting Plans for Discharge Living Setting: Lives with (comment) (to live with [name] and his wife and two kids) Type of Home at Discharge: House Discharge Home Layout: Two level;Able to live on main level with bedroom/bathroom Discharge Home Access: Stairs to enter Discharge Bathroom Shower/Tub: Tub/shower unit Discharge Bathroom Toilet: Standard Discharge Bathroom Accessibility: Yes How Accessible: Accessible via walker Do you have any problems obtaining your medications?: No  Social/Family/Support Systems Patient Roles: Parent (has two children ) Contact Information: [name], [place] and friend Anticipated Caregiver: [name], his wife and friends Anticipated Caregiver's Contact Information: see above Ability/Limitations of Caregiver: works day shift Caregiver Availability: 24/7 (to be arranged) Discharge Plan  Discussed with Primary Caregiver: Yes Is Caregiver In Agreement with Plan?: Yes Does Caregiver/Family have Issues with Lodging/Transportation while Pt is in Rehab?: No    Goals/Additional Needs Patient/Family Goal for Rehab: supervision to min PT, Min OT, supervision SLP Expected length of stay: ELOS 2 to 3 weeks Dietary Needs: Dysphagia 3 diet with nectar thick liquids Special Service Needs: [name] to provide POA  papers  Pt/Family Agrees to Admission and willing to participate: Yes Program Orientation Provided & Reviewed with Pt/Caregiver Including Roles  & Responsibilities: Yes   Decrease burden of Care through IP rehab admission: n/a   Possible need for SNF placement upon discharge: not anticipated. Discussed with [name] and he prefers CIR and states he can and will arrange 24/7 care in his own home for pt's care. Is aware that pt can no longer live alone in his home.   Patient Condition: This patient's condition remains as documented in the Consult dated 10/26/12, in which the Rehabilitation Physician determined and documented that the patient's condition is appropriate for intensive rehabilitative care in an inpatient rehabilitation facility.Functional changes are that pt is more alert and participating with therapy. Max assist overalls with visual and cognitive deficits.  See History of present Illness for medical update. Pt's medical and functional status update has been discussed with the Rehabilation Physician and pt is still appropriate for intensive rehabilatitive care in CIR. We will admit pt today.  Preadmission Screen Completed By:  Clois Dupes, 10/28/2012 12:58 PM ______________________________________________________________________   Discussed status with  Dr. Riley Kill on 10/28/12 at  1256 and received telephone approval for admission today.  Admission Coordinator:  Clois Dupes, time 1256 Date 10/28/12.

## 2012-10-28 NOTE — Progress Notes (Signed)
Patient received at 1525 from 3109 alert and oriented x 2-3 . Oriented patient to room and call bell system . Patient noted with dressing to posterior scalp and staples to left parietal area of scalp . IV ns infusing at 75cc / hour . Patient verbalized understanding of admission process . Continue with plan of care .            Bill Jackson

## 2012-10-28 NOTE — Evaluation (Signed)
SLP reviewed and agree with student findings.   Reinhold Rickey MA, CCC-SLP [phone]    

## 2012-10-28 NOTE — Progress Notes (Addendum)
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   Co-morbidities: htn, dm, etoh abuse  Functional Problem List  Patient demonstrates impairments in the following areas: Balance, Bladder, Bowel, Cognition, Endurance, Linguistic, Medication Management, Motor, Nutrition, Pain, Safety, Skin Integrity and Vision  Basic ADL's: grooming, bathing, dressing, toileting and toilet and shower transfers Advanced ADL's: simple meal preparation  Transfers:  bed mobility, bed to chair, toilet, tub/shower, car, furniture and floor Locomotion:  ambulation, wheelchair mobility and stairs  Additional Impairments:  Functional use of upper extremity and Discharge Disposition  Anticipated Outcomes Item Anticipated Outcome  Eating/Swallowing  Supervision to mod I  Basic self-care  Min assist  Tolieting  Min assist   Bowel/Bladder  Min assist  Transfers  Mod I  Locomotion  Supervision gait, mod I w/c  Communication  Supervision/cueing  Cognition  Supervision/cueing  Pain  Min assist   Safety/Judgment  Min assist   Other     Therapy Plan: PT Intensity: Minimum of 1-2 x/day ,45 to 90 minutes PT Frequency: 5 out of 7 days PT Duration Estimated Length of Stay: 2 weeks OT Intensity: Minimum of 1-2 x/day, 45 to 90 minutes OT Frequency: 5 out of 7 days OT Duration/Estimated Length of Stay: 2 weeks ST Intensity:  Minimum of 5-7 days. ST Frequency:  30-45 minutes ST Duration:  Two weeks. SLP Frequency: 5 out of 7 days SLP Duration/Estimated Length of Stay: Two weeks    Team Interventions: Item RN PT OT SLP SW TR Other  Self Care/Advanced ADL Retraining   X      Neuromuscular Re-Education  x X      Therapeutic Activities  x X   x   UE/LE Strength Training/ROM  x X      UE/LE Coordination Activities  x X      Visual/Perceptual Remediation/Compensation   X      DME/Adaptive Equipment  Instruction  x X      Therapeutic Exercise  x X      Balance/Vestibular Training  x X   x   Patient/Family Education x x X   x   Cognitive Remediation/Compensation    X     Functional Mobility Training  x X   x   Ambulation/Gait Training  x       Stair Training  x       Wheelchair Propulsion/Positioning  x X      Functional Electrical Stimulation  x       Community Reintegration  x    x   Dysphagia/Aspiration Film/video editor   X X     Bladder Management         Bowel Management         Disease Management/Prevention x        Pain Management x x X      Medication Management x        Skin Care/Wound Management x        Splinting/Orthotics  x X      Discharge Planning  x X   x   Psychosocial Support x  X X  x                          Team Discharge Planning: Destination: PT-Home ,OT-  home vs NHP , SLP-HomeHome Projected Follow-up:  PT-Outpatient PT;24 hour supervision/assistance, OT-HH with 24 hour supervision   , SLP-HHHome Health SLP Projected Equipment Needs: PT- , OT-none noted per caregiver on eval  , SLP-None recommended by [facility] Patient/family involved in discharge planning: PT- Patient,  OT- yes-Patient, SLP-PatientPatient  MD ELOS: 2 weeks Medical Rehab Prognosis:  Good Assessment: The patient has been admitted for CIR therapies. The team will be addressing, functional mobility, strength, stamina, balance, safety, adaptive techniques/equipment, self-care, bowel and bladder mgt, patient and caregiver education, NMR, Cognitive perceptual therapy, speech, swallowing, cognition, pain mgt. Goals have been set at min assist with self-care, supervision to min assist with communication and swallowing, and min assist with basic self-care.    Ranelle Oyster, MD, FAAPMR      See Team Conference Notes for weekly updates to the plan of care

## 2012-10-28 NOTE — Progress Notes (Signed)
Report given to Angie on Rehab Unit. Pt stable at discharge to rehab and improved since arrival to ICU. Vital signs stable. Pt declined any further assistance from nursing staff at this time.  Pt transported to 4010 without any complications.   Marcos Eke, RN 10/28/2012 3:39 PM

## 2012-10-28 NOTE — H&P (View-Only) (Signed)
Physical Medicine and Rehabilitation Admission H&P    Chief Complaint  Patient presents with  . Ataxia, dysmetria and visual deficits   HPI:  Bill Jackson is a 78 y.o. male with history of hypertension, diabetes mellitus, who was admitted on 10/25/12 with acute onset of nausea and vomiting, dizziness as well as ataxia. CT scan of the head showed a 5 x 3.5 cm acute cerebellar hemorrhage with mild mass effect on fourth ventricle. Fourth ventricle is patent and patient had no signs of hydrocephalus. He was evaluated by Dr. Stern and underwent emergent  suboccipital craniectomy for evacuation of hematoma that pm. Neurology felt that patient with hemorrhage due to accelerated HTN. Patient with resultant dysarthria, weak cough, dysphagia, significant dysmetria RUE and significant visual deficits. Diet was changed to  D3, nectar liquids as mentation improved. He has had intermittent cough since admission but no overt s/s of aspiration.   Follow up CCT with evacuation of most of cerebellar hematoma and diffuse pneumocephalus. Headaches continue but improving. Lethargy resolving.  Neurology feels that hemorrhage due to accelerated HTN-BP 201/94 at admission. Was not on antiplatelets PTA.    Review of Systems  HENT: Negative for hearing loss.   Eyes: Positive for blurred vision.  Respiratory: Positive for cough. Negative for shortness of breath.   Cardiovascular: Negative for chest pain and palpitations.  Gastrointestinal: Negative for heartburn, nausea and abdominal pain.  Genitourinary:       Nocturia X 2  Neurological: Positive for speech change, focal weakness and headaches.  Psychiatric/Behavioral: The patient is not nervous/anxious.    Past Medical History  Diagnosis Date  . Hypertension   . Diabetes mellitus    Past Surgical History  Procedure Laterality Date  . Heart stents  x3  . Cardiac surgery    . Hernia repair    . Rotator cuff repair    . Craniectomy N/A 10/25/2012     Procedure: Suboccipital Craniectomy for Evacuation of Cerebellar Hematoma;  Surgeon: Joseph Stern, MD;  Location: [facility];  Service: Neurosurgery;  Laterality: N/A;  Suboccipital Craniectomy for Evacuation of Cerebellar Hematoma   History reviewed. No pertinent family history.  Social History: Per power of attorney (Friend-Timothy) Single. Has sons who are not involved with him but POA talks with him daily/visits weekly. Retired bus driver. Goes to [facility] for medical care. He reports that he has been smoking. He does not have any smokeless tobacco history on file. Per reports that patient drinks 2 glasses of wine daily and shot of liquor occasionally. Occasionally binges per family.  Per reports that he does not use illicit drugs.   Allergies  Allergen Reactions  . Penicillins Other (See Comments)    unknown   Medications Prior to Admission  Medication Sig Dispense Refill  . benazepril (LOTENSIN) 40 MG tablet Take 40 mg by mouth daily.      . glipiZIDE (GLUCOTROL) 10 MG tablet Take 10 mg by mouth daily.      . insulin NPH-insulin regular (NOVOLIN 70/30) (70-30) 100 UNIT/ML injection Inject 15 Units into the skin as needed. Per sliding scale      . metFORMIN (GLUCOPHAGE) 1000 MG tablet Take 1,000 mg by mouth daily.         Home: Home Living Lives With: Alone Available Help at Discharge: Friend(s);Family Type of Home: House Additional Comments: pt with male visitor that states when questioned that he is not family   Functional History:    Functional Status:  Mobility: Bed Mobility Bed   Mobility: Supine to Sit;Sitting - Scoot to Edge of Bed Rolling Right: 2: Max assist Right Sidelying to Sit: 1: +2 Total assist;HOB elevated Right Sidelying to Sit: Patient Percentage: 40% Supine to Sit: 3: Mod assist;HOB flat Sitting - Scoot to Edge of Bed: 3: Mod assist Sit to Supine: 1: +2 Total assist;HOB flat Sit to Supine: Patient Percentage: 20% Transfers Transfers: Sit to Stand;Stand  to Sit Sit to Stand: 1: +2 Total assist;With upper extremity assist;From bed Sit to Stand: Patient Percentage: 50% Stand to Sit: 1: +2 Total assist;With upper extremity assist;To chair/3-in-1 Stand to Sit: Patient Percentage: 40% Ambulation/Gait Ambulation/Gait Assistance: 1: +2 Total assist Ambulation/Gait: Patient Percentage: 50% Ambulation Distance (Feet): 10 Feet Assistive device: 2 person hand held assist Ambulation/Gait Assistance Details: pt with posterior lean and extended, rigid posture, ataxic gait.  ? visual deficits vs having proprioceptive deficits.   Gait Pattern: Step-through pattern;Decreased stride length;Ataxic;Narrow base of support Stairs: No Wheelchair Mobility Wheelchair Mobility: No  ADL: ADL Grooming: Wash/dry face;Min guard Where Assessed - Grooming: Supported sitting (friend with v/c x2 to complete task) Toilet Transfer: +2 Total assistance Toilet Transfer Method: Sit to stand Toilet Transfer Equipment: Raised toilet seat with arms (or 3-in-1 over toilet) Equipment Used: Gait belt;Rolling walker Transfers/Ambulation Related to ADLs: Pt with posterior lean and ataxic gait. see Pt notes ADL Comments: Pt supine on arrival and agreeable to OOB. pt needed repetition of cues to progress to EOB. Pt responds to friend cues and without agtitation. Pt with visual deficits with perpherial vision. Pt using central vision  Cognition: Cognition Overall Cognitive Status: Impaired Arousal/Alertness: Awake/alert Orientation Level: Oriented to person;Oriented to place;Disoriented to time;Oriented to situation Attention: Sustained Sustained Attention: Impaired Sustained Attention Impairment: Functional complex Memory: Impaired Memory Impairment: Decreased recall of new information Awareness: Impaired Awareness Impairment: Emergent impairment;Anticipatory impairment Problem Solving: Impaired Problem Solving Impairment: Functional basic Executive Function:  (executive  function impaired) Decision Making: Impaired Decision Making Impairment: Functional basic Safety/Judgment: Impaired Cognition Overall Cognitive Status: Impaired Area of Impairment: Attention;Memory;Safety/judgement;Awareness of deficits Arousal/Alertness: Awake/alert Orientation Level: Disoriented to;Place;Time;Situation Behavior During Session: Restless Current Attention Level: Focused Memory: Decreased recall of precautions Memory Deficits: no recall of location, reason for admission or surgery Safety/Judgement: Decreased awareness of safety precautions;Decreased safety judgement for tasks assessed;Impulsive;Decreased awareness of need for assistance  Physical Exam: Blood pressure 125/76, pulse 75, temperature 98.1 F (36.7 C), temperature source Oral, resp. rate 15, height 5' 9" (1.753 m), weight 92.1 kg (203 lb 0.7 oz), SpO2 98.00%. Physical Exam  Nursing note and vitals reviewed. Constitutional: He is oriented to person, place, and time. He appears well-developed and well-nourished.  Tends to keep eyes closed.  HENT:  Head: Normocephalic and atraumatic.  Right Ear: External ear normal.  Left Ear: External ear normal.  Eyes: Conjunctivae and EOM are normal. Pupils are equal, round, and reactive to light. Right eye exhibits no discharge. Left eye exhibits no discharge. No scleral icterus.  Dysconjugate gaze. Arcus senilis. Pinpoint pupils.   Neck: Neck supple. No JVD present. No thyromegaly present.  Limited due to posterior neck incision.   Cardiovascular: Normal rate and regular rhythm.  Exam reveals no gallop and no friction rub.   No murmur heard. Pulmonary/Chest: Effort normal and breath sounds normal. No respiratory distress. He has no wheezes. He has no rales. He exhibits no tenderness.  Abdominal: Soft. Bowel sounds are normal. He exhibits no distension. There is no tenderness. There is no rebound.  Lymphadenopathy:    He has no cervical adenopathy.    Neurological: He is  alert and oriented to person, place, and time.  Moderate to severe dysarthria with occasional stuttering. Is able to handle his food without cough or distress. Occasional perseverative speech. Needed cues initially to to follow two step commands. Visual deficits in L>R peripheral fields, dysconjugate gaze.  Mild right sided weakness with ataxia RUE and decrease in motor control RLE with ataxia as well. A bit impulsive. Strength grossly 3+ to 4/5 in all 4 limbs.  Skin: Skin is warm and dry.    Results for orders placed during the hospital encounter of 10/24/12   CBC     Status: None   Collection Time    10/27/12  4:40 AM      Result Value Range   WBC 10.3  4.0 - 10.5 K/uL   RBC 4.36  4.22 - 5.81 MIL/uL   Hemoglobin 14.3  13.0 - 17.0 g/dL   HCT 41.5  39.0 - 52.0 %   MCV 95.2  78.0 - 100.0 fL   MCH 32.8  26.0 - 34.0 pg   MCHC 34.5  30.0 - 36.0 g/dL   RDW 14.3  11.5 - 15.5 %   Platelets 196  150 - 400 K/uL  BASIC METABOLIC PANEL     Status: Abnormal   Collection Time    10/27/12  4:40 AM      Result Value Range   Sodium 142  135 - 145 mEq/L   Potassium 3.4 (*) 3.5 - 5.1 mEq/L   Chloride 106  96 - 112 mEq/L   CO2 23  19 - 32 mEq/L   Glucose, Bld 115 (*) 70 - 99 mg/dL   BUN 10  6 - 23 mg/dL   Creatinine, Ser 1.04  0.50 - 1.35 mg/dL   Calcium 8.3 (*) 8.4 - 10.5 mg/dL   GFR calc non Af Amer 68 (*) >90 mL/min   GFR calc Af Amer 78 (*) >90 mL/min   Comment:            The eGFR has been calculated     using the CKD EPI equation.     This calculation has not been     validated in all clinical     situations.     eGFR's persistently     <90 mL/min signify     possible Chronic Kidney Disease.  GLUCOSE, CAPILLARY     Status: Abnormal   Collection Time    10/27/12  8:03 AM      Result Value Range   Glucose-Capillary 126 (*) 70 - 99 mg/dL  GLUCOSE, CAPILLARY     Status: Abnormal   Collection Time    10/27/12 11:59 AM      Result Value Range   Glucose-Capillary 135 (*) 70 - 99 mg/dL   GLUCOSE, CAPILLARY     Status: Abnormal   Collection Time    10/27/12  3:48 PM      Result Value Range   Glucose-Capillary 156 (*) 70 - 99 mg/dL  GLUCOSE, CAPILLARY     Status: Abnormal   Collection Time    10/27/12  7:48 PM      Result Value Range   Glucose-Capillary 128 (*) 70 - 99 mg/dL  GLUCOSE, CAPILLARY     Status: Abnormal   Collection Time    10/27/12 11:48 PM      Result Value Range   Glucose-Capillary 202 (*) 70 - 99 mg/dL   Comment 1 Notify RN     Comment 2   Documented in Chart    GLUCOSE, CAPILLARY     Status: Abnormal   Collection Time    10/28/12  3:48 AM      Result Value Range   Glucose-Capillary 143 (*) 70 - 99 mg/dL   Comment 1 Notify RN     Comment 2 Documented in Chart    GLUCOSE, CAPILLARY     Status: Abnormal   Collection Time    10/28/12  8:08 AM      Result Value Range   Glucose-Capillary 166 (*) 70 - 99 mg/dL   No results found.  Post Admission Physician Evaluation: 1. Functional deficits secondary  to midline cerebellar hemorrhage slightly paracentral to the right s/p suboccipital crani. 2. Patient is admitted to receive collaborative, interdisciplinary care between the physiatrist, rehab nursing staff, and therapy team. 3. Patient's level of medical complexity and substantial therapy needs in context of that medical necessity cannot be provided at a lesser intensity of care such as a SNF. 4. Patient has experienced substantial functional loss from his/her baseline which was documented above under the "Functional History" and "Functional Status" headings.  Judging by the patient's diagnosis, physical exam, and functional history, the patient has potential for functional progress which will result in measurable gains while on inpatient rehab.  These gains will be of substantial and practical use upon discharge  in facilitating mobility and self-care at the household level. 5. Physiatrist will provide 24 hour management of medical needs as well as  oversight of the therapy plan/treatment and provide guidance as appropriate regarding the interaction of the two. 6. 24 hour rehab nursing will assist with bladder management, bowel management, safety, skin/wound care, disease management, medication administration, pain management and patient education  and help integrate therapy concepts, techniques,education, etc. 7. PT will assess and treat for/with: Lower extremity strength, range of motion, stamina, balance, functional mobility, safety, adaptive techniques and equipment, NMR, coordination, vestibular deficits, education.   Goals are: minimal assist 8. OT will assess and treat for/with: ADL's, functional mobility, safety, upper extremity strength, adaptive techniques and equipment, NMR, coordination, vestibular deficits.   Goals are: min assist. 9. SLP will assess and treat for/with: speech intelligibility, communication, swallowing.  Goals are: supervision. 10. Case Management and Social Worker will assess and treat for psychological issues and discharge planning. 11. Team conference will be held weekly to assess progress toward goals and to determine barriers to discharge. 12. Patient will receive at least 3 hours of therapy per day at least 5 days per week. 13. ELOS: 2-3 weeks      Prognosis:  good   Medical Problem List and Plan: 1. DVT Prophylaxis/Anticoagulation: Mechanical: Sequential compression devices, below knee Bilateral lower extremities 2. Headaches: improving. Will continue with prn mediations. 3. Mood: Seems to be in good spirits. Will have LCSW follow for formal evaluation.  -may need some motivation at times however.  4. Neuropsych: This patient is capable of making decisions on his/her own behalf. 5. HTN: Will monitor with BID checks. Starting to trend upwards with increase in activity. May need additional agent. Continue Lotensin. Monitor renal status as on  Nectar liquids.  6. DM type 2: Was on 70/30 insulin additionally at  home-Hgb A1C @ 6.7. Monitor BS with AC/HS cbg checks.  Continue metformin and glipizide. Will monitor trends to see if insulin needs to be resumed.  7. H/o Alcohol use:  No withdrawal on CIWA protocol. Continue thiamine.  8. Hypokalemia: Likely dilutional due to ongoing IVF. Will change fluids   to bedtime.  9. Dysphagia: Needs supervision for meals. IVF at HS to help with hydration.   Chevon Fomby T. Hakan Nudelman, MD, FAAPMR   10/28/2012 

## 2012-10-28 NOTE — Progress Notes (Signed)
Subjective: Patient reports "My head just hurts"  Objective: Vital signs in last 24 hours: Temp:  [98.1 F (36.7 C)-100 F (37.8 C)] 98.1 F (36.7 C) (03/28 0349) Pulse Rate:  [59-92] 75 (03/28 0800) Resp:  [10-25] 15 (03/28 0800) BP: (111-156)/(49-87) 125/76 mmHg (03/28 0800) SpO2:  [94 %-98 %] 98 % (03/28 0800)  Intake/Output from previous day: 03/27 0701 - 03/28 0700 In: 2280 [P.O.:480; I.V.:1800] Out: 840 [Urine:840] Intake/Output this shift: Total I/O In: 75 [I.V.:75] Out: -   Awake, alert, conversant. Headache persists. Good strength all extremities.  Dysarthria and dysmetria persist, improved from pre-op. Drsg intact, clean, dry. Pt verbalizes understanding of plan for inpatient rehab if approved by insurance.   Lab Results:  Recent Labs  10/27/12 0440  WBC 10.3  HGB 14.3  HCT 41.5  PLT 196   BMET  Recent Labs  10/27/12 0440  NA 142  K 3.4*  CL 106  CO2 23  GLUCOSE 115*  BUN 10  CREATININE 1.04  CALCIUM 8.3*    Studies/Results: No results found.  Assessment/Plan: Improving   LOS: 4 days  CIR pending insurance approval.   Georgiann Cocker 10/28/2012, 8:58 AM

## 2012-10-28 NOTE — H&P (Signed)
Physical Medicine and Rehabilitation Admission H&P    Chief Complaint  Patient presents with  . Ataxia, dysmetria and visual deficits   HPI:  Bill Jackson is a 78 y.o. male with history of hypertension, diabetes mellitus, who was admitted on 10/25/12 with acute onset of nausea and vomiting, dizziness as well as ataxia. CT scan of the head showed a 5 x 3.5 cm acute cerebellar hemorrhage with mild mass effect on fourth ventricle. Fourth ventricle is patent and patient had no signs of hydrocephalus. He was evaluated by Dr. [name] and underwent emergent  suboccipital craniectomy for evacuation of hematoma that pm. Neurology felt that patient with hemorrhage due to accelerated HTN. Patient with resultant dysarthria, weak cough, dysphagia, significant dysmetria RUE and significant visual deficits. Diet was changed to  D3, nectar liquids as mentation improved. He has had intermittent cough since admission but no overt s/s of aspiration.   Follow up CCT with evacuation of most of cerebellar hematoma and diffuse pneumocephalus. Headaches continue but improving. Lethargy resolving.  Neurology feels that hemorrhage due to accelerated HTN-BP 201/94 at admission. Was not on antiplatelets PTA.    Review of Systems  HENT: Negative for hearing loss.   Eyes: Positive for blurred vision.  Respiratory: Positive for cough. Negative for shortness of breath.   Cardiovascular: Negative for chest pain and palpitations.  Gastrointestinal: Negative for heartburn, nausea and abdominal pain.  Genitourinary:       Nocturia X 2  Neurological: Positive for speech change, focal weakness and headaches.  Psychiatric/Behavioral: The patient is not nervous/anxious.    Past Medical History  Diagnosis Date  . Hypertension   . Diabetes mellitus    Past Surgical History  Procedure Laterality Date  . Heart stents  x3  . Cardiac surgery    . Hernia repair    . Rotator cuff repair    . Craniectomy N/A 10/25/2012     Procedure: Suboccipital Craniectomy for Evacuation of Cerebellar Hematoma;  Surgeon: Maeola Harman, MD;  Location: [facility];  Service: Neurosurgery;  Laterality: N/A;  Suboccipital Craniectomy for Evacuation of Cerebellar Hematoma   History reviewed. No pertinent family history.  Social History: Per power of attorney (Friend-Timothy) Single. Has sons who are not involved with him but POA talks with him daily/visits weekly. Retired Midwife. Goes to [facility] for medical care. He reports that he has been smoking. He does not have any smokeless tobacco history on file. Per reports that patient drinks 2 glasses of wine daily and shot of liquor occasionally. Occasionally binges per family.  Per reports that he does not use illicit drugs.   Allergies  Allergen Reactions  . Penicillins Other (See Comments)    unknown   Medications Prior to Admission  Medication Sig Dispense Refill  . benazepril (LOTENSIN) 40 MG tablet Take 40 mg by mouth daily.      Marland Kitchen glipiZIDE (GLUCOTROL) 10 MG tablet Take 10 mg by mouth daily.      . insulin NPH-insulin regular (NOVOLIN 70/30) (70-30) 100 UNIT/ML injection Inject 15 Units into the skin as needed. Per sliding scale      . metFORMIN (GLUCOPHAGE) 1000 MG tablet Take 1,000 mg by mouth daily.         Home: Home Living Lives With: Alone Available Help at Discharge: Friend(s);Family Type of Home: House Additional Comments: pt with male visitor that states when questioned that he is not family   Functional History:    Functional Status:  Mobility: Bed Mobility Bed  Mobility: Supine to Sit;Sitting - Scoot to [place] of Bed Rolling Right: 2: Max assist Right Sidelying to Sit: 1: +2 Total assist;HOB elevated Right Sidelying to Sit: Patient Percentage: 40% Supine to Sit: 3: Mod assist;HOB flat Sitting - Scoot to Edge of Bed: 3: Mod assist Sit to Supine: 1: +2 Total assist;HOB flat Sit to Supine: Patient Percentage: 20% Transfers Transfers: Sit to Stand;Stand  to Sit Sit to Stand: 1: +2 Total assist;With upper extremity assist;From bed Sit to Stand: Patient Percentage: 50% Stand to Sit: 1: +2 Total assist;With upper extremity assist;To chair/3-in-1 Stand to Sit: Patient Percentage: 40% Ambulation/Gait Ambulation/Gait Assistance: 1: +2 Total assist Ambulation/Gait: Patient Percentage: 50% Ambulation Distance (Feet): 10 Feet Assistive device: 2 person hand held assist Ambulation/Gait Assistance Details: pt with posterior lean and extended, rigid posture, ataxic gait.  ? visual deficits vs having proprioceptive deficits.   Gait Pattern: Step-through pattern;Decreased stride length;Ataxic;Narrow base of support Stairs: No Wheelchair Mobility Wheelchair Mobility: No  ADL: ADL Grooming: Wash/dry face;Min guard Where Assessed - Grooming: Supported sitting (friend with v/c x2 to complete task) Toilet Transfer: +2 Total assistance Toilet Transfer Method: Sit to Barista: Raised toilet seat with arms (or 3-in-1 over toilet) Equipment Used: Gait belt;Rolling walker Transfers/Ambulation Related to ADLs: Pt with posterior lean and ataxic gait. see Pt notes ADL Comments: Pt supine on arrival and agreeable to OOB. pt needed repetition of cues to progress to EOB. Pt responds to friend cues and without agtitation. Pt with visual deficits with perpherial vision. Pt using central vision  Cognition: Cognition Overall Cognitive Status: Impaired Arousal/Alertness: Awake/alert Orientation Level: Oriented to person;Oriented to place;Disoriented to time;Oriented to situation Attention: Sustained Sustained Attention: Impaired Sustained Attention Impairment: Functional complex Memory: Impaired Memory Impairment: Decreased recall of new information Awareness: Impaired Awareness Impairment: Emergent impairment;Anticipatory impairment Problem Solving: Impaired Problem Solving Impairment: Functional basic Executive Function:  (executive  function impaired) Decision Making: Impaired Decision Making Impairment: Functional basic Safety/Judgment: Impaired Cognition Overall Cognitive Status: Impaired Area of Impairment: Attention;Memory;Safety/judgement;Awareness of deficits Arousal/Alertness: Awake/alert Orientation Level: Disoriented to;Place;Time;Situation Behavior During Session: Restless Current Attention Level: Focused Memory: Decreased recall of precautions Memory Deficits: no recall of location, reason for admission or surgery Safety/Judgement: Decreased awareness of safety precautions;Decreased safety judgement for tasks assessed;Impulsive;Decreased awareness of need for assistance  Physical Exam: Blood pressure 125/76, pulse 75, temperature 98.1 F (36.7 C), temperature source Oral, resp. rate 15, height 5\' 9"  (1.753 m), weight 92.1 kg (203 lb 0.7 oz), SpO2 98.00%. Physical Exam  Nursing note and vitals reviewed. Constitutional: He is oriented to person, place, and time. He appears well-developed and well-nourished.  Tends to keep eyes closed.  HENT:  Head: Normocephalic and atraumatic.  Right Ear: External ear normal.  Left Ear: External ear normal.  Eyes: Conjunctivae and EOM are normal. Pupils are equal, round, and reactive to light. Right eye exhibits no discharge. Left eye exhibits no discharge. No scleral icterus.  Dysconjugate gaze. Arcus senilis. Pinpoint pupils.   Neck: Neck supple. No JVD present. No thyromegaly present.  Limited due to posterior neck incision.   Cardiovascular: Normal rate and regular rhythm.  Exam reveals no gallop and no friction rub.   No murmur heard. Pulmonary/Chest: Effort normal and breath sounds normal. No respiratory distress. He has no wheezes. He has no rales. He exhibits no tenderness.  Abdominal: Soft. Bowel sounds are normal. He exhibits no distension. There is no tenderness. There is no rebound.  Lymphadenopathy:    He has no cervical adenopathy.  Neurological: He is  alert and oriented to person, place, and time.  Moderate to severe dysarthria with occasional stuttering. Is able to handle his food without cough or distress. Occasional perseverative speech. Needed cues initially to to follow two step commands. Visual deficits in L>R peripheral fields, dysconjugate gaze.  Mild right sided weakness with ataxia RUE and decrease in motor control RLE with ataxia as well. A bit impulsive. Strength grossly 3+ to 4/5 in all 4 limbs.  Skin: Skin is warm and dry.    Results for orders placed during the hospital encounter of 10/24/12   CBC     Status: None   Collection Time    10/27/12  4:40 AM      Result Value Range   WBC 10.3  4.0 - 10.5 K/uL   RBC 4.36  4.22 - 5.81 MIL/uL   Hemoglobin 14.3  13.0 - 17.0 g/dL   HCT 13.0  86.5 - 78.4 %   MCV 95.2  78.0 - 100.0 fL   MCH 32.8  26.0 - 34.0 pg   MCHC 34.5  30.0 - 36.0 g/dL   RDW 69.6  29.5 - 28.4 %   Platelets 196  150 - 400 K/uL  BASIC METABOLIC PANEL     Status: Abnormal   Collection Time    10/27/12  4:40 AM      Result Value Range   Sodium 142  135 - 145 mEq/L   Potassium 3.4 (*) 3.5 - 5.1 mEq/L   Chloride 106  96 - 112 mEq/L   CO2 23  19 - 32 mEq/L   Glucose, Bld 115 (*) 70 - 99 mg/dL   BUN 10  6 - 23 mg/dL   Creatinine, Ser 1.32  0.50 - 1.35 mg/dL   Calcium 8.3 (*) 8.4 - 10.5 mg/dL   GFR calc non Af Amer 68 (*) >90 mL/min   GFR calc Af Amer 78 (*) >90 mL/min   Comment:            The eGFR has been calculated     using the CKD EPI equation.     This calculation has not been     validated in all clinical     situations.     eGFR's persistently     <90 mL/min signify     possible Chronic Kidney Disease.  GLUCOSE, CAPILLARY     Status: Abnormal   Collection Time    10/27/12  8:03 AM      Result Value Range   Glucose-Capillary 126 (*) 70 - 99 mg/dL  GLUCOSE, CAPILLARY     Status: Abnormal   Collection Time    10/27/12 11:59 AM      Result Value Range   Glucose-Capillary 135 (*) 70 - 99 mg/dL   GLUCOSE, CAPILLARY     Status: Abnormal   Collection Time    10/27/12  3:48 PM      Result Value Range   Glucose-Capillary 156 (*) 70 - 99 mg/dL  GLUCOSE, CAPILLARY     Status: Abnormal   Collection Time    10/27/12  7:48 PM      Result Value Range   Glucose-Capillary 128 (*) 70 - 99 mg/dL  GLUCOSE, CAPILLARY     Status: Abnormal   Collection Time    10/27/12 11:48 PM      Result Value Range   Glucose-Capillary 202 (*) 70 - 99 mg/dL   Comment 1 Notify RN     Comment 2  Documented in Chart    GLUCOSE, CAPILLARY     Status: Abnormal   Collection Time    10/28/12  3:48 AM      Result Value Range   Glucose-Capillary 143 (*) 70 - 99 mg/dL   Comment 1 Notify RN     Comment 2 Documented in Chart    GLUCOSE, CAPILLARY     Status: Abnormal   Collection Time    10/28/12  8:08 AM      Result Value Range   Glucose-Capillary 166 (*) 70 - 99 mg/dL   No results found.  Post Admission Physician Evaluation: 1. Functional deficits secondary  to midline cerebellar hemorrhage slightly paracentral to the right s/p suboccipital crani. 2. Patient is admitted to receive collaborative, interdisciplinary care between the physiatrist, rehab nursing staff, and therapy team. 3. Patient's level of medical complexity and substantial therapy needs in context of that medical necessity cannot be provided at a lesser intensity of care such as a SNF. 4. Patient has experienced substantial functional loss from his/her baseline which was documented above under the "Functional History" and "Functional Status" headings.  Judging by the patient's diagnosis, physical exam, and functional history, the patient has potential for functional progress which will result in measurable gains while on inpatient rehab.  These gains will be of substantial and practical use upon discharge  in facilitating mobility and self-care at the household level. 5. Physiatrist will provide 24 hour management of medical needs as well as  oversight of the therapy plan/treatment and provide guidance as appropriate regarding the interaction of the two. 6. 24 hour rehab nursing will assist with bladder management, bowel management, safety, skin/wound care, disease management, medication administration, pain management and patient education  and help integrate therapy concepts, techniques,education, etc. 7. PT will assess and treat for/with: Lower extremity strength, range of motion, stamina, balance, functional mobility, safety, adaptive techniques and equipment, NMR, coordination, vestibular deficits, education.   Goals are: minimal assist 8. OT will assess and treat for/with: ADL's, functional mobility, safety, upper extremity strength, adaptive techniques and equipment, NMR, coordination, vestibular deficits.   Goals are: min assist. 9. SLP will assess and treat for/with: speech intelligibility, communication, swallowing.  Goals are: supervision. 10. Case Management and Social Worker will assess and treat for psychological issues and discharge planning. 11. Team conference will be held weekly to assess progress toward goals and to determine barriers to discharge. 12. Patient will receive at least 3 hours of therapy per day at least 5 days per week. 13. ELOS: 2-3 weeks      Prognosis:  good   Medical Problem List and Plan: 1. DVT Prophylaxis/Anticoagulation: Mechanical: Sequential compression devices, below knee Bilateral lower extremities 2. Headaches: improving. Will continue with prn mediations. 3. Mood: Seems to be in good spirits. Will have LCSW follow for formal evaluation.  -may need some motivation at times however.  4. Neuropsych: This patient is capable of making decisions on his/her own behalf. 5. HTN: Will monitor with BID checks. Starting to trend upwards with increase in activity. May need additional agent. Continue Lotensin. Monitor renal status as on  Nectar liquids.  6. DM type 2: Was on 70/30 insulin additionally at  home-Hgb A1C @ 6.7. Monitor BS with AC/HS cbg checks.  Continue metformin and glipizide. Will monitor trends to see if insulin needs to be resumed.  7. H/o Alcohol use:  No withdrawal on CIWA protocol. Continue thiamine.  8. Hypokalemia: Likely dilutional due to ongoing IVF. Will change fluids  to bedtime.  9. Dysphagia: Needs supervision for meals. IVF at [facility] to help with hydration.   Ranelle Oyster, MD, [place] [facility]   10/28/2012

## 2012-10-28 NOTE — Progress Notes (Signed)
Stroke Team Progress Note  HISTORY Bill Jackson is an 78 y.o. male history of hypertension and diabetes mellitus as well as alcohol abuse, presenting 10/24/2012 with acute onset of nausea and vomiting and complained of dizziness as well as ataxia. He was last known well at 2145 on 10/24/2012. There is no previous history of stroke nor TIA. He has not been on antiplatelet therapy no anticoagulation. CT scan of the head showed a 5 x 3.5 cm acute cerebellar hemorrhage with mild mass effect on fourth ventricle. Fourth ventricle is patent and patient had no signs of hydrocephalus. Patient was not a TPA candidate secondary to hemorrhage. He was admitted to the neuro ICU for further evaluation and treatment.  SUBJECTIVE Sitting up in bed, eating breakfast. No family at bedside.stable will go to rehab today likely.  OBJECTIVE Most recent Vital Signs: Filed Vitals:   10/28/12 0400 10/28/12 0500 10/28/12 0600 10/28/12 0700  BP: 111/76 133/68 124/59 139/69  Pulse: 81 72 59 60  Temp:      TempSrc:      Resp: 17 13 15 11   Height:      Weight:      SpO2: 96% 97%  95%   CBG (last 3)   Recent Labs  10/27/12 2348 10/28/12 0348 10/28/12 0808  GLUCAP 202* 143* 166*    IV Fluid Intake:   . sodium chloride 75 mL/hr at 10/28/12 0700  . dexmedetomidine 0.478 mcg/kg/hr (10/25/12 1830)  . dexmedetomidine Stopped (10/26/12 0800)    MEDICATIONS  . antiseptic oral rinse  15 mL Mouth Rinse q12n4p  . benazepril  40 mg Oral Daily  . chlorhexidine  15 mL Mouth Rinse BID  . folic acid  1 mg Oral Daily  . glipiZIDE  10 mg Oral QAC breakfast  . insulin aspart  0-15 Units Subcutaneous Q4H  . metFORMIN  1,000 mg Oral Q breakfast  . multivitamin with minerals  1 tablet Oral Daily  . pantoprazole  40 mg Oral Daily  . senna-docusate  1 tablet Oral BID  . thiamine  100 mg Oral Daily   PRN:  acetaminophen, acetaminophen, labetalol, morphine injection, ondansetron (ZOFRAN) IV, RESOURCE THICKENUP CLEAR  Diet:   Dysphagia 3 nectar thick liquids Activity:  OOB DVT Prophylaxis:  SCDs   CLINICALLY SIGNIFICANT STUDIES Basic Metabolic Panel:   Recent Labs Lab 10/24/12 2340 10/27/12 0440  NA 139 142  K 3.3* 3.4*  CL 105 106  CO2  --  23  GLUCOSE 215* 115*  BUN 15 10  CREATININE 1.10 1.04  CALCIUM  --  8.3*   Liver Function Tests: No results found for this basename: AST, ALT, ALKPHOS, BILITOT, PROT, ALBUMIN,  in the last 168 hours CBC:   Recent Labs Lab 10/24/12 2327 10/24/12 2340 10/27/12 0440  WBC 7.4  --  10.3  HGB 15.8 16.3 14.3  HCT 43.6 48.0 41.5  MCV 92.4  --  95.2  PLT 246  --  196   Coagulation:   Recent Labs Lab 10/24/12 2327  LABPROT 12.9  INR 0.98   Cardiac Enzymes:   Recent Labs Lab 10/25/12 0345 10/25/12 0700  TROPONINI <0.30 <0.30   Urinalysis:   Recent Labs Lab 10/25/12 0047  COLORURINE YELLOW  LABSPEC 1.019  PHURINE 5.0  GLUCOSEU >1000*  HGBUR TRACE*  BILIRUBINUR NEGATIVE  KETONESUR 15*  PROTEINUR NEGATIVE  UROBILINOGEN 0.2  NITRITE NEGATIVE  LEUKOCYTESUR NEGATIVE   Lipid Panel No results found for this basename: chol,  trig,  hdl,  cholhdl,  vldl,  ldlcalc   HgbA1C  Lab Results  Component Value Date   HGBA1C 6.8* 10/25/2012   Urine Drug Screen:   No results found for this basename: labopia,  cocainscrnur,  labbenz,  amphetmu,  thcu,  labbarb    Alcohol Level:   Recent Labs Lab 10/24/12 2327  ETH 23*    CT of the brain  10/26/2012 Postoperative changes with evacuation of most of the cerebellar hematoma. Small right cerebellar hematoma measuring 20 mm x 26 mm persists. Diffuse pneumocephalus. More prominent blood in the ventricles with stable ventricular size. 10/25/2012 Cerebellar hemorrhage in the vermis extending to the right is similar in size. There is new intraventricular hemorrhage and minimal progressive dilatation of ventricles.  10/24/2012  5 x 3.5 cm cerebellar hematoma with mild mass effect on the fourth ventricle but  no hydrocephalus.    MRI of the brain    MRA of the brain    CXR  10/25/2012  Low volume chest with probable pulmonary vascular congestion.   EKG  normal sinus rhythm  Therapy Recommendations CIR  Physical Exam   Elderly African American male not in distress. . Afebrile. Head is nontraumatic. Neck is supple without bruit. Hearing is normal. Cardiac exam no murmur or gallop. Lungs are clear to auscultation. Distal pulses are well felt. Neurological Exam ; drowsy but opens eyes easily and follow simple commands. Pupils 3 mm equal reactive. Extraocular moments are full range but saccadic dysmetria   on either direction when looking laterally. Fundi could not be visualized. He has significant dysarthria with started to speech. No facial weakness. Tongue is midline. Cough and gag appear weak. He can move all 4 extremities well against gravity but has significant right finger-to-nose dysmetria with almost a violent tremor when he approaches his nose. Mild proximal lower extremity weakness bilaterally. Deep tendon flexes are depressed. Plantars are downgoing. Sensation appears intact. Gait was not tested.  ASSESSMENT Mr. Bill Jackson is a 78 y.o. male presenting with nausea, vomiting, dizziness and ataxia. Imaging confirms a midline large cerebellar hemorrhage with mass effect on the fourth ventricle. Hemorrhage felt to be secondary to accelerated Hypertension; highest BP 201/94 On no antiplatelets prior to admission. Patient with resultant right hand/arm severe dysmetria, scanning dysarthria, upgaze paralysis, lethargic. Given acute hemorrhage, patient at risk for hemorrhage, extension, cerebral edema, herniation, obstructive hydrocephalus, therefore Dr. [name] performed a Suboccipital Craniectomy for Evacuation of Cerebellar Hematoma with microdissection 10/25/2012, POD #3. Patient with significant ataxia.   Hypokalemia, 3.4 Headache secondary to stroke/hmg Nausea, wretching secondary to  stroke/hmg Hypertension Diabetes, HgbA1c 6.7 CAD - OHS, cardiac stents Cigarette smoker etoh use - concerned with possible withdrawal  Hospital day # 4  TREATMENT/PLAN  Transfer to floor, or rehab if insurance approval obtained  Medically stable for discharge to rehab today  Annie Main, MSN, RN, ANVP-BC, ANP-BC, [facility] Pager: [phone] 10/28/2012 8:45 AM I have personally obtained a history, examined the patient, evaluated imaging results, and formulated the assessment and plan of care. I agree with the above.  Delia Heady, MD

## 2012-10-28 NOTE — Progress Notes (Signed)
Insurance has approved an Inpt rehab stay and bed is available for today. We will plan admit. [phone]

## 2012-10-29 ENCOUNTER — Inpatient Hospital Stay (HOSPITAL_COMMUNITY): Payer: Medicare Other | Admitting: Speech Pathology

## 2012-10-29 ENCOUNTER — Inpatient Hospital Stay (HOSPITAL_COMMUNITY): Payer: BC Managed Care – PPO | Admitting: Physical Therapy

## 2012-10-29 ENCOUNTER — Inpatient Hospital Stay (HOSPITAL_COMMUNITY): Payer: Medicare Other | Admitting: *Deleted

## 2012-10-29 ENCOUNTER — Inpatient Hospital Stay (HOSPITAL_COMMUNITY): Payer: Medicare Other

## 2012-10-29 DIAGNOSIS — M79609 Pain in unspecified limb: Secondary | ICD-10-CM

## 2012-10-29 DIAGNOSIS — R05 Cough: Secondary | ICD-10-CM | POA: Insufficient documentation

## 2012-10-29 DIAGNOSIS — R51 Headache: Secondary | ICD-10-CM | POA: Diagnosis present

## 2012-10-29 DIAGNOSIS — M7989 Other specified soft tissue disorders: Secondary | ICD-10-CM

## 2012-10-29 DIAGNOSIS — R519 Headache, unspecified: Secondary | ICD-10-CM | POA: Diagnosis present

## 2012-10-29 DIAGNOSIS — I619 Nontraumatic intracerebral hemorrhage, unspecified: Secondary | ICD-10-CM

## 2012-10-29 LAB — GLUCOSE, CAPILLARY
Glucose-Capillary: 180 mg/dL — ABNORMAL HIGH (ref 70–99)
Glucose-Capillary: 196 mg/dL — ABNORMAL HIGH (ref 70–99)

## 2012-10-29 MED ORDER — TRAMADOL HCL 50 MG PO TABS
50.0000 mg | ORAL_TABLET | Freq: Two times a day (BID) | ORAL | Status: DC | PRN
  Administered 2012-10-30: 50 mg via ORAL
  Filled 2012-10-29: qty 2
  Filled 2012-10-29: qty 1

## 2012-10-29 MED ORDER — PANTOPRAZOLE SODIUM 40 MG PO PACK
40.0000 mg | PACK | Freq: Every day | ORAL | Status: DC
  Administered 2012-10-29 – 2012-11-09 (×12): 40 mg via ORAL
  Filled 2012-10-29 (×14): qty 20

## 2012-10-29 MED ORDER — LOSARTAN POTASSIUM 50 MG PO TABS
100.0000 mg | ORAL_TABLET | Freq: Every day | ORAL | Status: DC
  Administered 2012-10-29 – 2012-11-15 (×18): 100 mg via ORAL
  Filled 2012-10-29 (×19): qty 2

## 2012-10-29 NOTE — Progress Notes (Signed)
Physical Therapy Note  [name] MRN: [id number] Date of Birth: 1935/04/14 Today's Date: 10/29/2012  Time: 1450-1520 30 minutes  No c/o pain.  NMR with focus on trunk and limb coordination and control.  Standing stepping fwd/backward/laterally multiple attempts with pt limited by increased posterior lean this session vs eval, required mod-max A for balance and coordination.  Pt with more difficulty problem solving and sequencing this session ? Due to fatigue.  Sit <> stand with focus on anterior wt shift and eccentric control with mod manual facilitation.  Individual therapy   Adeyemi Hamad 10/29/2012, 3:22 PM

## 2012-10-29 NOTE — Progress Notes (Signed)
Bill Jackson is a 78 y.o. male 11-Oct-1934 [id number]  Subjective: C/o cough and HAs w/coughing - bad. Slept ok. Feeling OK.  Objective: Vital signs in last 24 hours: Temp:  [98.1 F (36.7 C)-99 F (37.2 C)] 98.1 F (36.7 C) (03/29 0600) Pulse Rate:  [58-81] 58 (03/29 0600) Resp:  [16-22] 18 (03/29 0600) BP: (108-166)/(66-88) 134/72 mmHg (03/29 0600) SpO2:  [97 %-100 %] 97 % (03/29 0600) Weight:  [213 lb 6.5 oz (96.8 kg)] 213 lb 6.5 oz (96.8 kg) (03/28 1530) Weight change:  Last BM Date: 10/28/12  Intake/Output from previous day: 03/28 0701 - 03/29 0700 In: 240 [P.O.:240] Out: -  Last cbgs: CBG (last 3)   Recent Labs  10/28/12 1649 10/28/12 2102 10/29/12 0727  GLUCAP 180* 176* 238*     Physical Exam General: No apparent distress    HEENT: moist mucosa Lungs: Normal effort. Lungs clear to auscultation, no crackles or wheezes. Cardiovascular: Regular rate and rhythm, no edema Musculoskeletal:  No change from before Neurological: No new neurological deficits Wounds: N/A    Skin: clear Alert, cooperative   Lab Results: BMET    Component Value Date/Time   NA 142 10/27/2012 0440   K 3.4* 10/27/2012 0440   CL 106 10/27/2012 0440   CO2 23 10/27/2012 0440   GLUCOSE 115* 10/27/2012 0440   BUN 10 10/27/2012 0440   CREATININE 1.04 10/27/2012 0440   CALCIUM 8.3* 10/27/2012 0440   GFRNONAA 68* 10/27/2012 0440   GFRAA 78* 10/27/2012 0440   CBC    Component Value Date/Time   WBC 10.3 10/27/2012 0440   RBC 4.36 10/27/2012 0440   HGB 14.3 10/27/2012 0440   HCT 41.5 10/27/2012 0440   PLT 196 10/27/2012 0440   MCV 95.2 10/27/2012 0440   MCH 32.8 10/27/2012 0440   MCHC 34.5 10/27/2012 0440   RDW 14.3 10/27/2012 0440   LYMPHSABS 2.1 02/09/2012 2100   MONOABS 0.2 02/09/2012 2100   EOSABS 0.0 02/09/2012 2100   BASOSABS 0.0 02/09/2012 2100    Studies/Results: No results found.  Medications: I have reviewed the patient's current medications.  Assessment/Plan:  1. DVT  Prophylaxis/Anticoagulation: Mechanical: Sequential compression devices, below knee Bilateral lower extremities  2. Headaches associated w/cough: not improving. Will d/c ACE. Start Tramadol, Losartan 3. Mood: Seems to be in good spirits. Will have LCSW follow for formal evaluation.  -may need some motivation at times however.  4. Neuropsych: This patient is capable of making decisions on his/her own behalf.  5. HTN: Will monitor with BID checks. Starting to trend upwards with increase in activity. May need additional agent. D/c Lotensin. Monitor renal status as on Nectar liquids. Start Losartan 6. DM type 2: Was on 70/30 insulin additionally at home-Hgb A1C @ 6.7. Monitor BS with AC/HS cbg checks. Continue metformin and glipizide. Will monitor trends to see if insulin needs to be resumed.  7. 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 Continue thiamine.  8. Hypokalemia: Likely dilutional due to ongoing IVF. Will change fluids to bedtime.  9. Dysphagia: Needs supervision for meals. IVF at [facility] to help with hydration.      Length of stay, days: 1  Sonda Primes , MD 10/29/2012, 9:29 AM

## 2012-10-29 NOTE — Evaluation (Signed)
Physical Therapy Assessment and Plan  [name] MRN: [id number] Date of Birth: 1934/10/30  PT Diagnosis: Ataxia, Ataxic gait, Cognitive deficits and Difficulty walking Rehab Potential: Good ELOS: 2 weeks   Today's Date: 10/29/2012 Time: 0900-1000 Time Calculation (min): 60 min  Problem List:  Patient Active Problem List  Diagnosis  . Type 2 diabetes mellitus  . Hypertension  . OA (osteoarthritis) of knee-right  . Accelerated hypertension  . Hypokalemia  . Acute cerebellar hemorrhage  . Encounter for long-term (current) use of medications  . Headache  . Cough    Past Medical History:  Past Medical History  Diagnosis Date  . Hypertension   . Diabetes mellitus    Past Surgical History:  Past Surgical History  Procedure Laterality Date  . Heart stents  x3  . Cardiac surgery    . Hernia repair    . Rotator cuff repair    . Craniectomy N/A 10/25/2012    Procedure: Suboccipital Craniectomy for Evacuation of Cerebellar Hematoma;  Surgeon: Maeola Harman, MD;  Location: [facility];  Service: Neurosurgery;  Laterality: N/A;  Suboccipital Craniectomy for Evacuation of Cerebellar Hematoma    Assessment & Plan Clinical Impression: Patient is a 78 y.o. year old male with recent admission to the hospital on 10/25/12 with acute onset of nausea and vomiting, dizziness as well as ataxia. CT scan of the head showed a 5 x 3.5 cm acute cerebellar hemorrhage with mild mass effect on fourth ventricle. Fourth ventricle is patent and patient had no signs of hydrocephalus. He was evaluated by Dr. [name] and underwent emergent suboccipital craniectomy for evacuation of hematoma that pm. Neurology felt that patient with hemorrhage due to accelerated HTN. Patient with resultant dysarthria, weak cough, dysphagia, significant dysmetria RUE and significant visual deficits. Patient transferred to CIR on 10/28/2012 .   Patient currently requires max with mobility secondary to  muscle weakness, ataxia and decreased coordination, decreased problem solving and decreased safety awareness and decreased sitting balance, decreased standing balance, decreased postural control and decreased balance strategies.  Prior to hospitalization, patient was independent  with mobility and lived with Alone in a House home.  Home access is 2Stairs to enter.  Patient will benefit from skilled PT intervention to maximize safe functional mobility, minimize fall risk and decrease caregiver burden for planned discharge home with 24 hour supervision.  Anticipate patient will benefit from follow up OP at discharge.  PT - End of Session Activity Tolerance: Tolerates 30+ min activity with multiple rests Endurance Deficit: Yes PT Assessment Rehab Potential: Good Barriers to Discharge: Decreased caregiver support PT Plan PT Intensity: Minimum of 1-2 x/day ,45 to 90 minutes PT Frequency: 5 out of 7 days PT Duration Estimated Length of Stay: 2 weeks PT Treatment/Interventions: Ambulation/gait training;Balance/vestibular training;Discharge planning;Functional mobility training;Therapeutic Activities;Therapeutic Exercise;Wheelchair propulsion/positioning;Neuromuscular re-education;DME/adaptive equipment instruction;Pain management;Splinting/orthotics;UE/LE Strength taining/ROM;UE/LE Media planner education;Functional electrical stimulation;Community reintegration PT Recommendation Follow Up Recommendations: Outpatient PT;24 hour supervision/assistance Patient destination: Home  Skilled Therapeutic Intervention Stair training with B handrail for LE coordination and strengthening 2 x 3 stairs with mod A for eccentric LE control.  Gait training with RW, pt with difficulty controlling RW due to ataxia in UEs, required mod-max A for safety with gait.  Gait with pt's hands on PT's shoulders, pt able to gait more safely and with improved trunk control, fwd and backward  stepping with mod A for balance due to posterior lean and trunk ataxia.  PT Evaluation Precautions/Restrictions Precautions Precautions: Fall  Restrictions Weight Bearing Restrictions: No Pain Pain Assessment Faces Pain Scale: Hurts even more Pain Type: Surgical pain Pain Location: Head Pain Onset: With Activity Pain Intervention(s): RN made aware;Rest Home Living/Prior Functioning Home Living Lives With: Alone Available Help at Discharge: Family;Friend(s) Type of Home: House Home Access: Stairs to enter [employer] of Steps: 2 Entrance Stairs-Rails: None Home Layout: One level Prior Function Level of Independence: Independent with basic ADLs;Independent with transfers;Independent with gait Able to Take Stairs?: Yes Driving: Yes Vocation: Retired   IT consultant Overall Cognitive Status: Appears within functional limits for tasks assessed Sensation Sensation Light Touch: Appears Intact Proprioception: Impaired by gross assessment Coordination Gross Motor Movements are Fluid and Coordinated: No Coordination and Movement Description: ataxic limb and trunk movements Motor  Motor Motor: Abnormal tone;Ataxia  Mobility Bed Mobility Supine to Sit: 5: Supervision Supine to Sit Details (indicate cue type and reason): increased time, cues for initiation and sequencing Transfers Stand Pivot Transfers: 2: Max assist Stand Pivot Transfer Details (indicate cue type and reason): assist for wt shifts and balance due to ataxia Squat Pivot Transfers: 3: Mod assist Squat Pivot Transfer Details (indicate cue type and reason): cuing for UE/LE placement and set up, manual facilitation for wt shifts Locomotion  Ambulation Ambulation: Yes Ambulation/Gait Assistance: 2: Max assist Ambulation Distance (Feet): 10 Feet Assistive device: None Ambulation/Gait Assistance Details: manual facilitation for wt shifts and grading movement, use of UEs on therapists shoulders for trunk  support and control Stairs / Additional Locomotion Stairs: Yes Stairs Assistance: 3: Mod assist Stairs Assistance Details (indicate cue type and reason): assist for wt shifts, mod A to descend stairs for eccentri quad control Stair Management Technique: Two rails Number of Stairs: 3 Wheelchair Mobility Wheelchair Mobility: Yes Wheelchair Assistance: 5: Investment banker, operational: Both upper extremities Wheelchair Parts Management: Needs assistance Distance: 150'  Trunk/Postural Assessment  Cervical Assessment Cervical Assessment:  (limited ROM due to pain) Thoracic Assessment Thoracic Assessment: Within Functional Limits Lumbar Assessment Lumbar Assessment:  (decreased AROM) Postural Control Postural Control: Deficits on evaluation (ataxic trunk) Righting Reactions: delayed  Balance Static Sitting Balance Static Sitting - Level of Assistance: 5: Stand by assistance Dynamic Sitting Balance Dynamic Sitting - Balance Support: During functional activity Dynamic Sitting - Level of Assistance: 4: Min Oncologist Standing - Balance Support: During functional activity Static Standing - Level of Assistance: 4: Min assist Dynamic Standing Balance Dynamic Standing - Balance Support: During functional activity Dynamic Standing - Level of Assistance: 2: Max assist Extremity Assessment      RLE Assessment RLE Assessment: Within Functional Limits LLE Assessment LLE Assessment:  (WFL except knee extension limited by hamstring tightness)  FIM:  FIM - Bed/Chair Transfer Bed/Chair Transfer: 2: Chair or W/C > Bed: Max A (lift and lower assist);2: Bed > Chair or W/C: Max A (lift and lower assist) FIM - Locomotion: Wheelchair Distance: 150' Locomotion: Wheelchair: 5: Travels 150 ft or more: maneuvers on rugs and over door sills with supervision, cueing or coaxing FIM - Locomotion: Ambulation Ambulation/Gait Assistance: 2: Max assist Locomotion:  Ambulation: 1: Travels less than 50 ft with moderate assistance (Pt: 50 - 74%) FIM - Locomotion: Stairs Locomotion: Stairs: 1: Up and Down < 4 stairs with moderate assistance (Pt: 50 - 74%)   Refer to Care Plan for Long Term Goals  Recommendations for other services: None  Discharge Criteria: Patient will be discharged from PT if patient refuses treatment 3 consecutive times without medical reason, if treatment goals not met, if  there is a change in medical status, if patient makes no progress towards goals or if patient is discharged from hospital.  The above assessment, treatment plan, treatment alternatives and goals were discussed and mutually agreed upon: by patient  [facility] 10/29/2012, 10:16 AM

## 2012-10-29 NOTE — Evaluation (Signed)
Speech Language Pathology Assessment and Plan  [name] MRN: [id number] Date of Birth: 02-Aug-1935  SLP Diagnosis: Dysarthria;Cognitive Impairments;Dysphagia  Rehab Potential: Good ELOS: Two weeks   Today's Date: 10/29/2012 Time: 1300-1400 Time Calculation (min): 60 min  Problem List:  Patient Active Problem List  Diagnosis  . Type 2 diabetes mellitus  . Hypertension  . OA (osteoarthritis) of knee-right  . Accelerated hypertension  . Hypokalemia  . Acute cerebellar hemorrhage  . Encounter for long-term (current) use of medications  . Headache  . Cough   Past Medical History:  Past Medical History  Diagnosis Date  . Hypertension   . Diabetes mellitus    Past Surgical History:  Past Surgical History  Procedure Laterality Date  . Heart stents  x3  . Cardiac surgery    . Hernia repair    . Rotator cuff repair    . Craniectomy N/A 10/25/2012    Procedure: Suboccipital Craniectomy for Evacuation of Cerebellar Hematoma;  Surgeon: Maeola Harman, MD;  Location: [facility];  Service: Neurosurgery;  Laterality: N/A;  Suboccipital Craniectomy for Evacuation of Cerebellar Hematoma    Assessment / Plan / Recommendation Clinical Impression  The patient is a 78 year old male with history of hypertension, diabetes mellitus, who was admitted on 10/25/12, with acute onset of nausea and vomiting, dizziness as well as ataxia. CT scan of the head showed a 5 x 3.5 cm acute cerebellar hemorrhage with mild mass effect on fourth ventricle.   He was evaluated by Dr. [name] and underwent emergent suboccipital craniectomy for evacuation of hematoma that evening. Neurology felt that patient with hemorrhage due to accelerated hypertension.  The patient is now admitted to 4th floor for rehabilitation.  The patient was seen immediately after lunch and declined further p.o. intake.  Thickened liquids were noted on tray, however.  The patient does present with dysarthria as well  as cognitive-linguistic deficits demonstrated by impaired sustained attention, impaired retrieval of new information, as well as functional problem solving deficits.  Patient lives independently in community, and therapeutic intervention is necessary to return him to his prior level of independent living.     SLP Assessment  Patient will need skilled Speech Lanaguage Pathology Services during CIR admission    Recommendations  Postural Changes and/or Swallow Maneuvers: Seated upright 90 degrees Oral Care Recommendations: Oral care BID Patient destination: Home Follow up Recommendations: Home Health SLP Equipment Recommended: None recommended by SLP    SLP Frequency 5 out of 7 days   SLP Treatment/Interventions Cognitive remediation/compensation;Dysphagia/aspiration precaution training;Environmental controls;Internal/external aids;Oral motor exercises;Patient/family education;Speech/Language facilitation    Pain Pain Assessment Pain Assessment: No/denies pain Prior Functioning    Short Term Goals: Week 1: SLP Short Term Goal 1 (Week 1): Patient will perform oral motor exercises with minimal verbal/tactile cues. (New goal) SLP Short Term Goal 2 (Week 1): Patient will select attention to basic and familiar tasks with minimum miultimodal cues.  SLP Short Term Goal 3 (Week 1): Patient will demonstrate mildly complex problem solving abilities during functional tasks with minimal verbal cues.  SLP Short Term Goal 4 (Week 1): Patient will utilize external memory aids to faciliate retrieval of basic daily information with minimal verbal cues. SLP Short Term Goal 5 (Week 1): Patient will utilize compensatory strategies to increase intelligible speech, with minimal verbal and visual cues.    See FIM for current functional status Refer to Care Plan for Long Term Goals  Recommendations for other services: None  Discharge Criteria:  Patient will be discharged from SLP if patient refuses treatment 3  consecutive times without medical reason, if treatment goals not met, if there is a change in medical status, if patient makes no progress towards goals or if patient is discharged from hospital.  The above assessment, treatment plan, treatment alternatives and goals were discussed and mutually agreed upon: by patient  Bill Jackson 10/29/2012, 7:46 PM

## 2012-10-29 NOTE — Progress Notes (Signed)
Bilateral:  No evidence of DVT, superficial thrombosis, or Baker's Cyst.   

## 2012-10-29 NOTE — Evaluation (Signed)
Occupational Therapy Assessment and Plan  [name] MRN: [id number] Date of Birth: [birth year]  OT Diagnosis: ataxia and muscle weakness (generalized) Rehab Potential: Rehab Potential: Good ELOS: 2 weeks   Today's Date: 10/29/2012 Time: 1100-1200   Time Calculation (min): 60 min  Problem List:  Patient Active Problem List  Diagnosis  . Type 2 diabetes mellitus  . Hypertension  . OA (osteoarthritis) of knee-right  . Accelerated hypertension  . Hypokalemia  . Acute cerebellar hemorrhage  . Encounter for long-term (current) use of medications  . Headache  . Cough    Past Medical History:  Past Medical History  Diagnosis Date  . Hypertension   . Diabetes mellitus    Past Surgical History:  Past Surgical History  Procedure Laterality Date  . Heart stents  x3  . Cardiac surgery    . Hernia repair    . Rotator cuff repair    . Craniectomy N/A 10/25/2012    Procedure: Suboccipital Craniectomy for Evacuation of Cerebellar Hematoma;  Surgeon: Maeola Harman, MD;  Location: [facility];  Service: Neurosurgery;  Laterality: N/A;  Suboccipital Craniectomy for Evacuation of Cerebellar Hematoma    Assessment & Plan Clinical Impression: Bill Jackson is a 78 y.o. male with history of hypertension, diabetes mellitus, who was admitted on 10/25/12 with acute onset of nausea and vomiting, dizziness as well as ataxia. CT scan of the head showed a 5 x 3.5 cm acute cerebellar hemorrhage with mild mass effect on fourth ventricle. Fourth ventricle is patent and patient had no signs of hydrocephalus. He was evaluated by Dr. [name] and underwent emergent suboccipital craniectomy for evacuation of hematoma that pm. Neurology felt that patient with hemorrhage due to accelerated HTN. Patient with resultant dysarthria, weak cough, dysphagia, significant dysmetria RUE and significant visual deficits. Diet was changed to D3, nectar liquids as mentation improved. He has had  intermittent cough since admission but no overt s/s of aspiration.  Follow up CCT with evacuation of most of cerebellar hematoma and diffuse pneumocephalus. Headaches continue but improving. Lethargy resolving. Neurology feels that hemorrhage due to accelerated HTN-BP 201/94 at admission. Was not on antiplatelets PTA.    Marland Kitchen  Patient transferred to CIR on 10/28/2012 .    Patient currently requires max with basic self-care skills secondary to muscle weakness, ataxia and decreased coordination, decreased motor planning and decreased attention, decreased awareness, decreased problem solving, decreased safety awareness and decreased memory.  Prior to hospitalization, patient could complete BADL with independent .  Patient will benefit from skilled intervention to decrease level of assist with basic self-care skills and increase independence with basic self-care skills prior to discharge home with care partner.  Anticipate patient will require intermittent supervision and follow up home health.  OT - End of Session Activity Tolerance: Tolerates 10 - 20 min activity with multiple rests Endurance Deficit: Yes OT Assessment Rehab Potential: Good Barriers to Discharge: None OT Plan OT Intensity: Minimum of 1-2 x/day, 45 to 90 minutes OT Frequency: 5 out of 7 days OT Duration/Estimated Length of Stay: 2 weeks OT Treatment/Interventions: Balance/vestibular training;Community reintegration;Cognitive remediation/compensation;Discharge planning;DME/adaptive equipment instruction;Functional mobility training;Neuromuscular re-education;Patient/family education;Pain management;Self Care/advanced ADL retraining;Therapeutic Activities;Therapeutic Exercise;UE/LE Strength taining/ROM;UE/LE Coordination activities;Visual/perceptual remediation/compensation;Wheelchair propulsion/positioning   Skilled Therapeutic Intervention   OT Evaluation Precautions/Restrictions  Precautions Precautions:  Fall Restrictions Weight Bearing Restrictions: No General      Pain Pain Assessment Pain Assessment: 0-10 Pain Score: Asleep Faces Pain Scale: No hurt Pain Type: Acute pain Pain Location:  Head Pain Frequency: Intermittent Pain Onset: With Activity Pain Intervention(s): Medication (See eMAR) Home Living/Prior Functioning Home Living Lives With: Alone Available Help at Discharge: Family;Friend(s) Type of Home: House Home Access: Stairs to enter [employer] of Steps: 2 Entrance Stairs-Rails: None Home Layout: One level Bathroom Shower/Tub: Tub/shower unit;Curtain;Other (comment) (2 grab bars) Bathroom Toilet: Handicapped height Bathroom Accessibility: Yes How Accessible: Accessible via walker Home Adaptive Equipment: Grab bars in shower;Shower chair with back;Hand-held shower hose IADL History Homemaking Responsibilities: Yes Meal Prep Responsibility: Secondary Laundry Responsibility: Secondary Cleaning Responsibility: Secondary Bill Paying/Finance Responsibility: Primary Shopping Responsibility: Primary Mode of Transportation: Car Occupation: Retired Leisure and Hobbies: hikes, fish Prior Function Level of Independence: Independent with basic ADLs;Independent with homemaking with ambulation Able to Take Stairs?: Yes Driving: Yes Vocation: Retired Leisure: Hobbies-yes (Comment) Comments: hikes, fish ADL ADL Grooming: Minimal assistance Where Assessed-Grooming: Sitting at sink Upper Body Bathing: Minimal assistance Where Assessed-Upper Body Bathing: Sitting at sink Lower Body Bathing: Moderate assistance Where Assessed-Lower Body Bathing: Chair;Sitting at sink Upper Body Dressing: Minimal assistance Where Assessed-Upper Body Dressing: Chair Lower Body Dressing: Moderate assistance Where Assessed-Lower Body Dressing: Chair;Sitting at sink Toileting: Not assessed Toilet Transfer: Not assessed Toilet Transfer Method: Not assessed Tub/Shower  Transfer: Not assessed Film/video editor: Not assessed Vision/Perception  Vision - History Baseline Vision: Bifocals Visual History:  (poor historian) Patient Visual Report: Other (comment);Undershooting Praxis Praxis: Intact  Cognition Overall Cognitive Status: Impaired at baseline Orientation Level: Disoriented to time;Disoriented to situation Attention: Sustained Sustained Attention Impairment: Functional complex Memory: Impaired Memory Impairment: Decreased recall of new information Awareness: Impaired Awareness Impairment: Emergent impairment;Anticipatory impairment Problem Solving: Impaired Problem Solving Impairment: Functional basic;Verbal basic Decision Making: Impaired Decision Making Impairment: Functional basic Safety/Judgment: Impaired Sensation Sensation Light Touch: Appears Intact (intact for BUE) Proprioception: Impaired by gross assessment Coordination Gross Motor Movements are Fluid and Coordinated: No Fine Motor Movements are Fluid and Coordinated: No Coordination and Movement Description:  (BUE dcreased coordination for moving from one place to anoth) Motor  Motor Motor: Motor apraxia Motor - Skilled Clinical Observations:  (BUE demonstrate impaired timing and overshooting) Mobility  Bed Mobility Bed Mobility: Sit to Supine Supine to Sit: 5: Supervision Supine to Sit Details (indicate cue type and reason): increased time, cues for initiation and sequencing Sit to Supine: 3: Mod assist Transfers Sit to Stand: 1: +1 Total assist;With upper extremity assist Sit to Stand Details: Verbal cues for gait pattern;Verbal cues for technique;Manual facilitation for placement Sit to Stand Details (indicate cue type and reason): Pt. uses UBUE to hold to OT or bed rail  Trunk/Postural Assessment  Cervical Assessment Cervical Assessment: Exceptions to [facility] Cervical AROM Overall Cervical AROM: Other (comment) (limited AROM due to pain) Thoracic  Assessment Thoracic Assessment: Within Functional Limits Lumbar Assessment Lumbar Assessment: Within Functional Limits Postural Control Postural Control: Deficits on evaluation Righting Reactions: delayed Postural Limitations: decreased trunk strength  Balance Balance Balance Assessed: Yes Static Sitting Balance Static Sitting - Balance Support: Bilateral upper extremity supported Static Sitting - Level of Assistance: 5: Stand by assistance Dynamic Sitting Balance Dynamic Sitting - Balance Support: During functional activity Dynamic Sitting - Level of Assistance: 4: Min assist Dynamic Sitting - Balance Activities: Reaching for objects;Reaching across midline;Trunk control activities;Lateral lean/weight shifting Static Standing Balance Static Standing - Balance Support: During functional activity Static Standing - Level of Assistance: 4: Min assist Dynamic Standing Balance Dynamic Standing - Balance Support: During functional activity Dynamic Standing - Level of Assistance: 2: Max assist Extremity/Trunk Assessment RUE Assessment  RUE Assessment: Within Functional Limits LUE Assessment LUE Assessment: Within Functional Limits  FIM:  FIM - Bed/Chair Transfer Bed/Chair Transfer: 2: Chair or W/C > Bed: Max A (lift and lower assist);2: Bed > Chair or W/C: Max A (lift and lower assist)   Refer to Care Plan for Long Term Goals  Recommendations for other services: None  Discharge Criteria: Patient will be discharged from OT if patient refuses treatment 3 consecutive times without medical reason, if treatment goals not met, if there is a change in medical status, if patient makes no progress towards goals or if patient is discharged from hospital.  The above assessment, treatment plan, treatment alternatives and goals were discussed and mutually agreed upon: by patient  Humberto Seals 10/29/2012, 12:11 PM

## 2012-10-30 ENCOUNTER — Inpatient Hospital Stay (HOSPITAL_COMMUNITY): Payer: Medicare Other | Admitting: Occupational Therapy

## 2012-10-30 MED ORDER — HYDROCHLOROTHIAZIDE 25 MG PO TABS
25.0000 mg | ORAL_TABLET | Freq: Every day | ORAL | Status: DC
  Administered 2012-10-30 – 2012-11-15 (×17): 25 mg via ORAL
  Filled 2012-10-30 (×18): qty 1

## 2012-10-30 MED ORDER — POTASSIUM CHLORIDE CRYS ER 20 MEQ PO TBCR
20.0000 meq | EXTENDED_RELEASE_TABLET | Freq: Every day | ORAL | Status: DC
  Administered 2012-10-30: 20 meq via ORAL
  Filled 2012-10-30 (×3): qty 1

## 2012-10-30 MED ORDER — METFORMIN HCL 500 MG PO TABS
1000.0000 mg | ORAL_TABLET | Freq: Two times a day (BID) | ORAL | Status: DC
Start: 2012-10-30 — End: 2012-11-15
  Administered 2012-10-30 – 2012-11-15 (×32): 1000 mg via ORAL
  Filled 2012-10-30 (×34): qty 2

## 2012-10-30 NOTE — Progress Notes (Signed)
Bill Jackson is a 78 y.o. male 1935/08/03 [id number]  Subjective: C/o cough and HAs w/coughing - bad. Slept ok. Feeling OK. He was choking on food this am  Objective: Vital signs in last 24 hours: Temp:  [98.5 F (36.9 C)-99.3 F (37.4 C)] 99.3 F (37.4 C) (03/30 0557) Pulse Rate:  [55-78] 55 (03/30 0904) Resp:  [18-20] 20 (03/30 0557) BP: (130-150)/(68-87) 131/73 mmHg (03/30 0904) SpO2:  [94 %-99 %] 94 % (03/30 0557) Weight change:  Last BM Date: 10/29/12  Intake/Output from previous day: 03/29 0701 - 03/30 0700 In: 920 [P.O.:920] Out: 1450 [Urine:1450] Last cbgs: CBG (last 3)   Recent Labs  10/29/12 0727 10/29/12 1200 10/29/12 1620  GLUCAP 238* 180* 196*     Physical Exam General: No apparent distress    HEENT: moist mucosa Lungs: Normal effort. Lungs clear to auscultation, no crackles or wheezes. Decreased BS at bases Cardiovascular: Regular rate and rhythm, no edema Musculoskeletal:  No change from before Neurological: No new neurological deficits Wounds: N/A    Skin: clear Alert, cooperative   Lab Results: BMET    Component Value Date/Time   NA 142 10/27/2012 0440   K 3.4* 10/27/2012 0440   CL 106 10/27/2012 0440   CO2 23 10/27/2012 0440   GLUCOSE 115* 10/27/2012 0440   BUN 10 10/27/2012 0440   CREATININE 1.04 10/27/2012 0440   CALCIUM 8.3* 10/27/2012 0440   GFRNONAA 68* 10/27/2012 0440   GFRAA 78* 10/27/2012 0440   CBC    Component Value Date/Time   WBC 10.3 10/27/2012 0440   RBC 4.36 10/27/2012 0440   HGB 14.3 10/27/2012 0440   HCT 41.5 10/27/2012 0440   PLT 196 10/27/2012 0440   MCV 95.2 10/27/2012 0440   MCH 32.8 10/27/2012 0440   MCHC 34.5 10/27/2012 0440   RDW 14.3 10/27/2012 0440   LYMPHSABS 2.1 02/09/2012 2100   MONOABS 0.2 02/09/2012 2100   EOSABS 0.0 02/09/2012 2100   BASOSABS 0.0 02/09/2012 2100    Studies/Results: Dg Chest 2 View  10/29/2012  *RADIOLOGY REPORT*  Clinical Data:  Cough, hypertension, diabetes, smoker  CHEST - 2 VIEW  Comparison:  10/24/2012  Findings: Normal heart size, mediastinal contours, and pulmonary vascularity. Lateral view suggests presence of bibasilar dependent pleural effusion posteriorly with question consolidation at a posterior lung base likely within right lower lobe. Upper lungs clear. No pleural effusion or pneumothorax.  IMPRESSION: Bibasilar effusions with question infiltrate right lower lobe.   Original Report Authenticated By: Ulyses Southward, M.D.     Medications: I have reviewed the patient's current medications.  Assessment/Plan:  1. DVT Prophylaxis/Anticoagulation: Mechanical: Sequential compression devices, below knee Bilateral lower extremities  2. Headaches associated w/cough: not improving. Will d/c ACE. Start Tramadol, Losartan 3. Mood: Seems to be in good spirits. Will have LCSW follow for formal evaluation.  -may need some motivation at times however.  4. Neuropsych: This patient is capable of making decisions on his/her own behalf.  5. HTN: Will monitor with BID checks. Starting to trend upwards with increase in activity. May need additional agent. D/c Lotensin. Monitor renal status as on Nectar liquids. Start Losartan 6. DM type 2: Was on 70/30 insulin additionally at home-Hgb A1C @ 6.7. Monitor BS with AC/HS cbg checks. Continue metformin and glipizide. Will monitor trends to see if insulin needs to be resumed.  7. 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 Continue thiamine.  8. Hypokalemia: Likely dilutional due to ongoing IVF. Will change fluids to bedtime.  Added PO KCl. Labs in am on Mon 9. Dysphagia - worse: Needs supervision for meals. IVF at [facility] to help with hydration. Changed to "Dysphagia 2" diet 10. Cough. Lotensin was stopped.  11. B pleural effusion and atelectases. Flutter valve. Add HCTZ     Length of stay, days: 2  Sonda Primes , MD 10/30/2012, 9:14 AM

## 2012-10-30 NOTE — Progress Notes (Signed)
At 2245, patient noted with nausea and vomiting x 1. Emesis was a large amount of undigested food on the bed and floor. After cleaning and making patient comfortable, Compazine 10 mg given IM with reported relief. Vital signs taken and stable. Patient fell asleep shortly after receiving medication. Attempting to get OOB x 3 this shift. Will continue to monitor patient.

## 2012-10-30 NOTE — Progress Notes (Signed)
Occupational Therapy Session Note  [name] MRN: [id number] Date of Birth: 06/09/1935  Today's Date: 10/30/2012 Time: 0930-1025 Time Calculation (min): 55 min  Skilled Therapeutic Interventions/Progress Updates: ADL in w/c at sink.  Focus on exhibiting trunk strength and stability while seated and while standing to pull up pants (patient indicated he had previously completed batheing of periarea/buttocks).  Patient required 2 people with one only standing by for safety as patient initially slightly "wobbly" trunk during transfers, forwarded movements while seated, and while standing to complete tasks.  Though patient's speech was difficult for this clinician to understand during the session, he provided humor and laughed often during the session.  Hi very supportive caregiver, "Bill Jackson" was present for the OT session and stood by to provide safety support while patient stood or transferred and offered to help in any other way needed.  Patient's sister arrived at the end of the session.    Therapy Documentation Precautions:  Precautions Precautions: Fall Restrictions Weight Bearing Restrictions: No  Pain: 5/10 low back aching when moving forward in chair "from laying in the bed" per patient's personal caregiver  See FIM for current functional status  Therapy/Group: Individual Therapy  Bud Face [facility] 10/30/2012, 4:48 PM

## 2012-10-31 ENCOUNTER — Inpatient Hospital Stay (HOSPITAL_COMMUNITY): Payer: Medicare Other | Admitting: Speech Pathology

## 2012-10-31 ENCOUNTER — Inpatient Hospital Stay (HOSPITAL_COMMUNITY): Payer: Medicare Other

## 2012-10-31 ENCOUNTER — Encounter (HOSPITAL_COMMUNITY): Payer: BC Managed Care – PPO | Admitting: Occupational Therapy

## 2012-10-31 ENCOUNTER — Inpatient Hospital Stay (HOSPITAL_COMMUNITY): Payer: BC Managed Care – PPO | Admitting: Physical Therapy

## 2012-10-31 ENCOUNTER — Inpatient Hospital Stay (HOSPITAL_COMMUNITY): Payer: Medicare Other | Admitting: Physical Therapy

## 2012-10-31 DIAGNOSIS — I619 Nontraumatic intracerebral hemorrhage, unspecified: Secondary | ICD-10-CM

## 2012-10-31 DIAGNOSIS — I10 Essential (primary) hypertension: Secondary | ICD-10-CM

## 2012-10-31 LAB — CBC WITH DIFFERENTIAL/PLATELET
Basophils Relative: 0 % (ref 0–1)
Hemoglobin: 13.8 g/dL (ref 13.0–17.0)
Lymphocytes Relative: 33 % (ref 12–46)
Lymphs Abs: 1.9 10*3/uL (ref 0.7–4.0)
Monocytes Relative: 9 % (ref 3–12)
Neutro Abs: 3.3 10*3/uL (ref 1.7–7.7)
Neutrophils Relative %: 57 % (ref 43–77)
RBC: 4.36 MIL/uL (ref 4.22–5.81)
WBC: 5.8 10*3/uL (ref 4.0–10.5)

## 2012-10-31 LAB — GLUCOSE, CAPILLARY
Glucose-Capillary: 201 mg/dL — ABNORMAL HIGH (ref 70–99)
Glucose-Capillary: 191 mg/dL — ABNORMAL HIGH (ref 70–99)
Glucose-Capillary: 171 mg/dL — ABNORMAL HIGH (ref 70–99)
Glucose-Capillary: 227 mg/dL — ABNORMAL HIGH (ref 70–99)
Glucose-Capillary: 165 mg/dL — ABNORMAL HIGH (ref 70–99)

## 2012-10-31 LAB — COMPREHENSIVE METABOLIC PANEL
Albumin: 2.6 g/dL — ABNORMAL LOW (ref 3.5–5.2)
Alkaline Phosphatase: 51 U/L (ref 39–117)
BUN: 7 mg/dL (ref 6–23)
Chloride: 101 mEq/L (ref 96–112)
Glucose, Bld: 231 mg/dL — ABNORMAL HIGH (ref 70–99)
Potassium: 3.3 mEq/L — ABNORMAL LOW (ref 3.5–5.1)
Total Bilirubin: 0.5 mg/dL (ref 0.3–1.2)

## 2012-10-31 MED ORDER — DORZOLAMIDE HCL-TIMOLOL MAL 2-0.5 % OP SOLN
1.0000 [drp] | Freq: Two times a day (BID) | OPHTHALMIC | Status: DC
  Filled 2012-10-31: qty 10

## 2012-10-31 MED ORDER — TRAVOPROST (BAK FREE) 0.004 % OP SOLN: 1.0000 [drp] | Freq: Every day | OPHTHALMIC | Status: DC

## 2012-10-31 MED ORDER — TRAMADOL HCL 50 MG PO TABS
50.0000 mg | ORAL_TABLET | Freq: Two times a day (BID) | ORAL | Status: DC | PRN
  Administered 2012-10-31 – 2012-11-07 (×5): 50 mg via ORAL
  Filled 2012-10-31 (×5): qty 1

## 2012-10-31 MED ORDER — BRIMONIDINE TARTRATE 0.2 % OP SOLN
1.0000 [drp] | Freq: Three times a day (TID) | OPHTHALMIC | Status: DC
  Administered 2012-10-31 – 2012-11-15 (×45): 1 [drp] via OPHTHALMIC
  Filled 2012-10-31: qty 5

## 2012-10-31 MED ORDER — OXYCODONE HCL 5 MG PO TABS
5.0000 mg | ORAL_TABLET | ORAL | Status: DC | PRN
  Filled 2012-10-31: qty 1

## 2012-10-31 MED ORDER — GLIPIZIDE 10 MG PO TABS
10.0000 mg | ORAL_TABLET | Freq: Two times a day (BID) | ORAL | Status: DC
  Administered 2012-10-31 – 2012-11-07 (×14): 10 mg via ORAL
  Filled 2012-10-31 (×17): qty 1

## 2012-10-31 MED ORDER — TRAZODONE HCL 50 MG PO TABS
50.0000 mg | ORAL_TABLET | Freq: Every evening | ORAL | Status: DC | PRN
  Administered 2012-10-31 – 2012-11-04 (×5): 50 mg via ORAL
  Filled 2012-10-31 (×5): qty 1

## 2012-10-31 MED ORDER — OXYCODONE HCL 5 MG PO TABS
5.0000 mg | ORAL_TABLET | ORAL | Status: DC | PRN
  Administered 2012-10-31: 5 mg via ORAL

## 2012-10-31 MED ORDER — LATANOPROST 0.005 % OP SOLN
1.0000 [drp] | Freq: Every day | OPHTHALMIC | Status: DC
  Administered 2012-10-31 – 2012-11-14 (×15): 1 [drp] via OPHTHALMIC
  Filled 2012-10-31 (×2): qty 2.5

## 2012-10-31 MED ORDER — OXYCODONE HCL 5 MG PO TABS
5.0000 mg | ORAL_TABLET | ORAL | Status: DC | PRN
  Administered 2012-10-31 – 2012-11-04 (×5): 10 mg via ORAL
  Administered 2012-11-04: 5 mg via ORAL
  Administered 2012-11-14: 10 mg via ORAL
  Administered 2012-11-14 – 2012-11-15 (×2): 5 mg via ORAL
  Administered 2012-11-15: 10 mg via ORAL
  Filled 2012-10-31: qty 1
  Filled 2012-10-31 (×6): qty 2
  Filled 2012-10-31: qty 1
  Filled 2012-10-31 (×3): qty 2

## 2012-10-31 MED ORDER — DORZOLAMIDE HCL-TIMOLOL MAL 2-0.5 % OP SOLN
1.0000 [drp] | Freq: Two times a day (BID) | OPHTHALMIC | Status: DC
  Administered 2012-10-31 – 2012-11-15 (×31): 1 [drp] via OPHTHALMIC

## 2012-10-31 NOTE — Progress Notes (Signed)
Clinical/Bedside Swallow Evaluation Patient Details  Name: GERMAN MANKE MRN: [id number] DOB: 09-04-1934  Today's Date: 10/31/2012 Time: 4098-1191 Time Calculation (min): 45 min  Skilled Therapeutic Intervention: Administered BSE. Please see below for details. Recommend Dys. 1 textures with honey thick liquids. Pt would benefit from objective swallow study to assess possible pharyngeal impairments. Pt educated on clinical reasoning for MBSS and verbalized understanding and agreement.   Past Medical History:  Past Medical History  Diagnosis Date  . Hypertension   . Diabetes mellitus    Past Surgical History:  Past Surgical History  Procedure Laterality Date  . Heart stents  x3  . Cardiac surgery    . Hernia repair    . Rotator cuff repair    . Craniectomy N/A 10/25/2012    Procedure: Suboccipital Craniectomy for Evacuation of Cerebellar Hematoma;  Surgeon: Maeola Harman, MD;  Location: [facility];  Service: Neurosurgery;  Laterality: N/A;  Suboccipital Craniectomy for Evacuation of Cerebellar Hematoma     Assessment/Recommendations/Treatment Plan   SLP Assessment Risk for Aspiration: Mild Other Related Risk Factors: Cognitive impairment;Lethargy  Swallow Evaluation Recommendations Postural Changes and/or Swallow Maneuvers: Seated upright 90 degrees Oral Care Recommendations: Oral care BID   General  Date of Onset: 10/24/12 Previous Swallow Assessment: BSE, pt recommend Dys. 3 textures with nectar-thick liquids but was then downgraded to Dys. 2 with honey thick liquids Diet Prior to this Study: Dysphagia 2 (chopped);Honey-thick liquids Temperature Spikes Noted: No Respiratory Status: Room air Behavior/Cognition: Alert;Cooperative;Requires cueing Oral Cavity - Dentition: Missing dentition Self-Feeding Abilities: Able to feed self;Needs assist Patient Positioning: Upright in chair Baseline Vocal Quality: Clear;Low vocal intensity Volitional Cough: Strong Volitional  Swallow: Able to elicit  Oral Motor/Sensory Function  Overall Oral Motor/Sensory Function: Impaired Labial ROM: Within Functional Limits Labial Symmetry: Within Functional Limits Labial Strength: Within Functional Limits Labial Sensation: Within Functional Limits Lingual Symmetry: Within Functional Limits Lingual Strength: Reduced Lingual Sensation: Reduced Facial ROM: Within Functional Limits Facial Symmetry: Within Functional Limits Facial Strength: Within Functional Limits Facial Sensation: Within Functional Limits  Consistency Results  Ice Chips Ice chips: Not tested  Thin Liquid Thin Liquid: Not tested  Nectar Thick Liquid Nectar Thick Liquid: Not tested  Honey Thick Liquid Honey Thick Liquid: Impaired Presentation: Cup Pharyngeal Phase Impairments: Suspected delayed Swallow;Cough - Delayed  Puree Puree: Within functional limits Presentation: Self Fed;Spoon  Solid Solid: Impaired Presentation: Self Fed;Spoon Oral Phase Impairments: Impaired anterior to posterior transit (prolonged mastication) Pharyngeal Phase Impairments: Cough - Delayed   Tiaja Hagan 10/31/2012,4:13 PM

## 2012-10-31 NOTE — Progress Notes (Signed)
Physical Therapy Note  [name] MRN: [id number] Date of Birth: [birth year] Today's Date: 10/31/2012  Time: 800-856 56 minutes  Pt c/o head and back pain, RN made aware.  NMR with focus on anterior wt shifts and trunk/LE control.  Pt requires mod A for anterior wt shifts and eccentric control.  Standing squat and reach task with mod A, pt with difficulty bending forward due to back pain.  Problem solved compensations for reaching items off of floor, pt reports he has had trouble with back pain x 3 years.  Gait training with shopping cart with focus on grading and controlled movements with improved performance with min-mod A.  W/c mobility throughout unit with supervision, frequent rests due to increased pain and fatigue today.  Pt with noted decreased awareness of deficits and impaired safety awareness noted this session.  Individual therapy   Vassie Kugel 10/31/2012, 8:56 AM

## 2012-10-31 NOTE — Progress Notes (Signed)
Patient information reviewed and entered into eRehab system by Lavayah Vita, RN, CRRN, PPS Coordinator.  Information including medical coding and functional independence measure will be reviewed and updated through discharge.     Per nursing patient was given "Data Collection Information Summary for Patients in Inpatient Rehabilitation Facilities with attached "Privacy Act Statement-Health Care Records" upon admission.  

## 2012-10-31 NOTE — Progress Notes (Signed)
Inpatient Rehabilitation Center Individual Statement of Services  Patient Name:  Bill Jackson  Date:  10/31/2012  Welcome to the Inpatient Rehabilitation Center.  Our goal is to provide you with an individualized program based on your diagnosis and situation, designed to meet your specific needs.  With this comprehensive rehabilitation program, you will be expected to participate in at least 3 hours of rehabilitation therapies Monday-Friday, with modified therapy programming on the weekends.  Your rehabilitation program will include the following services:  Physical Therapy (PT), Occupational Therapy (OT), Speech Therapy (ST), 24 hour per day rehabilitation nursing, Therapeutic Recreaction (TR), Neuropsychology, Case Management (Social Worker), Rehabilitation Medicine, Nutrition Services and Pharmacy Services  Weekly team conferences will be held on Tuesdays to discuss your progress.  Your Social Worker will talk with you frequently to get your input and to update you on team discussions.  Team conferences with you and your family in attendance may also be held.  Expected length of stay: 2 weeks  Overall anticipated outcome: supervision  Depending on your progress and recovery, your program may change. Your Social Worker will coordinate services and will keep you informed of any changes. Your Child psychotherapist names and contact numbers are listed  below.  The following services may also be recommended but are not provided by the Inpatient Rehabilitation Center:   Driving Evaluations  Home Health Rehabiltiation Services  Outpatient Rehabilitatation Servives    Arrangements will be made to provide these services after discharge if needed.  Arrangements include referral to agencies that provide these services.  Your insurance has been verified to be:  [employer] and [employer] Your primary doctor is:  [facility] ([place]  Pertinent information will be shared with your  doctor and your insurance company.  Social Worker:  [place], [place] [phone] or ([phone]  Information discussed with and copy given to patient by: Bill Jackson, 10/31/2012, 3:14 PM

## 2012-10-31 NOTE — Progress Notes (Signed)
Physical Therapy Note  [name] MRN: [id number] Date of Birth: 03/14/1935 Today's Date: 10/31/2012  Time: 1430 Pt missed 30 minutes skilled PT due to paying bills with his friend who is unable to reschedule.   Millianna Szymborski 10/31/2012, 2:53 PM

## 2012-10-31 NOTE — Progress Notes (Signed)
Occupational Therapy Session Note  [name] MRN: [id number] Date of Birth: 03/12/1935  Today's Date: 10/31/2012 Time: 1030-1130 Time Calculation (min): 60 min  Short Term Goals: Week 1:  OT Short Term Goal 1 (Week 1): Pt.  will groom self with SBA OT Short Term Goal 2 (Week 1): Pt. will bath self with minimal OT Short Term Goal 3 (Week 1): Pt. will dress LB with minimal assist OT Short Term Goal 4 (Week 1): Pt. will transfer to toilet with moderatel assist OT Short Term Goal 5 (Week 1): Pt. will perform pericare with minimal assist  Skilled Therapeutic Interventions/Progress Updates:    1:1 self care retraining at shower level in his room with focus on stand pivot transfers, sit to stands, functional use of bilateral UE, sitting and standing dynamic and static balance, anticipatory awareness. Pt with serve pain in back with transfers and any sit to stands; unable to come fully erect (difficulty with left LE hip and knee extension). Although pt able to thread brief and pants by bringing his feet up to his chair. Pt with instability with standing without UE support - requiring max A to remain safe- with UE support min guard.  Therapy Documentation Precautions:  Precautions Precautions: Fall Restrictions Weight Bearing Restrictions: No Pain: Pt c/o serve back pain with mobility (RN and PA made aware) Requested meds See FIM for current functional status  Therapy/Group: Individual Therapy]  Roney Mans [facility] 10/31/2012, 3:29 PM

## 2012-10-31 NOTE — Progress Notes (Signed)
Subjective/Complaints: Some coughing and H/a over weekend. Sometimes gets choked up no food. Slept well. A 12 point review of systems has been performed and if not noted above is otherwise negative.   Objective: Vital Signs: Blood pressure 143/73, pulse 66, temperature 98.3 F (36.8 C), temperature source Oral, resp. rate 19, height 5\' 7"  (1.702 m), weight 96.8 kg (213 lb 6.5 oz), SpO2 98.00%. Dg Chest 2 View  10/29/2012  *RADIOLOGY REPORT*  Clinical Data:  Cough, hypertension, diabetes, smoker  CHEST - 2 VIEW  Comparison: 10/24/2012  Findings: Normal heart size, mediastinal contours, and pulmonary vascularity. Lateral view suggests presence of bibasilar dependent pleural effusion posteriorly with question consolidation at a posterior lung base likely within right lower lobe. Upper lungs clear. No pleural effusion or pneumothorax.  IMPRESSION: Bibasilar effusions with question infiltrate right lower lobe.   Original Report Authenticated By: Ulyses Southward, M.D.    No results found for this basename: WBC, HGB, HCT, PLT,  in the last 72 hours No results found for this basename: NA, K, CL, CO, GLUCOSE, BUN, CREATININE, CALCIUM,  in the last 72 hours CBG (last 3)   Recent Labs  10/30/12 1619 10/30/12 2050 10/31/12 0711  GLUCAP 171* 199* 227*    Wt Readings from Last 3 Encounters:  10/28/12 96.8 kg (213 lb 6.5 oz)  10/25/12 92.1 kg (203 lb 0.7 oz)  10/25/12 92.1 kg (203 lb 0.7 oz)    Physical Exam:  Constitutional: He is oriented to person, place, and time. He appears well-developed and well-nourished.  Tends to keep eyes closed when speaking but will open up. HENT:  Head: Normocephalic and atraumatic.  Right Ear: External ear normal.  Left Ear: External ear normal.  Eyes: Conjunctivae and EOM are normal. Pupils are equal, round, and reactive to light. Right eye exhibits no discharge. Left eye exhibits no discharge. No scleral icterus.  Dysconjugate gaze. Arcus senilis. Pinpoint pupils.   Neck: Neck supple. No JVD present. No thyromegaly present.  Limited due to posterior neck incision.  Cardiovascular: Normal rate and regular rhythm. Exam reveals no gallop and no friction rub.  No murmur heard.  Pulmonary/Chest: Effort normal and breath sounds normal. No respiratory distress. He has no wheezes. He has no rales. He exhibits no tenderness.  Abdominal: Soft. Bowel sounds are normal. He exhibits no distension. There is no tenderness. There is no rebound.  Lymphadenopathy:  He has no cervical adenopathy.  Neurological: He is alert and oriented to person, place, and time.  Moderate to severe dysarthria with occasional stuttering.   Occasional perseverative.   Visual deficits in L>R peripheral fields, dysconjugate gaze. Mild right sided weakness with ataxia RUE and decrease in motor control RLE with ataxia as well.  . Strength remains grossly 3+ to 4/5 in all 4 limbs.  Skin: Skin is warm and dry.    Assessment/Plan: 1. 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 Physiatrist is providing close team supervision and 24 hour management of active medical problems listed below. Physiatrist and rehab team continue to assess barriers to discharge/monitor patient progress toward functional and medical goals. FIM: FIM - Bathing Bathing Steps Patient Completed: Chest;Right Arm;Left Arm;Abdomen;Right upper leg;Left upper leg (patient stood but stated periarea/buttocks washed earlier) Bathing: 4: Min-Patient completes 8-9 38f 10 parts or 75+ percent (elected not to wash lower legs or feet today)  FIM - Upper Body Dressing/Undressing Upper  body dressing/undressing steps patient completed: Thread/unthread left sleeve of pullover shirt/dress;Thread/unthread right sleeve of pullover shirt/dresss;Put head through opening of pull  over shirt/dress;Pull shirt over trunk;Thread/unthread right sleeve of front closure shirt/dress Upper body dressing/undressing: 5: Supervision: Safety issues/verbal cues FIM - Lower Body Dressing/Undressing Lower body dressing/undressing steps patient completed: Thread/unthread right underwear leg;Thread/unthread left underwear leg;Pull underwear up/down;Thread/unthread right pants leg;Thread/unthread left pants leg;Pull pants up/down;Don/Doff right sock;Don/Doff left sock Lower body dressing/undressing: 1: Two helpers (1 person for safety due to patient "wobbly" when standing)  FIM - Toileting Toileting: 0: Activity did not occur  FIM - Archivist Transfers: 1-Two helpers  FIM - Games developer Transfer: 3: Bed > Chair or W/C: Mod A (lift or lower assist)  FIM - Locomotion: Wheelchair Distance: 150' Locomotion: Wheelchair: 5: Travels 150 ft or more: maneuvers on rugs and over door sills with supervision, cueing or coaxing FIM - Locomotion: Ambulation Ambulation/Gait Assistance: 2: Max assist Locomotion: Ambulation: 1: Travels less than 50 ft with moderate assistance (Pt: 50 - 74%)  Comprehension Comprehension Mode: Auditory Comprehension: 3-Understands basic 50 - 74% of the time/requires cueing 25 - 50%  of the time  Expression Expression Mode: Verbal Expression Assistive Devices: 6-Other (Comment) Expression: 3-Expresses basic 50 - 74% of the time/requires cueing 25 - 50% of the time. Needs to repeat parts of sentences.  Social Interaction Social Interaction: 4-Interacts appropriately 75 - 89% of the time - Needs redirection for appropriate language or to initiate interaction.  Problem Solving Problem Solving: 2-Solves basic 25 - 49% of the time - needs direction more than half the time to initiate, plan or complete simple activities  Memory Memory Mode: Not assessed Memory: 2-Recognizes or recalls 25 - 49% of the time/requires cueing 51 - 75% of  the time  Medical Problem List and Plan:  1. DVT Prophylaxis/Anticoagulation: Mechanical: Sequential compression devices, below knee Bilateral lower extremities  2. Headaches: improving. Will continue with prn mediations.  3. Mood: Seems to be in good spirits. Will have LCSW follow for formal evaluation.  -may need some motivation at times however.  4. Neuropsych: This patient is capable of making decisions on his/her own behalf.  5. HTN: Will monitor with BID checks. Starting to trend upwards with increase in activity. May need additional agent. Continue Lotensin. Monitor renal status as on Nectar liquids.  6. DM type 2: Was on 70/30 insulin additionally at home-Hgb A1C @ 6.7. Monitor BS with AC/HS cbg checks. Continue metformin and glipizide--increase glipizide to bid. Will monitor trends to see if insulin needs to be resumed.  7. 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 Continue thiamine.  8. Hypokalemia: Likely dilutional due to ongoing IVF. Will change fluids to bedtime.  9. Dysphagia: Needs supervision for meals. Encourage fluids  LOS (Days) 3 A FACE TO FACE EVALUATION WAS PERFORMED  Aryav Wimberly T 10/31/2012 8:14 AM

## 2012-10-31 NOTE — Progress Notes (Signed)
Social Work  Social Work Assessment and Plan  [name] MRN: [id number] Date of Birth: 1934-12-28  Today's Date: 10/31/2012  Problem List:  Patient Active Problem List  Diagnosis  . Type 2 diabetes mellitus  . Hypertension  . OA (osteoarthritis) of knee-right  . Accelerated hypertension  . Hypokalemia  . Acute cerebellar hemorrhage  . Encounter for long-term (current) use of medications  . Headache  . Cough   Past Medical History:  Past Medical History  Diagnosis Date  . Hypertension   . Diabetes mellitus    Past Surgical History:  Past Surgical History  Procedure Laterality Date  . Heart stents  x3  . Cardiac surgery    . Hernia repair    . Rotator cuff repair    . Craniectomy N/A 10/25/2012    Procedure: Suboccipital Craniectomy for Evacuation of Cerebellar Hematoma;  Surgeon: Bill Harman, MD;  Location: [facility];  Service: Neurosurgery;  Laterality: N/A;  Suboccipital Craniectomy for Evacuation of Cerebellar Hematoma   Social History:  reports that he has been smoking.  He does not have any smokeless tobacco history on file. He reports that  drinks alcohol. He reports that he does not use illicit drugs.  Family / Support Systems Marital Status: Divorced How Long?: 12 yrs Patient Roles: Parent (has two children ) Children: pt has two local sons, however, no contact with either Other Supports: sister, Bill Jackson @ [phone] [place], [place]);  friend, Bill Jackson @ [phone];  friend, Bill Jackson @ [phone] Anticipated Caregiver: Bill Jackson, his wife and friends Ability/Limitations of Caregiver: works day shift Caregiver Availability: 24/7 (to be arranged) Family Dynamics: As noted, pt with no contact with children.  Social History Preferred language: English Religion: Holiness Cultural Background: NA Education: HS Read: Yes Write: Yes Employment Status: Retired Date Retired/Disabled/Unemployed: approx 2 yrs Insurance account manager Issues: None Guardian/Conservator: Bill Jackson - friend - is pt's POA   Abuse/Neglect Physical Abuse: Denies Verbal Abuse: Denies Sexual Abuse: Denies Exploitation of patient/patient's resources: Denies Self-Neglect: Denies  Emotional Status Pt's affect, behavior adn adjustment status: Pt difficult to understand due to speech impairments.  Must ask that he slow down and repeat information, however, answers are correct and he is alert and oriented x4.  Denies any s/s of significant emotional distress - will monitor.  May be beneficial to have neuropsych screen/ eval at some point. Recent Psychosocial Issues: None noted by pt or friends Pyschiatric History: None Substance Abuse History: None  Patient / Family Perceptions, Expectations & Goals Pt/Family understanding of illness & functional limitations: Pt able to report he suffered a stroke and that he has limitations with his speech and walking.  Friend with basic understanding of pt's functional limitations and reality that he will need assistance upon d/c. Premorbid pt/family roles/activities: Pt living independently.  Friends do assist with bill paying, home and yard upkeep and general needs. Anticipated changes in roles/activities/participation: Pt will likely require 24/7 assistance for an indefinite period of time.  Friends to assume the caregiver responsibilities Pt/family expectations/goals: Pt's goal, however, is to be able to return to his own home (friend aware and states they have already agreed on him coming to his home)  [employer]: None Premorbid Home Care/DME Agencies: None Transportation available at discharge: yes Resource referrals recommended: Neuropsychology;Support group (specify)  Discharge Planning Living Arrangements: Alone Support Systems: Friends/neighbors Type of Residence: Private residence Insurance Resources: Administrator  (specify) Financial Resources: Social  Security Financial Screen Referred: No Living Expenses: Database administrator Management: Other (Comment) (POA, Bill Jackson) Do you have any problems obtaining your medications?: No Home Management: pt with assist of friends Patient/Family Preliminary Plans: As noted, upon admit to CIR, plan was noted to be that pt would d/c home with Bill Jackson and his family.  Pt now states his preference is to d/c to his home (concern that this will be burden to friend/ family) - Bill Jackson reports, "he is coming to my house because he has no other option.  that's that" Barriers to Discharge: Self care Social Work Anticipated Follow Up Needs: HH/OP Expected length of stay: ELOS 2 to 3 weeks  Clinical Impression Pleasant, oriented gentleman here after hemorrhage.  Speech is impaired, however, able to complete interview with cues to pt to slow rate.  Pt reports good support from local friends, however, reluctance to burden them with his care.  [name], reports he and his family are fully prepared to have pt stay with them upon d/c.  Will continue to follow for support and d/c planning.  Bill Jackson 10/31/2012, 4:30 PM

## 2012-11-01 ENCOUNTER — Inpatient Hospital Stay (HOSPITAL_COMMUNITY): Payer: Medicare Other

## 2012-11-01 ENCOUNTER — Inpatient Hospital Stay (HOSPITAL_COMMUNITY): Payer: Medicare Other | Admitting: Speech Pathology

## 2012-11-01 ENCOUNTER — Inpatient Hospital Stay (HOSPITAL_COMMUNITY): Payer: Medicare Other | Admitting: Physical Therapy

## 2012-11-01 DIAGNOSIS — I619 Nontraumatic intracerebral hemorrhage, unspecified: Secondary | ICD-10-CM

## 2012-11-01 DIAGNOSIS — I10 Essential (primary) hypertension: Secondary | ICD-10-CM

## 2012-11-01 LAB — GLUCOSE, CAPILLARY
Glucose-Capillary: 154 mg/dL — ABNORMAL HIGH (ref 70–99)
Glucose-Capillary: 130 mg/dL — ABNORMAL HIGH (ref 70–99)

## 2012-11-01 MED ORDER — DICLOFENAC SODIUM 1 % TD GEL
4.0000 g | Freq: Three times a day (TID) | TRANSDERMAL | Status: DC
  Administered 2012-11-01 – 2012-11-15 (×34): 4 g via TOPICAL
  Filled 2012-11-01 (×2): qty 100

## 2012-11-01 NOTE — Progress Notes (Signed)
Occupational Therapy Note  [name] MRN: [id number] Date of Birth: [birth year] Today's Date: 11/01/2012  Time: 1130-1200 Pt c/o 5/10 pain in neck and upper back; RN aware Group Therapy  Pt participated in self feeding group with focus on BUE use for opening containers and self feeding.  Pt used BUE throughout meal for all tasks.   Lavone Neri [facility] 11/01/2012, 1:53 PM

## 2012-11-01 NOTE — Progress Notes (Signed)
Occupational Therapy Note  [name] MRN: [id number] Date of Birth: 1935-02-14 Today's Date: 11/01/2012  Time: 10-11am ( .) Pt seen 1:1 to address re-training BADL's, transfers, activity tolerance and safety during ADL's. Pt sitting in wheelchair with visitor upon arrival. Pt agreed to taking a sink bath today. No c/o pain. Pt used bathroom first, with steadying assist during transfer. Pt completed bathing at sink level sitting and standing with steadying assist. Max A for back only and increased time required for activity. Max A to donn brief and TED hose with pt completing rest of dressing. Pt requested to lay down after grooming activities completed, with min A for transfer from wheelchair to bed. Pt left with bed alarm on and call bell within reach.   Bill Jackson Hessie Diener 11/01/2012, 10:52 AM

## 2012-11-01 NOTE — Progress Notes (Signed)
Physical Therapy Note  [name] MRN: [id number] Date of Birth: Jun 01, 1935 Today's Date: 11/01/2012  Time: 1300-1356 56 minutes  Pt c/o hip and back pain with all mobility.  Pt reports "nothing" makes it feel better.  Supine stretching to B hips and hamstrings with pt with very limited hamstring ROM, c/o pain in hips with HS stretch.  Pt reports "aching" vs burning pain.  LTR for lower back mobility in pain free range.  Standing balance training with lateral/fwd/bkwd stepping with min-mod A due to posterior lean.  Gait with shopping cart with min A, improved trunk control noted.  Gait training with RW with 4# weights applied, pt with decreased UE and trunk ataxia noted, much improved gait control.  Pt min A with RW 2 x 50'.  Pt continues to be limited by pain in back and hips with all mobility.  Individual therapy  DONAWERTH,KAREN 11/01/2012, 1:56 PM

## 2012-11-01 NOTE — Patient Care Conference (Signed)
Inpatient RehabilitationTeam Conference and Plan of Care Update Date: 11/01/2012   Time: 2:45 PM    Patient Name: Bill Jackson      Medical Record Number: [id number]  Date of Birth: [birth year] Sex: Male         Room/Bed: 4010/4010-01 Payor Info: Payor: [employer] [place] [employer]ICARE  Plan: BLUE MEDICARE  Product Type: *No Product type*     Admitting Diagnosis: ICH  Admit Date/Time:  10/28/2012  3:33 PM Admission Comments: No comment available   Primary Diagnosis:  <principal problem not specified> Principal Problem: <principal problem not specified>  Patient Active Problem List   Diagnosis Date Noted  . Headache 10/29/2012  . Cough 10/29/2012  . OA (osteoarthritis) of knee-right 10/28/2012  . Accelerated hypertension 10/28/2012  . Hypokalemia 10/28/2012  . Acute cerebellar hemorrhage 10/28/2012  . Encounter for long-term (current) use of medications 10/28/2012  . Type 2 diabetes mellitus 10/25/2012  . Hypertension 10/25/2012    Expected Discharge Date: Expected Discharge Date: 11/11/12  Team Members Present: Physician leading conference: Dr. Faith Rogue Social Worker Present: Amada Jupiter, LCSW Nurse Present: Carmie End, RN PT Present: Reggy Eye, PT OT Present: Ardis Rowan, Corky Crafts, OT SLP Present: Feliberto Gottron, SLP Other (Discipline and Name): Bayard Hugger, RN and Tora Duck, PPS Coordinator     Current Status/Progress Goal Weekly Team Focus  Medical   cerbellar hemorrhage with ataxia, dysarthria,   stabilize medical issues for discharge, pain control  pain control, advancing swallowing   Bowel/Bladder   Continent of bowel and bladder; uses urinal; on scheduked senekot bid  Remain continent of bowel and bladder until discharge  Monitor for continence, constipation, or diarrhea   Swallow/Nutrition/ Hydration   Dys. 2 textures with nectar-thick liquids, Mod A for utilization of strategies  Supervision  increased utilization of swallowing  compensatory strategies,    ADL's   supervision for UB, mod A for LB, sit to stands wtih min to mod, standing balance with mod to max A dyanamically, transfers min to mod   supervision overall  pain management in therapy/ with mobility, standing balance with and without use of UEs, toileting   Mobility   min A  supervision  balance, coordination, gait   Communication   Mod-Max A  Min A  utilization of speech intelligibility strategies   Safety/Cognition/ Behavioral Observations  Mod-Max A  Min A  attention, awareness, problem solving    Pain   Complaints of headache with coughing and back pain; on oxycodone 5-10 mg PRN and tramadol 50 mg PRN  Keep pain level < 3 on scale of 0-10  Reassess for affectiveness of PRN pain medications   Skin   Staples to L scalp OTA,clean, dry, intact: Sutures to posterior head/neck with dry dressing, clean, dry, intact  Incision heals with no infection, continue dressing changes  Keep incision clean and dry    Rehab Goals Patient on target to meet rehab goals: Yes *See Care Plan and progress notes for long and short-term goals.  Barriers to Discharge: ataxia, impulsivity    Possible Resolutions to Barriers:  supervision at home. pain control, family ed, adaptive education    Discharge Planning/Teaching Needs:  Plan is for patient to d/c to friend's home [name]) where 24/7 supervision can be provided.  Friend waiting to hear target d/c date and will then proceed with setting up caregiver schedule.      Team Discussion:  MD aware of pt's pain issues - may need some  med changes.  Very ataxic and backward leaning, however hopeful to reach supervision goals.  Revisions to Treatment Plan:  None   Continued Need for Acute Rehabilitation Level of Care: The patient requires daily medical management by a physician with specialized training in physical medicine and rehabilitation for the following conditions: Daily direction of a multidisciplinary  physical rehabilitation program to ensure safe treatment while eliciting the highest outcome that is of practical value to the patient.: Yes Daily analysis of laboratory values and/or radiology reports with any subsequent need for medication adjustment of medical intervention for : Other (pain mgt, )  Bill Jackson 11/01/2012, 4:56 PM

## 2012-11-01 NOTE — Procedures (Signed)
Objective Swallowing Evaluation: Modified Barium Swallowing Study  [name] MRN: [id number] Date of Birth: 11-06-1934  Today's Date: 11/01/2012 Time: 7829-5621 Time Calculation (min): 30 min  Past Medical History:  Past Medical History  Diagnosis Date  . Hypertension   . Diabetes mellitus    Past Surgical History:  Past Surgical History  Procedure Laterality Date  . Heart stents  x3  . Cardiac surgery    . Hernia repair    . Rotator cuff repair    . Craniectomy N/A 10/25/2012    Procedure: Suboccipital Craniectomy for Evacuation of Cerebellar Hematoma;  Surgeon: Maeola Harman, MD;  Location: [facility];  Service: Neurosurgery;  Laterality: N/A;  Suboccipital Craniectomy for Evacuation of Cerebellar Hematoma   HPI: Pt is a 78 y.o. male history of hypertension and diabetes mellitus as well as alcohol abuse, presenting with acute onset of nausea and vomiting and complained of dizziness with ataxia 3/24. CT scan of the head showed a 5 x 3.5 cm acute cerebellar hemorrhage with mild mass effect on fourth ventricle. Pt had a suboccipital craniectomy for evacuation of cerebellar hematoma 3/25. Pt had BSE on 10/26/12 and was recommend Dys. 3 textures with nectar thick liquids, however, pt demonstrated difficulty tolerating diet and was ultimately downgraded to Dys. 1 textures with honey-thick liquids. MBSS today to assess for best diet recommendation and to minimize aspiration risk.    Recommendation/Prognosis  Clinical Impression Dysphagia Diagnosis: Mild oral phase dysphagia;Mild pharyngeal phase dysphagia;Mild cervical esophageal phase dysphagia Clinical impression: Pt demonstrates a mild oral-pharyngeal dysphagia characterized by decreased mastication and manipulation with mechanical soft textures and delayed swallow initiation to the pyiform sinuses with thin liquids resulting in flash penetration. Intermittent penetration was also noted from pyiform sinus residue  that cleared with a cued cough/throat clear.  Pt demonstrates mild pyiform sinus, valleculae and CP segment residue that was reduced with multiple swallows.  Pt also had one episode of reflux during the study. Recommend Dys. 2 textures with nectar-thick liquids with strict swallowing compensatory strategies to decrease aspiration risk due to pt's recent difficulty tolerating current diet which I suspect is impacted by his reflux. MD aware.   Swallow Evaluation Recommendations Diet Recommendations: Dysphagia 2 (Fine chop);Nectar-thick liquid Liquid Administration via: Cup Medication Administration: Whole meds with puree Supervision: Patient able to self feed;Full supervision/cueing for compensatory strategies Compensations: Slow rate;Small sips/bites;Multiple dry swallows after each bite/sip;Clear throat intermittently;Follow solids with liquid Postural Changes and/or Swallow Maneuvers: Upright 30-60 min after meal;Seated upright 90 degrees Oral Care Recommendations: Oral care BID Other Recommendations: Prohibited food (jello, ice cream, thin soups);Remove water pitcher;Order thickener from pharmacy Follow up Recommendations: 24 hour supervision/assistance Prognosis Prognosis for Safe Diet Advancement: Fair Barriers to Reach Goals: Cognitive deficits;Motivation Individuals Consulted Consulted and Agree with Results and Recommendations: Patient;RN;MD  SLP Assessment/Plan Dysphagia Diagnosis: Mild oral phase dysphagia;Mild pharyngeal phase dysphagia;Mild cervical esophageal phase dysphagia Clinical impression: Pt demonstrates a mild oral-pharyngeal dysphagia characterized by decreased mastication and manipulation with mechanical soft textures and delayed swallow initiation to the pyiform sinuses with thin liquids resulting in flash penetration. Intermittent penetration was also noted from pyiform sinus residue that cleared with a cued cough/throat clear.  Pt demonstrates mild pyiform sinus,  valleculae and CP segment residue that was reduced with multiple swallows.  Pt also had one episode of reflux during the study. Recommend Dys. 2 textures with nectar-thick liquids with strict swallowing compensatory strategies to decrease aspiration risk due to pt's recent difficulty tolerating current diet  which I suspect is impacted by his reflux. MD aware.    Short Term Goals: Week 1: SLP Short Term Goal 1 (Week 1): Pt will utilize swallowing compensatory strategies to minimize overt s/s of aspiration with Min A verbal cues.  SLP Short Term Goal 2 (Week 1): Patient will select attention to basic and familiar tasks with minimum miultimodal cues.  SLP Short Term Goal 3 (Week 1): Patient will demonstrate mildly complex problem solving abilities during functional tasks with minimal verbal cues.  SLP Short Term Goal 4 (Week 1): Patient will utilize external memory aids to faciliate retrieval of basic daily information with minimal verbal cues. SLP Short Term Goal 5 (Week 1): Patient will utilize compensatory strategies to increase intelligible speech, with minimal verbal and visual cues.   SLP Short Term Goal 6 (Week 1): pt will consume trials of thin liquids without overt s/s of aspiration with supervision verbal cues with 100% of trials.   General:  Date of Onset: 11/24/12 Type of Study: Modified Barium Swallowing Study Reason for Referral: Objectively evaluate swallowing function Previous Swallow Assessment: BSE on 10/31/12 and recommended Dys. 1 textures with honey-thick liquids Diet Prior to this Study: Honey-thick liquids;Dysphagia 1 (puree) Temperature Spikes Noted: No Respiratory Status: Room air Behavior/Cognition: Alert;Cooperative Oral Cavity - Dentition: Missing dentition Oral Motor / Sensory Function: Impaired - see Bedside swallow eval Self-Feeding Abilities: Able to feed self;Needs assist Patient Positioning: Upright in chair Baseline Vocal Quality: Clear Volitional Cough:  Strong Volitional Swallow: Able to elicit Pharyngeal Secretions: Not observed secondary MBS  Reason for Referral:  Objectively evaluate swallowing function   Oral Phase Oral Preparation/Oral Phase Oral Phase: Impaired Oral - Solids Oral - Mechanical Soft: Delayed oral transit;Impaired mastication;Weak lingual manipulation Pharyngeal Phase  Pharyngeal Phase Pharyngeal Phase: Impaired Pharyngeal - Nectar Pharyngeal - Nectar Teaspoon: Delayed swallow initiation (to valleculae) Pharyngeal - Nectar Cup: Delayed swallow initiation (to pyiform sinuses) Pharyngeal - Thin Pharyngeal - Thin Teaspoon: Delayed swallow initiation;Penetration/Aspiration during swallow;Pharyngeal residue - pyriform sinuses;Pharyngeal residue - cp segment Penetration/Aspiration details (thin teaspoon): Material enters airway, remains ABOVE vocal cords then ejected out Pharyngeal - Thin Cup: Penetration/Aspiration during swallow;Pharyngeal residue - cp segment;Pharyngeal residue - pyriform sinuses;Delayed swallow initiation Penetration/Aspiration details (thin cup): Material enters airway, remains ABOVE vocal cords and not ejected out Pharyngeal - Solids Pharyngeal - Puree: Delayed swallow initiation;Pharyngeal residue - cp segment;Pharyngeal residue - valleculae Pharyngeal - Mechanical Soft: Delayed swallow initiation;Pharyngeal residue - pyriform sinuses;Pharyngeal residue - cp segment;Pharyngeal residue - valleculae Cervical Esophageal Phase  Cervical Esophageal Phase Cervical Esophageal Phase: Impaired Cervical Esophageal Phase - Comment Cervical Esophageal Comment: Pt belched during study and food contents were refluxed back up through his cp segments, but swallowed back down.   Wyat Infinger 11/01/2012, 3:46 PM

## 2012-11-01 NOTE — Progress Notes (Signed)
Subjective/Complaints: Complained of bilateral hip pain to me this am. Therapy reports back pain with therapies. A 12 point review of systems has been performed and if not noted above is otherwise negative.   Objective: Vital Signs: Blood pressure 123/62, pulse 52, temperature 97.9 F (36.6 C), temperature source Oral, resp. rate 18, height 5\' 7"  (1.702 m), weight 96.8 kg (213 lb 6.5 oz), SpO2 98.00%. Dg Lumbar Spine 2-3 Views  10/31/2012  *RADIOLOGY REPORT*  Clinical Data: 78 year old male with posterior low back pain and stiffness.  LUMBAR SPINE - 2-3 VIEW  Comparison: 08/20/2009.  Findings: Normal lumbar segmentation.  Stable lumbar vertebral height and alignment.  Stable relatively preserved disc spaces. Extensive calcified atherosclerosis of the aorta and iliac arteries.  Stable sacral ala and SI joints.  IMPRESSION: 1. No acute osseous abnormality in the lumbar spine. 2.  Chronic calcified atherosclerosis.   Original Report Authenticated By: Erskine Speed, M.D.     Recent Labs  10/31/12 0500  WBC 5.8  HGB 13.8  HCT 39.1  PLT 311    Recent Labs  10/31/12 0500  NA 138  K 3.3*  CL 101  GLUCOSE 231*  BUN 7  CREATININE 1.09  CALCIUM 8.6   CBG (last 3)   Recent Labs  10/31/12 1634 10/31/12 2113 11/01/12 0712  GLUCAP 165* 201* 193*    Wt Readings from Last 3 Encounters:  10/28/12 96.8 kg (213 lb 6.5 oz)  10/25/12 92.1 kg (203 lb 0.7 oz)  10/25/12 92.1 kg (203 lb 0.7 oz)    Physical Exam:  Constitutional: He is oriented to person, place, and time. He appears well-developed and well-nourished.  Tends to keep eyes closed when speaking but will open up. HENT:  Head: Normocephalic and atraumatic.  Right Ear: External ear normal.  Left Ear: External ear normal.  Eyes: Conjunctivae and EOM are normal. Pupils are equal, round, and reactive to light. Right eye exhibits no discharge. Left eye exhibits no discharge. No scleral icterus.  Dysconjugate gaze. Arcus senilis.  Pinpoint pupils.  Neck: Neck supple. No JVD present. No thyromegaly present.  Limited due to posterior neck incision.  Cardiovascular: Normal rate and regular rhythm. Exam reveals no gallop and no friction rub.  No murmur heard.  Pulmonary/Chest: Effort normal and breath sounds normal. No respiratory distress. He has no wheezes. He has no rales. He exhibits no tenderness.  Abdominal: Soft. Bowel sounds are normal. He exhibits no distension. There is no tenderness. There is no rebound.  Lymphadenopathy:  He has no cervical adenopathy.  Neurological: He is alert and oriented to person, place, and time.  Moderate to severe dysarthria with occasional stuttering.   Occasional perseverative.   Visual deficits in L>R peripheral fields, dysconjugate gaze. Mild right sided weakness with ataxia RUE and decrease in motor control RLE with ataxia as well.  . Strength remains grossly 3+ to 4/5 in all 4 limbs.  Skin: Skin is warm and dry.  Musculoskeletal: pain in both greater trochs and surrounding ER's with palpation/pressure   Assessment/Plan: 1. 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 Physiatrist is providing close team supervision and 24 hour management of active medical problems listed below. Physiatrist and rehab team continue to assess barriers to discharge/monitor patient progress toward functional and medical goals. FIM: FIM - Bathing Bathing Steps Patient Completed: Chest;Right Arm;Left Arm;Abdomen;Front perineal area;Right upper leg;Left upper leg Bathing:  3: Mod-Patient completes 5-7 31f 10 parts or 50-74%  FIM - Upper Body Dressing/Undressing Upper body dressing/undressing steps patient completed: Thread/unthread left sleeve of pullover shirt/dress;Thread/unthread right sleeve of pullover shirt/dresss;Put head through  opening of pull over shirt/dress;Pull shirt over trunk Upper body dressing/undressing: 5: Supervision: Safety issues/verbal cues FIM - Lower Body Dressing/Undressing Lower body dressing/undressing steps patient completed: Thread/unthread right underwear leg;Thread/unthread left underwear leg;Thread/unthread right pants leg;Thread/unthread left pants leg;Don/Doff right sock;Don/Doff left sock;Don/Doff right shoe;Don/Doff left shoe Lower body dressing/undressing: 4: Min-Patient completed 75 plus % of tasks  FIM - Toileting Toileting: 0: Activity did not occur  FIM - Diplomatic Services operational officer Devices: Psychiatrist Transfers: 1-Two helpers  FIM - Games developer Transfer: 4: Chair or W/C > Bed: Min A (steadying Pt. > 75%);4: Bed > Chair or W/C: Min A (steadying Pt. > 75%)  FIM - Locomotion: Wheelchair Distance: 150' Locomotion: Wheelchair: 5: Travels 150 ft or more: maneuvers on rugs and over door sills with supervision, cueing or coaxing FIM - Locomotion: Ambulation Ambulation/Gait Assistance: 2: Max assist Locomotion: Ambulation: 1: Travels less than 50 ft with minimal assistance (Pt.>75%)  Comprehension Comprehension Mode: Auditory Comprehension: 3-Understands basic 50 - 74% of the time/requires cueing 25 - 50%  of the time  Expression Expression Mode: Verbal Expression Assistive Devices: 6-Other (Comment) Expression: 3-Expresses basic 50 - 74% of the time/requires cueing 25 - 50% of the time. Needs to repeat parts of sentences.  Social Interaction Social Interaction: 4-Interacts appropriately 75 - 89% of the time - Needs redirection for appropriate language or to initiate interaction.  Problem Solving Problem Solving: 2-Solves basic 25 - 49% of the time - needs direction more than half the time to initiate, plan or complete simple activities  Memory Memory Mode: Not assessed Memory: 2-Recognizes or recalls 25 - 49% of the time/requires  cueing 51 - 75% of the time  Medical Problem List and Plan:  1. DVT Prophylaxis/Anticoagulation: Mechanical: Sequential compression devices, below knee Bilateral lower extremities  2. Headaches/pain: complained of back pain by report yesterday. Complained of hip pain to me this am  -appears to have trochanteric bursitis  -lumbar xrays unremarkable  -ice/voltaren gel. Work on ROM, avoid sleeping on sides 3. Mood: Seems to be in good spirits. Will have LCSW follow for formal evaluation.  -may need some motivation at times however.  4. Neuropsych: This patient is capable of making decisions on his/her own behalf.  5. HTN: Will monitor with BID checks. Starting to trend upwards with increase in activity. May need additional agent. Continue Lotensin. Monitor renal status as on Nectar liquids.  6. DM type 2: Was on 70/30 insulin additionally at home-Hgb A1C @ 6.7. Monitor BS with AC/HS cbg checks. Continue metformin and glipizide--increased glipizide to bid. May need to resume insulin 7. 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 Continue thiamine.  8. Hypokalemia: Likely dilutional due to ongoing IVF. Will change fluids to bedtime.  9. Dysphagia: Needs supervision for meals. Encourage fluids  LOS (Days) 4 A FACE TO FACE EVALUATION WAS PERFORMED  SWARTZ,ZACHARY T 11/01/2012 7:47 AM

## 2012-11-02 ENCOUNTER — Inpatient Hospital Stay (HOSPITAL_COMMUNITY): Payer: BC Managed Care – PPO

## 2012-11-02 ENCOUNTER — Inpatient Hospital Stay (HOSPITAL_COMMUNITY): Payer: Medicare Other | Admitting: Physical Therapy

## 2012-11-02 ENCOUNTER — Encounter (HOSPITAL_COMMUNITY): Payer: BC Managed Care – PPO | Admitting: *Deleted

## 2012-11-02 ENCOUNTER — Inpatient Hospital Stay (HOSPITAL_COMMUNITY): Payer: Medicare Other | Admitting: *Deleted

## 2012-11-02 ENCOUNTER — Inpatient Hospital Stay (HOSPITAL_COMMUNITY): Payer: Medicare Other | Admitting: Occupational Therapy

## 2012-11-02 ENCOUNTER — Inpatient Hospital Stay (HOSPITAL_COMMUNITY): Payer: Medicare Other

## 2012-11-02 DIAGNOSIS — I619 Nontraumatic intracerebral hemorrhage, unspecified: Secondary | ICD-10-CM

## 2012-11-02 DIAGNOSIS — I10 Essential (primary) hypertension: Secondary | ICD-10-CM

## 2012-11-02 LAB — GLUCOSE, CAPILLARY
Glucose-Capillary: 151 mg/dL — ABNORMAL HIGH (ref 70–99)
Glucose-Capillary: 144 mg/dL — ABNORMAL HIGH (ref 70–99)
Glucose-Capillary: 155 mg/dL — ABNORMAL HIGH (ref 70–99)

## 2012-11-02 NOTE — Evaluation (Signed)
Recreational Therapy Assessment and Plan  [name] MRN: [id number] Date of Birth: Feb 22, 1935 Today's Date: 11/02/2012  Rehab Potential: Good ELOS: 2 weeks   Assessment Clinical Impression: Problem List:  Patient Active Problem List   Diagnosis   .  Type 2 diabetes mellitus   .  Hypertension   .  OA (osteoarthritis) of knee-right   .  Accelerated hypertension   .  Hypokalemia   .  Acute cerebellar hemorrhage   .  Encounter for long-term (current) use of medications   .  Headache   .  Cough    Past Medical History:  Past Medical History   Diagnosis  Date   .  Hypertension    .  Diabetes mellitus     Past Surgical History:  Past Surgical History   Procedure  Laterality  Date   .  Heart stents   x3   .  Cardiac surgery     .  Hernia repair     .  Rotator cuff repair     .  Craniectomy  N/A  10/25/2012     Procedure: Suboccipital Craniectomy for Evacuation of Cerebellar Hematoma; Surgeon: Maeola Harman, MD; Location: [facility]; Service: Neurosurgery; Laterality: N/A; Suboccipital Craniectomy for Evacuation of Cerebellar Hematoma    Assessment & Plan  Clinical Impression: Patient is a 78 y.o. year old male with recent admission to the hospital on 10/25/12 with acute onset of nausea and vomiting, dizziness as well as ataxia. CT scan of the head showed a 5 x 3.5 cm acute cerebellar hemorrhage with mild mass effect on fourth ventricle. Fourth ventricle is patent and patient had no signs of hydrocephalus. He was evaluated by Dr. [name] and underwent emergent suboccipital craniectomy for evacuation of hematoma that pm. Neurology felt that patient with hemorrhage due to accelerated HTN. Patient with resultant dysarthria, weak cough, dysphagia, significant dysmetria RUE and significant visual deficits. Patient transferred to CIR on 10/28/2012 .   Pt presents with decreased activity tolerance, decreased functional mobility, decreased balance, decreased  coordination, decreased problem solving, decreased safety Limiting pt's independence with leisure/community pursuits.  Leisure History/Participation Premorbid leisure interest/current participation: Games - Cards;Nature - Lawn care;Sports - Other (Comment);Community - Journalist, newspaper - Press photographer - Travel (Comment) (football) Other Leisure Interests: Television Leisure Participation Style: Alone;With Family/Friends Awareness of Community Resources: Good-identify 3 post discharge leisure resources Psychosocial / Spiritual Patient agreeable to Pet Therapy: Yes Does patient have pets?: Yes Social interaction - Mood/Behavior: Cooperative Film/video editor for Education?: Yes Strengths/Weaknesses Patient Strengths/Abilities: Willingness to participate Patient weaknesses: Physical limitations  Plan Rec Therapy Plan Is patient appropriate for Therapeutic Recreation?: Yes Rehab Potential: Good Treatment times per week: Min 1 time per week >20 minutes Estimated Length of Stay: 2 weeks TR Treatment/Interventions: Adaptive equipment instruction;1:1 session;Balance/vestibular training;Functional mobility training;Community reintegration;Patient/family education;Therapeutic activities;Recreation/leisure participation;Therapeutic exercise;UE/LE Coordination activities  Recommendations for other services: None  Discharge Criteria: Patient will be discharged from TR if patient refuses treatment 3 consecutive times without medical reason.  If treatment goals not met, if there is a change in medical status, if patient makes no progress towards goals or if patient is discharged from hospital.  The above assessment, treatment plan, treatment alternatives and goals were discussed and mutually agreed upon: by patient  Bill Jackson 11/02/2012, 9:52 AM

## 2012-11-02 NOTE — Progress Notes (Signed)
Subjective/Complaints: Hip pain better this am. Able to sleep fairly well A 12 point review of systems has been performed and if not noted above is otherwise negative.   Objective: Vital Signs: Blood pressure 124/70, pulse 49, temperature 98.2 F (36.8 C), temperature source Oral, resp. rate 19, height 5\' 7"  (1.702 m), weight 96.8 kg (213 lb 6.5 oz), SpO2 98.00%. Dg Lumbar Spine 2-3 Views  10/31/2012  *RADIOLOGY REPORT*  Clinical Data: 78 year old male with posterior low back pain and stiffness.  LUMBAR SPINE - 2-3 VIEW  Comparison: 08/20/2009.  Findings: Normal lumbar segmentation.  Stable lumbar vertebral height and alignment.  Stable relatively preserved disc spaces. Extensive calcified atherosclerosis of the aorta and iliac arteries.  Stable sacral ala and SI joints.  IMPRESSION: 1. No acute osseous abnormality in the lumbar spine. 2.  Chronic calcified atherosclerosis.   Original Report Authenticated By: Erskine Speed, M.D.    Dg Swallowing Func-speech Pathology  11/01/2012  Huston Foley, CCC-SLP     11/01/2012  3:49 PM Objective Swallowing Evaluation: Modified Barium Swallowing Study   [name] MRN: [id number] Date of Birth: July 21, 1935  Today's Date: 11/01/2012 Time: 4098-1191 Time Calculation (min): 30 min  Past Medical History:  Past Medical History  Diagnosis Date  . Hypertension   . Diabetes mellitus    Past Surgical History:  Past Surgical History  Procedure Laterality Date  . Heart stents  x3  . Cardiac surgery    . Hernia repair    . Rotator cuff repair    . Craniectomy N/A 10/25/2012    Procedure: Suboccipital Craniectomy for Evacuation of  Cerebellar Hematoma;  Surgeon: Maeola Harman, MD;  Location: [facility];  Service: Neurosurgery;  Laterality: N/A;   Suboccipital Craniectomy for Evacuation of Cerebellar Hematoma   HPI: Pt is a 78 y.o. male history of hypertension and diabetes  mellitus as well as alcohol abuse, presenting with acute onset of  nausea and  vomiting and complained of dizziness with ataxia 3/24.  CT scan of the head showed a 5 x 3.5 cm acute cerebellar  hemorrhage with mild mass effect on fourth ventricle. Pt had a  suboccipital craniectomy for evacuation of cerebellar hematoma  3/25. Pt had BSE on 10/26/12 and was recommend Dys. 3 textures  with nectar thick liquids, however, pt demonstrated difficulty  tolerating diet and was ultimately downgraded to Dys. 1 textures  with honey-thick liquids. MBSS today to assess for best diet  recommendation and to minimize aspiration risk.    Recommendation/Prognosis  Clinical Impression Dysphagia Diagnosis: Mild oral phase dysphagia;Mild pharyngeal  phase dysphagia;Mild cervical esophageal phase dysphagia Clinical impression: Pt demonstrates a mild oral-pharyngeal  dysphagia characterized by decreased mastication and manipulation  with mechanical soft textures and delayed swallow initiation to  the pyiform sinuses with thin liquids resulting in flash  penetration. Intermittent penetration was also noted from pyiform  sinus residue that cleared with a cued cough/throat clear.  Pt  demonstrates mild pyiform sinus, valleculae and CP segment  residue that was reduced with multiple swallows.  Pt also had one  episode of reflux during the study. Recommend Dys. 2 textures  with nectar-thick liquids with strict swallowing compensatory  strategies to decrease aspiration risk due to pt's recent  difficulty tolerating current diet which I suspect is impacted by  his reflux. MD aware.   Swallow Evaluation Recommendations Diet Recommendations: Dysphagia 2 (Fine chop);Nectar-thick liquid Liquid Administration via: Cup Medication Administration: Whole meds with puree Supervision:  Patient able to self feed;Full supervision/cueing  for compensatory strategies Compensations: Slow rate;Small sips/bites;Multiple dry swallows  after each bite/sip;Clear throat intermittently;Follow solids  with liquid Postural Changes and/or Swallow  Maneuvers: Upright 30-60 min  after meal;Seated upright 90 degrees Oral Care Recommendations: Oral care BID Other Recommendations: Prohibited food (jello, ice cream, thin  soups);Remove water pitcher;Order thickener from pharmacy Follow up Recommendations: 24 hour supervision/assistance Prognosis Prognosis for Safe Diet Advancement: Fair Barriers to Reach Goals: Cognitive deficits;Motivation Individuals Consulted Consulted and Agree with Results and Recommendations:  Patient;RN;MD  SLP Assessment/Plan Dysphagia Diagnosis: Mild oral phase dysphagia;Mild pharyngeal  phase dysphagia;Mild cervical esophageal phase dysphagia Clinical impression: Pt demonstrates a mild oral-pharyngeal  dysphagia characterized by decreased mastication and manipulation  with mechanical soft textures and delayed swallow initiation to  the pyiform sinuses with thin liquids resulting in flash  penetration. Intermittent penetration was also noted from pyiform  sinus residue that cleared with a cued cough/throat clear.  Pt  demonstrates mild pyiform sinus, valleculae and CP segment  residue that was reduced with multiple swallows.  Pt also had one  episode of reflux during the study. Recommend Dys. 2 textures  with nectar-thick liquids with strict swallowing compensatory  strategies to decrease aspiration risk due to pt's recent  difficulty tolerating current diet which I suspect is impacted by  his reflux. MD aware.    Short Term Goals: Week 1: SLP Short Term Goal 1 (Week 1): Pt will utilize  swallowing compensatory strategies to minimize overt s/s of  aspiration with Min A verbal cues.  SLP Short Term Goal 2 (Week 1): Patient will select attention to  basic and familiar tasks with minimum miultimodal cues.  SLP Short Term Goal 3 (Week 1): Patient will demonstrate mildly  complex problem solving abilities during functional tasks with  minimal verbal cues.  SLP Short Term Goal 4 (Week 1): Patient will utilize external  memory aids to faciliate  retrieval of basic daily information  with minimal verbal cues. SLP Short Term Goal 5 (Week 1): Patient will utilize compensatory  strategies to increase intelligible speech, with minimal verbal  and visual cues.   SLP Short Term Goal 6 (Week 1): pt will consume trials of thin  liquids without overt s/s of aspiration with supervision verbal  cues with 100% of trials.   General:  Date of Onset: 11/24/12 Type of Study: Modified Barium Swallowing Study Reason for Referral: Objectively evaluate swallowing function Previous Swallow Assessment: BSE on 10/31/12 and recommended Dys.  1 textures with honey-thick liquids Diet Prior to this Study: Honey-thick liquids;Dysphagia 1 (puree) Temperature Spikes Noted: No Respiratory Status: Room air Behavior/Cognition: Alert;Cooperative Oral Cavity - Dentition: Missing dentition Oral Motor / Sensory Function: Impaired - see Bedside swallow  eval Self-Feeding Abilities: Able to feed self;Needs assist Patient Positioning: Upright in chair Baseline Vocal Quality: Clear Volitional Cough: Strong Volitional Swallow: Able to elicit Pharyngeal Secretions: Not observed secondary MBS  Reason for Referral:  Objectively evaluate swallowing function   Oral Phase Oral Preparation/Oral Phase Oral Phase: Impaired Oral - Solids Oral - Mechanical Soft: Delayed oral transit;Impaired  mastication;Weak lingual manipulation Pharyngeal Phase  Pharyngeal Phase Pharyngeal Phase: Impaired Pharyngeal - Nectar Pharyngeal - Nectar Teaspoon: Delayed swallow initiation (to  valleculae) Pharyngeal - Nectar Cup: Delayed swallow initiation (to pyiform  sinuses) Pharyngeal - Thin Pharyngeal - Thin Teaspoon: Delayed swallow  initiation;Penetration/Aspiration during swallow;Pharyngeal  residue - pyriform sinuses;Pharyngeal residue - cp segment Penetration/Aspiration details (thin teaspoon): Material enters  airway, remains ABOVE vocal cords then  ejected out Pharyngeal - Thin Cup: Penetration/Aspiration during   swallow;Pharyngeal residue - cp segment;Pharyngeal residue -  pyriform sinuses;Delayed swallow initiation Penetration/Aspiration details (thin cup): Material enters  airway, remains ABOVE vocal cords and not ejected out Pharyngeal - Solids Pharyngeal - Puree: Delayed swallow initiation;Pharyngeal residue  - cp segment;Pharyngeal residue - valleculae Pharyngeal - Mechanical Soft: Delayed swallow  initiation;Pharyngeal residue - pyriform sinuses;Pharyngeal  residue - cp segment;Pharyngeal residue - valleculae Cervical Esophageal Phase  Cervical Esophageal Phase Cervical Esophageal Phase: Impaired Cervical Esophageal Phase - Comment Cervical Esophageal Comment: Pt belched during study and food  contents were refluxed back up through his cp segments, but  swallowed back down.   PAYNE, COURTNEY 11/01/2012, 3:46 PM       Recent Labs  10/31/12 0500  WBC 5.8  HGB 13.8  HCT 39.1  PLT 311    Recent Labs  10/31/12 0500  NA 138  K 3.3*  CL 101  GLUCOSE 231*  BUN 7  CREATININE 1.09  CALCIUM 8.6   CBG (last 3)   Recent Labs  11/01/12 1625 11/01/12 2046 11/02/12 0730  GLUCAP 154* 130* 151*    Wt Readings from Last 3 Encounters:  10/28/12 96.8 kg (213 lb 6.5 oz)  10/25/12 92.1 kg (203 lb 0.7 oz)  10/25/12 92.1 kg (203 lb 0.7 oz)    Physical Exam:  Constitutional: He is oriented to person, place, and time. He appears well-developed and well-nourished.  Tends to keep eyes closed when speaking but will open up. HENT:  Head: Normocephalic and atraumatic.  Right Ear: External ear normal.  Left Ear: External ear normal.  Eyes: Conjunctivae and EOM are normal. Pupils are equal, round, and reactive to light. Right eye exhibits no discharge. Left eye exhibits no discharge. No scleral icterus.  Dysconjugate gaze. Arcus senilis. Pinpoint pupils.  Neck: Neck supple. No JVD present. No thyromegaly present.  Limited due to posterior neck incision.  Cardiovascular: Normal rate and regular rhythm.  Exam reveals no gallop and no friction rub.  No murmur heard.  Pulmonary/Chest: Effort normal and breath sounds normal. No respiratory distress. He has no wheezes. He has no rales. He exhibits no tenderness.  Abdominal: Soft. Bowel sounds are normal. He exhibits no distension. There is no tenderness. There is no rebound.  Lymphadenopathy:  He has no cervical adenopathy.  Neurological: He is alert and oriented to person, place, and time.  Moderate to severe dysarthria with occasional stuttering.   Occasional perseverative.   Visual deficits in L>R peripheral fields, dysconjugate gaze. Mild right sided weakness with ataxia RUE and decrease in motor control RLE with ataxia as well.  . Strength remains grossly 3+ to 4/5 in all 4 limbs.  Skin: Skin is warm and dry.  Musculoskeletal: pain in both greater trochs and surrounding ER's with palpation/pressure   Assessment/Plan: 1. 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 Physiatrist is providing close team supervision and 24 hour management of active medical problems listed below. Physiatrist and rehab team continue to assess barriers to discharge/monitor patient progress toward functional and medical goals. FIM: FIM - Bathing Bathing Steps Patient Completed: Chest;Right Arm;Left Arm;Abdomen;Front perineal area;Buttocks;Right upper leg;Left upper leg;Right lower leg (including foot);Left lower leg (including foot) Bathing: 4: Steadying assist  FIM - Upper Body Dressing/Undressing Upper body dressing/undressing steps patient completed: Thread/unthread right sleeve of pullover shirt/dresss;Thread/unthread left sleeve of pullover shirt/dress;Put head  through opening of pull over shirt/dress;Pull shirt over trunk Upper body dressing/undressing: 5: Set-up assist to: Obtain clothing/put  away FIM - Lower Body Dressing/Undressing Lower body dressing/undressing steps patient completed: Pull underwear up/down;Thread/unthread right pants leg;Thread/unthread left pants leg;Pull pants up/down;Don/Doff right shoe;Don/Doff left shoe Lower body dressing/undressing: 4: Min-Patient completed 75 plus % of tasks (max A for TED hose)  FIM - Toileting Toileting steps completed by patient: Adjust clothing prior to toileting;Performs perineal hygiene;Adjust clothing after toileting Toileting Assistive Devices: Grab bar or rail for support Toileting: 4: Steadying assist  FIM - Diplomatic Services operational officer Devices: Grab bars Toilet Transfers: 4-To toilet/BSC: Min A (steadying Pt. > 75%);4-From toilet/BSC: Min A (steadying Pt. > 75%)  FIM - Bed/Chair Transfer Bed/Chair Transfer Assistive Devices: Bed rails Bed/Chair Transfer: 4: Chair or W/C > Bed: Min A (steadying Pt. > 75%);4: Bed > Chair or W/C: Min A (steadying Pt. > 75%)  FIM - Locomotion: Wheelchair Distance: 150' Locomotion: Wheelchair: 5: Travels 150 ft or more: maneuvers on rugs and over door sills with supervision, cueing or coaxing FIM - Locomotion: Ambulation Ambulation/Gait Assistance: 2: Max assist Locomotion: Ambulation: 2: Travels 50 - 149 ft with minimal assistance (Pt.>75%)  Comprehension Comprehension Mode: Auditory Comprehension: 3-Understands basic 50 - 74% of the time/requires cueing 25 - 50%  of the time  Expression Expression Mode: Verbal Expression Assistive Devices: 6-Other (Comment) Expression: 3-Expresses basic 50 - 74% of the time/requires cueing 25 - 50% of the time. Needs to repeat parts of sentences.  Social Interaction Social Interaction: 4-Interacts appropriately 75 - 89% of the time - Needs redirection for appropriate language or to initiate interaction.  Problem Solving Problem Solving: 3-Solves basic 50 - 74% of the time/requires cueing 25 - 49% of the time  Memory Memory Mode:  Not assessed Memory: 3-Recognizes or recalls 50 - 74% of the time/requires cueing 25 - 49% of the time  Medical Problem List and Plan:  1. DVT Prophylaxis/Anticoagulation: Mechanical: Sequential compression devices, below knee Bilateral lower extremities  2. Headaches/pain: complained of back pain by report yesterday. Complained of hip pain to me this am  - trochanteric bursitis-  -lumbar xrays unremarkable  -ice/voltaren gel. Continue to work on ROM, avoid sleeping on sides 3. Mood: Seems to be in good spirits.  -in good spirits.  4. Neuropsych: This patient is capable of making decisions on his/her own behalf.  5. HTN: Will monitor with BID checks. Starting to trend upwards with increase in activity. May need additional agent. Continue Lotensin. Monitor renal status as on Nectar liquids.  6. DM type 2: Was on 70/30 insulin additionally at home-Hgb A1C @ 6.7. Monitor BS with AC/HS cbg checks. Continue metformin and glipizide--increased glipizide to bid--sugars improving 7. 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 Continue thiamine.  8. Hypokalemia: Likely dilutional due to ongoing IVF. Will change fluids to bedtime.  9. Dysphagia: Needs supervision for meals. Encourage fluids  -diet upgraded  LOS (Days) 5 A FACE TO FACE EVALUATION WAS PERFORMED  SWARTZ,ZACHARY T 11/02/2012 8:08 AM

## 2012-11-02 NOTE — Progress Notes (Signed)
Occupational Therapy Note  [name] MRN: [id number] Date of Birth: 12-13-1934 Today's Date: 11/02/2012  Time: 1300-1330 Pt c/o pain in upper back but did not rate; RN aware, repostioned Individual Therapy   Pt in bed upon arrival but quickly sat EOB and said he was ready.  Pt amb with RW to bathroom to use toilet; standing to void.  Pt required min A and min verbal cues to keep RW close to body.  Pt required min A while standing.  Pt exhibited right bias while ambulating and required physical assist to correct.  Pt transitioned to gym for standing acitivties and fucntional amb with RW for reaching tasks.  Lavone Neri [facility] 11/02/2012, 3:03 PM

## 2012-11-02 NOTE — Progress Notes (Signed)
Occupational Therapy Session Note  [name] MRN: [id number] Date of Birth: [birth year]  Today's Date: 11/02/2012 Time: 0730-0830 Time Calculation (min): 60 min  Short Term Goals: Week 1:  OT Short Term Goal 1 (Week 1): Pt.  will groom self with SBA OT Short Term Goal 2 (Week 1): Pt. will bath self with minimal OT Short Term Goal 3 (Week 1): Pt. will dress LB with minimal assist OT Short Term Goal 4 (Week 1): Pt. will transfer to toilet with moderatel assist OT Short Term Goal 5 (Week 1): Pt. will perform pericare with minimal assist  Skilled Therapeutic Interventions/Progress Updates:    1:1 self care retraining at shower level in ADL apartment. Focus on bed mobility to come to EOB, transfers with RW with steady A, sit to stands and stands to sits with verbal cues for hand placement, tub transfer using a tub bench (once on the bench pt able to put legs into bed with supervision, lateral leans for washing buttocks to increase independence. Pt with no c/o pain today, increasing his ability to don LB clothing and tie shoes (proped up on foot rest). Pt still requires steady A in standing due to posterior lean; difficulty with anterior weight shift.    Therapy Documentation Precautions:  Precautions Precautions: Fall Restrictions Weight Bearing Restrictions: No Pain:  no c/o pain   See FIM for current functional status  Therapy/Group: Individual Therapy  Roney Mans [facility] 11/02/2012, 8:11 AM

## 2012-11-02 NOTE — Progress Notes (Signed)
Speech Language Pathology Daily Session Note  [name] MRN: [id number] Date of Birth: [birth year]  Today's Date: 11/02/2012 Time: 9528-4132 Time Calculation (min): 45 min  Short Term Goals: Week 1: SLP Short Term Goal 1 (Week 1): Pt will utilize swallowing compensatory strategies to minimize overt s/s of aspiration with Min A verbal cues.  SLP Short Term Goal 2 (Week 1): Patient will select attention to basic and familiar tasks with minimum miultimodal cues.  SLP Short Term Goal 3 (Week 1): Patient will demonstrate mildly complex problem solving abilities during functional tasks with minimal verbal cues.  SLP Short Term Goal 4 (Week 1): Patient will utilize external memory aids to faciliate retrieval of basic daily information with minimal verbal cues. SLP Short Term Goal 5 (Week 1): Patient will utilize compensatory strategies to increase intelligible speech, with minimal verbal and visual cues.   SLP Short Term Goal 6 (Week 1): pt will consume trials of thin liquids without overt s/s of aspiration with supervision verbal cues with 100% of trials.   Skilled Therapeutic Interventions: Majority of session focused on speech intelligibility at the conversational level.  He needed moderate verbal assist to recall speech strategies and max verbal reminders to utilize during expression.  SLP emphasized strategy of over-articulation of phonemes and exaggerated labial and lingual movements.  He began to exhibit emergent awareness of intelligibility errors and self corrected (3-5 times) in the middle of the session.  Towards the end speech intelligibiltiy decreased and abiltiy to self correct.  Pt. observed with nectar sips OJ with one cough (mildly delayed).       FIM:  Comprehension Comprehension Mode: Auditory Comprehension: 5-Follows basic conversation/direction: With no assist Expression Expression Mode: Verbal Expression: 3-Expresses basic 50 - 74% of the  time/requires cueing 25 - 50% of the time. Needs to repeat parts of sentences. Social Interaction Social Interaction: 5-Interacts appropriately 90% of the time - Needs monitoring or encouragement for participation or interaction. Problem Solving Problem Solving: 4-Solves basic 75 - 89% of the time/requires cueing 10 - 24% of the time Memory Memory: 3-Recognizes or recalls 50 - 74% of the time/requires cueing 25 - 49% of the time FIM - Eating Eating Activity: 5: Needs verbal cues/supervision  Pain Pain Assessment Pain Assessment: No/denies pain  Therapy/Group: Individual Therapy  Royce Macadamia 11/02/2012, 1:25 PM

## 2012-11-02 NOTE — Progress Notes (Signed)
Physical Therapy Note  [name] MRN: [id number] Date of Birth: 11-Dec-1934 Today's Date: 11/02/2012  Time: 1100-1130 30 minutes  Pt continues to c/o back pain with all activities, hip pain only when seated.  Lower back tender to palpation.Treatment focused on gait training with RW increased distances.  Pt requires min A initially, mod A when fatigued (distance > 100').  Pt unaware of LOB to R and posterior, requires mod A to correct.  Attempt to have pt sit to stand with more eccentric control in order to reduce back pain with transitions, pt reports this is too difficult, continues to wince in pain with every stand to sit.  Individual therapy  Bill Jackson 11/02/2012, 12:13 PM

## 2012-11-02 NOTE — Progress Notes (Signed)
Speech Language Pathology Daily Session Note  [name] MRN: [id number] Date of Birth: 07/23/1935  Today's Date: 11/02/2012 Time: 1130-1205 Time Calculation (min): 35 min  Short Term Goals: Week 1: SLP Short Term Goal 1 (Week 1): Pt will utilize swallowing compensatory strategies to minimize overt s/s of aspiration with Min A verbal cues.  SLP Short Term Goal 2 (Week 1): Patient will select attention to basic and familiar tasks with minimum miultimodal cues.  SLP Short Term Goal 3 (Week 1): Patient will demonstrate mildly complex problem solving abilities during functional tasks with minimal verbal cues.  SLP Short Term Goal 4 (Week 1): Patient will utilize external memory aids to faciliate retrieval of basic daily information with minimal verbal cues. SLP Short Term Goal 5 (Week 1): Patient will utilize compensatory strategies to increase intelligible speech, with minimal verbal and visual cues.   SLP Short Term Goal 6 (Week 1): pt will consume trials of thin liquids without overt s/s of aspiration with supervision verbal cues with 100% of trials.   Skilled Therapeutic Interventions: Pt. seen in group swallowing therapy.  Pt. is on Dys 2 (minced diet) however received puree beans and mac and cheese but chicken salad was correct Dys 2 texture.  He required moderate cues for 2 swallows and throat clear.  No s/s aspiration.   FIM:  Comprehension Comprehension Mode: Auditory Comprehension: 5-Follows basic conversation/direction: With no assist Expression Expression Mode: Verbal Expression: 3-Expresses basic 50 - 74% of the time/requires cueing 25 - 50% of the time. Needs to repeat parts of sentences. Social Interaction Social Interaction: 5-Interacts appropriately 90% of the time - Needs monitoring or encouragement for participation or interaction. Problem Solving Problem Solving: 4-Solves basic 75 - 89% of the time/requires cueing 10 - 24% of the  time Memory Memory: 3-Recognizes or recalls 50 - 74% of the time/requires cueing 25 - 49% of the time FIM - Eating Eating Activity: 5: Needs verbal cues/supervision  Pain Pain Assessment Pain Assessment: No/denies pain  Therapy/Group: Group Therapy  Royce Macadamia 11/02/2012, 2:31 PM

## 2012-11-03 ENCOUNTER — Inpatient Hospital Stay (HOSPITAL_COMMUNITY): Payer: Medicare Other | Admitting: *Deleted

## 2012-11-03 ENCOUNTER — Inpatient Hospital Stay (HOSPITAL_COMMUNITY): Payer: Medicare Other | Admitting: Physical Therapy

## 2012-11-03 ENCOUNTER — Encounter (HOSPITAL_COMMUNITY): Payer: BC Managed Care – PPO | Admitting: Occupational Therapy

## 2012-11-03 ENCOUNTER — Inpatient Hospital Stay (HOSPITAL_COMMUNITY): Payer: Medicare Other | Admitting: Speech Pathology

## 2012-11-03 DIAGNOSIS — H409 Unspecified glaucoma: Secondary | ICD-10-CM | POA: Diagnosis present

## 2012-11-03 DIAGNOSIS — M76899 Other specified enthesopathies of unspecified lower limb, excluding foot: Secondary | ICD-10-CM

## 2012-11-03 LAB — GLUCOSE, CAPILLARY
Glucose-Capillary: 182 mg/dL — ABNORMAL HIGH (ref 70–99)
Glucose-Capillary: 150 mg/dL — ABNORMAL HIGH (ref 70–99)
Glucose-Capillary: 189 mg/dL — ABNORMAL HIGH (ref 70–99)

## 2012-11-03 MED ORDER — LIDOCAINE 5 % EX PTCH
2.0000 | MEDICATED_PATCH | CUTANEOUS | Status: DC
  Administered 2012-11-04 – 2012-11-15 (×12): 2 via TRANSDERMAL
  Filled 2012-11-03 (×13): qty 2

## 2012-11-03 MED ORDER — OXYCODONE HCL 5 MG PO TABS
10.0000 mg | ORAL_TABLET | Freq: Two times a day (BID) | ORAL | Status: DC
  Administered 2012-11-04 – 2012-11-08 (×9): 10 mg via ORAL
  Filled 2012-11-03 (×8): qty 2

## 2012-11-03 NOTE — Progress Notes (Signed)
Physical Therapy Note  [name] MRN: [id number] Date of Birth: Oct 13, 1934 Today's Date: 11/03/2012  Time 1: 1100-1130 30 minutes  1:1 Pt c/o hip and back pain with stand to sit, requires max A for control due to c/o "spasms and pain".  Standing balance and stand pivot transfer training with min A for trunk control to decrease ataxia.  Standing side stepping and gait with min-mod A, pt limited by pain throughout session.   Time 2: 1430-1500 30 minutes  1:1  Pt continues with c/o severe back and hip pain with stand to sit and bed mobility, PA made aware.  Gait with RW in controlled environment with min A, cues for safety with RW.  Obstacle negotiation and stair negotiation with min A, cues for safety.    Saahas Hidrogo 11/03/2012, 11:29 AM

## 2012-11-03 NOTE — Progress Notes (Signed)
Occupational Therapy Session Note  Patient Details  Name: RANDALL COLDEN MRN: [id number] Date of Birth: 02-10-1935  Today's Date: 11/03/2012 Time: 4010-2725 Time Calculation (min): 45 min  Short Term Goals: Week 1:  OT Short Term Goal 1 (Week 1): Pt.  will groom self with SBA OT Short Term Goal 2 (Week 1): Pt. will bath self with minimal OT Short Term Goal 3 (Week 1): Pt. will dress LB with minimal assist OT Short Term Goal 4 (Week 1): Pt. will transfer to toilet with moderatel assist OT Short Term Goal 5 (Week 1): Pt. will perform pericare with minimal assist  Skilled Therapeutic Interventions/Progress Updates:    1:1 self care retraining at shower level down in ADL apartment again (pt's choice) with continued practiced with functional ambulation with RW with min A for right lean biosis, tub bench transfers, functional turns with RW, standing balance with and without UE support for dressing, lateral leans for pericare in shower to increase safety, activity tolerance, and safety with mobility with min cuing for RW and hand placement   Therapy Documentation Precautions:  Precautions Precautions: Fall Restrictions Weight Bearing Restrictions: No Pain:   mild pain in back of head and neck- allowed for rest  See FIM for current functional status  Therapy/Group: Individual Therapy  Roney Mans [facility] 11/03/2012, 8:14 AM

## 2012-11-03 NOTE — Progress Notes (Signed)
Speech Language Pathology Daily Session Notes  [name] MRN: [id number] Date of Birth: Jan 26, 1935  Today's Date: 11/03/2012  Session 1 Time: 6213-0865 Time Calculation (min): 45 min  Session 2 Time: 1130-1145 Time Calculation: 15 min  Short Term Goals: Week 1: SLP Short Term Goal 1 (Week 1): Pt will utilize swallowing compensatory strategies to minimize overt s/s of aspiration with Min A verbal cues.  SLP Short Term Goal 2 (Week 1): Patient will select attention to basic and familiar tasks with minimum miultimodal cues.  SLP Short Term Goal 3 (Week 1): Patient will demonstrate mildly complex problem solving abilities during functional tasks with minimal verbal cues.  SLP Short Term Goal 4 (Week 1): Patient will utilize external memory aids to faciliate retrieval of basic daily information with minimal verbal cues. SLP Short Term Goal 5 (Week 1): Patient will utilize compensatory strategies to increase intelligible speech, with minimal verbal and visual cues.   SLP Short Term Goal 6 (Week 1): pt will consume trials of thin liquids without overt s/s of aspiration with supervision verbal cues with 100% of trials.   Skilled Therapeutic Interventions:  Session 1: Treatment focus on speech, cognitive and dysphagia goals. Pt required Mod A verbal and question cues to utilize speech intelligibility strategies of a slow rate and over articulation to reach 80% intelligibility at the sentence level. Pt participated in self-care task and completed with Mod I with extra time. Pt also required Min verbal and question cues for functional/verbal problem solving for managing his personal bills while in the hospital. Pt consumed trails of thin liquids via cup and demonstrated cough X 1 of 5 sips, suspect spontaneous cough cleared penetrates.     Session 2: Treatment focus on dysphagia and speech goals. Although pt is currently on a Dys.2 diet with nectar-thick liquids, he received a  lunch tray of Dys. 3 textures. Pt demonstrated efficient mastication but required liquid washed to clear mild oral residue. Pt without overt s/s of aspiration but required Mod verbal cues to utilize multiple swallows. Pt also required Mod verbal and question cues for utilization of speech intelligibility strategies at the phrase level.   FIM:  Comprehension Comprehension Mode: Auditory Comprehension: 5-Understands basic 90% of the time/requires cueing < 10% of the time Expression Expression Mode: Verbal Expression: 3-Expresses basic 50 - 74% of the time/requires cueing 25 - 50% of the time. Needs to repeat parts of sentences. Social Interaction Social Interaction: 5-Interacts appropriately 90% of the time - Needs monitoring or encouragement for participation or interaction. Problem Solving Problem Solving: 4-Solves basic 75 - 89% of the time/requires cueing 10 - 24% of the time Memory Memory: 3-Recognizes or recalls 50 - 74% of the time/requires cueing 25 - 49% of the time FIM - Eating Eating Activity: 5: Supervision/cues  Pain Pain Assessment Pain Assessment: No/denies pain  Therapy/Group: Individual Therapy and Group Therapy  Johana Hopkinson 11/03/2012, 1:15 PM

## 2012-11-03 NOTE — Progress Notes (Signed)
Subjective/Complaints: Slept well. Still with some hip pain. A 12 point review of systems has been performed and if not noted above is otherwise negative.   Objective: Vital Signs: Blood pressure 133/77, pulse 66, temperature 98.5 F (36.9 C), temperature source Oral, resp. rate 18, height 5\' 7"  (1.702 m), weight 93.1 kg (205 lb 4 oz), SpO2 98.00%. Dg Swallowing Func-speech Pathology  11/01/2012  Bill Jackson, CCC-SLP     11/01/2012  3:49 PM Objective Swallowing Evaluation: Modified Barium Swallowing Study   [name] MRN: [id number] Date of Birth: 05/12/1935  Today's Date: 11/01/2012 Time: 4098-1191 Time Calculation (min): 30 min  Past Medical History:  Past Medical History  Diagnosis Date  . Hypertension   . Diabetes mellitus    Past Surgical History:  Past Surgical History  Procedure Laterality Date  . Heart stents  x3  . Cardiac surgery    . Hernia repair    . Rotator cuff repair    . Craniectomy N/A 10/25/2012    Procedure: Suboccipital Craniectomy for Evacuation of  Cerebellar Hematoma;  Surgeon: Maeola Harman, MD;  Location: [facility]  [facility];  Service: Neurosurgery;  Laterality: N/A;   Suboccipital Craniectomy for Evacuation of Cerebellar Hematoma   HPI: Pt is a 78 y.o. male history of hypertension and diabetes  mellitus as well as alcohol abuse, presenting with acute onset of  nausea and vomiting and complained of dizziness with ataxia 3/24.  CT scan of the head showed a 5 x 3.5 cm acute cerebellar  hemorrhage with mild mass effect on fourth ventricle. Pt had a  suboccipital craniectomy for evacuation of cerebellar hematoma  3/25. Pt had BSE on 10/26/12 and was recommend Dys. 3 textures  with nectar thick liquids, however, pt demonstrated difficulty  tolerating diet and was ultimately downgraded to Dys. 1 textures  with honey-thick liquids. MBSS today to assess for best diet  recommendation and to minimize aspiration risk.    Recommendation/Prognosis  Clinical Impression  Dysphagia Diagnosis: Mild oral phase dysphagia;Mild pharyngeal  phase dysphagia;Mild cervical esophageal phase dysphagia Clinical impression: Pt demonstrates a mild oral-pharyngeal  dysphagia characterized by decreased mastication and manipulation  with mechanical soft textures and delayed swallow initiation to  the pyiform sinuses with thin liquids resulting in flash  penetration. Intermittent penetration was also noted from pyiform  sinus residue that cleared with a cued cough/throat clear.  Pt  demonstrates mild pyiform sinus, valleculae and CP segment  residue that was reduced with multiple swallows.  Pt also had one  episode of reflux during the study. Recommend Dys. 2 textures  with nectar-thick liquids with strict swallowing compensatory  strategies to decrease aspiration risk due to pt's recent  difficulty tolerating current diet which I suspect is impacted by  his reflux. MD aware.   Swallow Evaluation Recommendations Diet Recommendations: Dysphagia 2 (Fine chop);Nectar-thick liquid Liquid Administration via: Cup Medication Administration: Whole meds with puree Supervision: Patient able to self feed;Full supervision/cueing  for compensatory strategies Compensations: Slow rate;Small sips/bites;Multiple dry swallows  after each bite/sip;Clear throat intermittently;Follow solids  with liquid Postural Changes and/or Swallow Maneuvers: Upright 30-60 min  after meal;Seated upright 90 degrees Oral Care Recommendations: Oral care BID Other Recommendations: Prohibited food (jello, ice cream, thin  soups);Remove water pitcher;Order thickener from pharmacy Follow up Recommendations: 24 hour supervision/assistance Prognosis Prognosis for Safe Diet Advancement: Fair Barriers to Reach Goals: Cognitive deficits;Motivation Individuals Consulted Consulted and Agree with Results and Recommendations:  Patient;RN;MD  SLP Assessment/Plan Dysphagia Diagnosis: Mild  oral phase dysphagia;Mild pharyngeal  phase dysphagia;Mild  cervical esophageal phase dysphagia Clinical impression: Pt demonstrates a mild oral-pharyngeal  dysphagia characterized by decreased mastication and manipulation  with mechanical soft textures and delayed swallow initiation to  the pyiform sinuses with thin liquids resulting in flash  penetration. Intermittent penetration was also noted from pyiform  sinus residue that cleared with a cued cough/throat clear.  Pt  demonstrates mild pyiform sinus, valleculae and CP segment  residue that was reduced with multiple swallows.  Pt also had one  episode of reflux during the study. Recommend Dys. 2 textures  with nectar-thick liquids with strict swallowing compensatory  strategies to decrease aspiration risk due to pt's recent  difficulty tolerating current diet which I suspect is impacted by  his reflux. MD aware.    Short Term Goals: Week 1: SLP Short Term Goal 1 (Week 1): Pt will utilize  swallowing compensatory strategies to minimize overt s/s of  aspiration with Min A verbal cues.  SLP Short Term Goal 2 (Week 1): Patient will select attention to  basic and familiar tasks with minimum miultimodal cues.  SLP Short Term Goal 3 (Week 1): Patient will demonstrate mildly  complex problem solving abilities during functional tasks with  minimal verbal cues.  SLP Short Term Goal 4 (Week 1): Patient will utilize external  memory aids to faciliate retrieval of basic daily information  with minimal verbal cues. SLP Short Term Goal 5 (Week 1): Patient will utilize compensatory  strategies to increase intelligible speech, with minimal verbal  and visual cues.   SLP Short Term Goal 6 (Week 1): pt will consume trials of thin  liquids without overt s/s of aspiration with supervision verbal  cues with 100% of trials.   General:  Date of Onset: 11/24/12 Type of Study: Modified Barium Swallowing Study Reason for Referral: Objectively evaluate swallowing function Previous Swallow Assessment: BSE on 10/31/12 and recommended Dys.  1 textures  with honey-thick liquids Diet Prior to this Study: Honey-thick liquids;Dysphagia 1 (puree) Temperature Spikes Noted: No Respiratory Status: Room air Behavior/Cognition: Alert;Cooperative Oral Cavity - Dentition: Missing dentition Oral Motor / Sensory Function: Impaired - see Bedside swallow  eval Self-Feeding Abilities: Able to feed self;Needs assist Patient Positioning: Upright in chair Baseline Vocal Quality: Clear Volitional Cough: Strong Volitional Swallow: Able to elicit Pharyngeal Secretions: Not observed secondary MBS  Reason for Referral:  Objectively evaluate swallowing function   Oral Phase Oral Preparation/Oral Phase Oral Phase: Impaired Oral - Solids Oral - Mechanical Soft: Delayed oral transit;Impaired  mastication;Weak lingual manipulation Pharyngeal Phase  Pharyngeal Phase Pharyngeal Phase: Impaired Pharyngeal - Nectar Pharyngeal - Nectar Teaspoon: Delayed swallow initiation (to  valleculae) Pharyngeal - Nectar Cup: Delayed swallow initiation (to pyiform  sinuses) Pharyngeal - Thin Pharyngeal - Thin Teaspoon: Delayed swallow  initiation;Penetration/Aspiration during swallow;Pharyngeal  residue - pyriform sinuses;Pharyngeal residue - cp segment Penetration/Aspiration details (thin teaspoon): Material enters  airway, remains ABOVE vocal cords then ejected out Pharyngeal - Thin Cup: Penetration/Aspiration during  swallow;Pharyngeal residue - cp segment;Pharyngeal residue -  pyriform sinuses;Delayed swallow initiation Penetration/Aspiration details (thin cup): Material enters  airway, remains ABOVE vocal cords and not ejected out Pharyngeal - Solids Pharyngeal - Puree: Delayed swallow initiation;Pharyngeal residue  - cp segment;Pharyngeal residue - valleculae Pharyngeal - Mechanical Soft: Delayed swallow  initiation;Pharyngeal residue - pyriform sinuses;Pharyngeal  residue - cp segment;Pharyngeal residue - valleculae Cervical Esophageal Phase  Cervical Esophageal Phase Cervical Esophageal Phase: Impaired  Cervical Esophageal Phase - Comment Cervical Esophageal Comment: Pt belched during  study and food  contents were refluxed back up through his cp segments, but  swallowed back down.   PAYNE, COURTNEY 11/01/2012, 3:46 PM      No results found for this basename: WBC, HGB, HCT, PLT,  in the last 72 hours No results found for this basename: NA, K, CL, CO, GLUCOSE, BUN, CREATININE, CALCIUM,  in the last 72 hours CBG (last 3)   Recent Labs  11/02/12 1633 11/02/12 2029 11/03/12 0710  GLUCAP 155* 151* 182*    Wt Readings from Last 3 Encounters:  11/02/12 93.1 kg (205 lb 4 oz)  10/25/12 92.1 kg (203 lb 0.7 oz)  10/25/12 92.1 kg (203 lb 0.7 oz)    Physical Exam:  Constitutional: He is oriented to person, place, and time. He appears well-developed and well-nourished.  Tends to keep eyes closed when speaking but will open up. HENT:  Head: Normocephalic and atraumatic.  Right Ear: External ear normal.  Left Ear: External ear normal.  Eyes: Conjunctivae and EOM are normal. Pupils are equal, round, and reactive to light. Right eye exhibits no discharge. Left eye exhibits no discharge. No scleral icterus.  Dysconjugate gaze. Arcus senilis. Pinpoint pupils.  Neck: Neck supple. No JVD present. No thyromegaly present.  Limited due to posterior neck incision.  Cardiovascular: Normal rate and regular rhythm. Exam reveals no gallop and no friction rub.  No murmur heard.  Pulmonary/Chest: Effort normal and breath sounds normal. No respiratory distress. He has no wheezes. He has no rales. He exhibits no tenderness.  Abdominal: Soft. Bowel sounds are normal. He exhibits no distension. There is no tenderness. There is no rebound.  Lymphadenopathy:  He has no cervical adenopathy.  Neurological: He is alert and oriented to person, place, and time.  Moderate to severe dysarthria with occasional stuttering.   Occasional perseverative.   Visual deficits in L>R peripheral fields, dysconjugate gaze. Mild right  sided weakness with ataxia RUE and decrease in motor control RLE with ataxia as well.  . Strength remains grossly 3+ to 4/5 in all 4 limbs.  Skin: Skin is warm and dry.  Musculoskeletal: pain in both greater trochs and surrounding ER's with palpation/pressure   Assessment/Plan: 1. 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 Physiatrist is providing close team supervision and 24 hour management of active medical problems listed below. Physiatrist and rehab team continue to assess barriers to discharge/monitor patient progress toward functional and medical goals. FIM: FIM - Bathing Bathing Steps Patient Completed: Chest;Right Arm;Left Arm;Abdomen;Front perineal area;Buttocks;Right upper leg;Left upper leg;Right lower leg (including foot);Left lower leg (including foot) Bathing: 5: Set-up assist to: Adjust water temp (remained seated)  FIM - Upper Body Dressing/Undressing Upper body dressing/undressing steps patient completed: Thread/unthread right sleeve of pullover shirt/dresss;Thread/unthread left sleeve of pullover shirt/dress;Put head through opening of pull over shirt/dress;Pull shirt over trunk Upper body dressing/undressing: 5: Set-up assist to: Obtain clothing/put away FIM - Lower Body Dressing/Undressing Lower body dressing/undressing steps patient completed: Pull underwear up/down;Thread/unthread right pants leg;Thread/unthread left pants leg;Pull pants up/down;Don/Doff right shoe;Don/Doff left shoe;Thread/unthread right underwear leg;Thread/unthread left underwear leg;Fasten/unfasten pants;Fasten/unfasten right shoe;Fasten/unfasten left shoe Lower body dressing/undressing: 4: Steadying Assist  FIM - Toileting Toileting steps completed by patient: Adjust clothing prior to toileting;Performs perineal hygiene;Adjust  clothing after toileting Toileting Assistive Devices: Grab bar or rail for support Toileting: 4: Steadying assist  FIM - Diplomatic Services operational officer Devices:  Walker Toilet Transfers: 4-To toilet/BSC: Min A (steadying Pt. > 75%);4-From toilet/BSC: Min A (steadying Pt. > 75%)  FIM - Bed/Chair Transfer Bed/Chair Transfer Assistive Devices: Bed rails Bed/Chair Transfer: 4: Chair or W/C > Bed: Min A (steadying Pt. > 75%);5: Supine > Sit: Supervision (verbal cues/safety issues);4: Bed > Chair or W/C: Min A (steadying Pt. > 75%)  FIM - Locomotion: Wheelchair Distance: 150' Locomotion: Wheelchair: 0: Activity did not occur FIM - Locomotion: Ambulation Ambulation/Gait Assistance: 2: Max assist Locomotion: Ambulation: 2: Travels 50 - 149 ft with minimal assistance (Pt.>75%)  Comprehension Comprehension Mode: Auditory Comprehension: 5-Follows basic conversation/direction: With no assist  Expression Expression Mode: Verbal Expression Assistive Devices: 6-Other (Comment) Expression: 3-Expresses basic 50 - 74% of the time/requires cueing 25 - 50% of the time. Needs to repeat parts of sentences.  Social Interaction Social Interaction: 5-Interacts appropriately 90% of the time - Needs monitoring or encouragement for participation or interaction.  Problem Solving Problem Solving: 4-Solves basic 75 - 89% of the time/requires cueing 10 - 24% of the time  Memory Memory Mode: Not assessed Memory: 3-Recognizes or recalls 50 - 74% of the time/requires cueing 25 - 49% of the time  Medical Problem List and Plan:  1. DVT Prophylaxis/Anticoagulation: Mechanical: Sequential compression devices, below knee Bilateral lower extremities  2. Headaches/pain: complained of back pain by report yesterday. Complained of hip pain to me this am  -trochanteric bursitis  -lumbar xrays unremarkable  -continue ice/voltaren gel. Continue to work on ROM, avoid sleeping on sides 3. Mood: Seems to be in  good spirits.  -in good spirits.  4. Neuropsych: This patient is capable of making decisions on his/her own behalf.  5. HTN: Will monitor with BID checks. Starting to trend upwards with increase in activity. May need additional agent. Continue Lotensin. Monitor renal status as on Nectar liquids.  6. DM type 2: Was on 70/30 insulin additionally at home-Hgb A1C @ 6.7. Monitor BS with AC/HS cbg checks. Continue metformin and glipizide--increased glipizide to bid--sugars better 7. 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 Continue thiamine.  8. Hypokalemia: Likely dilutional due to ongoing IVF. Will change fluids to bedtime.  9. Dysphagia: Needs supervision for meals. Encourage fluids  -diet upgraded  LOS (Days) 6 A FACE TO FACE EVALUATION WAS PERFORMED  SWARTZ,ZACHARY T 11/03/2012 7:54 AM   Subjective/Complaints: Hip pain better this am. Able to sleep fairly well A 12 point review of systems has been performed and if not noted above is otherwise negative.   Objective: Vital Signs: Blood pressure 133/77, pulse 66, temperature 98.5 F (36.9 C), temperature source Oral, resp. rate 18, height 5\' 7"  (1.702 m), weight 93.1 kg (205 lb 4 oz), SpO2 98.00%. Dg Swallowing Func-speech Pathology  11/01/2012  Bill Jackson, CCC-SLP     11/01/2012  3:49 PM Objective Swallowing Evaluation: Modified Barium Swallowing Study   [name] MRN: [id number] Date of Birth: Mar 23, 1935  Today's Date: 11/01/2012 Time: 4098-1191 Time Calculation (min): 30 min  Past Medical History:  Past Medical History  Diagnosis Date  . Hypertension   . Diabetes mellitus    Past Surgical History:  Past Surgical History  Procedure Laterality Date  . Heart stents  x3  . Cardiac surgery    . Hernia repair    . Rotator cuff repair    . Craniectomy N/A 10/25/2012    Procedure: Suboccipital Craniectomy for Evacuation of  Cerebellar Hematoma;  Surgeon: Maeola Harman, MD;  Location: [facility];  Service:  Neurosurgery;  Laterality: N/A;   Suboccipital Craniectomy for Evacuation of Cerebellar Hematoma   HPI: Pt is a 78 y.o. male history of hypertension and diabetes  mellitus as well as alcohol abuse, presenting with acute onset of  nausea and vomiting and complained of dizziness with ataxia 3/24.  CT scan of the head showed a 5 x 3.5 cm acute cerebellar  hemorrhage with mild mass effect on fourth ventricle. Pt had a  suboccipital craniectomy for evacuation of cerebellar hematoma  3/25. Pt had BSE on 10/26/12 and was recommend Dys. 3 textures  with nectar thick liquids, however, pt demonstrated difficulty  tolerating diet and was ultimately downgraded to Dys. 1 textures  with honey-thick liquids. MBSS today to assess for best diet  recommendation and to minimize aspiration risk.    Recommendation/Prognosis  Clinical Impression Dysphagia Diagnosis: Mild oral phase dysphagia;Mild pharyngeal  phase dysphagia;Mild cervical esophageal phase dysphagia Clinical impression: Pt demonstrates a mild oral-pharyngeal  dysphagia characterized by decreased mastication and manipulation  with mechanical soft textures and delayed swallow initiation to  the pyiform sinuses with thin liquids resulting in flash  penetration. Intermittent penetration was also noted from pyiform  sinus residue that cleared with a cued cough/throat clear.  Pt  demonstrates mild pyiform sinus, valleculae and CP segment  residue that was reduced with multiple swallows.  Pt also had one  episode of reflux during the study. Recommend Dys. 2 textures  with nectar-thick liquids with strict swallowing compensatory  strategies to decrease aspiration risk due to pt's recent  difficulty tolerating current diet which I suspect is impacted by  his reflux. MD aware.   Swallow Evaluation Recommendations Diet Recommendations: Dysphagia 2 (Fine chop);Nectar-thick liquid Liquid Administration via: Cup Medication Administration: Whole meds with puree Supervision: Patient able  to self feed;Full supervision/cueing  for compensatory strategies Compensations: Slow rate;Small sips/bites;Multiple dry swallows  after each bite/sip;Clear throat intermittently;Follow solids  with liquid Postural Changes and/or Swallow Maneuvers: Upright 30-60 min  after meal;Seated upright 90 degrees Oral Care Recommendations: Oral care BID Other Recommendations: Prohibited food (jello, ice cream, thin  soups);Remove water pitcher;Order thickener from pharmacy Follow up Recommendations: 24 hour supervision/assistance Prognosis Prognosis for Safe Diet Advancement: Fair Barriers to Reach Goals: Cognitive deficits;Motivation Individuals Consulted Consulted and Agree with Results and Recommendations:  Patient;RN;MD  SLP Assessment/Plan Dysphagia Diagnosis: Mild oral phase dysphagia;Mild pharyngeal  phase dysphagia;Mild cervical esophageal phase dysphagia Clinical impression: Pt demonstrates a mild oral-pharyngeal  dysphagia characterized by decreased mastication and manipulation  with mechanical soft textures and delayed swallow initiation to  the pyiform sinuses with thin liquids resulting in flash  penetration. Intermittent penetration was also noted from pyiform  sinus residue that cleared with a cued cough/throat clear.  Pt  demonstrates mild pyiform sinus, valleculae and CP segment  residue that was reduced with multiple swallows.  Pt also had one  episode of reflux during the study. Recommend Dys. 2 textures  with nectar-thick liquids with strict swallowing compensatory  strategies to decrease aspiration risk due to pt's recent  difficulty tolerating current diet which I suspect is impacted by  his reflux. MD aware.    Short Term Goals: Week 1: SLP Short Term Goal 1 (Week 1): Pt will utilize  swallowing compensatory strategies to minimize overt s/s of  aspiration with Min A verbal cues.  SLP Short Term Goal 2 (Week 1): Patient will select attention to  basic and familiar tasks with minimum miultimodal cues.   SLP Short Term  Goal 3 (Week 1): Patient will demonstrate mildly  complex problem solving abilities during functional tasks with  minimal verbal cues.  SLP Short Term Goal 4 (Week 1): Patient will utilize external  memory aids to faciliate retrieval of basic daily information  with minimal verbal cues. SLP Short Term Goal 5 (Week 1): Patient will utilize compensatory  strategies to increase intelligible speech, with minimal verbal  and visual cues.   SLP Short Term Goal 6 (Week 1): pt will consume trials of thin  liquids without overt s/s of aspiration with supervision verbal  cues with 100% of trials.   General:  Date of Onset: 11/24/12 Type of Study: Modified Barium Swallowing Study Reason for Referral: Objectively evaluate swallowing function Previous Swallow Assessment: BSE on 10/31/12 and recommended Dys.  1 textures with honey-thick liquids Diet Prior to this Study: Honey-thick liquids;Dysphagia 1 (puree) Temperature Spikes Noted: No Respiratory Status: Room air Behavior/Cognition: Alert;Cooperative Oral Cavity - Dentition: Missing dentition Oral Motor / Sensory Function: Impaired - see Bedside swallow  eval Self-Feeding Abilities: Able to feed self;Needs assist Patient Positioning: Upright in chair Baseline Vocal Quality: Clear Volitional Cough: Strong Volitional Swallow: Able to elicit Pharyngeal Secretions: Not observed secondary MBS  Reason for Referral:  Objectively evaluate swallowing function   Oral Phase Oral Preparation/Oral Phase Oral Phase: Impaired Oral - Solids Oral - Mechanical Soft: Delayed oral transit;Impaired  mastication;Weak lingual manipulation Pharyngeal Phase  Pharyngeal Phase Pharyngeal Phase: Impaired Pharyngeal - Nectar Pharyngeal - Nectar Teaspoon: Delayed swallow initiation (to  valleculae) Pharyngeal - Nectar Cup: Delayed swallow initiation (to pyiform  sinuses) Pharyngeal - Thin Pharyngeal - Thin Teaspoon: Delayed swallow  initiation;Penetration/Aspiration during swallow;Pharyngeal   residue - pyriform sinuses;Pharyngeal residue - cp segment Penetration/Aspiration details (thin teaspoon): Material enters  airway, remains ABOVE vocal cords then ejected out Pharyngeal - Thin Cup: Penetration/Aspiration during  swallow;Pharyngeal residue - cp segment;Pharyngeal residue -  pyriform sinuses;Delayed swallow initiation Penetration/Aspiration details (thin cup): Material enters  airway, remains ABOVE vocal cords and not ejected out Pharyngeal - Solids Pharyngeal - Puree: Delayed swallow initiation;Pharyngeal residue  - cp segment;Pharyngeal residue - valleculae Pharyngeal - Mechanical Soft: Delayed swallow  initiation;Pharyngeal residue - pyriform sinuses;Pharyngeal  residue - cp segment;Pharyngeal residue - valleculae Cervical Esophageal Phase  Cervical Esophageal Phase Cervical Esophageal Phase: Impaired Cervical Esophageal Phase - Comment Cervical Esophageal Comment: Pt belched during study and food  contents were refluxed back up through his cp segments, but  swallowed back down.   PAYNE, COURTNEY 11/01/2012, 3:46 PM      No results found for this basename: WBC, HGB, HCT, PLT,  in the last 72 hours No results found for this basename: NA, K, CL, CO, GLUCOSE, BUN, CREATININE, CALCIUM,  in the last 72 hours CBG (last 3)   Recent Labs  11/02/12 1633 11/02/12 2029 11/03/12 0710  GLUCAP 155* 151* 182*    Wt Readings from Last 3 Encounters:  11/02/12 93.1 kg (205 lb 4 oz)  10/25/12 92.1 kg (203 lb 0.7 oz)  10/25/12 92.1 kg (203 lb 0.7 oz)    Physical Exam:  Constitutional: He is oriented to person, place, and time. He appears well-developed and well-nourished.  Tends to keep eyes closed when speaking but will open up. HENT:  Head: Normocephalic and atraumatic.  Right Ear: External ear normal.  Left Ear: External ear normal.  Eyes: Conjunctivae and EOM are normal. Pupils are equal, round, and reactive to light. Right eye exhibits no discharge. Left eye exhibits no discharge. No  scleral  icterus.  Dysconjugate gaze. Arcus senilis. Pinpoint pupils.  Neck: Neck supple. No JVD present. No thyromegaly present.  Limited due to posterior neck incision.  Cardiovascular: Normal rate and regular rhythm. Exam reveals no gallop and no friction rub.  No murmur heard.  Pulmonary/Chest: Effort normal and breath sounds normal. No respiratory distress. He has no wheezes. He has no rales. He exhibits no tenderness.  Abdominal: Soft. Bowel sounds are normal. He exhibits no distension. There is no tenderness. There is no rebound.  Lymphadenopathy:  He has no cervical adenopathy.  Neurological: He is alert and oriented to person, place, and time.  Moderate to severe dysarthria with occasional stuttering.   Occasional perseverative.   Visual deficits in L>R peripheral fields, dysconjugate gaze. Mild right sided weakness with ataxia RUE and decrease in motor control RLE with ataxia as well.  . Strength remains grossly 3+ to 4/5 in all 4 limbs.  Skin: Skin is warm and dry.  Musculoskeletal: pain in both greater trochs and surrounding ER's with palpation/pressure   Assessment/Plan: 1. 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 Physiatrist is providing close team supervision and 24 hour management of active medical problems listed below. Physiatrist and rehab team continue to assess barriers to discharge/monitor patient progress toward functional and medical goals. FIM: FIM - Bathing Bathing Steps Patient Completed: Chest;Right Arm;Left Arm;Abdomen;Front perineal area;Buttocks;Right upper leg;Left upper leg;Right lower leg (including foot);Left lower leg (including foot) Bathing: 5: Set-up assist to: Adjust water temp (remained seated)  FIM - Upper Body Dressing/Undressing Upper body dressing/undressing steps patient  completed: Thread/unthread right sleeve of pullover shirt/dresss;Thread/unthread left sleeve of pullover shirt/dress;Put head through opening of pull over shirt/dress;Pull shirt over trunk Upper body dressing/undressing: 5: Set-up assist to: Obtain clothing/put away FIM - Lower Body Dressing/Undressing Lower body dressing/undressing steps patient completed: Pull underwear up/down;Thread/unthread right pants leg;Thread/unthread left pants leg;Pull pants up/down;Don/Doff right shoe;Don/Doff left shoe;Thread/unthread right underwear leg;Thread/unthread left underwear leg;Fasten/unfasten pants;Fasten/unfasten right shoe;Fasten/unfasten left shoe Lower body dressing/undressing: 4: Steadying Assist  FIM - Toileting Toileting steps completed by patient: Adjust clothing prior to toileting;Performs perineal hygiene;Adjust clothing after toileting Toileting Assistive Devices: Grab bar or rail for support Toileting: 4: Steadying assist  FIM - Diplomatic Services operational officer Devices: Art gallery manager Transfers: 4-To toilet/BSC: Min A (steadying Pt. > 75%);4-From toilet/BSC: Min A (steadying Pt. > 75%)  FIM - Bed/Chair Transfer Bed/Chair Transfer Assistive Devices: Bed rails Bed/Chair Transfer: 4: Chair or W/C > Bed: Min A (steadying Pt. > 75%);5: Supine > Sit: Supervision (verbal cues/safety issues);4: Bed > Chair or W/C: Min A (steadying Pt. > 75%)  FIM - Locomotion: Wheelchair Distance: 150' Locomotion: Wheelchair: 0: Activity did not occur FIM - Locomotion: Ambulation Ambulation/Gait Assistance: 2: Max assist Locomotion: Ambulation: 2: Travels 50 - 149 ft with minimal assistance (Pt.>75%)  Comprehension Comprehension Mode: Auditory Comprehension: 5-Follows basic conversation/direction: With no assist  Expression Expression Mode: Verbal Expression Assistive Devices: 6-Other (Comment) Expression: 3-Expresses basic 50 - 74% of the time/requires cueing 25 - 50% of the time. Needs to  repeat parts of sentences.  Social Interaction Social Interaction: 5-Interacts appropriately 90% of the time - Needs monitoring or encouragement for participation or interaction.  Problem Solving Problem Solving: 4-Solves basic 75 - 89% of the time/requires cueing 10 - 24% of the time  Memory Memory Mode: Not assessed Memory: 3-Recognizes or recalls 50 - 74% of the  time/requires cueing 25 - 49% of the time  Medical Problem List and Plan:  1. DVT Prophylaxis/Anticoagulation: Mechanical: Sequential compression devices, below knee Bilateral lower extremities  2. Headaches/pain: complained of back pain by report yesterday. Complained of hip pain to me this am  - trochanteric bursitis-  -lumbar xrays unremarkable  -ice/voltaren gel. Continue to work on ROM, avoid sleeping on sides 3. Mood: Seems to be in good spirits.  -in good spirits.  4. Neuropsych: This patient is capable of making decisions on his/her own behalf.  5. HTN: Will monitor with BID checks. Starting to trend upwards with increase in activity. May need additional agent. Continue Lotensin. Monitor renal status as on Nectar liquids.  6. DM type 2: Was on 70/30 insulin additionally at home-Hgb A1C @ 6.7. Monitor BS with AC/HS cbg checks. Continue metformin and glipizide--increased glipizide to bid--sugars improving 7. 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 Continue thiamine.  8. Hypokalemia: Likely dilutional due to ongoing IVF. Will change fluids to bedtime.  9. Dysphagia: Needs supervision for meals. Encourage fluids  -diet upgraded  LOS (Days) 6 A FACE TO FACE EVALUATION WAS PERFORMED  SWARTZ,ZACHARY T 11/03/2012 7:54 AM

## 2012-11-03 NOTE — Progress Notes (Signed)
Social Work Patient ID: Bill Jackson, male   DOB: [birth year], 78 y.o.   MRN: [id number]  Have reviewed team conference with patient and with friend, Bill Jackson.  Both aware and agreeable with targeted d/c date of 4/11.  [name] notes that his home continues to be an option for pt at d/c, however, pt "... Is being a little stubborn."  Pt remains reluctant to staying with friend as he feels he is placing a burden on their family.  [name] plans to talk with pt further over the weekend.  In terms of d/c date, pt states, "I just hope I'm walking a little better by then."  Stressed to him that goals have been set for supervision and that he needs to speak further with his friend about plans.  Will follow up with them both by start of week.  Taneah Masri, LCSW

## 2012-11-03 NOTE — Progress Notes (Signed)
Occupational Therapy Note  [name] MRN: [id number] Date of Birth: 1935-03-18 Today's Date: 11/03/2012  Time: 1145-1200 (cotx with Speech Therapy-total time 1130-1200) Pt denied pain but constantly repositioned self in w/c Group therapy/cotreatment  Pt participated in self feeding group with focus on BUE use for all tasks associated with self feeding (setup, opening containers, and self feeding).  Pt completed all tasks at supervision level.   Lavone Neri [facility] 11/03/2012, 3:26 PM

## 2012-11-03 NOTE — Progress Notes (Signed)
Physical Therapy Weekly Progress Note  [name] MRN: [id number] Date of Birth: [birth year]  Today's Date: 11/03/2012  Patient has met 2 of 3 short term goals.  Pt has improved balance and transfers and is min A with both, pt continues to require mod A for ambulation longer distances when fatigued.  Patient continues to demonstrate the following deficits: decreased balance and coordination, ataxic gait, decreased awareness of balance deficits, and therefore will continue to benefit from skilled PT intervention to enhance overall performance with activity tolerance, balance, postural control, ability to compensate for deficits, awareness and coordination.  Patient not progressing toward long term goals.  See goal revision..  Continue plan of care.  PT Short Term Goals Week 1:  PT Short Term Goal 1 (Week 1): Pt will perform functional transfers with min A PT Short Term Goal 1 - Progress (Week 1): Met PT Short Term Goal 2 (Week 1): Pt will gait in controlled environment 50' with min A PT Short Term Goal 2 - Progress (Week 1): Progressing toward goal PT Short Term Goal 3 (Week 1): Pt will demo dynamic standing balance for functional task with min A PT Short Term Goal 3 - Progress (Week 1): Met  Skilled Therapeutic Interventions/Progress Updates:  Ambulation/gait training;Discharge planning;Functional mobility training;Therapeutic Activities;Wheelchair propulsion/positioning;Therapeutic Exercise;Neuromuscular re-education;Balance/vestibular training;Cognitive remediation/compensation;DME/adaptive equipment instruction;Pain management;Splinting/orthotics;UE/LE Strength taining/ROM;UE/LE Coordination activities;Stair training;Patient/family education;Functional electrical stimulation;Community reintegration    See FIM for current functional status   Steffi Noviello 11/03/2012, 7:51 AM

## 2012-11-03 NOTE — Progress Notes (Signed)
Recreational Therapy Discharge Summary [name] MRN: [id number] Date of Birth: 1934/12/05 Today's Date: 11/03/2012 Time:  1030-1045 Pain: c/o neck pain, unrated, repositioning offered Comments on progress toward goals: Attempted session, pt with little interest in TR services and declined participation.  Pt discharged for lack on interest. Patient/family agrees with progress made and goals achieved: Yes  Vaughan Garfinkle 11/03/2012, 4:14 PM

## 2012-11-04 ENCOUNTER — Encounter (HOSPITAL_COMMUNITY): Payer: BC Managed Care – PPO | Admitting: Occupational Therapy

## 2012-11-04 ENCOUNTER — Inpatient Hospital Stay (HOSPITAL_COMMUNITY): Payer: Medicare Other | Admitting: Speech Pathology

## 2012-11-04 ENCOUNTER — Inpatient Hospital Stay (HOSPITAL_COMMUNITY): Payer: Medicare Other

## 2012-11-04 ENCOUNTER — Inpatient Hospital Stay (HOSPITAL_COMMUNITY): Payer: Medicare Other | Admitting: Physical Therapy

## 2012-11-04 DIAGNOSIS — I10 Essential (primary) hypertension: Secondary | ICD-10-CM

## 2012-11-04 DIAGNOSIS — I619 Nontraumatic intracerebral hemorrhage, unspecified: Secondary | ICD-10-CM

## 2012-11-04 LAB — GLUCOSE, CAPILLARY
Glucose-Capillary: 204 mg/dL — ABNORMAL HIGH (ref 70–99)
Glucose-Capillary: 216 mg/dL — ABNORMAL HIGH (ref 70–99)
Glucose-Capillary: 147 mg/dL — ABNORMAL HIGH (ref 70–99)
Glucose-Capillary: 184 mg/dL — ABNORMAL HIGH (ref 70–99)

## 2012-11-04 MED ORDER — POTASSIUM CHLORIDE CRYS ER 20 MEQ PO TBCR
20.0000 meq | EXTENDED_RELEASE_TABLET | Freq: Every day | ORAL | Status: DC
  Administered 2012-11-04 – 2012-11-14 (×11): 20 meq via ORAL
  Filled 2012-11-04 (×12): qty 1

## 2012-11-04 NOTE — Progress Notes (Signed)
Occupational Therapy Session Note  [name] MRN: [id number] Date of Birth: 13-Dec-1934  Today's Date: 11/04/2012 Time: 7829-5621 Time Calculation (min): 45 min  Short Term Goals: Week 1:  OT Short Term Goal 1 (Week 1): Pt.  will groom self with SBA OT Short Term Goal 2 (Week 1): Pt. will bath self with minimal OT Short Term Goal 3 (Week 1): Pt. will dress LB with minimal assist OT Short Term Goal 4 (Week 1): Pt. will transfer to toilet with moderatel assist OT Short Term Goal 5 (Week 1): Pt. will perform pericare with minimal assist  Skilled Therapeutic Interventions/Progress Updates:    1:1 self care retraining at shower level in pt's room today with focus on bed mobility wihtout rail, sit to stand with recall of proper hand placement, standing balance with and without UE support, transfers with RW, RW safety with functional ambulation, attention to balance and ability to self correct balance, turning head to look before turning body to decrease dizziness, LB dressing with extra time, activity tolerance, hand eye coordination with grooming tasks. Pt still with right lean with functional ambulation requiring min to mod A to correct. Pt with LOB posteriorly in dynamic standing with significant delay in reactions.  Therapy Documentation Precautions:  Precautions Precautions: Fall Restrictions Weight Bearing Restrictions: No Pain: Pain Assessment Pain Assessment: 0-10 Pain Score:   5 Pain Type: Acute pain Pain Location: Back Pain Orientation: Medial;Lower Pain Radiating Towards: Hips Pain Descriptors: Aching Pain Frequency: Occasional Pain Onset: Other (Comment) (medicated prior to therapy) Patients Stated Pain Goal: 3 Pain Intervention(s): Medication (See eMAR) (tg)  See FIM for current functional status  Therapy/Group: Individual Therapy  Roney Mans [facility] 11/04/2012, 12:10 PM

## 2012-11-04 NOTE — Progress Notes (Signed)
Physical Therapy Note  [name] MRN: [id number] Date of Birth: [birth year] Today's Date: 11/04/2012  Time: 900-956 56 minutes  Pt continues to c/o severe back pain with stand to sit, RN aware.  Gait training with RW min A 2 x 150' with pt with increased LOB to R and posterior when fatigued.  Standing balance training focus on core and hip stability tap ups, stepping over objects all directions with mod-max A for balance.  Standing on compliant surface with reaching activities with mod A, pt easily fatigued in LEs with this exercise.  Pt continues to be limited by pain with all mobility tasks.  Individual therapy   Janda Cargo 11/04/2012, 9:58 AM

## 2012-11-04 NOTE — Progress Notes (Signed)
Subjective/Complaints: Pain improving. Slept well.  A 12 point review of systems has been performed and if not noted above is otherwise negative.   Objective: Vital Signs: Blood pressure 134/76, pulse 56, temperature 98.1 F (36.7 C), temperature source Oral, resp. rate 18, height 5\' 7"  (1.702 m), weight 93.1 kg (205 lb 4 oz), SpO2 95.00%. No results found. No results found for this basename: WBC, HGB, HCT, PLT,  in the last 72 hours No results found for this basename: NA, K, CL, CO, GLUCOSE, BUN, CREATININE, CALCIUM,  in the last 72 hours CBG (last 3)   Recent Labs  11/03/12 1623 11/03/12 2123 11/04/12 0715  GLUCAP 150* 189* 204*    Wt Readings from Last 3 Encounters:  11/02/12 93.1 kg (205 lb 4 oz)  10/25/12 92.1 kg (203 lb 0.7 oz)  10/25/12 92.1 kg (203 lb 0.7 oz)    Physical Exam:  Constitutional: He is oriented to person, place, and time. He appears well-developed and well-nourished.   Marland Kitchen HENT:  Head: Normocephalic and atraumatic.  Right Ear: External ear normal.  Left Ear: External ear normal.  Eyes: Conjunctivae and EOM are normal. Pupils are equal, round, and reactive to light. Right eye exhibits no discharge. Left eye exhibits no discharge. No scleral icterus.  Dysconjugate gaze. Arcus senilis. Pinpoint pupils.  Neck: Neck supple. No JVD present. No thyromegaly present.  Limited due to posterior neck incision.  Cardiovascular: Normal rate and regular rhythm. Exam reveals no gallop and no friction rub.  No murmur heard.  Pulmonary/Chest: Effort normal and breath sounds normal. No respiratory distress. He has no wheezes. He has no rales. He exhibits no tenderness.  Abdominal: Soft. Bowel sounds are normal. He exhibits no distension. There is no tenderness. There is no rebound.  Lymphadenopathy:  He has no cervical adenopathy.  Neurological: He is alert and oriented to person, place, and time.  Moderate to severe dysarthria     Occasional perseverative.   Visual  deficits in L>R peripheral fields, dysconjugate gaze. Mild right sided weakness with ataxia RUE and decrease in motor control RLE with ataxia as well.  . Strength remains grossly 3+ to 4/5 in all 4 limbs.  Skin: Skin is warm and dry.  Musculoskeletal: decreased pain in greater trochs and surrounding ER's with palpation/pressure   Assessment/Plan: 1. 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 Physiatrist is providing close team supervision and 24 hour management of active medical problems listed below. Physiatrist and rehab team continue to assess barriers to discharge/monitor patient progress toward functional and medical goals. FIM: FIM - Bathing Bathing Steps Patient Completed: Chest;Right Arm;Left Arm;Abdomen;Front perineal area;Buttocks;Right upper leg;Left upper leg;Right lower leg (including foot);Left lower leg (including foot) Bathing: 5: Set-up assist to: Adjust water temp (remained seated)  FIM - Upper Body Dressing/Undressing Upper body dressing/undressing steps patient completed: Thread/unthread right sleeve of pullover shirt/dresss;Thread/unthread left sleeve of pullover shirt/dress;Put head through opening of pull over shirt/dress;Pull shirt over trunk Upper body dressing/undressing: 5: Set-up assist to: Obtain clothing/put away FIM - Lower Body Dressing/Undressing Lower body dressing/undressing steps patient completed: Pull underwear up/down;Thread/unthread right pants leg;Thread/unthread left pants leg;Pull pants up/down;Don/Doff right shoe;Don/Doff left shoe;Thread/unthread right underwear leg;Thread/unthread left underwear leg;Fasten/unfasten pants;Fasten/unfasten right shoe;Fasten/unfasten left shoe Lower body dressing/undressing: 4: Steadying Assist  FIM - Toileting Toileting steps completed by patient:  Adjust clothing prior to toileting;Performs perineal hygiene;Adjust clothing after toileting Toileting  Assistive Devices: Grab bar or rail for support Toileting: 4: Steadying assist  FIM - Diplomatic Services operational officer Devices: Therapist, music Transfers: 4-To toilet/BSC: Min A (steadying Pt. > 75%);4-From toilet/BSC: Min A (steadying Pt. > 75%)  FIM - Bed/Chair Transfer Bed/Chair Transfer Assistive Devices: Bed rails Bed/Chair Transfer: 4: Chair or W/C > Bed: Min A (steadying Pt. > 75%);5: Supine > Sit: Supervision (verbal cues/safety issues);4: Bed > Chair or W/C: Min A (steadying Pt. > 75%)  FIM - Locomotion: Wheelchair Distance: 150' Locomotion: Wheelchair: 0: Activity did not occur FIM - Locomotion: Ambulation Ambulation/Gait Assistance: 2: Max assist Locomotion: Ambulation: 2: Travels 50 - 149 ft with minimal assistance (Pt.>75%)  Comprehension Comprehension Mode: Auditory Comprehension: 5-Understands complex 90% of the time/Cues < 10% of the time  Expression Expression Mode: Verbal Expression Assistive Devices: 6-Other (Comment) Expression: 3-Expresses basic 50 - 74% of the time/requires cueing 25 - 50% of the time. Needs to repeat parts of sentences.  Social Interaction Social Interaction: 5-Interacts appropriately 90% of the time - Needs monitoring or encouragement for participation or interaction.  Problem Solving Problem Solving: 4-Solves basic 75 - 89% of the time/requires cueing 10 - 24% of the time  Memory Memory Mode: Not assessed Memory: 3-Recognizes or recalls 50 - 74% of the time/requires cueing 25 - 49% of the time  Medical Problem List and Plan:  1. DVT Prophylaxis/Anticoagulation: Mechanical: Sequential compression devices, below knee Bilateral lower extremities  2. Headaches/pain: hips feeling better. Tend to get a little sore at end of day  - trochanteric bursitis bilaterally  -lumbar xrays unremarkable  -ice/voltaren gel. Continue to  work on ROM, avoid sleeping on sides 3. Mood: Seems to be in good spirits.  -in good spirits.  4. Neuropsych: This patient is capable of making decisions on his/her own behalf.  5. HTN: Will monitor with BID checks. Starting to trend upwards with increase in activity. May need additional agent. Continue Lotensin. Monitor renal status as on Nectar liquids.  6. DM type 2: Was on 70/30 insulin additionally at home-Hgb A1C @ 6.7. Monitor BS with AC/HS cbg checks. Continue metformin and glipizide--increased glipizide to bid.  May need further titration.  -dietary ed 7. 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 Continue thiamine.  8. Hypokalemia:  Potassium replacement 9. Dysphagia: Needs supervision for meals. Encourage fluids  -diet upgraded  LOS (Days) 7 A FACE TO FACE EVALUATION WAS PERFORMED  SWARTZ,ZACHARY T 11/04/2012 8:46 AM

## 2012-11-04 NOTE — Progress Notes (Signed)
Speech Language Pathology Daily Session Notes  [name] MRN: [id number] Date of Birth: 15-Jun-1935  Today's Date: 11/04/2012  Session 1 Time: 1000-1045 Time Calculation: 45 min  Session 2 Time: 1130-1145 Time Calculation (min): 15 min  Short Term Goals: Week 1: SLP Short Term Goal 1 (Week 1): Pt will utilize swallowing compensatory strategies to minimize overt s/s of aspiration with Min A verbal cues.  SLP Short Term Goal 2 (Week 1): Patient will select attention to basic and familiar tasks with minimum miultimodal cues.  SLP Short Term Goal 3 (Week 1): Patient will demonstrate mildly complex problem solving abilities during functional tasks with minimal verbal cues.  SLP Short Term Goal 4 (Week 1): Patient will utilize external memory aids to faciliate retrieval of basic daily information with minimal verbal cues. SLP Short Term Goal 5 (Week 1): Patient will utilize compensatory strategies to increase intelligible speech, with minimal verbal and visual cues.   SLP Short Term Goal 6 (Week 1): pt will consume trials of thin liquids without overt s/s of aspiration with supervision verbal cues with 100% of trials.   Skilled Therapeutic Interventions:  Session 1: Treatment focus on speech and dysphagia goals. SLP facilitated session by providing Max A verbal, visual and auditory feedback with use of a voice recorder to increase pt's emergent awareness of decreased speech intelligibility at the phrase level.  Pt continues to require total A for awareness and reported he could understand himself without difficulty. Pt also required Max A multimodal cueing for utilization of speech intelligibility strategies and was ~50% intelligible at the phrase level.  Pt consumed trials of thin liquids without overt s/s of aspiration and was provided education in regards to reflux precautions and foods to avoid. Pt needs reinforcement of information.    Session 2: Pt participated in  co-treatment with OT in din[employer] with focus on dysphagia and speech goals. Pt initiated conversation with other group members but required Max verbal cues and multiple repetitions for speech intelligibility at the phrase level. Pt consumed current diet without overt s/s of aspiration but required Mod verbal cues for utilization of swallowing compensatory strategies.   FIM:  Comprehension Comprehension Mode: Auditory Comprehension: 5-Understands basic 90% of the time/requires cueing < 10% of the time Expression Expression Mode: Verbal Expression: 2-Expresses basic 25 - 49% of the time/requires cueing 50 - 75% of the time. Uses single words/gestures. Social Interaction Social Interaction: 4-Interacts appropriately 75 - 89% of the time - Needs redirection for appropriate language or to initiate interaction. Problem Solving Problem Solving: 4-Solves basic 75 - 89% of the time/requires cueing 10 - 24% of the time Memory Memory: 3-Recognizes or recalls 50 - 74% of the time/requires cueing 25 - 49% of the time FIM - Eating Eating Activity: 5: Supervision/cues  Pain No reports of pain throughout sessions   Therapy/Group: Individual Therapy and Group Therapy  Bill Jackson 11/04/2012, 1:24 PM

## 2012-11-04 NOTE — Progress Notes (Signed)
Occupational Therapy Note  [name] MRN: [id number] Date of Birth: [birth year] Today's Date: 11/04/2012  Session 1 Time: 6578-4696 (cotx with Speech Therapy- total time 1130-1155) Pt c/o 5/10 pain through hips; repositioned; RN aware Group therapy  Pt participated in self feeding group with focus on BUE use for self feeding and set up; task initiation, and swallowing strategies. Pt required min verbal cues for task initiation and swallowing strategies.  Pt stated he wasn't very hungry today.   Session 2 Time: 1415-1445 Pt c/o 5/10 pain in hips when seated but stated pain was alleviated when standing; pt c/o increased pain when sitting down; repositioned Individual Therapy  Pt engaged in dynamic standing tasks and functional amb with RW for home mgmt tasks.  Pt maintaine very erect posture and demonstrates difficulty with trunk rotation.  Pt c/o dizzyness with head rotation and when ambulating maintains head at midline.  Pt required steady A when ambulating and during dynamic standing activities. Lavone Neri [facility] 11/04/2012, 2:59 PM

## 2012-11-05 ENCOUNTER — Inpatient Hospital Stay (HOSPITAL_COMMUNITY): Payer: Medicare Other

## 2012-11-05 ENCOUNTER — Inpatient Hospital Stay (HOSPITAL_COMMUNITY): Payer: Medicare Other | Admitting: *Deleted

## 2012-11-05 ENCOUNTER — Inpatient Hospital Stay (HOSPITAL_COMMUNITY): Payer: BC Managed Care – PPO | Admitting: Speech Pathology

## 2012-11-05 DIAGNOSIS — I619 Nontraumatic intracerebral hemorrhage, unspecified: Secondary | ICD-10-CM

## 2012-11-05 DIAGNOSIS — I10 Essential (primary) hypertension: Secondary | ICD-10-CM

## 2012-11-05 LAB — GLUCOSE, CAPILLARY: Glucose-Capillary: 210 mg/dL — ABNORMAL HIGH (ref 70–99)

## 2012-11-05 NOTE — Progress Notes (Signed)
Patient ID: RAJOHN HENERY, male   DOB: 28-Mar-1935, 78 y.o.   MRN: [id number]  Subjective/Complaints: Some R hip pain last noc but mild Slept well.  A 12 point review of systems has been performed and if not noted above is otherwise negative.   Objective: Vital Signs: Blood pressure 149/74, pulse 57, temperature 98 F (36.7 C), temperature source Oral, resp. rate 16, height 5\' 7"  (1.702 m), weight 93.1 kg (205 lb 4 oz), SpO2 98.00%. No results found. No results found for this basename: WBC, HGB, HCT, PLT,  in the last 72 hours No results found for this basename: NA, K, CL, CO, GLUCOSE, BUN, CREATININE, CALCIUM,  in the last 72 hours CBG (last 3)   Recent Labs  11/04/12 1656 11/04/12 2118 11/05/12 0727  GLUCAP 147* 184* 210*    Wt Readings from Last 3 Encounters:  11/02/12 93.1 kg (205 lb 4 oz)  10/25/12 92.1 kg (203 lb 0.7 oz)  10/25/12 92.1 kg (203 lb 0.7 oz)    Physical Exam:  Constitutional: He is oriented to person, place, and time. He appears well-developed and well-nourished.   Marland Kitchen HENT:  Head: Normocephalic and atraumatic.  Right Ear: External ear normal.  Left Ear: External ear normal.  Eyes: Conjunctivae and EOM are normal. Pupils are equal, round, and reactive to light. Right eye exhibits no discharge. Left eye exhibits no discharge. No scleral icterus.  Dysconjugate gaze. Arcus senilis. Pinpoint pupils.  Neck: Neck supple. No JVD present. No thyromegaly present.  Limited due to posterior neck incision.  Cardiovascular: Normal rate and regular rhythm. Exam reveals no gallop and no friction rub.  No murmur heard.  Pulmonary/Chest: Effort normal and breath sounds normal. No respiratory distress. He has no wheezes. He has no rales. He exhibits no tenderness.  Abdominal: Soft. Bowel sounds are normal. He exhibits no distension. There is no tenderness. There is no rebound.  Lymphadenopathy:  He has no cervical adenopathy.  Neurological: He is alert and oriented to  person, place, and time.  Moderate to severe dysarthria     Occasional perseverative.   Visual deficits in L>R peripheral fields, dysconjugate gaze. Mild right sided weakness with ataxia RUE and decrease in motor control RLE with ataxia as well.  . Strength remains grossly 3+ to 4/5 in all 4 limbs.  Skin: Skin is warm and dry.  Musculoskeletal: decreased pain in greater trochs and surrounding ER's with palpation/pressure   Assessment/Plan: 1. 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 Physiatrist is providing close team supervision and 24 hour management of active medical problems listed below. Physiatrist and rehab team continue to assess barriers to discharge/monitor patient progress toward functional and medical goals. FIM: FIM - Bathing Bathing Steps Patient Completed: Chest;Right Arm;Buttocks;Front perineal area;Right lower leg (including foot);Left lower leg (including foot);Right upper leg;Left Arm;Abdomen;Left upper leg Bathing: 5: Set-up assist to: Adjust water temp  FIM - Upper Body Dressing/Undressing Upper body dressing/undressing steps patient completed: Thread/unthread right sleeve of pullover shirt/dresss;Thread/unthread left sleeve of pullover shirt/dress;Put head through opening of pull over shirt/dress;Pull shirt over trunk Upper body dressing/undressing: 5: Set-up assist to: Obtain clothing/put away FIM - Lower Body Dressing/Undressing Lower body dressing/undressing steps patient completed: Pull underwear up/down;Thread/unthread right pants leg;Thread/unthread left pants leg;Pull pants up/down;Don/Doff right shoe;Don/Doff left shoe;Thread/unthread right underwear leg;Thread/unthread left underwear leg;Fasten/unfasten pants;Fasten/unfasten right shoe;Fasten/unfasten left shoe Lower body dressing/undressing:  4: Steadying  Assist  FIM - Toileting Toileting steps completed by patient: Adjust clothing prior to toileting;Performs perineal hygiene;Adjust clothing after toileting Toileting Assistive Devices: Grab bar or rail for support Toileting: 4: Steadying assist  FIM - Diplomatic Services operational officer Devices:  (stood at toilet with steady A) Toilet Transfers: 3-To toilet/BSC: Mod A (lift or lower assist);3-From toilet/BSC: Mod A (lift or lower assist)  FIM - Bed/Chair Transfer Bed/Chair Transfer Assistive Devices: Manufacturing systems engineer Transfer: 4: Supine > Sit: Min A (steadying Pt. > 75%/lift 1 leg);4: Sit > Supine: Min A (steadying pt. > 75%/lift 1 leg)  FIM - Locomotion: Wheelchair Distance: 150' Locomotion: Wheelchair: 5: Travels 150 ft or more: maneuvers on rugs and over door sills with supervision, cueing or coaxing FIM - Locomotion: Ambulation Ambulation/Gait Assistance: 2: Max assist Locomotion: Ambulation: 4: Travels 150 ft or more with minimal assistance (Pt.>75%)  Comprehension Comprehension Mode: Auditory Comprehension: 5-Follows basic conversation/direction: With extra time/assistive device  Expression Expression Mode: Verbal Expression Assistive Devices: 6-Communication board Expression: 5-Expresses basic needs/ideas: With no assist  Social Interaction Social Interaction: 4-Interacts appropriately 75 - 89% of the time - Needs redirection for appropriate language or to initiate interaction.  Problem Solving Problem Solving: 3-Solves basic 50 - 74% of the time/requires cueing 25 - 49% of the time  Memory Memory Mode: Not assessed Memory: 3-Recognizes or recalls 50 - 74% of the time/requires cueing 25 - 49% of the time  Medical Problem List and Plan:  1. DVT Prophylaxis/Anticoagulation: Mechanical: Sequential compression devices, below knee Bilateral lower extremities  2. Headaches/pain: hips feeling better. Tend to get a little sore at end of day  -  trochanteric bursitis bilaterally  -lumbar xrays unremarkable  -ice/voltaren gel. Continue to work on ROM, avoid sleeping on sides 3. Mood: Seems to be in good spirits.  -in good spirits.  4. Neuropsych: This patient is capable of making decisions on his/her own behalf.  5. HTN: Will monitor with BID checks. Starting to trend upwards with increase in activity. May need additional agent. Continue Lotensin. Monitor renal status as on Nectar liquids.  6. DM type 2: Was on 70/30 insulin additionally at home-Hgb A1C @ 6.7. Monitor BS with AC/HS cbg checks. Continue metformin and glipizide--increased glipizide to bid.  May need further titration.  -dietary ed 7. 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 Continue thiamine.  8. Hypokalemia:  Potassium replacement 9. Dysphagia: Needs supervision for meals. Encourage fluids  -diet upgraded  LOS (Days) 8 A FACE TO FACE EVALUATION WAS PERFORMED  Erick Colace 11/05/2012 8:13 AM

## 2012-11-05 NOTE — Progress Notes (Signed)
Occupational Therapy Note  Patient Details  Name: MAYSEN SUDOL MRN: [id number] Date of Birth: 12-29-1934 Today's Date: 11/05/2012  Session 1 Time: 1130-1145 (cotx with Speech Therapy- total time 1130-1200) No complaints of pain. Group therapy  Pt participated in self feeding group with focus on BUE use for self feeding and set up; task initiation, and swallowing strategies. Pt required min verbal cues for task initiation and swallowing strategies.  Pt had one episode of coughing when chewing food. Pt declined to finish eating after episode. Pt was returned to room and requested to lay down. Pt was instructed that he needed to sit up 30 minutes after meals before laying down.      Limmie Patricia, OTR/L,CBIS   11/05/2012, 12:33 PM

## 2012-11-05 NOTE — Progress Notes (Signed)
Occupational Therapy Session Note  [name] MRN: [id number] Date of Birth: [birth year]  Today's Date: 11/05/2012 Time: 8295-6213 Time Calculation (min): 30 min  Short Term Goals: Week 1:  OT Short Term Goal 1 (Week 1): Pt.  will groom self with SBA OT Short Term Goal 2 (Week 1): Pt. will bath self with minimal OT Short Term Goal 3 (Week 1): Pt. will dress LB with minimal assist OT Short Term Goal 4 (Week 1): Pt. will transfer to toilet with moderatel assist OT Short Term Goal 5 (Week 1): Pt. will perform pericare with minimal assist Week 2:     Skilled Therapeutic Interventions/Progress Updates:    Engaged in functional mobility, tub transfers using tub shower in ADL apartment.  Addressed functional ambulation with RW with min cues for upright posture, tub bench transfers, functional turns with RW, standing balance with and without UE support.  Placed pt at nursing station at end of session with safety belt on.     Therapy Documentation Precautions:  Precautions Precautions: Fall Restrictions Weight Bearing Restrictions: No      Pain: Pain Assessment Pain Assessment: 0-10 Pain Score: Asleep Faces Pain Scale: No hurt Pain Type: Acute pain Pain Location: Hip Pain Orientation: Right;Left Pain Descriptors: Sore Pain Onset: On-going Pain Intervention(s): Medication (See eMAR) ADL:     See FIM for current functional status  Therapy/Group: Individual Therapy  Humberto Seals 11/05/2012, 2:51 PM

## 2012-11-05 NOTE — Progress Notes (Signed)
Speech Language Pathology Daily Session Note  [name] MRN: [id number] Date of Birth: Jul 09, 1935  Today's Date: 11/05/2012 Time: 1145-1200 Time Calculation (min): 15 min  Short Term Goals: Week 1: SLP Short Term Goal 1 (Week 1): Pt will utilize swallowing compensatory strategies to minimize overt s/s of aspiration with Min A verbal cues.  SLP Short Term Goal 2 (Week 1): Patient will select attention to basic and familiar tasks with minimum miultimodal cues.  SLP Short Term Goal 3 (Week 1): Patient will demonstrate mildly complex problem solving abilities during functional tasks with minimal verbal cues.  SLP Short Term Goal 4 (Week 1): Patient will utilize external memory aids to faciliate retrieval of basic daily information with minimal verbal cues. SLP Short Term Goal 5 (Week 1): Patient will utilize compensatory strategies to increase intelligible speech, with minimal verbal and visual cues.   SLP Short Term Goal 6 (Week 1): pt will consume trials of thin liquids without overt s/s of aspiration with supervision verbal cues with 100% of trials.   Skilled Therapeutic Interventions: Pt participated in co-treatment with OT in din[employer] club with focus on dysphagia goals and self-feeding. Pt required min verbal cues for task initiation and for utilization of swallowing  compensatory strategies. Pt had one episode of coughing, suspect to large bites and pharyngeal residue with textures (Dys 3). Pt declined to finish eating after episode. Pt was returned to room and requested to lay down. Pt was instructed that he needed to sit up 30 minutes after meals before laying down. Recommend to continue with current diet of Dys. 2 textures with nectar-thick liquids.    FIM:  FIM - Eating Eating Activity: 5: Supervision/cues  Pain No/Denies Pain  Therapy/Group: Group Therapy  Jayel Scaduto 11/05/2012, 1:47 PM

## 2012-11-06 ENCOUNTER — Inpatient Hospital Stay (HOSPITAL_COMMUNITY): Payer: Medicare Other | Admitting: Occupational Therapy

## 2012-11-06 LAB — GLUCOSE, CAPILLARY
Glucose-Capillary: 124 mg/dL — ABNORMAL HIGH (ref 70–99)
Glucose-Capillary: 142 mg/dL — ABNORMAL HIGH (ref 70–99)
Glucose-Capillary: 170 mg/dL — ABNORMAL HIGH (ref 70–99)
Glucose-Capillary: 151 mg/dL — ABNORMAL HIGH (ref 70–99)

## 2012-11-06 NOTE — Progress Notes (Signed)
Patient ID: Bill Jackson, male   DOB: June 04, 1935, 78 y.o.   MRN: [id number]  Subjective/Complaints: No pain c/os A 12 point review of systems has been performed and if not noted above is otherwise negative.   Objective: Vital Signs: Blood pressure 149/82, pulse 52, temperature 98.2 F (36.8 C), temperature source Oral, resp. rate 18, height 5\' 7"  (1.702 m), weight 93.1 kg (205 lb 4 oz), SpO2 95.00%. No results found. No results found for this basename: WBC, HGB, HCT, PLT,  in the last 72 hours No results found for this basename: NA, K, CL, CO, GLUCOSE, BUN, CREATININE, CALCIUM,  in the last 72 hours CBG (last 3)   Recent Labs  11/04/12 2118 11/05/12 0727 11/05/12 1134  GLUCAP 184* 210* 158*    Wt Readings from Last 3 Encounters:  11/02/12 93.1 kg (205 lb 4 oz)  10/25/12 92.1 kg (203 lb 0.7 oz)  10/25/12 92.1 kg (203 lb 0.7 oz)    Physical Exam:  Constitutional: He is oriented to person, place, and time. He appears well-developed and well-nourished.   Marland Kitchen HENT:  Head: Normocephalic and atraumatic.  Right Ear: External ear normal.  Left Ear: External ear normal.  Eyes: Conjunctivae and EOM are normal. Pupils are equal, round, and reactive to light. Right eye exhibits no discharge. Left eye exhibits no discharge. No scleral icterus.  Dysconjugate gaze. Arcus senilis. Pinpoint pupils.  Neck: Neck supple. No JVD present. No thyromegaly present.  Limited due to posterior neck incision.  Cardiovascular: Normal rate and regular rhythm. Exam reveals no gallop and no friction rub.  No murmur heard.  Pulmonary/Chest: Effort normal and breath sounds normal. No respiratory distress. He has no wheezes. He has no rales. He exhibits no tenderness.  Abdominal: Soft. Bowel sounds are normal. He exhibits no distension. There is no tenderness. There is no rebound.  Lymphadenopathy:  He has no cervical adenopathy.  Neurological: He is alert and oriented to person, place, and time.  Moderate  to severe dysarthria     Occasional perseverative.   Visual deficits in L>R peripheral fields, dysconjugate gaze. Mild right sided weakness with ataxia RUE and decrease in motor control RLE with ataxia as well.  . Strength remains grossly 3+ to 4/5 in all 4 limbs.  Skin: Skin is warm and dry.  Musculoskeletal: decreased pain in greater trochs and surrounding ER's with palpation/pressure   Assessment/Plan: 1. 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 Physiatrist is providing close team supervision and 24 hour management of active medical problems listed below. Physiatrist and rehab team continue to assess barriers to discharge/monitor patient progress toward functional and medical goals. FIM: FIM - Bathing Bathing Steps Patient Completed: Chest;Right Arm;Buttocks;Front perineal area;Right lower leg (including foot);Left lower leg (including foot);Right upper leg;Left Arm;Abdomen;Left upper leg Bathing: 5: Set-up assist to: Adjust water temp  FIM - Upper Body Dressing/Undressing Upper body dressing/undressing steps patient completed: Thread/unthread right sleeve of pullover shirt/dresss;Thread/unthread left sleeve of pullover shirt/dress;Put head through opening of pull over shirt/dress;Pull shirt over trunk Upper body dressing/undressing: 5: Set-up assist to: Obtain clothing/put away FIM - Lower Body Dressing/Undressing Lower body dressing/undressing steps patient completed: Pull underwear up/down;Thread/unthread right pants leg;Thread/unthread left pants leg;Pull pants up/down;Don/Doff right shoe;Don/Doff left shoe;Thread/unthread right underwear leg;Thread/unthread left underwear leg;Fasten/unfasten pants;Fasten/unfasten right shoe;Fasten/unfasten left shoe Lower body dressing/undressing: 4: Steadying Assist  FIM -  Toileting Toileting steps completed  by patient: Adjust clothing prior to toileting;Performs perineal hygiene;Adjust clothing after toileting Toileting Assistive Devices: Grab bar or rail for support Toileting: 4: Steadying assist  FIM - Diplomatic Services operational officer Devices:  (stood at toilet with steady A) Toilet Transfers: 3-To toilet/BSC: Mod A (lift or lower assist);3-From toilet/BSC: Mod A (lift or lower assist)  FIM - Bed/Chair Transfer Bed/Chair Transfer Assistive Devices: Bed rails;Arm rests Bed/Chair Transfer: 4: Sit > Supine: Min A (steadying pt. > 75%/lift 1 leg);4: Bed > Chair or W/C: Min A (steadying Pt. > 75%)  FIM - Locomotion: Wheelchair Distance: 150' Locomotion: Wheelchair: 5: Travels 150 ft or more: maneuvers on rugs and over door sills with supervision, cueing or coaxing FIM - Locomotion: Ambulation Ambulation/Gait Assistance: 2: Max assist Locomotion: Ambulation: 4: Travels 150 ft or more with minimal assistance (Pt.>75%)  Comprehension Comprehension Mode: Auditory Comprehension: 5-Understands complex 90% of the time/Cues < 10% of the time  Expression Expression Mode: Verbal Expression Assistive Devices: 6-Communication board Expression: 5-Expresses basic needs/ideas: With no assist  Social Interaction Social Interaction: 4-Interacts appropriately 75 - 89% of the time - Needs redirection for appropriate language or to initiate interaction.  Problem Solving Problem Solving: 3-Solves basic 50 - 74% of the time/requires cueing 25 - 49% of the time  Memory Memory Mode: Not assessed Memory: 3-Recognizes or recalls 50 - 74% of the time/requires cueing 25 - 49% of the time  Medical Problem List and Plan:  1. DVT Prophylaxis/Anticoagulation: Mechanical: Sequential compression devices, below knee Bilateral lower extremities  2. Headaches/pain: hips feeling better. Tend to get a little sore at end of day  - trochanteric bursitis bilaterally  -lumbar  xrays unremarkable  -ice/voltaren gel. Continue to work on ROM, avoid sleeping on sides 3. Mood: Seems to be in good spirits.  -in good spirits.  4. Neuropsych: This patient is capable of making decisions on his/her own behalf.  5. HTN: Will monitor with BID checks. Starting to trend upwards with increase in activity. May need additional agent. Continue Lotensin. Monitor renal status as on Nectar liquids.  6. DM type 2: Was on 70/30 insulin additionally at home-Hgb A1C @ 6.7. Monitor BS with AC/HS cbg checks. Continue metformin and glipizide--increased glipizide to bid.  May need further titration.  -dietary ed 7. 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 Continue thiamine.  8. Hypokalemia:  Potassium replacement 9. Dysphagia: Needs supervision for meals. Encourage fluids  -diet upgraded  LOS (Days) 9 A FACE TO FACE EVALUATION WAS PERFORMED  Erick Colace 11/06/2012 7:56 AM

## 2012-11-06 NOTE — Progress Notes (Signed)
Occupational Therapy Session Note  [name] MRN: [id number] Date of Birth: 1935/03/26  Today's Date: 11/06/2012 Time: 0815-0930 Time Calculation (min): 75 min  Skilled Therapeutic Interventions/Progress Updates: Shower in apt tub/shower via tub transfer bench with focus on balance with transfers especially when turning head away from midline.  Patient with 2 episodes of complaints of dizziness - once when looking up and other after standing in shower looking midline for about 2 minutes.     Therapy Documentation Precautions:  Precautions Precautions: Fall Restrictions Weight Bearing Restrictions: No  Pain:7/10 bilateral hips and low back.  Scheduled Pain meds given approx 2 hours prior See FIM for current functional status  Therapy/Group: Individual Therapy  Bud Face [facility] 11/06/2012, 4:06 PM

## 2012-11-07 ENCOUNTER — Inpatient Hospital Stay (HOSPITAL_COMMUNITY): Payer: Medicare Other | Admitting: Speech Pathology

## 2012-11-07 ENCOUNTER — Inpatient Hospital Stay (HOSPITAL_COMMUNITY): Payer: Medicare Other | Admitting: Occupational Therapy

## 2012-11-07 ENCOUNTER — Inpatient Hospital Stay (HOSPITAL_COMMUNITY): Payer: Medicare Other | Admitting: Physical Therapy

## 2012-11-07 LAB — BASIC METABOLIC PANEL
BUN: 14 mg/dL (ref 6–23)
CO2: 32 mEq/L (ref 19–32)
Calcium: 8.8 mg/dL (ref 8.4–10.5)
Creatinine, Ser: 1.15 mg/dL (ref 0.50–1.35)
Glucose, Bld: 167 mg/dL — ABNORMAL HIGH (ref 70–99)

## 2012-11-07 LAB — GLUCOSE, CAPILLARY
Glucose-Capillary: 142 mg/dL — ABNORMAL HIGH (ref 70–99)
Glucose-Capillary: 128 mg/dL — ABNORMAL HIGH (ref 70–99)
Glucose-Capillary: 113 mg/dL — ABNORMAL HIGH (ref 70–99)

## 2012-11-07 MED ORDER — GLIPIZIDE 5 MG PO TABS
15.0000 mg | ORAL_TABLET | Freq: Two times a day (BID) | ORAL | Status: DC
  Administered 2012-11-08 – 2012-11-10 (×5): 15 mg via ORAL
  Filled 2012-11-07 (×8): qty 1

## 2012-11-07 NOTE — Progress Notes (Signed)
Physical Therapy Note  [name] MRN: [id number] Date of Birth: 07-21-1935 Today's Date: 11/07/2012  1300-1355 (55 minutes) individual Pain: 9/10 back/ premedicated Focus of treatment: Therapeutic exercise focused on activity tolerance/ bilateral LE coordination ; gait training Treatment: Transfer tactile cues to remove legrests; min assist stand/pivot RW; Nustep Level 4 X 5 minutes before c/o of 6/10 nausea and request to use bathroom; sit to stand from wc with max tactile cues for hand placement on armrests to stand; gait to bathroom RW min assist with vcs for safe placement of RW; nurse notified concerning nausea/ given ginger ale; gait 50 feet X 3 RW min assist with decreasing c/o nausea; pt returned to bed with alarm activated.    Jannessa Ogden,JIM 11/07/2012, 1:14 PM

## 2012-11-07 NOTE — Progress Notes (Signed)
Occupational Therapy Note  [name] MRN: [id number] Date of Birth: 1935/07/05 Today's Date: 11/07/2012 Time: 1145-1205 (20 min)  Pt seen for self-feeding group session with SLP with focus on BUE use for self-feeding and setup, task initiation, swallowing strategies, and attention to task.  Pt with min verbal cues for swallowing strategies and encouragement for PO intake.  Leonette Monarch 11/07/2012, 12:28 PM

## 2012-11-07 NOTE — Progress Notes (Signed)
Patient ID: Bill Jackson, male   DOB: 07/01/1935, 78 y.o.   MRN: [id number]  Subjective/Complaints: No pain c/os A 12 point review of systems has been performed and if not noted above is otherwise negative.   Objective: Vital Signs: Blood pressure 162/77, pulse 55, temperature 98.3 F (36.8 C), temperature source Oral, resp. rate 19, height 5\' 7"  (1.702 m), weight 93.1 kg (205 lb 4 oz), SpO2 100.00%. No results found. No results found for this basename: WBC, HGB, HCT, PLT,  in the last 72 hours  Recent Labs  11/07/12 0555  NA 136  K 4.0  CL 97  GLUCOSE 167*  BUN 14  CREATININE 1.15  CALCIUM 8.8   CBG (last 3)   Recent Labs  11/06/12 1204 11/06/12 1635 11/06/12 2125  GLUCAP 170* 151* 181*    Wt Readings from Last 3 Encounters:  11/02/12 93.1 kg (205 lb 4 oz)  10/25/12 92.1 kg (203 lb 0.7 oz)  10/25/12 92.1 kg (203 lb 0.7 oz)    Physical Exam:  Constitutional: He is oriented to person, place, and time. He appears well-developed and well-nourished.   Marland Kitchen HENT:  Head: Normocephalic and atraumatic.  Right Ear: External ear normal.  Left Ear: External ear normal.  Eyes: Conjunctivae and EOM are normal. Pupils are equal, round, and reactive to light. Right eye exhibits no discharge. Left eye exhibits no discharge. No scleral icterus.  Dysconjugate gaze. Arcus senilis. Pinpoint pupils.  Neck: Neck supple. No JVD present. No thyromegaly present.  Limited due to posterior neck incision.  Cardiovascular: Normal rate and regular rhythm. Exam reveals no gallop and no friction rub.  No murmur heard.  Pulmonary/Chest: Effort normal and breath sounds normal. No respiratory distress. He has no wheezes. He has no rales. He exhibits no tenderness.  Abdominal: Soft. Bowel sounds are normal. He exhibits no distension. There is no tenderness. There is no rebound.  Lymphadenopathy:  He has no cervical adenopathy.  Neurological: He is alert and oriented to person, place, and time.   Moderate to severe dysarthria     Occasional perseverative.   Visual deficits in L>R peripheral fields, dysconjugate gaze. Mild right sided weakness with ataxia RUE and decrease in motor control RLE with ataxia as well.  . Strength remains grossly 3+ to 4/5 in all 4 limbs.  Skin: Skin is warm and dry.  Musculoskeletal: decreased pain in greater trochs and surrounding ER's with palpation/pressure   Assessment/Plan: 1. 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 Physiatrist is providing close team supervision and 24 hour management of active medical problems listed below. Physiatrist and rehab team continue to assess barriers to discharge/monitor patient progress toward functional and medical goals. FIM: FIM - Bathing Bathing Steps Patient Completed: Chest;Right Arm;Left Arm;Abdomen;Front perineal area;Buttocks;Right upper leg;Left upper leg;Right lower leg (including foot);Left lower leg (including foot) Bathing: 5: Set-up assist to: Adjust water temp  FIM - Upper Body Dressing/Undressing Upper body dressing/undressing steps patient completed: Thread/unthread right sleeve of pullover shirt/dresss;Thread/unthread left sleeve of pullover shirt/dress;Put head through opening of pull over shirt/dress;Pull shirt over trunk Upper body dressing/undressing: 5: Set-up assist to: Obtain clothing/put away FIM - Lower Body Dressing/Undressing Lower body dressing/undressing steps patient completed: Thread/unthread right pants leg;Pull pants up/down;Fasten/unfasten pants;Don/Doff right sock;Don/Doff left sock;Thread/unthread left pants leg;Don/Doff right shoe;Don/Doff left shoe;Fasten/unfasten right shoe;Fasten/unfasten left shoe Lower body dressing/undressing: 4: Steadying Assist  FIM -  Toileting Toileting steps completed by patient:  Adjust clothing prior to toileting;Performs perineal hygiene;Adjust clothing after toileting Toileting Assistive Devices: Grab bar or rail for support Toileting: 4: Steadying assist  FIM - Diplomatic Services operational officer Devices:  (stood at toilet with steady A) Toilet Transfers: 3-To toilet/BSC: Mod A (lift or lower assist);3-From toilet/BSC: Mod A (lift or lower assist)  FIM - Bed/Chair Transfer Bed/Chair Transfer Assistive Devices: Bed rails;Arm rests Bed/Chair Transfer: 4: Bed > Chair or W/C: Min A (steadying Pt. > 75%);4: Chair or W/C > Bed: Min A (steadying Pt. > 75%)  FIM - Locomotion: Wheelchair Distance: 150' Locomotion: Wheelchair: 5: Travels 150 ft or more: maneuvers on rugs and over door sills with supervision, cueing or coaxing FIM - Locomotion: Ambulation Ambulation/Gait Assistance: 2: Max assist Locomotion: Ambulation: 4: Travels 150 ft or more with minimal assistance (Pt.>75%)  Comprehension Comprehension Mode: Auditory Comprehension: 5-Understands complex 90% of the time/Cues < 10% of the time  Expression Expression Mode: Verbal Expression Assistive Devices: 6-Communication board Expression: 5-Expresses basic needs/ideas: With no assist  Social Interaction Social Interaction: 4-Interacts appropriately 75 - 89% of the time - Needs redirection for appropriate language or to initiate interaction.  Problem Solving Problem Solving: 3-Solves basic 50 - 74% of the time/requires cueing 25 - 49% of the time  Memory Memory Mode: Not assessed Memory: 3-Recognizes or recalls 50 - 74% of the time/requires cueing 25 - 49% of the time  Medical Problem List and Plan:  1. DVT Prophylaxis/Anticoagulation: Mechanical: Sequential compression devices, below knee Bilateral lower extremities  2. Headaches/pain: hips continue to show improvement. Tend to get a little sore at end of day  - trochanteric bursitis bilaterally  -lumbar xrays unremarkable  -ice/voltaren gel.  Continue to work on ROM, avoid sleeping on sides 3. Mood: Seems to be in good spirits.  -in good spirits.  4. Neuropsych: This patient is capable of making decisions on his/her own behalf.  5. HTN: Will monitor with BID checks. Starting to trend upwards with increase in activity. May need additional agent. Continue Lotensin. Monitor renal status as on Nectar liquids.  6. DM type 2: Was on 70/30 insulin additionally at home-Hgb A1C @ 6.7. Monitor BS with AC/HS cbg checks. Continue metformin and glipizide--increased glipizide to bid- will titrate to 15mg  bid  -dietary ed 7. 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 Continue thiamine.  8. Hypokalemia:  Potassium replacement 9. Dysphagia: Needs supervision for meals. Encourage fluids  -diet upgraded  LOS (Days) 10 A FACE TO FACE EVALUATION WAS PERFORMED  Mack Thurmon T 11/07/2012 7:53 AM

## 2012-11-07 NOTE — Progress Notes (Signed)
Occupational Therapy Weekly Progress Note  [name] MRN: [id number] Date of Birth: Nov 25, 1934  Today's Date: 11/07/2012 Time: 4098-1191 Time Calculation (min): 45 min  1:1 self care retraining at shower level with focus on bed mobility, functional ambulation with RW, RW safety with min cuing to remain inside the walker, sit to stands with recall of proper hand placement, standing balance without UE support, d/c planning discussion about still requiring supervision. Pt still reports dizziness with positional changes- tried to use gaze stabilization techniques however pt unable to follow command to perform so unsuccessful attempt.   Patient has met 5 of 5 short term goals.  Pt continues to make good gains meeting all STG this period. Pt can perform UB bathing and dressing with supervision/ setup and LB bathing and dressing with steady A. Pt can perform functional ambulation around room with min A with RW. Pt continues to have a lean to the right requiring min A to maintain balance and verbal cues to remain inside the RW. Pt continues to report dizziness with positional changes at times but has difficulty with compensation techniques.  Patient continues to demonstrate the following deficits: muscle weakness, decreased cardiorespiratoy endurance, impaired timing and sequencing, unbalanced muscle activation and decreased coordination, decreased visual perceptual skills, dizziness with positional changes and decreased standing balance, decreased postural control, decreased balance strategies and difficulty maintaining precautions and therefore will continue to benefit from skilled OT intervention to enhance overall performance with BADL and Reduce care partner burden.  Patient progressing toward long term goals..  Continue plan of care.  OT Short Term Goals Week 1:  OT Short Term Goal 1 (Week 1): Pt.  will groom self with SBA OT Short Term Goal 1 - Progress (Week 1): Met OT  Short Term Goal 2 (Week 1): Pt. will bath self with minimal OT Short Term Goal 2 - Progress (Week 1): Met OT Short Term Goal 3 (Week 1): Pt. will dress LB with minimal assist OT Short Term Goal 3 - Progress (Week 1): Met OT Short Term Goal 4 (Week 1): Pt. will transfer to toilet with moderatel assist OT Short Term Goal 4 - Progress (Week 1): Met OT Short Term Goal 5 (Week 1): Pt. will perform pericare with minimal assist OT Short Term Goal 5 - Progress (Week 1): Met Week 2:  OT Short Term Goal 1 (Week 2): STG=LTG  Skilled Therapeutic Interventions/Progress Updates:    continue with POC  Therapy Documentation Precautions:  Precautions Precautions: Fall Restrictions Weight Bearing Restrictions: No Pain: Pain Assessment Pain Assessment: 0-10 Pain Score:   3 Pain Type: Acute pain Pain Location: Back Pain Orientation: Lower Pain Descriptors: Aching;Sore Pain Frequency: Occasional Pain Onset: Gradual Patients Stated Pain Goal: 3 Pain Intervention(s): Medication (See eMAR) (ultram 50mg  po)  See FIM for current functional status  Therapy/Group: Individual Therapy  Roney Mans [facility] 11/07/2012, 11:14 AM

## 2012-11-07 NOTE — Progress Notes (Signed)
Order received received to discontinue  staples in left parietal scalp area and sutures to posterior neck. RN removed over half of sutures to posterior neck and patient yelled out he can not sit up any longer. Less than 3 sutures left to remove and patient refused to allow RN to remove remaining staples. Attempted to provide rationale for continuing procedure and patient yelled at RN "I said no" assisted patient in side lying position and attempted to review with patient to allow RN to remove remaining staples and patient refused . Upon leaving room patient started vomiting after coughing up thick mucus. Small amount of emesis noted . Patient allowed RN to assist after vomiting episode but refused any other care. Patient head of bed up 35 degrees with eyes closed. Continue with plan of care.      Cleotilde Neer

## 2012-11-07 NOTE — Progress Notes (Signed)
Speech Language Pathology Weekly Progress & Session Notes  [name] DOBIE MRN: [id number] Date of Birth: 1935-03-25  Today's Date: 11/07/2012  Session 1 Time: 4696-2952 Time Calculation: 55 min  Session 2 Time: 1130-1145 Time Calculation (min): 15 min  Short Term Goals: Week 1: SLP Short Term Goal 1 (Week 1): Pt will utilize swallowing compensatory strategies to minimize overt s/s of aspiration with Min A verbal cues.  SLP Short Term Goal 1 - Progress (Week 1): Met SLP Short Term Goal 2 (Week 1): Patient will select attention to basic and familiar tasks with minimum miultimodal cues.  SLP Short Term Goal 2 - Progress (Week 1): Met SLP Short Term Goal 3 (Week 1): Patient will demonstrate mildly complex problem solving abilities during functional tasks with minimal verbal cues.  SLP Short Term Goal 3 - Progress (Week 1): Not met SLP Short Term Goal 4 (Week 1): Patient will utilize external memory aids to faciliate retrieval of basic daily information with minimal verbal cues. SLP Short Term Goal 4 - Progress (Week 1): Met SLP Short Term Goal 5 (Week 1): Patient will utilize compensatory strategies to increase intelligible speech, with minimal verbal and visual cues.   SLP Short Term Goal 5 - Progress (Week 1): Met SLP Short Term Goal 6 (Week 1): pt will consume trials of thin liquids without overt s/s of aspiration with supervision verbal cues with 100% of trials.  SLP Short Term Goal 6 - Progress (Week 1): Met  New Short Term Goals:  Week 2: SLP Short Term Goal 1 (Week 2): Pt will consume trials of Dys. 3 textures and demonstrate efficient mastication with supervision verbal cues wituout overt s/s of aspiration.  SLP Short Term Goal 2 (Week 2): Pt will consume trials of thin liquids without overt s/s of aspiration with Mod I SLP Short Term Goal 3 (Week 2): Pt will utilize swallowing compensatory strategies with supervision verbal cues to minimize overt s/s of  aspiration SLP Short Term Goal 4 (Week 2): Pt will utilize speech intelligibility strategies at the phrase level with supervision verbal cues for 90% intelligibility SLP Short Term Goal 5 (Week 2): Pt will demonstrate functional problem solving for mildly complex tasks with Min A verbal cues  SLP Short Term Goal 6 (Week 2): Pt will utilize external memory aids to recall new, daily information with Mod I.   Weekly Progress Updates: Pt has made functional gains and has met 5 of 6 STG's this reporting period. Currently, pt is consuming current diet of Dys. 2 textures with nectar-thick liquids with intermittent overt s/s of aspiration and requires Min A verbal and question cues for utilization of swallowing compensatory strategies.  Pt demonstrates difficulty with efficient mastication of Dys. 3 textures but is consuming trials of thin liquids without overt s/s of aspiration. Pt has also made gains in his functional communication and requires Min-Mod verbal cues for utilization of speech intelligibility strategies at the word to short phrase level. Pt also continues to demonstrate decreased recall of newly learned information and requires supervision verbal cues for utilization of swallowing compensatory strategies and Min A verbal cues for functional problem solving. Pt would benefit from continued skilled SLP intervention to maximize cognitive recovery, functional communication and swallowing function with least restrictive diet.    SLP Intensity: Minumum of 1-2 x/day, 30 to 90 minutes SLP Frequency: 5 out of 7 days SLP Duration/Estimated Length of Stay: 1 week SLP Treatment/Interventions: Cognitive remediation/compensation;Cueing hierarchy;Dysphagia/aspiration precaution training;Functional tasks;Environmental controls;Internal/external aids;Speech/Language  facilitation;Therapeutic Activities;Patient/family education  Daily Session Session 1: Treatment focus on dysphagia and speech goals. Pt is  currently on a Dys. 2 diet but received Dys. 3 textures. Pt was unable to efficiently masticate and had to expectorate sausage from his oral cavity. Pt also consumed thin liquids throughout the meal and demonstrated an intermittent throat clear. Pt required Min A verbal cues for utilization of swallowing compensatory strategies and proper positioning for PO intake. Pt was ~90% intelligible at the phrase and sentence level and required Min A verbal cues for utilization of speech intelligibility strategies.  Session 2: Pt participated in co-treatment with OT in diners club with focus on dysphagia and speech goals and self-feeding. Pt was unable to masticate chopped meat efficiently due to "dryness." Pt consumed thin liquids throughout the meal without overt s/s of aspiration and required Min verbal cues for utilization of swallowing compensatory strategies. Pt also required Min verbal cues for utilization of speech intelligibility strategies for functional communication with other group members.  FIM:  Comprehension Comprehension Mode: Auditory Comprehension: 5-Follows basic conversation/direction: With extra time/assistive device Expression Expression Mode: Verbal Expression: 4-Expresses basic 75 - 89% of the time/requires cueing 10 - 24% of the time. Needs helper to occlude trach/needs to repeat words. Social Interaction Social Interaction: 5-Interacts appropriately 90% of the time - Needs monitoring or encouragement for participation or interaction. Problem Solving Problem Solving: 4-Solves basic 75 - 89% of the time/requires cueing 10 - 24% of the time Memory Memory: 5-Recognizes or recalls 90% of the time/requires cueing < 10% of the time FIM - Eating Eating Activity: 5: Supervision/cues Pain No/Denies Pain  Therapy/Group: Individual Therapy and Group Therapy  Telecia Larocque 11/07/2012, 4:11 PM

## 2012-11-08 ENCOUNTER — Inpatient Hospital Stay (HOSPITAL_COMMUNITY): Payer: Medicare Other | Admitting: Speech Pathology

## 2012-11-08 ENCOUNTER — Inpatient Hospital Stay (HOSPITAL_COMMUNITY): Payer: Medicare Other | Admitting: Physical Therapy

## 2012-11-08 ENCOUNTER — Inpatient Hospital Stay (HOSPITAL_COMMUNITY): Payer: Medicare Other

## 2012-11-08 ENCOUNTER — Inpatient Hospital Stay (HOSPITAL_COMMUNITY): Payer: Medicare Other | Admitting: Occupational Therapy

## 2012-11-08 LAB — URINALYSIS, ROUTINE W REFLEX MICROSCOPIC
Hgb urine dipstick: NEGATIVE
Nitrite: NEGATIVE
Protein, ur: NEGATIVE mg/dL
Specific Gravity, Urine: 1.014 (ref 1.005–1.030)
Urobilinogen, UA: 1 mg/dL (ref 0.0–1.0)

## 2012-11-08 LAB — GLUCOSE, CAPILLARY: Glucose-Capillary: 223 mg/dL — ABNORMAL HIGH (ref 70–99)

## 2012-11-08 NOTE — Progress Notes (Signed)
Physical Therapy Note  [name] MRN: [id number] Date of Birth: [birth year] Today's Date: 11/08/2012  Time: 1030-1130 60 minutes  No c/o pain, pt c/o dizziness with transitional movements, relieved with still rest. Standing NMR with focus on tap ups and step ups for controlled wt shifts and balance, pt requires mod - max A for balance without UE support.  Sit to stand training focusing on anterior graded wt shifts with min-mod A.  Gait training with RW 150' x 2 with close supervision in controlled environment, requires mod A for balance with head turns or direction changes.  Nu step for LE/UE strength and endurance x 10 minutes level 4.  Individual therapy  DONAWERTH,KAREN 11/08/2012, 12:22 PM

## 2012-11-08 NOTE — Progress Notes (Signed)
Patient refused to take the three remaining sutures to Lposterior neck.

## 2012-11-08 NOTE — Progress Notes (Signed)
Occupational Therapy Session Note  [name] MRN: [id number] Date of Birth: May 08, 1935  Today's Date: 11/08/2012 Time: 0800-0900 Time Calculation (min): 60 min  Short Term Goals: Week 2:  OT Short Term Goal 1 (Week 2): STG=LTG  Skilled Therapeutic Interventions/Progress Updates:    Pt engaged in bathing at tub shower level in ADL apartment.  Pt amb with RW to bathroom for tub bench transfer at steady A level.  Pt completed bathing tasks seated but stood to rinse off.  When walking to w/c after dressing pt had one LOB while turning to sit in w/c.  Pt required min A to correct.  Pt completed all bathing and dressing tasks at supervision level.  Focus on activity tolerance, dynamic standing balance, and safety awareness.  Therapy Documentation Precautions:  Precautions Precautions: Fall Restrictions Weight Bearing Restrictions: No   Pain: Pain Assessment Pain Assessment: No/denies pain  See FIM for current functional status  Therapy/Group: Individual Therapy  Rich Brave 11/08/2012, 9:02 AM

## 2012-11-08 NOTE — Progress Notes (Signed)
Speech Language Pathology Daily Session Notes  [name] MRN: [id number] Date of Birth: 07-04-1935  Today's Date: 11/08/2012  Session 1 Time: 8657-8469 Time Calculation (min): 30 min  Session 2 Time: 1130-1145 Time Calculation: 15 min  Short Term Goals: Week 2: SLP Short Term Goal 1 (Week 2): Pt will consume trials of Dys. 3 textures and demonstrate efficient mastication with supervision verbal cues wituout overt s/s of aspiration.  SLP Short Term Goal 2 (Week 2): Pt will consume trials of thin liquids without overt s/s of aspiration with Mod I SLP Short Term Goal 3 (Week 2): Pt will utilize swallowing compensatory strategies with supervision verbal cues to minimize overt s/s of aspiration SLP Short Term Goal 4 (Week 2): Pt will utilize speech intelligibility strategies at the phrase level with supervision verbal cues for 90% intelligibility SLP Short Term Goal 5 (Week 2): Pt will demonstrate functional problem solving for mildly complex tasks with Min A verbal cues  SLP Short Term Goal 6 (Week 2): Pt will utilize external memory aids to recall new, daily information with Mod I.   Skilled Therapeutic Interventions:  Session 1: Treatment focus on dysphagia and speech goals. Pt consumed thin liquids via cup with overt s/s of aspiration with 50% of trials and required Min verbal and question cues for utilization of swallowing compensatory strategies. Pt was ~90% intelligible at the sentence level and required supervision verbal cues for a decreased rate of speech. Pt reported he attributes his decreased PO intake to poor appetite and a "bitter" taste in his oral cavity from crushed medications. Discussed clinical reasoning with pt and ultimately decided pt can receive medications whole in puree textures.    Session 2: Pt participated in co-treatment with OT in diners club with treatment focus on speech and dysphagia goals and self-feeding. Pt consumed Dys. 2 textures  with thin liquids with cough X 2, suspect due to penetration that cleared with a spontaneous strong cough. Pt with increased social interaction with clinicians and other group members and was 90% intelligible with Min verbal cues for a slow rate at the sentence level.   FIM:  Comprehension Comprehension Mode: Auditory Comprehension: 5-Follows basic conversation/direction: With extra time/assistive device Expression Expression Mode: Verbal Expression: 5-Expresses basic 90% of the time/requires cueing < 10% of the time. Social Interaction Social Interaction: 5-Interacts appropriately 90% of the time - Needs monitoring or encouragement for participation or interaction. Problem Solving Problem Solving: 4-Solves basic 75 - 89% of the time/requires cueing 10 - 24% of the time Memory Memory: 5-Recognizes or recalls 90% of the time/requires cueing < 10% of the time FIM - Eating Eating Activity: 5: Supervision/cues  Pain Pain Assessment Pain Assessment: No/denies pain  Therapy/Group: Individual Therapy and Group Therapy  Bill Jackson 11/08/2012, 11:10 AM

## 2012-11-08 NOTE — Progress Notes (Signed)
Occupational Therapy Note  [name] MRN: [id number] Date of Birth: July 19, 1935 Today's Date: 11/08/2012  Time: 1145-1200 (15 min)  Pt seen for self-feeding group session with SLP with focus on BUE use with self-feeding and setup, task initiation, swallowing strategies, and attention to task. Pt with min verbal cues for swallowing strategies and encouragement for PO intake. Pt with minimal PO intake this session.  Pt with no c/o pain this session.  Leonette Monarch 11/08/2012, 12:00 PM

## 2012-11-08 NOTE — Progress Notes (Signed)
Patient ID: Bill Jackson, male   DOB: [birth year], 78 y.o.   MRN: [id number]  Subjective/Complaints: Refused all suture removal. Concerned that his friend didn't "bring back his car". Denies any pain or other complaints.  A 12 point review of systems has been performed and if not noted above is otherwise negative.   Objective: Vital Signs: Blood pressure 159/95, pulse 50, temperature 98.4 F (36.9 C), temperature source Oral, resp. rate 18, height 5\' 7"  (1.702 m), weight 93.1 kg (205 lb 4 oz), SpO2 92.00%. No results found. No results found for this basename: WBC, HGB, HCT, PLT,  in the last 72 hours  Recent Labs  11/07/12 0555  NA 136  K 4.0  CL 97  GLUCOSE 167*  BUN 14  CREATININE 1.15  CALCIUM 8.8   CBG (last 3)   Recent Labs  11/07/12 1620 11/07/12 2031 11/08/12 0709  GLUCAP 128* 113* 149*    Wt Readings from Last 3 Encounters:  11/02/12 93.1 kg (205 lb 4 oz)  10/25/12 92.1 kg (203 lb 0.7 oz)  10/25/12 92.1 kg (203 lb 0.7 oz)    Physical Exam:  Constitutional: He is oriented to person, place, and time. He appears well-developed and well-nourished.   Marland Kitchen HENT:  Head: Normocephalic and atraumatic.  Right Ear: External ear normal.  Left Ear: External ear normal.  Eyes: Conjunctivae and EOM are normal. Pupils are equal, round, and reactive to light. Right eye exhibits no discharge. Left eye exhibits no discharge. No scleral icterus.  Dysconjugate gaze. Arcus senilis. Pinpoint pupils.  Neck: Neck supple. No JVD present. No thyromegaly present.  Limited due to posterior neck incision.  Cardiovascular: Normal rate and regular rhythm. Exam reveals no gallop and no friction rub.  No murmur heard.  Pulmonary/Chest: Effort normal and breath sounds normal. No respiratory distress. He has no wheezes. He has no rales. He exhibits no tenderness.  Abdominal: Soft. Bowel sounds are normal. He exhibits no distension. There is no tenderness. There is no rebound.   Lymphadenopathy:  He has no cervical adenopathy.  Neurological: He is alert slightly confused.  Moderate to severe dysarthria persists.     Occasional perseverative.   Visual deficits in L>R peripheral fields, dysconjugate gaze. Mild right sided weakness with ataxia RUE and decrease in motor control RLE with ataxia as well.  . Strength remains grossly  4/5 in all 4 limbs.  Skin: Skin is warm and dry.  Musculoskeletal: decreased pain in greater trochs and surrounding ER's with palpation/pressure   Assessment/Plan: 1. 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 Physiatrist is providing close team supervision and 24 hour management of active medical problems listed below. Physiatrist and rehab team continue to assess barriers to discharge/monitor patient progress toward functional and medical goals. FIM: FIM - Bathing Bathing Steps Patient Completed: Chest;Right Arm;Left Arm;Abdomen;Front perineal area;Buttocks;Right upper leg;Left upper leg;Right lower leg (including foot);Left lower leg (including foot) Bathing: 5: Set-up assist to: Adjust water temp  FIM - Upper Body Dressing/Undressing Upper body dressing/undressing steps patient completed: Thread/unthread right sleeve of pullover shirt/dresss;Thread/unthread left sleeve of pullover shirt/dress;Put head through opening of pull over shirt/dress;Pull shirt over trunk Upper body dressing/undressing: 5: Set-up assist to: Obtain clothing/put away FIM - Lower Body Dressing/Undressing Lower body dressing/undressing steps patient completed: Thread/unthread right pants leg;Pull pants up/down;Fasten/unfasten pants;Don/Doff right sock;Don/Doff left sock;Thread/unthread left pants leg;Don/Doff right shoe;Don/Doff left shoe;Fasten/unfasten right  shoe;Fasten/unfasten left shoe Lower  body dressing/undressing: 4: Steadying Assist  FIM - Toileting Toileting steps completed by patient: Adjust clothing prior to toileting;Performs perineal hygiene;Adjust clothing after toileting Toileting Assistive Devices: Grab bar or rail for support Toileting: 4: Steadying assist  FIM - Diplomatic Services operational officer Devices:  (stood at toilet with steady A) Toilet Transfers: 3-To toilet/BSC: Mod A (lift or lower assist);3-From toilet/BSC: Mod A (lift or lower assist)  FIM - Bed/Chair Transfer Bed/Chair Transfer Assistive Devices: Bed rails;Arm rests Bed/Chair Transfer: 4: Bed > Chair or W/C: Min A (steadying Pt. > 75%);4: Chair or W/C > Bed: Min A (steadying Pt. > 75%)  FIM - Locomotion: Wheelchair Distance: 150' Locomotion: Wheelchair: 5: Travels 150 ft or more: maneuvers on rugs and over door sills with supervision, cueing or coaxing FIM - Locomotion: Ambulation Ambulation/Gait Assistance: 2: Max assist Locomotion: Ambulation: 4: Travels 150 ft or more with minimal assistance (Pt.>75%)  Comprehension Comprehension Mode: Auditory Comprehension: 5-Follows basic conversation/direction: With extra time/assistive device  Expression Expression Mode: Verbal Expression Assistive Devices: 6-Communication board Expression: 4-Expresses basic 75 - 89% of the time/requires cueing 10 - 24% of the time. Needs helper to occlude trach/needs to repeat words.  Social Interaction Social Interaction: 5-Interacts appropriately 90% of the time - Needs monitoring or encouragement for participation or interaction.  Problem Solving Problem Solving: 4-Solves basic 75 - 89% of the time/requires cueing 10 - 24% of the time  Memory Memory Mode: Not assessed Memory: 5-Recognizes or recalls 90% of the time/requires cueing < 10% of the time  Medical Problem List and Plan:  1. DVT Prophylaxis/Anticoagulation: Mechanical: Sequential compression devices, below knee Bilateral lower extremities   2. Headaches/pain: hips continue to show improvement. Tend to get a little sore at end of day  - trochanteric bursitis bilaterally  -lumbar xrays unremarkable  -ice/voltaren gel. Continue to work on ROM, avoid sleeping on sides 3. Mood: Seems to be in good spirits.  -in good spirits.  4. Neuropsych: This patient is capable of making decisions on his/her own behalf.  5. HTN: Will monitor with BID checks. Starting to trend upwards with increase in activity. May need additional agent. Continue Lotensin. Monitor renal status as on Nectar liquids.  6. DM type 2: Was on 70/30 insulin additionally at home-Hgb A1C @ 6.7. Monitor BS with AC/HS cbg checks. Continue metformin and glipizide--increased glipizide to bid- will titrate to 15mg  bid  -dietary ed 7. 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 Continue thiamine.  8. Hypokalemia:  Potassium replacement 9. Dysphagia: Needs supervision for meals. Encourage fluids  -diet upgraded 10. Confusion: check urine today. No respiratory symptoms  -no obvious pharmaceutical causes  LOS (Days) 11 A FACE TO FACE EVALUATION WAS PERFORMED  SWARTZ,ZACHARY T 11/08/2012 7:42 AM

## 2012-11-08 NOTE — Progress Notes (Signed)
Occupational Therapy Session Note  [name] MRN: [id number] Date of Birth: [birth year]  Today's Date: 11/08/2012 Time: 1330-1400 Time Calculation (min): 30 min  Short Term Goals: Week 2:  OT Short Term Goal 1 (Week 2): STG=LTG  Skilled Therapeutic Interventions/Progress Updates:    1:1 focus on functional mobility with RW, especially with turning in functional setting. Performed negotiating  around chairs in the dayroom with emphasis on safety , remaining inside RW and control with turning, and then side stepping through chairs. Pt continues to require hands on A in standing and min to mod A for dynamic balance with turning and negotiating obstacles. At end of session pt wanted to return to bed requiring steady A for turning around to sit on bed.   Therapy Documentation Precautions:  Precautions Precautions: Fall Restrictions Weight Bearing Restrictions: No Pain: Pain Assessment Pain Assessment: No/denies pain  See FIM for current functional status  Therapy/Group: Individual Therapy  Roney Mans [facility] 11/08/2012, 2:07 PM

## 2012-11-09 ENCOUNTER — Encounter (HOSPITAL_COMMUNITY): Payer: BC Managed Care – PPO | Admitting: Occupational Therapy

## 2012-11-09 ENCOUNTER — Inpatient Hospital Stay (HOSPITAL_COMMUNITY): Payer: Medicare Other | Admitting: Occupational Therapy

## 2012-11-09 ENCOUNTER — Inpatient Hospital Stay (HOSPITAL_COMMUNITY): Payer: Medicare Other | Admitting: Speech Pathology

## 2012-11-09 ENCOUNTER — Inpatient Hospital Stay (HOSPITAL_COMMUNITY): Payer: Medicare Other | Admitting: Physical Therapy

## 2012-11-09 LAB — CBC
MCH: 31.5 pg (ref 26.0–34.0)
MCHC: 35.6 g/dL (ref 30.0–36.0)
MCV: 88.5 fL (ref 78.0–100.0)
Platelets: 475 10*3/uL — ABNORMAL HIGH (ref 150–400)
RBC: 4.86 MIL/uL (ref 4.22–5.81)

## 2012-11-09 LAB — URINE CULTURE
Colony Count: NO GROWTH
Culture: NO GROWTH

## 2012-11-09 LAB — GLUCOSE, CAPILLARY
Glucose-Capillary: 85 mg/dL (ref 70–99)
Glucose-Capillary: 188 mg/dL — ABNORMAL HIGH (ref 70–99)

## 2012-11-09 MED ORDER — PANTOPRAZOLE SODIUM 40 MG PO TBEC
40.0000 mg | DELAYED_RELEASE_TABLET | Freq: Every day | ORAL | Status: DC
  Administered 2012-11-10 – 2012-11-15 (×6): 40 mg via ORAL
  Filled 2012-11-09 (×6): qty 1

## 2012-11-09 MED ORDER — WHITE PETROLATUM GEL
Status: AC
  Administered 2012-11-09: 13:00:00
  Filled 2012-11-09: qty 5

## 2012-11-09 MED ORDER — GLUCERNA SHAKE PO LIQD
237.0000 mL | Freq: Every day | ORAL | Status: DC
  Administered 2012-11-09 – 2012-11-15 (×7): 237 mL via ORAL

## 2012-11-09 NOTE — Progress Notes (Signed)
The 2-3 remaining sutures has been removed completely. Patient tolerated well.

## 2012-11-09 NOTE — Progress Notes (Signed)
Social Work Patient ID: Margit Hanks, male   DOB: February 09, 1935, 78 y.o.   MRN: [id number]  Have reviewed team conference with patient and friend, [name].  Both aware we continue to plan for 4/11 d/c and they confirm that pt will d/c to friend's home where 24/7 assistance will be in place.  [name] to be here on Thursday 4/10 at 2:00 for family education.  Need to confirm at that time that transportation will be available to get pt to OP therapies as recommended.  Continue to follow.  Marri Mcneff, LCSW

## 2012-11-09 NOTE — Progress Notes (Signed)
Physical Therapy Note  Patient Details  Name: SEDALE JENIFER MRN: [id number] Date of Birth: 1934-11-05 Today's Date: 11/09/2012  Time: 845-945 60 minutes  No c/o pain, pt reports his back pain is "much better".   Gait training in controlled environment with RW close supervision- min A.  obstacle negotiation and stepping over objects with RW with min cuing for safety, min A for balance.  Pt tends to lose balance R and posteriorly, especially with turns and when fatigued.  Standing balance training without UE support with tapping all directions, alternating patterns with min cuing to recall pattern, mod-max A for dynamic standing balance.  Standing balance with UE reaching activity with min A for static balance with UE task.  Pt c/o increased dizziness today with looking down, improved when gaze returned to neutral.  Individual therapy   Deniyah Dillavou 11/09/2012, 9:46 AM

## 2012-11-09 NOTE — Progress Notes (Signed)
Speech Language Pathology Daily Session Notes  [name] MRN: [id number] Date of Birth: [birth year]  Today's Date: 11/09/2012  Session 1 Time: 4098-1191 Time Calculation (min): 40 min  Session 2 Time: 1145-1200 Time Calculation: 15 min  Short Term Goals: Week 2: SLP Short Term Goal 1 (Week 2): Pt will consume trials of Dys. 3 textures and demonstrate efficient mastication with supervision verbal cues wituout overt s/s of aspiration.  SLP Short Term Goal 2 (Week 2): Pt will consume trials of thin liquids without overt s/s of aspiration with Mod I SLP Short Term Goal 3 (Week 2): Pt will utilize swallowing compensatory strategies with supervision verbal cues to minimize overt s/s of aspiration SLP Short Term Goal 4 (Week 2): Pt will utilize speech intelligibility strategies at the phrase level with supervision verbal cues for 90% intelligibility SLP Short Term Goal 5 (Week 2): Pt will demonstrate functional problem solving for mildly complex tasks with Min A verbal cues  SLP Short Term Goal 6 (Week 2): Pt will utilize external memory aids to recall new, daily information with Mod I.   Skilled Therapeutic Interventions:  Session 1: Treatment focus on speech and dysphagia goals. Pt consumed thin liquids via cup with an intermittent cough X 2, suspect due to large sips. Pt performed diaphragmatic breathing exercises with supervision verbal and visual cues and required Max A verbal cues to utilize at the word and phrase level to increase vocal intensity and overall speech intelligibility.    Session 2: Co-treatment with OT in [place] with treatment focus on dysphagia goals. Pt demonstrated increased PO intake and consumed Dys. 2 textures and thin liquids via cup with minimal overt s/s of aspiration and utilized swallowing compensatory strategies with Min A verbal cues.   FIM:  Comprehension Comprehension Mode: Auditory Comprehension: 5-Understands complex 90% of  the time/Cues < 10% of the time Expression Expression Mode: Verbal Expression: 5-Expresses basic 90% of the time/requires cueing < 10% of the time. Social Interaction Social Interaction: 5-Interacts appropriately 90% of the time - Needs monitoring or encouragement for participation or interaction. Problem Solving Problem Solving: 5-Solves basic 90% of the time/requires cueing < 10% of the time Memory Memory: 5-Recognizes or recalls 90% of the time/requires cueing < 10% of the time FIM - Eating Eating Activity: 5: Supervision/cues  Pain Pain Assessment Pain Assessment: No/denies pain  Therapy/Group: Individual Therapy and Dysphagia Group  Maren Wiesen 11/09/2012, 4:13 PM

## 2012-11-09 NOTE — Progress Notes (Signed)
Occupational Therapy Session Note  [name] MRN: [id number] Date of Birth: [birth year]  Today's Date: 11/09/2012 Time: 1130-1145 Time Calculation (min): 15 min  Skilled Therapeutic Interventions/Progress Updates:    Pt was seen in [employer] today with focus on self feeding and swallowing strategies. Pt required occasional vc's for chin tuck and to swallow twice as well as to not put too much food in his mouth at one time. Pt demonstrated good bilateral UE/hand use for opening up packages, containers & setting up his tray today.  Therapy Documentation Precautions:  Precautions Precautions: Fall Restrictions Weight Bearing Restrictions: No     Pain:  No c/o pain     See FIM for current functional status  Therapy/Group: Group Therapy  Charletta Cousin, Irie Dowson Beth Dixon 11/09/2012, 1:51 PM

## 2012-11-09 NOTE — Patient Care Conference (Signed)
Inpatient RehabilitationTeam Conference and Plan of Care Update Date: 11/08/2012   Time: 2:50 PM    Patient Name: Bill Jackson      Medical Record Number: [id number]  Date of Birth: 01/30/1935 Sex: Male         Room/Bed: 4010/4010-01 Payor Info: Payor: [employer] [place] [employer]ICARE  Plan: BLUE MEDICARE  Product Type: *No Product type*     Admitting Diagnosis: ICH  Admit Date/Time:  10/28/2012  3:33 PM Admission Comments: No comment available   Primary Diagnosis:  Acute cerebellar hemorrhage Principal Problem: Acute cerebellar hemorrhage  Patient Active Problem List   Diagnosis Date Noted  . Glaucoma 11/03/2012  . Headache 10/29/2012  . Cough 10/29/2012  . OA (osteoarthritis) of knee-right 10/28/2012  . Accelerated hypertension 10/28/2012  . Hypokalemia 10/28/2012  . Acute cerebellar hemorrhage 10/28/2012  . Encounter for long-term (current) use of medications 10/28/2012  . Type 2 diabetes mellitus 10/25/2012  . Hypertension 10/25/2012    Expected Discharge Date: Expected Discharge Date: 11/11/12  Team Members Present: Physician leading conference: Dr. Faith Rogue Social Worker Present: Amada Jupiter, LCSW Nurse Present: Gregor Hams, RN PT Present: Reggy Eye, PT OT Present: Ardis Rowan, Corky Crafts, OT;Jennifer Katrinka Blazing, OT SLP Present: Feliberto Gottron, SLP Other (Discipline and Name): Tora Duck, PPS Coordinator and Bayard Hugger, RN     Current Status/Progress Goal Weekly Team Focus  Medical   pain is better. complaining of more dizziness  see above  see prior. positive reinforcement   Bowel/Bladder   Continent of bowel and bladder/ LBM 11/07/2012. Uses urinals On scheduled senekot BID  Remain continent of bowel and bladder.  Monitor for continence, constipation or dairrhea,.   Swallow/Nutrition/ Hydration   Dys. 2 textures with thin liquids, Min A for utilization of swallowing compensatory strategies  supervision with least restrictive diet   trials of Dys. 3 textures, increases utilization of swallowing compensatory strategies   ADL's   steady A in standing, supervision in sitting , min A with functional ambulation  supervision overall  family education, standing balance,    Mobility   min A - supervision  supervision  balance, coordination   Communication   Min-Mod A  Min A  increased utilization of speech intelligibility strategies   Safety/Cognition/ Behavioral Observations  Supervision-Min A  Min A  awareness, problem solving   Pain   Denies any pain. Oxycodone 5-10 mf PRN and Tramadol 50 mg PRN  Pain level 3 or less on a scale of 0-10  Assess and monitor any onset of pain.   Skin   Staples out to L scalp 11/07/2012 OTA, all sutures removes but 3 left to posterior head/neck .  No new skin breakdown/infection  Assess and monitor skin q shift.    Rehab Goals Patient on target to meet rehab goals: Yes *See Care Plan and progress notes for long and short-term goals.  Barriers to Discharge: premorbid behavior    Possible Resolutions to Barriers:  see prior    Discharge Planning/Teaching Needs:  Pt to d/c home with friend who is arranging 24/7 assistance      Team Discussion:  Reaching supervision goals. Poor appetite (D2, nectar) - ST feels she could upgrade diet if pt would follow precautions.  Pain seems better controlled.  C/o dizziness - plan vestibular eval.  Revisions to Treatment Plan:  To conduct a formal vestibular eval prior to d/c.   Continued Need for Acute Rehabilitation Level of Care: The patient requires daily medical management  by a physician with specialized training in physical medicine and rehabilitation for the following conditions: Daily direction of a multidisciplinary physical rehabilitation program to ensure safe treatment while eliciting the highest outcome that is of practical value to the patient.: Yes Daily medical management of patient stability for increased activity during participation  in an intensive rehabilitation regime.: Yes Daily analysis of laboratory values and/or radiology reports with any subsequent need for medication adjustment of medical intervention for : Neurological problems;Other (ortho, pain issues)  Marquin Patino 11/09/2012, 1:13 PM

## 2012-11-09 NOTE — Progress Notes (Signed)
Occupational Therapy Session Note  [name] MRN: [id number] Date of Birth: 12/09/1934  Today's Date: 11/09/2012 Time: 0730-0830 Time Calculation (min): 60 min  Short Term Goals: Week 2:  OT Short Term Goal 1 (Week 2): STG=LTG  Skilled Therapeutic Interventions/Progress Updates:    1:1 self care retraining at shower level down in ADL apartment with focus on functional ambulation with turns with RW, toileting sit to stands, standing balance without UE support for clothing management, use of RW without 4 lbs of weight on RW to transition to decreased resistance on walker with ambulation. Pt still requiring close supervision to min A with ambulation. Discussed d/c planning with pt and discharging home with friend [name] and having him come in for family education tomorrow.   Therapy Documentation Precautions:  Precautions Precautions: Fall Restrictions Weight Bearing Restrictions: No Pain: Pain Assessment Pain Assessment: No/denies pain  See FIM for current functional status  Therapy/Group: Individual Therapy  Roney Mans [facility] 11/09/2012, 8:26 AM

## 2012-11-09 NOTE — Progress Notes (Signed)
INITIAL NUTRITION ASSESSMENT  DOCUMENTATION CODES Per approved criteria  -Obesity Unspecified   INTERVENTION: 1. Glucerna Shake po daily, each supplement provides 220 kcal and 10 grams of protein. 2. Discussed ways to increase oral intake - provided handout with Dysphagia 2 suggestions 3. RD to continue to follow nutrition care plan  NUTRITION DIAGNOSIS: Inadequate oral intake related to poor appetite as evidenced by poor meal completion.   Goal: Intake to meet >90% of estimated nutrition needs.  Monitor:  weight trends, lab trends, I/O's, PO intake, supplement tolerance  Reason for Assessment: MD Consult for Poor PO Intake/Food Choices  78 y.o. male  Admitting Dx: Acute cerebellar hemorrhage  ASSESSMENT: 78 y.o. male with history of HTN and DM. Admitted on 3/25 with acute onset of nausea and vomiting, dizziness, and ataxia. CT scan revealed acute cerebellar hemorrhage with mild mass effect on fourth ventricle. Underwent emergent suboccipital craniectomy for evacuation of hematoma that evening. Patient with resultant dysarthria, weak cough, dysphagia, significant dysmetria RUE and significant visual deficits. Admitted to inpatient rehab on 3/28.  BSE completed 3/31 with recommendations for downgrading diet to Dysphagia 2 with Honey-Thickened Liquids (was previously ordered for Dysphagia 3 with Nectars.)  Underwent MBSS on 4/1 with recommendations for Dysphagia 2 diet with Nectar-Liquids.  Working towards d/c date of 4/11. Pain improved since admission.  Per most recent SLP note, pt attributes his decreased oral intake to poor appetite and "bitter" taste in his mouth 2/2 crushed mediations. SLP decided pt can receive medications whole in puree textures. Per chart, pt advanced to thin liquids yesterday, remains on Dysphagia 2 at this time.  Discussed poor intake with patient, RN, SLP, and PA. Pt reports that all of the foods that he is receiving are dry. Discussed gravies and sauces,  however pt reports that does not help him. He states that he has not had a BM recently, however per chart, pt's last BM was 4/7. Provided pt with Glucerna Shake to try, he enjoyed. Discussed drinking this daily to help with oral intake, pt verbalized understanding.  Provided pt with "Dysphagia 2 Nutrition Therapy" handout to provide additional suggestions to increase calories and protein intake.  Height: Ht Readings from Last 1 Encounters:  10/28/12 5\' 7"  (1.702 m)    Weight: Wt Readings from Last 1 Encounters:  11/02/12 205 lb 4 oz (93.1 kg)    Ideal Body Weight: 148 lb  % Ideal Body Weight: 139%  Wt Readings from Last 10 Encounters:  11/02/12 205 lb 4 oz (93.1 kg)  10/25/12 203 lb 0.7 oz (92.1 kg)  10/25/12 203 lb 0.7 oz (92.1 kg)    Usual Body Weight: 203 lb  % Usual Body Weight: 101%  BMI:  Body mass index is 32.14 kg/(m^2). Obese Class I  Estimated Nutritional Needs: Kcal: 1950 - 2200 Protein: 90 - 110 grams Fluid: at least 2 liters daily  Skin: neck and head incision  Diet Order: Dysphagia 2 with thin liquids  EDUCATION NEEDS: -Education needs addressed   Intake/Output Summary (Last 24 hours) at 11/09/12 0805 Last data filed at 11/09/12 0647  Gross per 24 hour  Intake    340 ml  Output   1000 ml  Net   -660 ml    Last BM: 4/7  Labs:   Recent Labs Lab 11/07/12 0555  NA 136  K 4.0  CL 97  CO2 32  BUN 14  CREATININE 1.15  CALCIUM 8.8  GLUCOSE 167*    CBG (last 3)   Recent  Labs  11/08/12 1106 11/08/12 1648 11/08/12 2111  GLUCAP 223* 135* 176*    Scheduled Meds: . antiseptic oral rinse  15 mL Mouth Rinse q12n4p  . brimonidine  1 drop Both Eyes Q8H  . chlorhexidine  15 mL Mouth Rinse BID  . diclofenac sodium  4 g Topical TID  . dorzolamide-timolol  1 drop Right Eye BID  . folic acid  1 mg Oral Daily  . glipiZIDE  15 mg Oral BID AC  . hydrochlorothiazide  25 mg Oral Daily  . insulin aspart  0-5 Units Subcutaneous QHS  . insulin  aspart  0-9 Units Subcutaneous TID WC  . latanoprost  1 drop Both Eyes QHS  . lidocaine  2 patch Transdermal Q24H  . losartan  100 mg Oral Daily  . metFORMIN  1,000 mg Oral BID WC  . multivitamin with minerals  1 tablet Oral Daily  . pantoprazole sodium  40 mg Oral Daily  . potassium chloride  20 mEq Oral Daily  . senna-docusate  1 tablet Oral BID  . thiamine  100 mg Oral Daily    Continuous Infusions:   Past Medical History  Diagnosis Date  . Hypertension   . Diabetes mellitus     Past Surgical History  Procedure Laterality Date  . Heart stents  x3  . Cardiac surgery    . Hernia repair    . Rotator cuff repair    . Craniectomy N/A 10/25/2012    Procedure: Suboccipital Craniectomy for Evacuation of Cerebellar Hematoma;  Surgeon: Maeola Harman, MD;  Location: [facility];  Service: Neurosurgery;  Laterality: N/A;  Suboccipital Craniectomy for Evacuation of Cerebellar Hematoma    Jarold Motto MS, RD, LDN Pager: [phone] After-hours pager: [phone]

## 2012-11-09 NOTE — Progress Notes (Signed)
Patient ID: Bill Jackson, male   DOB: 1935-01-14, 78 y.o.   MRN: [id number]  Subjective/Complaints: Rested comfortably. No issues overnight. Pain improved. A 12 point review of systems has been performed and if not noted above is otherwise negative.   Objective: Vital Signs: Blood pressure 139/83, pulse 66, temperature 98.3 F (36.8 C), temperature source Oral, resp. rate 18, height 5\' 7"  (1.702 m), weight 93.1 kg (205 lb 4 oz), SpO2 98.00%. No results found.  Recent Labs  11/09/12 0500  WBC 5.1  HGB 15.3  HCT 43.0  PLT 475*    Recent Labs  11/07/12 0555  NA 136  K 4.0  CL 97  GLUCOSE 167*  BUN 14  CREATININE 1.15  CALCIUM 8.8   CBG (last 3)   Recent Labs  11/08/12 1106 11/08/12 1648 11/08/12 2111  GLUCAP 223* 135* 176*    Wt Readings from Last 3 Encounters:  11/02/12 93.1 kg (205 lb 4 oz)  10/25/12 92.1 kg (203 lb 0.7 oz)  10/25/12 92.1 kg (203 lb 0.7 oz)    Physical Exam:  Constitutional: He is oriented to person, place, and time. He appears well-developed and well-nourished.   Marland Kitchen HENT:  Head: Normocephalic and atraumatic.  Right Ear: External ear normal.  Left Ear: External ear normal.  Eyes: Conjunctivae and EOM are normal. Pupils are equal, round, and reactive to light. Right eye exhibits no discharge. Left eye exhibits no discharge. No scleral icterus.  Dysconjugate gaze. Arcus senilis. Pinpoint pupils.  Neck: Neck supple. No JVD present. No thyromegaly present.  Limited due to posterior neck incision.  Cardiovascular: Normal rate and regular rhythm. Exam reveals no gallop and no friction rub.  No murmur heard.  Pulmonary/Chest: Effort normal and breath sounds normal. No respiratory distress. He has no wheezes. He has no rales. He exhibits no tenderness.  Abdominal: Soft. Bowel sounds are normal. He exhibits no distension. There is no tenderness. There is no rebound.  Lymphadenopathy:  He has no cervical adenopathy.  Neurological: He is alert  slightly confused.  Moderate to severe dysarthria persists.     Occasional perseverative.   Visual deficits in L>R peripheral fields, dysconjugate gaze. Mild right sided weakness with ataxia RUE and decrease in motor control RLE with ataxia as well.  . Strength remains grossly  4/5 in all 4 limbs.  Skin: Skin is warm and dry.  Musculoskeletal: decreased pain in greater trochs and surrounding ER's with palpation/pressure   Assessment/Plan: 1. 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 Physiatrist is providing close team supervision and 24 hour management of active medical problems listed below. Physiatrist and rehab team continue to assess barriers to discharge/monitor patient progress toward functional and medical goals. FIM: FIM - Bathing Bathing Steps Patient Completed: Chest;Right Arm;Left Arm;Abdomen;Front perineal area;Buttocks;Right upper leg;Left upper leg;Right lower leg (including foot);Left lower leg (including foot) Bathing: 5: Set-up assist to: Adjust water temp  FIM - Upper Body Dressing/Undressing Upper body dressing/undressing steps patient completed: Thread/unthread right sleeve of pullover shirt/dresss;Thread/unthread left sleeve of pullover shirt/dress;Put head through opening of pull over shirt/dress;Pull shirt over trunk Upper body dressing/undressing: 5: Set-up assist to: Obtain clothing/put away FIM - Lower Body Dressing/Undressing Lower body dressing/undressing steps patient completed: Thread/unthread right pants leg;Pull pants up/down;Fasten/unfasten pants;Don/Doff right sock;Don/Doff left sock;Thread/unthread left pants leg;Don/Doff right shoe;Don/Doff left shoe;Fasten/unfasten right shoe;Fasten/unfasten left shoe;Thread/unthread right underwear leg;Thread/unthread left underwear leg;Pull  underwear  up/down Lower body dressing/undressing: 4: Steadying Assist  FIM - Toileting Toileting steps completed by patient: Adjust clothing prior to toileting;Performs perineal hygiene;Adjust clothing after toileting Toileting Assistive Devices: Grab bar or rail for support Toileting: 4: Steadying assist  FIM - Diplomatic Services operational officer Devices:  (stood at toilet with steady A) Toilet Transfers: 3-To toilet/BSC: Mod A (lift or lower assist);3-From toilet/BSC: Mod A (lift or lower assist)  FIM - Bed/Chair Transfer Bed/Chair Transfer Assistive Devices: Bed rails;Arm rests Bed/Chair Transfer: 4: Bed > Chair or W/C: Min A (steadying Pt. > 75%);4: Chair or W/C > Bed: Min A (steadying Pt. > 75%)  FIM - Locomotion: Wheelchair Distance: 150' Locomotion: Wheelchair: 5: Travels 150 ft or more: maneuvers on rugs and over door sills with supervision, cueing or coaxing FIM - Locomotion: Ambulation Ambulation/Gait Assistance: 2: Max assist Locomotion: Ambulation: 4: Travels 150 ft or more with minimal assistance (Pt.>75%)  Comprehension Comprehension Mode: Auditory Comprehension: 5-Follows basic conversation/direction: With no assist  Expression Expression Mode: Verbal Expression Assistive Devices: 6-Communication board Expression: 5-Expresses basic 90% of the time/requires cueing < 10% of the time.  Social Interaction Social Interaction: 5-Interacts appropriately 90% of the time - Needs monitoring or encouragement for participation or interaction.  Problem Solving Problem Solving: 4-Solves basic 75 - 89% of the time/requires cueing 10 - 24% of the time  Memory Memory Mode: Not assessed Memory: 5-Recognizes or recalls 90% of the time/requires cueing < 10% of the time  Medical Problem List and Plan:  1. DVT Prophylaxis/Anticoagulation: Mechanical: Sequential compression devices, below knee Bilateral lower extremities  2. Headaches/pain: hips continue to show improvement. Tend to  get a little sore at end of day  - trochanteric bursitis bilaterally  -lumbar xrays unremarkable  -ice/voltaren gel. Continue to work on ROM, avoid sleeping on sides 3. Mood: Seems to be in good spirits.  -in good spirits.  4. Neuropsych: This patient is capable of making decisions on his/her own behalf.  5. HTN: Will monitor with BID checks. Starting to trend upwards with increase in activity. May need additional agent. Continue Lotensin. Monitor renal status as on Nectar liquids.  6. DM type 2: Was on 70/30 insulin additionally at home-Hgb A1C @ 6.7. Monitor BS with AC/HS cbg checks. Continue metformin and glipizide--increased glipizide to bid- increased to 15mg  bid  -dietary ed 7. H/o Alcohol use:  Continue thiamine.  8. Hypokalemia:  Potassium replacement 9. Dysphagia: Needs supervision for meals. Encourage fluids  -diet upgraded 10. Confusion: ?resolved, behavioral  -ua negative, culture pending  -labs ok today thus far     LOS (Days) 12 A FACE TO FACE EVALUATION WAS PERFORMED  Dakia Schifano T 11/09/2012 7:57 AM

## 2012-11-10 ENCOUNTER — Encounter (HOSPITAL_COMMUNITY): Payer: BC Managed Care – PPO | Admitting: Occupational Therapy

## 2012-11-10 ENCOUNTER — Inpatient Hospital Stay (HOSPITAL_COMMUNITY): Payer: Medicare Other | Admitting: Occupational Therapy

## 2012-11-10 ENCOUNTER — Inpatient Hospital Stay (HOSPITAL_COMMUNITY): Payer: BC Managed Care – PPO | Admitting: Occupational Therapy

## 2012-11-10 ENCOUNTER — Inpatient Hospital Stay (HOSPITAL_COMMUNITY): Payer: Medicare Other | Admitting: Physical Therapy

## 2012-11-10 ENCOUNTER — Inpatient Hospital Stay (HOSPITAL_COMMUNITY): Payer: Medicare Other | Admitting: Speech Pathology

## 2012-11-10 DIAGNOSIS — I619 Nontraumatic intracerebral hemorrhage, unspecified: Secondary | ICD-10-CM

## 2012-11-10 DIAGNOSIS — I10 Essential (primary) hypertension: Secondary | ICD-10-CM

## 2012-11-10 DIAGNOSIS — M76899 Other specified enthesopathies of unspecified lower limb, excluding foot: Secondary | ICD-10-CM

## 2012-11-10 LAB — GLUCOSE, CAPILLARY
Glucose-Capillary: 228 mg/dL — ABNORMAL HIGH (ref 70–99)
Glucose-Capillary: 103 mg/dL — ABNORMAL HIGH (ref 70–99)
Glucose-Capillary: 109 mg/dL — ABNORMAL HIGH (ref 70–99)

## 2012-11-10 LAB — BASIC METABOLIC PANEL WITH GFR
BUN: 16 mg/dL (ref 6–23)
CO2: 29 meq/L (ref 19–32)
Calcium: 9.5 mg/dL (ref 8.4–10.5)
Chloride: 94 meq/L — ABNORMAL LOW (ref 96–112)
Creatinine, Ser: 1.05 mg/dL (ref 0.50–1.35)
GFR calc Af Amer: 78 mL/min — ABNORMAL LOW
GFR calc non Af Amer: 67 mL/min — ABNORMAL LOW
Glucose, Bld: 219 mg/dL — ABNORMAL HIGH (ref 70–99)
Potassium: 4.1 meq/L (ref 3.5–5.1)
Sodium: 132 meq/L — ABNORMAL LOW (ref 135–145)

## 2012-11-10 MED ORDER — INSULIN ASPART PROT & ASPART (70-30 MIX) 100 UNIT/ML ~~LOC~~ SUSP
5.0000 [IU] | Freq: Two times a day (BID) | SUBCUTANEOUS | Status: DC
  Administered 2012-11-10 – 2012-11-14 (×9): 5 [IU] via SUBCUTANEOUS
  Filled 2012-11-10: qty 10

## 2012-11-10 MED ORDER — GLIPIZIDE 10 MG PO TABS
10.0000 mg | ORAL_TABLET | Freq: Two times a day (BID) | ORAL | Status: DC
  Administered 2012-11-10 – 2012-11-15 (×10): 10 mg via ORAL
  Filled 2012-11-10 (×12): qty 1

## 2012-11-10 NOTE — Progress Notes (Signed)
Social Work Patient ID: Bill Jackson, male   DOB: [birth year], 78 y.o.   MRN: [id number]  Met with patient this morning and again this afternoon with him and two friends [name] and [place]) to discuss d/c planning concerns.  Pt has expressed concerns several times over past few days about whether he will have adequate assist at [name] home and asks what options he has.  Did review with him about option of SNF and pt ultimately decides that this is the plan he wishes to pursue.  Discussed this with all three this afternoon and all are agreed with change of plan to SNF.  Have cancelled planned d/c for tomorrow and will begin bed search process.  Khamil Lamica, LCSW

## 2012-11-10 NOTE — Progress Notes (Signed)
Physical Therapy Note  [name] MRN: [id number] Date of Birth: 13-Jun-1935 Today's Date: 11/10/2012  Time 1: 900-930 30 minutes  1:1 No c/o pain.  Treatment session focused on gait training with obstacle negotiation. Pt able to gait with supervision in controlled environment but continues to require min-mod A with all turns and obstacle negotiation.  Stair training 2 x 5 stairs with B handrails with supervision, cuing for safety.  Pt continues to be limited by c/o dizziness with turning and looking down.  Time 2: 1430-1500 30 minutes  1:1 No c/o pain.  Treatment focused on showing caregivers how pt is performing as pt is now planning to d/c to SNF.  Demo'd car transfer, gait and stairs with 1 railing with min A.  Curb negotiation with RW with min A.  Caregivers understand pt's need for assistance and 24/7 supervision.   Jessilynn Taft 11/10/2012, 11:29 AM

## 2012-11-10 NOTE — Progress Notes (Addendum)
Patient ID: Bill Jackson, male   DOB: May 31, 1935, 78 y.o.   MRN: [id number]  Subjective/Complaints: Concerned that "things have changed at home" regarding his help. SW note says otherwise. Pain better. Still with balance issues which concern him A 12 point review of systems has been performed and if not noted above is otherwise negative.   Objective: Vital Signs: Blood pressure 149/81, pulse 63, temperature 98.2 F (36.8 C), temperature source Oral, resp. rate 18, height 5\' 7"  (1.702 m), weight 93.1 kg (205 lb 4 oz), SpO2 96.00%. No results found.  Recent Labs  11/09/12 0500  WBC 5.1  HGB 15.3  HCT 43.0  PLT 475*    Recent Labs  11/10/12 0618  NA 132*  K 4.1  CL 94*  GLUCOSE 219*  BUN 16  CREATININE 1.05  CALCIUM 9.5   CBG (last 3)   Recent Labs  11/09/12 1133 11/09/12 1631 11/09/12 2043  GLUCAP 260* 85 188*    Wt Readings from Last 3 Encounters:  11/02/12 93.1 kg (205 lb 4 oz)  10/25/12 92.1 kg (203 lb 0.7 oz)  10/25/12 92.1 kg (203 lb 0.7 oz)    Physical Exam:  Constitutional: He is oriented to person, place, and time. He appears well-developed and well-nourished.   Marland Kitchen HENT:  Head: Normocephalic and atraumatic.  Right Ear: External ear normal.  Left Ear: External ear normal.  Eyes: Conjunctivae and EOM are normal. Pupils are equal, round, and reactive to light. Right eye exhibits no discharge. Left eye exhibits no discharge. No scleral icterus.  Dysconjugate gaze. Arcus senilis. Pinpoint pupils.  Neck: Neck supple. No JVD present. No thyromegaly present.  Limited due to posterior neck incision.  Cardiovascular: Normal rate and regular rhythm. Exam reveals no gallop and no friction rub.  No murmur heard.  Pulmonary/Chest: Effort normal and breath sounds normal. No respiratory distress. He has no wheezes. He has no rales. He exhibits no tenderness.  Abdominal: Soft. Bowel sounds are normal. He exhibits no distension. There is no tenderness. There is no  rebound.  Lymphadenopathy:  He has no cervical adenopathy.  Neurological: He is alert slightly confused.  Moderate to severe dysarthria persists.     Occasional perseverative.   Visual deficits in L>R peripheral fields, dysconjugate gaze. Mild right sided weakness with ataxia RUE and decrease in motor control RLE with ataxia as well.  . Strength remains grossly  4/5 in all 4 limbs.  Skin: Skin is warm and dry.  Musculoskeletal: decreased pain in greater trochs and surrounding ER's with palpation/pressure   Assessment/Plan: 1. 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 Physiatrist is providing close team supervision and 24 hour management of active medical problems listed below. Physiatrist and rehab team continue to assess barriers to discharge/monitor patient progress toward functional and medical goals. FIM: FIM - Bathing Bathing Steps Patient Completed: Chest;Right Arm;Left Arm;Abdomen;Front perineal area;Buttocks;Right upper leg;Left upper leg;Right lower leg (including foot);Left lower leg (including foot) Bathing: 5: Set-up assist to: Adjust water temp  FIM - Upper Body Dressing/Undressing Upper body dressing/undressing steps patient completed: Thread/unthread right sleeve of pullover shirt/dresss;Thread/unthread left sleeve of pullover shirt/dress;Put head through opening of pull over shirt/dress;Pull shirt over trunk Upper body dressing/undressing: 5: Set-up assist to: Obtain clothing/put away FIM - Lower Body Dressing/Undressing Lower body dressing/undressing steps patient completed: Thread/unthread right pants leg;Pull pants up/down;Fasten/unfasten pants;Don/Doff right sock;Don/Doff left sock;Thread/unthread left  pants leg;Don/Doff right shoe;Don/Doff left shoe;Fasten/unfasten right shoe;Fasten/unfasten left  shoe;Thread/unthread right underwear leg;Thread/unthread left underwear leg;Pull underwear up/down Lower body dressing/undressing: 5: Set-up assist to: Don/Doff TED stocking  FIM - Toileting Toileting steps completed by patient: Adjust clothing prior to toileting;Performs perineal hygiene;Adjust clothing after toileting Toileting Assistive Devices: Grab bar or rail for support Toileting: 4: Steadying assist  FIM - Diplomatic Services operational officer Devices: Art gallery manager Transfers: 4-To toilet/BSC: Min A (steadying Pt. > 75%);4-From toilet/BSC: Min A (steadying Pt. > 75%)  FIM - Bed/Chair Transfer Bed/Chair Transfer Assistive Devices: Bed rails;Arm rests Bed/Chair Transfer: 4: Bed > Chair or W/C: Min A (steadying Pt. > 75%);4: Chair or W/C > Bed: Min A (steadying Pt. > 75%)  FIM - Locomotion: Wheelchair Distance: 150' Locomotion: Wheelchair: 5: Travels 150 ft or more: maneuvers on rugs and over door sills with supervision, cueing or coaxing FIM - Locomotion: Ambulation Ambulation/Gait Assistance: 2: Max assist Locomotion: Ambulation: 4: Travels 150 ft or more with minimal assistance (Pt.>75%)  Comprehension Comprehension Mode: Auditory Comprehension: 5-Understands complex 90% of the time/Cues < 10% of the time  Expression Expression Mode: Verbal Expression Assistive Devices: 6-Communication board Expression: 5-Expresses basic 90% of the time/requires cueing < 10% of the time.  Social Interaction Social Interaction: 5-Interacts appropriately 90% of the time - Needs monitoring or encouragement for participation or interaction.  Problem Solving Problem Solving: 5-Solves basic 90% of the time/requires cueing < 10% of the time  Memory Memory Mode: Not assessed Memory: 5-Recognizes or recalls 90% of the time/requires cueing < 10% of the time  Medical Problem List and Plan:  1. DVT Prophylaxis/Anticoagulation: Mechanical: Sequential compression devices, below knee  Bilateral lower extremities  2. Headaches/pain:  Overall much improved  - trochanteric bursitis bilaterally  -lumbar xrays unremarkable  -ice/voltaren gel. Continue to work on ROM, avoid sleeping on sides 3. Mood: Seems to be in good spirits.  -in good spirits.  4. Neuropsych: This patient is capable of making decisions on his/her own behalf.  5. HTN: Continue hctz,cozaar. Monitoring renal status   6. DM type 2: Was on 70/30 insulin additionally at home-Hgb A1C @ 6.7. Monitor BS with AC/HS cbg checks. Continue metformin and glipizide--change back to 10mg  bid and reintroduce insulin 5u bid  -dietary ed 7. H/o Alcohol use:  dc'ed thiamine 8. FEN:  Potassium replacement--level normal.   -slightly hyponatremic now---recheck in am.  Consider stopping hctz  9. Dysphagia: Needs supervision for meals. Encourage fluids  -diet upgraded 10. Confusion: resolved--at baseline  -ua negative, culture negative      LOS (Days) 13 A FACE TO FACE EVALUATION WAS PERFORMED  Advait Buice T 11/10/2012 7:36 AM

## 2012-11-10 NOTE — Progress Notes (Addendum)
Physical Therapy Note  [name] MRN: [id number] Date of Birth: 1935/04/23 Today's Date: 11/10/2012 Time: 11:40 - 11:50am Time Calculation (min):  Pt was seen in [employer] today with focus on self feeding and swallowing strategies. Pt required min vc's to swallow twice as well as to not put too much food in his mouth at one time. Pt demonstrated good bilateral UE/hand use for opening up packages, containers & setting up his tray and participated in conversation/social interaction with others during lunch.    Pt was w/o c/o pain & sitting up in w/c at RN station at conclusion of [employer].  Group therapy session.  Roselie Awkward Dixon 11/10/2012, 12:21 PM

## 2012-11-10 NOTE — Progress Notes (Signed)
Occupational Therapy Discharge Summary  [name] MRN: [id number] Date of Birth: Mar 29, 1935  Today's Date: 11/10/2012 Time: 0730-0830 Time Calculation (min): 60 min  1:1self care retraining down in ADL apartment with focus on functional ambulation, safety with turns with RW, d/c planning, home safety tips for home, setup of DME in house ([name] house).   2nd session: 14:00-14:30 1:1 performed family education with pt's friend [name]  on bed mobility, functional ambulation with RW, education on safe use of DME, tub bench transfers into tub, sit to stand with proper hand placement, discussed bed mobility. At the end of session pt discussing his concerns about his friend having to take care of all his needs at home so pt discussed SNF option- SW aware and came to address with them at the end of session.  Precautions/Restrictions  Precautions Precautions: Fall Restrictions Weight Bearing Restrictions: No General   Vital Signs Therapy Vitals Temp: 98.2 F (36.8 C) Temp src: Oral Pulse Rate: 63 Resp: 18 BP: 149/81 mmHg Oxygen Therapy SpO2: 96 % O2 Device: None (Room air) Pain Pain Assessment Pain Assessment: No/denies pain Vision/Perception  Vision - History Baseline Vision: Wears glasses all the time Patient Visual Report: Overshooting;Unable to keep objects in focus Vision - Assessment Vision Assessment: Vision tested Tracking/Visual Pursuits: Decreased smoothness of horizontal tracking;Decreased smoothness of vertical tracking Saccades: Additional eye shifts occurred during testing;Overshoots;Decreased speed of saccadic movement Convergence: Impaired (comment) Perception Perception: Within Functional Limits Praxis Praxis: Intact  Cognition Arousal/Alertness: Awake/alert Orientation Level: Oriented X4 Sensation Sensation Light Touch: Appears Intact Stereognosis: Appears Intact Hot/Cold: Appears Intact Proprioception: Impaired by gross  assessment Coordination Gross Motor Movements are Fluid and Coordinated: No Fine Motor Movements are Fluid and Coordinated: No Coordination and Movement Description: improved coorindation in bilateral UE, espeically with visual attention to task; increased incoordination with increased degrees of freedom; LE with decr coordination Finger Nose Finger Test: performed slow Motor  Motor Motor: Ataxia Mobility  Bed Mobility Rolling Right: 5: Supervision Right Sidelying to Sit: 5: Supervision Supine to Sit: 5: Supervision Sitting - Scoot to Edge of Bed: 5: Supervision Sit to Supine: 5: Supervision Transfers Sit to Stand: 5: Supervision Stand to Sit: 5: Supervision  Trunk/Postural Assessment  Cervical Assessment Cervical Assessment: Within Functional Limits Cervical AROM Overall Cervical AROM: Within functional limits for tasks performed Thoracic Assessment Thoracic Assessment: Within Functional Limits Lumbar Assessment Lumbar Assessment: Within Functional Limits Postural Control Postural Control: Deficits on evaluation Righting Reactions: improved but delayed Postural Limitations: lower back and hip discomfort at times   Balance Static Sitting Balance Static Sitting - Level of Assistance: 6: Modified independent (Device/Increase time) Dynamic Sitting Balance Dynamic Sitting - Level of Assistance: 5: Stand by assistance Static Standing Balance Static Standing - Level of Assistance: 5: Stand by assistance Dynamic Standing Balance Dynamic Standing - Level of Assistance: 4: Min assist Extremity/Trunk Assessment RUE Assessment RUE Assessment: Within Functional Limits LUE Assessment LUE Assessment: Within Functional Limits  See FIM for current functional status  Roney Mans [facility] 11/10/2012, 7:58 AM

## 2012-11-10 NOTE — Progress Notes (Addendum)
Speech Language Pathology Daily Session Notes  [name] MRN: [id number] Date of Birth: [birth year]  Today's Date: 11/10/2012  Session 1 Time: 1125-1140 Time Calculation (min): 15 min  Session 2 Time: 1515-1530 Time Calculation: 15 min  Short Term Goals: Week 2: SLP Short Term Goal 1 (Week 2): Pt will consume trials of Dys. 3 textures and demonstrate efficient mastication with supervision verbal cues wituout overt s/s of aspiration.  SLP Short Term Goal 2 (Week 2): Pt will consume trials of thin liquids without overt s/s of aspiration with Mod I SLP Short Term Goal 3 (Week 2): Pt will utilize swallowing compensatory strategies with supervision verbal cues to minimize overt s/s of aspiration SLP Short Term Goal 4 (Week 2): Pt will utilize speech intelligibility strategies at the phrase level with supervision verbal cues for 90% intelligibility SLP Short Term Goal 5 (Week 2): Pt will demonstrate functional problem solving for mildly complex tasks with Min A verbal cues  SLP Short Term Goal 6 (Week 2): Pt will utilize external memory aids to recall new, daily information with Mod I.   Skilled Therapeutic Interventions:  Session 1: Pt participated in co-treatment with OT in [place] with treatment focus on dysphagia and speech goals and self-feeding. Pt demonstrated increased PO intake and consumed Dys. 2 textures and thin liquids via cup and required supervision verbal cues for utilization of swallowing compensatory strategies. Pt demonstrated cough X 3 with thin liquids, suspect due to mixed consistencies. Pt also demonstrated increased social interaction and required supervision verbal cues for speech intelligibility at the phrase level.    Session 2: Pt missed first 15 minutes of session due to meeting with friends and social worker in regards to d/c plan. Pt and his friends expressed concerns several times over past few days about whether he will have adequate  assist at home and asked about other options. All are agreed with change of plan to SNF. Pt required supervision verbal cues to attend to right environment during wheelchair mobility and Min question cues for utilization of speech intelligibility strategies at the sentence level.   FIM:  Comprehension Comprehension Mode: Auditory Comprehension: 5-Understands complex 90% of the time/Cues < 10% of the time Expression Expression: 5-Expresses basic 90% of the time/requires cueing < 10% of the time. Social Interaction Social Interaction: 5-Interacts appropriately 90% of the time - Needs monitoring or encouragement for participation or interaction. Problem Solving Problem Solving: 4-Solves basic 75 - 89% of the time/requires cueing 10 - 24% of the time Memory Memory: 6-More than reasonable amt of time FIM - Eating Eating Activity: 5: Supervision/cues  Pain Pain Assessment Pain Assessment: No/denies pain  Therapy/Group: Individual Therapy and Group Therapy  Valerie Cones 11/10/2012, 12:18 PM

## 2012-11-10 NOTE — Progress Notes (Signed)
MD, glucose values are high.  If going to use 70/30 instead of home NPH, please increase to 15 units bid.   Thank you, Lenor Coffin, RN, CNS, Diabetes Coordinator [phone])

## 2012-11-11 ENCOUNTER — Inpatient Hospital Stay (HOSPITAL_COMMUNITY): Payer: Medicare Other

## 2012-11-11 ENCOUNTER — Inpatient Hospital Stay (HOSPITAL_COMMUNITY): Payer: Medicare Other | Admitting: Speech Pathology

## 2012-11-11 ENCOUNTER — Inpatient Hospital Stay (HOSPITAL_COMMUNITY): Payer: Medicare Other | Admitting: Occupational Therapy

## 2012-11-11 DIAGNOSIS — M76899 Other specified enthesopathies of unspecified lower limb, excluding foot: Secondary | ICD-10-CM

## 2012-11-11 DIAGNOSIS — I619 Nontraumatic intracerebral hemorrhage, unspecified: Secondary | ICD-10-CM

## 2012-11-11 DIAGNOSIS — I10 Essential (primary) hypertension: Secondary | ICD-10-CM

## 2012-11-11 LAB — BASIC METABOLIC PANEL
GFR calc non Af Amer: 50 mL/min — ABNORMAL LOW (ref >90)
Glucose, Bld: 160 mg/dL — ABNORMAL HIGH (ref 70–99)
Potassium: 4.2 mEq/L (ref 3.5–5.1)
Sodium: 135 mEq/L (ref 135–145)

## 2012-11-11 LAB — GLUCOSE, CAPILLARY
Glucose-Capillary: 180 mg/dL — ABNORMAL HIGH (ref 70–99)
Glucose-Capillary: 128 mg/dL — ABNORMAL HIGH (ref 70–99)

## 2012-11-11 NOTE — Progress Notes (Signed)
Occupational Therapy Session Note  [name] MRN: [id number] Date of Birth: May 26, 1935  Today's Date: 11/11/2012 Time: 0800-0855 Time Calculation (min): 55 min  Short Term Goals: Week 2:  OT Short Term Goal 1 (Week 2): STG=LTG  Skilled Therapeutic Interventions/Progress Updates:    Pt engaged in bathing at shower level in ADL apartment and dressing with sit<>stand from chair in bathroom.  Pt completed all tasks with supervision/set up only.  Pt was able to stand from lower surface of chair without assistance.  Focus on transfers, functional amb with RW to gather supplies and clothing, dynamic standing balance, and safety awareness. Pt did not report any dizziness during this session.  Therapy Documentation Precautions:  Precautions Precautions: Fall Restrictions Weight Bearing Restrictions: No Pain: Pain Assessment Pain Assessment: No/denies pain  See FIM for current functional status  Therapy/Group: Individual Therapy  Rich Brave 11/11/2012, 8:55 AM

## 2012-11-11 NOTE — Progress Notes (Signed)
Physical Therapy Session Note  Patient Details  Name: MESHILEM MACHUCA MRN: [id number] Date of Birth: Feb 25, 1935  Today's Date: 11/11/2012 Time: 1015-1055 Time Calculation (min): 40 min  Skilled Therapeutic Interventions/Progress Updates:    Session focused on dynamic gait and balance with RW through obstacle course (up to min/mod A for recovery of LOB), stair negotiation with close S, neuro re-ed for balance reactions while on compliant surface reaching for items outside BOS (min A needed when LOB occurred) and basic transfers with close S and cues for hand placement. Returned to bed end of session with close S with RW. Dizziness intermittent through session but resolved quickly with seated break.  Therapy Documentation Precautions:  Precautions Precautions: Fall Restrictions Weight Bearing Restrictions: No   Pain: Pain Assessment Pain Assessment: No/denies pain   Locomotion : Ambulation Ambulation/Gait Assistance: 4: Min assist   See FIM for current functional status  Therapy/Group: Individual Therapy  Karolee Stamps [facility] 11/11/2012, 10:57 AM

## 2012-11-11 NOTE — Progress Notes (Signed)
Occupational Therapy Session Note  [name] MRN: [id number] Date of Birth: 01/06/1935  Today's Date: 11/11/2012 Time: 1330-1400 Time Calculation (min): 30 min  Short Term Goals: Week 2:  OT Short Term Goal 1 (Week 2): STG=LTG  Skilled Therapeutic Interventions/Progress Updates:  Patient napping in bed upon arrival.  Engaged in bed mobility ambulate to bathroom with HHA to stand at toilet to urinate then RW to sink to wash hands.  Patient ambulated to dayroom with increased time and close supervision except 2 LOB with one requiring Mod assist to recover when turning to the left.  Patient watered the plants with heavy watering can then addressed fine motor coordination tasks at table top while standing.  Patient requested to rest in recliner, QRB applied and all items within reach.  Therapy Documentation Precautions:  Precautions Precautions: Fall Restrictions Weight Bearing Restrictions: No Pain: Denies pain yet describes "soreness in shoulders and hips", provided rest PRN. See FIM for current functional status  Therapy/Group: Individual Therapy  Bill Jackson 11/11/2012, 5:19 PM

## 2012-11-11 NOTE — Progress Notes (Signed)
Patient ID: Bill Jackson, male   DOB: [birth year], 78 y.o.   MRN: [id number]  Subjective/Complaints: D/c plans have changed to [facility] per SW note. Pain better. Still with balance issues which concern him A 12 point review of systems has been performed and if not noted above is otherwise negative.   Objective: Vital Signs: Blood pressure 123/84, pulse 73, temperature 97.7 F (36.5 C), temperature source Oral, resp. rate 17, height 5\' 7"  (1.702 m), weight 93.1 kg (205 lb 4 oz), SpO2 100.00%. No results found.  Recent Labs  11/09/12 0500  WBC 5.1  HGB 15.3  HCT 43.0  PLT 475*    Recent Labs  11/10/12 0618 11/11/12 0605  NA 132* 135  K 4.1 4.2  CL 94* 96  GLUCOSE 219* 160*  BUN 16 21  CREATININE 1.05 1.33  CALCIUM 9.5 9.4   CBG (last 3)   Recent Labs  11/10/12 1632 11/10/12 2104 11/11/12 0709  GLUCAP 103* 109* 180*    Wt Readings from Last 3 Encounters:  11/02/12 93.1 kg (205 lb 4 oz)  10/25/12 92.1 kg (203 lb 0.7 oz)  10/25/12 92.1 kg (203 lb 0.7 oz)    Physical Exam:  Constitutional: He is oriented to person, place, and time. He appears well-developed and well-nourished.   Marland Kitchen HENT:  Head: Normocephalic and atraumatic.  Right Ear: External ear normal.  Left Ear: External ear normal.  Eyes: Conjunctivae and EOM are normal. Pupils are equal, round, and reactive to light. Right eye exhibits no discharge. Left eye exhibits no discharge. No scleral icterus.  Dysconjugate gaze. Arcus senilis. Pinpoint pupils.  Neck: Neck supple. No JVD present. No thyromegaly present.  Limited due to posterior neck incision.  Cardiovascular: Normal rate and regular rhythm. Exam reveals no gallop and no friction rub.  No murmur heard.  Pulmonary/Chest: Effort normal and breath sounds normal. No respiratory distress. He has no wheezes. He has no rales. He exhibits no tenderness.  Abdominal: Soft. Bowel sounds are normal. He exhibits no distension. There is no tenderness. There is no  rebound.  Lymphadenopathy:  He has no cervical adenopathy.  Neurological: He is alert slightly confused.  Moderate to severe dysarthria persists.     Occasional perseverative.   Visual deficits in L>R peripheral fields, dysconjugate gaze. Mild right sided weakness with ataxia RUE and decrease in motor control RLE with ataxia as well.  . Strength remains grossly  4/5 in all 4 limbs.  Skin: Skin is warm and dry.  Musculoskeletal: decreased pain in greater trochs and surrounding ER's with palpation/pressure   Assessment/Plan: 1. 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 Physiatrist is providing close team supervision and 24 hour management of active medical problems listed below. Physiatrist and rehab team continue to assess barriers to discharge/monitor patient progress toward functional and medical goals. FIM: FIM - Bathing Bathing Steps Patient Completed: Chest;Right Arm;Left Arm;Abdomen;Front perineal area;Buttocks;Right upper leg;Left upper leg;Right lower leg (including foot);Left lower leg (including foot) Bathing: 5: Set-up assist to: Adjust water temp  FIM - Upper Body Dressing/Undressing Upper body dressing/undressing steps patient completed: Thread/unthread right sleeve of pullover shirt/dresss;Thread/unthread left sleeve of pullover shirt/dress;Put head through opening of pull over shirt/dress;Pull shirt over trunk Upper body dressing/undressing: 5: Set-up assist to: Obtain clothing/put away FIM - Lower Body Dressing/Undressing Lower body dressing/undressing steps patient completed: Thread/unthread right pants leg;Pull pants up/down;Fasten/unfasten pants;Don/Doff right  sock;Don/Doff left sock;Thread/unthread left pants leg;Don/Doff right shoe;Don/Doff left shoe;Fasten/unfasten right shoe;Fasten/unfasten left  shoe;Thread/unthread right underwear leg;Thread/unthread left underwear leg;Pull underwear up/down Lower body dressing/undressing: 5: Supervision: Safety issues/verbal cues  FIM - Toileting Toileting steps completed by patient: Adjust clothing prior to toileting;Performs perineal hygiene;Adjust clothing after toileting Toileting Assistive Devices: Grab bar or rail for support Toileting: 4: Steadying assist  FIM - Diplomatic Services operational officer Devices: Art gallery manager Transfers: 5-To toilet/BSC: Supervision (verbal cues/safety issues);5-From toilet/BSC: Supervision (verbal cues/safety issues)  FIM - Bed/Chair Transfer Bed/Chair Transfer Assistive Devices: Bed rails;Arm rests Bed/Chair Transfer: 4: Bed > Chair or W/C: Min A (steadying Pt. > 75%);4: Chair or W/C > Bed: Min A (steadying Pt. > 75%)  FIM - Locomotion: Wheelchair Distance: 150' Locomotion: Wheelchair: 5: Travels 150 ft or more: maneuvers on rugs and over door sills with supervision, cueing or coaxing FIM - Locomotion: Ambulation Ambulation/Gait Assistance: 2: Max assist Locomotion: Ambulation: 4: Travels 150 ft or more with minimal assistance (Pt.>75%)  Comprehension Comprehension Mode: Auditory Comprehension: 5-Understands complex 90% of the time/Cues < 10% of the time  Expression Expression Mode: Verbal Expression Assistive Devices: 6-Communication board Expression: 5-Expresses basic 90% of the time/requires cueing < 10% of the time.  Social Interaction Social Interaction: 5-Interacts appropriately 90% of the time - Needs monitoring or encouragement for participation or interaction.  Problem Solving Problem Solving: 4-Solves basic 75 - 89% of the time/requires cueing 10 - 24% of the time  Memory Memory Mode: Not assessed Memory: 6-More than reasonable amt of time  Medical Problem List and Plan:  1. DVT Prophylaxis/Anticoagulation: Mechanical: Sequential compression devices, below knee Bilateral lower  extremities  2. Headaches/pain:  Overall much improved  - trochanteric bursitis bilaterally  -lumbar xrays unremarkable  -ice/voltaren gel. Continue to work on ROM, avoid sleeping on sides 3. Mood: Seems to be in good spirits.  -in good spirits.  4. Neuropsych: This patient is capable of making decisions on his/her own behalf.  5. HTN: Continue hctz,cozaar. Monitoring renal status   6. DM type 2: Was on 70/30 insulin additionally at home-Hgb A1C @ 6.7. Monitor BS with AC/HS cbg checks.Improving Continue metformin and glipizide--change back to 10mg  bid and reintroduce insulin 5u bid  -dietary ed 7. H/o Alcohol use:  dc'ed thiamine 8. FEN:  Potassium replacement--level normal.   -slightly hyponatremic now---recheck in am.  Consider stopping hctz  9. Dysphagia: Needs supervision for meals. Encourage fluids  -diet upgraded 10. Confusion: resolved--at baseline  -ua negative, culture negative      LOS (Days) 14 A FACE TO FACE EVALUATION WAS PERFORMED  KIRSTEINS,ANDREW E 11/11/2012 8:03 AM

## 2012-11-11 NOTE — Progress Notes (Signed)
Speech Language Pathology Daily Session Notes  [name] MRN: [id number] Date of Birth: [birth year]  Today's Date: 11/11/2012  Session 1 Time:0930-1015 Time Calculation: 45 min  Session 2 Time: 1130-1145 Time Calculation (min): 15 min  Short Term Goals: Week 2: SLP Short Term Goal 1 (Week 2): Pt will consume trials of Dys. 3 textures and demonstrate efficient mastication with supervision verbal cues wituout overt s/s of aspiration.  SLP Short Term Goal 2 (Week 2): Pt will consume trials of thin liquids without overt s/s of aspiration with Mod I SLP Short Term Goal 3 (Week 2): Pt will utilize swallowing compensatory strategies with supervision verbal cues to minimize overt s/s of aspiration SLP Short Term Goal 4 (Week 2): Pt will utilize speech intelligibility strategies at the phrase level with supervision verbal cues for 90% intelligibility SLP Short Term Goal 5 (Week 2): Pt will demonstrate functional problem solving for mildly complex tasks with Min A verbal cues  SLP Short Term Goal 6 (Week 2): Pt will utilize external memory aids to recall new, daily information with Mod I.   Skilled Therapeutic Interventions:  Session 1: Treatment focus on speech goals. Pt participated in word description task with focus on speech intelligibility at the phrase and sentence level. Pt was ~90% intelligible throughout the task but required supervision verbal cues to utilize diaphragmatic breathing to increase vocal intensity.   Session 2: Pt participated in co-treatment with OT in [employer] with treatment focus on dysphagia and speech goals and self-feeding. Pt consumed Dys. 2 textures and thin liquids via cup and required supervision verbal cues for utilization of swallowing compensatory strategies. Pt demonstrated cough X 1 with thin liquids, suspect due to mixed consistencies. Pt with limited social interaction throughout the session, pt reported he was not feeling well.     FIM:  Comprehension Comprehension Mode: Auditory Comprehension: 5-Follows basic conversation/direction: With no assist Expression Expression: 5-Expresses basic 90% of the time/requires cueing < 10% of the time. Social Interaction Social Interaction: 4-Interacts appropriately 75 - 89% of the time - Needs redirection for appropriate language or to initiate interaction. Problem Solving Problem Solving: 5-Solves basic 90% of the time/requires cueing < 10% of the time Memory Memory: 6-More than reasonable amt of time FIM - Eating Eating Activity: 5: Supervision/cues  Pain Pain Assessment Pain Assessment: No/denies pain  Therapy/Group: Individual Therapy and Group Therapy  Shanquita Ronning 11/11/2012, 1:13 PM

## 2012-11-11 NOTE — Progress Notes (Signed)
Occupational Therapy Note  [name] MRN: [id number] Date of Birth: [birth year] Today's Date: 11/11/2012 Time: 1145-1200 (15 min)  Pt seen for [employer], self-feeding group, with SLP with focus on self-feeding, swallowing strategies and task initiation. Pt was noted to spontaneously use LUE as assist with opening containers. Pt with min verbal cues for swallowing strategies and encouragement for PO intake. Pt with decreased PO intake this session and decreased engagement in socialization.  Pt with no c/o pain this session.  Leonette Monarch 11/11/2012, 12:09 PM

## 2012-11-12 ENCOUNTER — Inpatient Hospital Stay (HOSPITAL_COMMUNITY): Payer: Medicare Other | Admitting: *Deleted

## 2012-11-12 ENCOUNTER — Inpatient Hospital Stay (HOSPITAL_COMMUNITY): Payer: Medicare Other | Admitting: Speech Pathology

## 2012-11-12 DIAGNOSIS — I10 Essential (primary) hypertension: Secondary | ICD-10-CM

## 2012-11-12 DIAGNOSIS — E876 Hypokalemia: Secondary | ICD-10-CM

## 2012-11-12 DIAGNOSIS — E119 Type 2 diabetes mellitus without complications: Secondary | ICD-10-CM

## 2012-11-12 LAB — GLUCOSE, CAPILLARY
Glucose-Capillary: 200 mg/dL — ABNORMAL HIGH (ref 70–99)
Glucose-Capillary: 140 mg/dL — ABNORMAL HIGH (ref 70–99)
Glucose-Capillary: 148 mg/dL — ABNORMAL HIGH (ref 70–99)

## 2012-11-12 NOTE — Progress Notes (Signed)
Patient ID: LENTON GENDREAU, male   DOB: 1934-08-19, 78 y.o.   MRN: [id number]  NICOLAOS MITRANO is a 78 y.o. male October 19, 1934 [id number]  Subjective: No new complaints. No new problems. Slept well. Feeling OK.  Objective: Vital signs in last 24 hours: Temp:  [98 F (36.7 C)-98.1 F (36.7 C)] 98.1 F (36.7 C) (04/12 0511) Pulse Rate:  [70-98] 70 (04/12 0511) Resp:  [18-20] 20 (04/12 0511) BP: (129-130)/(78-81) 130/78 mmHg (04/12 0511) SpO2:  [94 %-99 %] 99 % (04/12 0511) Weight change:  Last BM Date: 11/11/12  Intake/Output from previous day: 04/11 0701 - 04/12 0700 In: 120 [P.O.:120] Out: 200 [Urine:200] Last cbgs: CBG (last 3)   Recent Labs  11/11/12 1631 11/11/12 2123 11/12/12 0736  GLUCAP 128* 126* 197*     Physical Exam General: No apparent distress   w/c HEENT: moist mucosa Lungs: Normal effort. Lungs clear to auscultation, no crackles or wheezes. Cardiovascular: Regular rate and rhythm, no edema Musculoskeletal:  No change from before Neurological: No new neurological deficits Wounds: N/A    Skin: clear Alert, cooperative   Lab Results: BMET    Component Value Date/Time   NA 135 11/11/2012 0605   K 4.2 11/11/2012 0605   CL 96 11/11/2012 0605   CO2 28 11/11/2012 0605   GLUCOSE 160* 11/11/2012 0605   BUN 21 11/11/2012 0605   CREATININE 1.33 11/11/2012 0605   CALCIUM 9.4 11/11/2012 0605   GFRNONAA 50* 11/11/2012 0605   GFRAA 58* 11/11/2012 0605   CBC    Component Value Date/Time   WBC 5.1 11/09/2012 0500   RBC 4.86 11/09/2012 0500   HGB 15.3 11/09/2012 0500   HCT 43.0 11/09/2012 0500   PLT 475* 11/09/2012 0500   MCV 88.5 11/09/2012 0500   MCH 31.5 11/09/2012 0500   MCHC 35.6 11/09/2012 0500   RDW 13.5 11/09/2012 0500   LYMPHSABS 1.9 10/31/2012 0500   MONOABS 0.5 10/31/2012 0500   EOSABS 0.0 10/31/2012 0500   BASOSABS 0.0 10/31/2012 0500    Studies/Results: No results found.  Medications: I have reviewed the patient's current medications.  Assessment/Plan:  1. DVT  Prophylaxis/Anticoagulation: Mechanical: Sequential compression devices, below knee Bilateral lower extremities  2. Headaches/pain: Overall much improved  - trochanteric bursitis bilaterally  -lumbar xrays unremarkable  -ice/voltaren gel. Continue to work on ROM, avoid sleeping on sides  3. Mood: Seems to be in good spirits.  -in good spirits.  4. Neuropsych: This patient is capable of making decisions on his/her own behalf.  5. HTN: Continue hctz,cozaar. Monitoring renal status  6. DM type 2: Was on 70/30 insulin additionally at home-Hgb A1C @ 6.7. Monitor BS with AC/HS cbg checks.Improving Continue metformin and glipizide--change back to 10mg  bid and reintroduce insulin 5u bid  -dietary ed; cont Rx 7. H/o Alcohol use: dc'ed thiamine  8. FEN: Potassium replacement--level normal.  -slightly hyponatremic now---recheck in am. Consider stopping hctz  9. Dysphagia: Needs supervision for meals. Encourage fluids  -diet upgraded  10. Confusion: resolved--at baseline  -ua negative, culture negative     Length of stay, days: 15  Sonda Primes , MD 11/12/2012, 9:05 AM

## 2012-11-12 NOTE — Progress Notes (Signed)
Physical Therapy Session Note  [name] MRN: [id number] Date of Birth: July 05, 1935  Today's Date: 11/12/2012 Time: 1005-1036 Time Calculation (min): 31 min  Skilled Therapeutic Interventions/Progress Updates:  Tx focused on gait training in busy environment, balance training, and functional mobility.   Pt propelled WC 2x150' with S and cues for brake use 25% of the time. Sit<>stand with Min A and cues for scooting to edge and anterior translation. Pt pushing with back of LEs to stand and posterior lean. Performed serial sit<>stand x8 with instruction on hand placement and technique for improved balance with transfers.   Gait training in controlled environment and in busy environment with many obstacles to navigate, 4x50' with rest breaks due to fatigue, Min A for balance. Pt needing cues for increased BOS, RW proximity, and foot clearance. Pt able to make adjustments, but not maintain, having several LOB to R side and generally unsteady, needing Min A.    Pt fatigued, wanting to return to bed, needing HHA for stand-step transfer and S for sit>supine. Bed alarm on and all needs in reach.      Therapy Documentation Precautions:  Precautions Precautions: Fall Restrictions Weight Bearing Restrictions: No   Pain: None     Locomotion : Ambulation Ambulation/Gait Assistance: 4: Min assist Wheelchair Mobility Distance: 150   See FIM for current functional status  Therapy/Group: Individual Therapy  Clydene Laming, PT, DPT  11/12/2012, 11:00 AM

## 2012-11-12 NOTE — Progress Notes (Signed)
Speech Language Pathology Daily Session Note  [name] MRN: [id number] Date of Birth: 05-22-1935  Today's Date: 11/12/2012 Time: 1140-1210 Time Calculation (min): 30 min  Short Term Goals: Week 2: SLP Short Term Goal 1 (Week 2): Pt will consume trials of Dys. 3 textures and demonstrate efficient mastication with supervision verbal cues wituout overt s/s of aspiration.  SLP Short Term Goal 2 (Week 2): Pt will consume trials of thin liquids without overt s/s of aspiration with Mod I SLP Short Term Goal 3 (Week 2): Pt will utilize swallowing compensatory strategies with supervision verbal cues to minimize overt s/s of aspiration SLP Short Term Goal 4 (Week 2): Pt will utilize speech intelligibility strategies at the phrase level with supervision verbal cues for 90% intelligibility SLP Short Term Goal 5 (Week 2): Pt will demonstrate functional problem solving for mildly complex tasks with Min A verbal cues  SLP Short Term Goal 6 (Week 2): Pt will utilize external memory aids to recall new, daily information with Mod I.   Skilled Therapeutic Interventions: Skilled therapy was performed during [employer], with focus on dysphagia and self-feeding.  He required moderate verbal cues to swallow x2 after each bite/sip as well as intermittent throat clear, and to alternate bites with sips.  He had no overt s/s of aspiration.  Patient had limited social interaction with rest of group.       FIM:  Comprehension Comprehension Mode: Auditory Comprehension: 5-Follows basic conversation/direction: With no assist Expression Expression Mode: Verbal Expression: 5-Expresses basic needs/ideas: With no assist Social Interaction Social Interaction: 4-Interacts appropriately 75 - 89% of the time - Needs redirection for appropriate language or to initiate interaction. Problem Solving Problem Solving: 5-Solves basic 90% of the time/requires cueing < 10% of the time Memory Memory:  5-Recognizes or recalls 90% of the time/requires cueing < 10% of the time FIM - Eating Eating Activity: 5: Supervision/cues  Pain Pain Assessment Pain Assessment: No/denies pain  Therapy/Group: Group Therapy  [name] 11/12/2012, 4:26 PM

## 2012-11-13 ENCOUNTER — Inpatient Hospital Stay (HOSPITAL_COMMUNITY): Payer: Medicare Other | Admitting: *Deleted

## 2012-11-13 LAB — GLUCOSE, CAPILLARY: Glucose-Capillary: 165 mg/dL — ABNORMAL HIGH (ref 70–99)

## 2012-11-13 NOTE — Progress Notes (Signed)
Patient ID: Bill Jackson, male   DOB: 07-28-1935, 78 y.o.   MRN: [id number]  Bill Jackson is a 78 y.o. male 05/02/1935 [id number]  Subjective: No new complaints. No new problems. Slept well.   Objective: Vital signs in last 24 hours: Temp:  [98.1 F (36.7 C)] 98.1 F (36.7 C) (04/13 0516) Pulse Rate:  [71-80] 80 (04/13 0516) Resp:  [18] 18 (04/13 0516) BP: (115-123)/(71-85) 123/85 mmHg (04/13 0516) SpO2:  [96 %-99 %] 96 % (04/13 0516) Weight change:  Last BM Date: 11/12/12  Intake/Output from previous day: 04/12 0701 - 04/13 0700 In: 600 [P.O.:600] Out: -  Last cbgs: CBG (last 3)   Recent Labs  11/12/12 1651 11/12/12 2043 11/13/12 0827  GLUCAP 140* 148* 222*     Physical Exam General: No apparent distress   w/c HEENT: moist mucosa Lungs: Normal effort. Lungs clear to auscultation, no crackles or wheezes. Cardiovascular: Regular rate and rhythm, no edema Musculoskeletal:  No change from before Neurological: No new neurological deficits Wounds: N/A    Skin: clear Alert, cooperative   Lab Results: BMET    Component Value Date/Time   NA 135 11/11/2012 0605   K 4.2 11/11/2012 0605   CL 96 11/11/2012 0605   CO2 28 11/11/2012 0605   GLUCOSE 160* 11/11/2012 0605   BUN 21 11/11/2012 0605   CREATININE 1.33 11/11/2012 0605   CALCIUM 9.4 11/11/2012 0605   GFRNONAA 50* 11/11/2012 0605   GFRAA 58* 11/11/2012 0605   CBC    Component Value Date/Time   WBC 5.1 11/09/2012 0500   RBC 4.86 11/09/2012 0500   HGB 15.3 11/09/2012 0500   HCT 43.0 11/09/2012 0500   PLT 475* 11/09/2012 0500   MCV 88.5 11/09/2012 0500   MCH 31.5 11/09/2012 0500   MCHC 35.6 11/09/2012 0500   RDW 13.5 11/09/2012 0500   LYMPHSABS 1.9 10/31/2012 0500   MONOABS 0.5 10/31/2012 0500   EOSABS 0.0 10/31/2012 0500   BASOSABS 0.0 10/31/2012 0500    Studies/Results: No results found.  Medications: I have reviewed the patient's current medications.  Assessment/Plan:  1. DVT Prophylaxis/Anticoagulation: Mechanical:  Sequential compression devices, below knee Bilateral lower extremities  2. Headaches/pain: Overall much improved  - trochanteric bursitis bilaterally  -lumbar xrays unremarkable  -ice/voltaren gel. Continue to work on ROM, avoid sleeping on sides  3. Mood: Seems to be in good spirits.  -in good spirits.  4. Neuropsych: This patient is capable of making decisions on his/her own behalf.  5. HTN: Continue hctz,cozaar. Monitoring renal status  6. DM type 2: Was on 70/30 insulin additionally at home-Hgb A1C @ 6.7. Monitor BS with AC/HS cbg checks.Improving Continue metformin and glipizide--change back to 10mg  bid and reintroduce insulin 5u bid  -dietary ed; cont Rx 7. H/o Alcohol use: dc'ed thiamine  8. FEN: Potassium replacement--level normal.  -slightly hyponatremic now---recheck in am. Consider stopping hctz  9. Dysphagia: Needs supervision for meals. Encourage fluids  -diet upgraded  10. Confusion: resolved--at baseline  -ua negative, culture negative     Length of stay, days: 16  Sonda Primes , MD 11/13/2012, 9:09 AM

## 2012-11-13 NOTE — Progress Notes (Signed)
Occupational Therapy Note  [name] MRN: [id number] Date of Birth: [birth year] Today's Date: 11/13/2012   [id number] ( ) Pain:  None Individual session  Pt. Lying in bed upon OT arrival.  Addressed bed mobility, sit to stand, transfers, standing balance, dynamic sitting balance.  Pt.was supervision getting to EOB.  Used the RW to ambulate to the toilet.  Pt.had urine and BM and was independent with toileting using lateral leans for cleaning.  Pt. Ambulated to shower.  Had one anterior LOB with a stepping response and moderate assist for regaiining.  Pt. Washed self and with verbal cues dried self thoroughly.  Ambulated to wc and dressed with assist with teds only and verbal cues to open shoe so foot goes in easier.  Pt. propelled self to nursing station with QRB on and parked awaiting blood sugar draw and breakfast with supervision.      Humberto Seals 11/13/2012, 7:44 AM

## 2012-11-14 ENCOUNTER — Inpatient Hospital Stay (HOSPITAL_COMMUNITY): Payer: Medicare Other | Admitting: Physical Therapy

## 2012-11-14 ENCOUNTER — Encounter (HOSPITAL_COMMUNITY): Payer: Medicare Other | Admitting: Occupational Therapy

## 2012-11-14 ENCOUNTER — Inpatient Hospital Stay (HOSPITAL_COMMUNITY): Payer: Medicare Other | Admitting: Speech Pathology

## 2012-11-14 LAB — GLUCOSE, CAPILLARY
Glucose-Capillary: 192 mg/dL — ABNORMAL HIGH (ref 70–99)
Glucose-Capillary: 248 mg/dL — ABNORMAL HIGH (ref 70–99)

## 2012-11-14 MED ORDER — POTASSIUM CHLORIDE CRYS ER 10 MEQ PO TBCR
10.0000 meq | EXTENDED_RELEASE_TABLET | Freq: Every day | ORAL | Status: DC
  Administered 2012-11-15: 10 meq via ORAL
  Filled 2012-11-14 (×3): qty 1

## 2012-11-14 MED ORDER — INSULIN ASPART PROT & ASPART (70-30 MIX) 100 UNIT/ML ~~LOC~~ SUSP
10.0000 [IU] | Freq: Two times a day (BID) | SUBCUTANEOUS | Status: DC
  Administered 2012-11-14 – 2012-11-15 (×2): 10 [IU] via SUBCUTANEOUS

## 2012-11-14 NOTE — Progress Notes (Signed)
Occupational Therapy Discharge Summary  [name] MRN: [id number] Date of Birth: 1934-09-23  Today's Date: 11/14/2012  Patient has met 7 of 7 long term goals due to improved activity tolerance, improved balance, postural control, ability to compensate for deficits, functional use of  RIGHT upper, RIGHT lower, LEFT upper and LEFT lower extremity and improved coordination.  Patient to discharge at overall supervision for basic ADL tasks and min A for functional ambulation and dynamic standing tasks level. Pt continues to demonstrate Diplopia, vestibular issues: difficulty with scanning, convergence and coordination of head -eye movements, saccades, smooth pursuits and motion sensitivity .  Friends and family unable to provide the level of care this pt needs at this time so pt would benefit from further skilled intervention. Reasons goals not met: n/a  Recommendation:  Patient will benefit from ongoing skilled OT services in skilled nursing facility setting to continue to advance functional skills in the area of BADL and iADL.  Equipment: No equipment provided  Reasons for discharge: treatment goals met and discharge from hospital  Patient/family agrees with progress made and goals achieved: Yes  OT Discharge Pain Pain  Pain Assessment  Pain Assessment: No/denies pain  Vision/Perception  Vision - History  Baseline Vision: Wears glasses all the time  Patient Visual Report: Overshooting;Unable to keep objects in focus  Vision - Assessment  Vision Assessment: Vision tested  Tracking/Visual Pursuits: Decreased smoothness of horizontal tracking;Decreased smoothness of vertical tracking  Saccades: Additional eye shifts occurred during testing;Overshoots;Decreased speed of saccadic movement  Convergence: Impaired (comment) - Diplopia in all fields Perception  Perception: Within Functional Limits  Praxis  Praxis: Intact  Sensation  Sensation  Light Touch:  Appears Intact  Stereognosis: Appears Intact  Hot/Cold: Appears Intact  Proprioception: Impaired by gross assessment  Coordination  Gross Motor Movements are Fluid and Coordinated: No  Fine Motor Movements are Fluid and Coordinated: No  Coordination and Movement Description: improved coorindation in bilateral UE, espeically with visual attention to task; increased incoordination with increased degrees of freedom; LE with decr coordination  Finger Nose Finger Test: performed slow  Motor  Motor  Motor: Ataxia  Mobility  Bed Mobility  Rolling Right: 5: Supervision  Right Sidelying to Sit: 5: Supervision  Supine to Sit: 5: Supervision  Sitting - Scoot to Edge of Bed: 5: Supervision  Sit to Supine: 5: Supervision  Transfers  Sit to Stand: 5: Supervision  Stand to Sit: 5: Supervision  Trunk/Postural Assessment  Cervical Assessment  Cervical Assessment: Within Functional Limits  Cervical AROM  Overall Cervical AROM: Within functional limits for tasks performed  Thoracic Assessment  Thoracic Assessment: Within Functional Limits  Lumbar Assessment  Lumbar Assessment: Within Functional Limits  Postural Control  Postural Control: Deficits on evaluation  Righting Reactions: improved but delayed  Postural Limitations: lower back and hip discomfort at times  Balance  Static Sitting Balance  Static Sitting - Level of Assistance: 6: Modified independent (Device/Increase time)  Dynamic Sitting Balance  Dynamic Sitting - Level of Assistance: 5: Stand by assistance  Static Standing Balance  Static Standing - Level of Assistance: 5: Stand by assistance  Dynamic Standing Balance  Dynamic Standing - Level of Assistance: 4: Min assist  Extremity/Trunk Assessment  RUE Assessment  RUE Assessment: Within Functional Limits  LUE Assessment  LUE Assessment: Within Functional Limits  See FIM for current functional status     Cognition Overall Cognitive Status: Appears within functional limits  for tasks assessed Arousal/Alertness: Awake/alert Attention: Selective  Sustained Attention: Appears intact Selective Attention: Appears intact Memory: Appears intact Awareness: Appears intact Problem Solving: Appears intact Decision Making: Appears intact Safety/Judgment: Appears intact       See FIM for current functional status  Roney Mans [facility] 11/14/2012, 3:37 PM

## 2012-11-14 NOTE — Progress Notes (Signed)
Speech Language Pathology Session Notes & Discharge Summary  [name] MRN: [id number] Date of Birth: May 27, 1935  Today's Date: 11/14/2012  Session 1 Time: 1015-1100 Time Calculation: 45 min   Session 2 Time: 3151-7616 Time Calculation (min): 20 min  Skilled Therapeutic Intervention:   Session 1: Treatment focus on speech intelligibility and visual scanning. Pt participated in functional conversation and required Min verbal cues for a slow rate and over articulation for increased intelligibly. Pt also participated in reading task with utilization of an eye patch to decreased diplopia to increase scanning, however, the pt was unable to tolerate use of the eye patch and unable to participate in reading task. Session 2: Pt participated in co-treatment with OT in [employer] with focus on dysphagia and speech goals and self-feeding. Pt required min verbal cues for utilization of swallowing strategies and encouragement for PO intake. Pt with minimal PO intake this session. Pt with increased recall of prior discussion with SLP regarding d/c plan. Pt was ~90% intelligible throughout the session at the phrase level.   Patient has met 6 of 6 long term goals.  Patient to discharge at overall Supervision level.   Reasons goals not met: N/A   Clinical Impression/Discharge Summary: Pt has made functional gains and has met all LTG's this admission. Currently, pt is overall Min A for utilization of speech intelligibility strategies and is ~90% intelligible at the phrase level. Pt is also currently consuming Dys. 2 textures with thin liquids and requires Min A verbal cues for utilization of swallowing compensatory strategies of multiple swallows, alternating solids and liquids and an intermittent throat clear. Pt 's overall cognitive function is inconsistent at times but is overall at baseline but can be impacted by visual deficits. Pt's care partner is unable to provide the  necessary assistance at this time, therefore, pt will discharge to a SNF for continued skilled SLP intervention to maximize functional communication, swallowing function with least restrictive diet and overall functional independence.   Care Partner:  Caregiver Able to Provide Assistance: No  Type of Caregiver Assistance: Physical  Recommendation:  Skilled Nursing facility  Rationale for SLP Follow Up: Maximize swallowing safety;Maximize functional communication;Reduce caregiver burden   Equipment: N/A   Reasons for discharge: Treatment goals met;Discharged from hospital   Patient/Family Agrees with Progress Made and Goals Achieved: Yes   See FIM for current functional status  Phinehas Grounds 11/14/2012, 3:37 PM

## 2012-11-14 NOTE — Progress Notes (Signed)
Physical Therapy Note  Patient Details  Name: TIMONTHY HOVATER MRN: [id number] Date of Birth: 1934-11-22 Today's Date: 11/14/2012  Time: 900-945 45 minutes  No c/o pain.  Pt continues with c/o dizziness with head turns R and supine <> sit, vestibular eval to be initiated.  Gait training in controlled and household environments with obstacle negotiation and uneven surfaces with min A.  Pt with several LOB with uneven surfaces and with head turns to the R, requires min-mod A to correct.  Standing balance on foam with horseshoe toss with min steadying assist for static balance, min-mod A for reaching out of BOS.  Attempt tracking and gaze stabilization, pt with reports of double vision with eye tracking laterally.  Vestibular eval initiated by OT.  Individual therapy   DONAWERTH,KAREN 11/14/2012, 10:38 AM

## 2012-11-14 NOTE — Progress Notes (Signed)
Occupational Therapy Note  [name] MRN: [id number] Date of Birth: 07/31/1935 Today's Date: 11/14/2012 Time: 1130-1145 (15 min)  Pt seen for [employer], self-feeding group, with SLP with focus on BUE use with self-feeding and setup, task initiation, swallowing strategies, and attention to task. Pt with min verbal cues for swallowing strategies and encouragement for PO intake. Pt with minimal PO intake this session. Pt with increased recall of prior discussion with SLP regarding d/c plan.  Pt with no c/o pain this session.  Leonette Monarch 11/14/2012, 12:31 PM

## 2012-11-14 NOTE — Progress Notes (Signed)
Patient ID: Bill Jackson, male   DOB: 1934-08-07, 78 y.o.   MRN: [id number]  Subjective/Complaints: Visual changes with movement A 12 point review of systems has been performed and if not noted above is otherwise negative.   Objective: Vital Signs: Blood pressure 113/77, pulse 81, temperature 98.3 F (36.8 C), temperature source Oral, resp. rate 18, height 5\' 7"  (1.702 m), weight 93.1 kg (205 lb 4 oz), SpO2 97.00%. No results found. No results found for this basename: WBC, HGB, HCT, PLT,  in the last 72 hours No results found for this basename: NA, K, CL, CO, GLUCOSE, BUN, CREATININE, CALCIUM,  in the last 72 hours CBG (last 3)   Recent Labs  11/13/12 1652 11/13/12 2117 11/14/12 0711  GLUCAP 142* 165* 192*    Wt Readings from Last 3 Encounters:  11/02/12 93.1 kg (205 lb 4 oz)  10/25/12 92.1 kg (203 lb 0.7 oz)  10/25/12 92.1 kg (203 lb 0.7 oz)    Physical Exam:  Constitutional: He is oriented to person, place, and time. He appears well-developed and well-nourished.   Marland Kitchen HENT:  Head: Normocephalic and atraumatic.  Right Ear: External ear normal.  Left Ear: External ear normal.  Eyes: Conjunctivae and EOM are normal. Pupils are equal, round, and reactive to light. Right eye exhibits no discharge. Left eye exhibits no discharge. No scleral icterus.  Dysconjugate gaze. Arcus senilis. Pinpoint pupils.  Neck: Neck supple. No JVD present. No thyromegaly present.  Limited due to posterior neck incision.  Cardiovascular: Normal rate and regular rhythm. Exam reveals no gallop and no friction rub.  No murmur heard.  Pulmonary/Chest: Effort normal and breath sounds normal. No respiratory distress. He has no wheezes. He has no rales. He exhibits no tenderness.  Abdominal: Soft. Bowel sounds are normal. He exhibits no distension. There is no tenderness. There is no rebound.  Lymphadenopathy:  He has no cervical adenopathy.  Neurological: He is alert slightly confused.  Moderate to  severe dysarthria persists.     Occasional perseverative.   Visual deficits in L>R peripheral fields, dysconjugate gaze. Mild right sided weakness with ataxia RUE and decrease in motor control RLE with ataxia as well.  . Strength remains grossly  4/5 in all 4 limbs.  Skin: Skin is warm and dry.  Musculoskeletal: decreased pain in greater trochs and surrounding ER's with palpation/pressure   Assessment/Plan: 1. 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 Physiatrist is providing close team supervision and 24 hour management of active medical problems listed below. Physiatrist and rehab team continue to assess barriers to discharge/monitor patient progress toward functional and medical goals. FIM: FIM - Bathing Bathing Steps Patient Completed: Chest;Right Arm;Left Arm;Abdomen;Front perineal area;Buttocks;Right upper leg;Left upper leg;Right lower leg (including foot);Left lower leg (including foot) Bathing: 5: Set-up assist to: Adjust water temp  FIM - Upper Body Dressing/Undressing Upper body dressing/undressing steps patient completed: Thread/unthread right sleeve of pullover shirt/dresss;Thread/unthread left sleeve of pullover shirt/dress;Put head through opening of pull over shirt/dress;Pull shirt over trunk Upper body dressing/undressing: 5: Set-up assist to: Obtain clothing/put away FIM - Lower Body Dressing/Undressing Lower body dressing/undressing steps patient completed: Thread/unthread right pants leg;Pull pants up/down;Fasten/unfasten pants;Don/Doff right sock;Don/Doff left sock;Thread/unthread left pants leg;Don/Doff right shoe;Don/Doff left shoe;Fasten/unfasten right shoe;Fasten/unfasten left shoe;Thread/unthread right underwear leg;Thread/unthread left underwear leg;Pull underwear up/down Lower body  dressing/undressing: 4: Steadying Assist  FIM - Toileting Toileting steps completed  by patient: Adjust clothing prior to toileting;Adjust clothing after toileting;Performs perineal hygiene Toileting Assistive Devices: Grab bar or rail for support Toileting: 5: Supervision: Safety issues/verbal cues  FIM - Diplomatic Services operational officer Devices: Grab bars Toilet Transfers: 4-To toilet/BSC: Min A (steadying Pt. > 75%);4-From toilet/BSC: Min A (steadying Pt. > 75%)  FIM - Banker Devices: Walker;Arm rests Bed/Chair Transfer: 4: Bed > Chair or W/C: Min A (steadying Pt. > 75%);4: Chair or W/C > Bed: Min A (steadying Pt. > 75%)  FIM - Locomotion: Wheelchair Distance: 150 Locomotion: Wheelchair: 5: Travels 150 ft or more: maneuvers on rugs and over door sills with supervision, cueing or coaxing FIM - Locomotion: Ambulation Locomotion: Ambulation Assistive Devices: Designer, industrial/product Ambulation/Gait Assistance: 4: Min assist Locomotion: Ambulation: 2: Travels 50 - 149 ft with minimal assistance (Pt.>75%)  Comprehension Comprehension Mode: Auditory Comprehension: 5-Follows basic conversation/direction: With no assist  Expression Expression Mode: Verbal Expression Assistive Devices: 6-Communication board Expression: 5-Expresses basic needs/ideas: With no assist  Social Interaction Social Interaction: 4-Interacts appropriately 75 - 89% of the time - Needs redirection for appropriate language or to initiate interaction.  Problem Solving Problem Solving: 5-Solves basic 90% of the time/requires cueing < 10% of the time  Memory Memory Mode: Not assessed Memory: 5-Recognizes or recalls 90% of the time/requires cueing < 10% of the time  Medical Problem List and Plan:  1. DVT Prophylaxis/Anticoagulation: Mechanical: Sequential compression devices, below knee Bilateral lower extremities  2. Headaches/pain:  Overall much improved  -  trochanteric bursitis bilaterally improved  -lumbar xrays unremarkable  -ice/voltaren gel. Continue to work on ROM, avoid sleeping on sides 3. Mood: Seems to be in good spirits.  -in good spirits.  4. Neuropsych: This patient is capable of making decisions on his/her own behalf.  5. HTN: Continue hctz,cozaar. Monitoring renal status   6. DM type 2: Was on 70/30 insulin additionally at home-Hgb A1C @ 6.7. Monitor BS with AC/HS cbg checks.Improving Continue metformin and glipizide--change back to 10mg  bid and reintroduce insulin 5u bid  -dietary ed 7. H/o Alcohol use:  dc'ed thiamine 8. FEN:  Potassium replacement--level normal.   -slightly hyponatremic now---recheck in am.  Consider stopping hctz  9. Dysphagia: Needs supervision for meals. Encourage fluids  -diet upgraded 10. Confusion: resolved--at baseline  -ua negative, culture negative      LOS (Days) 17 A FACE TO FACE EVALUATION WAS PERFORMED  Sebastyan Snodgrass E 11/14/2012 8:01 AM

## 2012-11-14 NOTE — Progress Notes (Signed)
Occupational Therapy Session Note  [name] MRN: [id number] Date of Birth: 04/02/1935  Today's Date: 11/14/2012 Time: 0730-0830 Time Calculation (min): 60 min  Short Term Goals: Week 2:  OT Short Term Goal 1 (Week 2): STG=LTG  Skilled Therapeutic Interventions/Progress Updates:    1:1 self care retraining including grooming at the sink and showering in the ADL apartment. Pt required A to shave due to visual impairments and unable to see to perform thoroughly. Continue to focus on compensatory strategies of turing head and looking before turning body to decreased onset of dizziness- pt with extreme difficulty. Continue focus on sit to stands, standing balance, RW safety with functional ambulation, simple problem solving. Pt concerned this am about his car and its whereabouts.    Therapy Documentation Precautions:  Precautions Precautions: Fall Restrictions Weight Bearing Restrictions: No Pain:   no c/o pain  See FIM for current functional status  Therapy/Group: Individual Therapy  Roney Mans [facility] 11/14/2012, 8:23 AM

## 2012-11-14 NOTE — Progress Notes (Signed)
Occupational Therapy Note  [name] MRN: [id number] Date of Birth: 03/25/1935 Today's Date: 11/14/2012  Time In:  09:45  Time Out: 10:15.  Patient seen by request of team for vestibular assessment.  Patient with right cerebellar hemorrhagic stroke, s/p evacuation of hematoma.  Upon assessment patient presents with:  1.  Impaired ocular alignment 2.  Impaired oculomotor control (right eye with poor ocular motor control)  3.  Impaired smooth pursuits 4.  Impaired saccadic movement 5.  Impaired vestibular ocular reflex (slow and fast) 6.  Impaired gaze stabilization, especially with rapid smooth pursuits and vertical head movements with NEAR targets resulting in dysequilibrium. 7. Diplopia 8.  Impaired motion sensitivity with rolling right, head turns right, sit to supine, supine to sit and sit to stand.  Head movements also impaired in standing and walking resulting in central vertigo 9.  Visual-vestibular integration dysfunction especially with vertical head movements with near targets resulting in dysequilibrium. 10.  Right bias in standing and walking resulting in dysequilibrium 10.  Resolving ataxia   Patient will benefit from: 1.  Ocular motor exercises 2.  Smooth pursuits exercises 3.  Saccadic exercises 4.  Gaze stabilization exercises with emphasis on vertical head movements 5.  Habituation exercises to decrease motion sensitivity with emphasis on head turns right, rolling right, supine to sit, sit to supine and sit to stand. 6.  Teach surface orientation to decrease vertigo symptoms 7.  Visual-vestibular integration exercises to improve disequilibrium.  Exercise and vestibular treatment plan to be provided to primary therapists.  I am happy to follow as needed. Norton Pastel 11/14/2012, 10:23 AM

## 2012-11-14 NOTE — Discharge Summary (Signed)
Physician Discharge Summary  Patient ID: Bill Jackson MRN: [id number] DOB/AGE: 1935-07-17 78 y.o.  Admit date: 10/28/2012 Discharge date: 11/15/2012  Discharge Diagnoses:  Principal Problem:   Acute cerebellar hemorrhage Active Problems:   Type 2 diabetes mellitus   Hypertension   OA (osteoarthritis) of knee-right   Hypokalemia   Headache   Glaucoma   Low back pain   Discharged Condition: Good.   Significant Diagnostic Studies:  Dg Lumbar Spine 2-3 Views  10/31/2012  *RADIOLOGY REPORT*  Clinical Data: 78 year old male with posterior low back pain and stiffness.  LUMBAR SPINE - 2-3 VIEW  Comparison: 08/20/2009.  Findings: Normal lumbar segmentation.  Stable lumbar vertebral height and alignment.  Stable relatively preserved disc spaces. Extensive calcified atherosclerosis of the aorta and iliac arteries.  Stable sacral ala and SI joints.  IMPRESSION: 1. No acute osseous abnormality in the lumbar spine. 2.  Chronic calcified atherosclerosis.   Original Report Authenticated By: Erskine Speed, M.D.     Labs:  Basic Metabolic Panel:  Recent Labs Lab 11/10/12 0618 11/11/12 0605  NA 132* 135  K 4.1 4.2  CL 94* 96  CO2 29 28  GLUCOSE 219* 160*  BUN 16 21  CREATININE 1.05 1.33  CALCIUM 9.5 9.4    CBC:  Recent Labs Lab 11/09/12 0500  WBC 5.1  HGB 15.3  HCT 43.0  MCV 88.5  PLT 475*    CBG:  Recent Labs Lab 11/14/12 0711 11/14/12 1115 11/14/12 1701 11/14/12 2119 11/15/12 0711  GLUCAP 192* 248* 126* 105* 180*    HPI:   Bill Jackson is a 77 y.o. male with history of hypertension, diabetes mellitus, who was admitted on 10/25/12 with acute onset of nausea and vomiting, dizziness as well as ataxia. CT scan of the head showed a 5 x 3.5 cm acute cerebellar hemorrhage with mild mass effect on fourth ventricle.  He was evaluated by Dr. [name] and underwent emergent suboccipital craniectomy for evacuation of hematoma that pm. Neurology felt that patient with hemorrhage  due to accelerated HTN. Patient with resultant dysarthria, weak cough, dysphagia, significant dysmetria RUE and significant visual deficits. Diet was changed to D3, nectar liquids as mentation improved. Therapy evaluated patient and recommended CIR for progression.   Hospital Course: Bill Jackson was admitted to rehab 10/28/2012 for inpatient therapies to consist of PT, ST and OT at least three hours five days a week. Past admission physiatrist, therapy team and rehab RN have worked together to provide customized collaborative inpatient rehab. Blood pressures were monitored on bid basis and have been well controlled. Diabetes was monitored with AC/HS cbg checks.  Po intake has improved and  BS have started trending upwards. 70/30 insulin was resumed and was titrated to 10 units bid yesterday.  Recommend further titration to get blood sugars under tighter control. Headaches are improving and have been reasonably controlled with prn oxycodone. Renal status has been monitored due to nectar liquids. Nocturnal IVF discontinued as patient was cleared for thin liquids. Patient has made good functional gains during his stay but he continues to assistance for safety due to visual as well as balance issues. His friends are unable to provide 24 hours assisstance  and he has elected on continued progressive therapies at SNF.  He is discharged to [facility] on 11/16/11.   During patient's stay in rehab weekly team conferences were held to monitor patient's progress, set goals and discuss barriers to discharge. Speech therapy has worked with patient on dysphagia, cognition  and speech intelligibility. He is overall Min A for utilization of speech intelligibility strategies and is ~90% intelligible at the phrase level. Pt is tolerating Dys. 2 textures with thin liquids and requires Min A verbal cues for utilization of swallowing compensatory strategies of multiple swallows, alternating solids and liquids and an intermittent  throat clear. His overall cognitive function though inconsistent at times is at baseline but can be impacted by visual deficits. He is showing improved activity tolerance, improved balance, postural control and coordination. He requires min assist for ambualting with rolling walker. He continues to be limited by delayed balance reactions and dizziness with turns. He has been given vestibular habituation exercises to help decrease symptoms.   Patient at supervision level for basic ADL tasks and min A for functional ambulation and dynamic standing to complete these tasks.       Disposition: 62-Rehab Facility   Diet: D2, thin liquids. Carb modified restrictions.  Full supervision. Needs to alternate solids with liquids and take small bites/sips. Multiple swallows. Sit up 45 minutes past meals to prevent reflux. Administer meds in puree.   Special Instructions:  1.  Check blood sugars AC/HS and titrate 70/30 insulin for tighter control. 2.  Apply Lidocaine patch at 7am daily to lower back and remove 7 pm daily. 3. Voltaren gel to knees for OA. 4. Needs to be upright for meals and stay up 45 minutes past eating or drinking to prevent reflux.  5. Encourage vestibular exercises at least qid.         Future Appointments Provider Department Dept Phone   11/30/2012 11:00 AM Ranelle Oyster, MD [facility] [phone]       Medication List    STOP taking these medications       benazepril 40 MG tablet  Commonly known as:  LOTENSIN      TAKE these medications       acetaminophen 325 MG tablet  Commonly known as:  TYLENOL  Take 1-2 tablets (325-650 mg total) by mouth every 4 (four) hours as needed.     brimonidine 0.2 % ophthalmic solution  Commonly known as:  ALPHAGAN  Place 1 drop into both eyes every 8 (eight) hours.     diclofenac sodium 1 % Gel  Commonly known as:  VOLTAREN  Apply 4 g topically 3 (three) times daily.     dorzolamide-timolol  22.3-6.8 MG/ML ophthalmic solution  Commonly known as:  COSOPT  Place 1 drop into the right eye 2 (two) times daily.     feeding supplement Liqd  Take 237 mLs by mouth daily.     folic acid 1 MG tablet  Commonly known as:  FOLVITE  Take 1 tablet (1 mg total) by mouth daily.     glipiZIDE 10 MG tablet  Commonly known as:  GLUCOTROL  Take 1 tablet (10 mg total) by mouth 2 (two) times daily before a meal.     hydrochlorothiazide 25 MG tablet  Commonly known as:  HYDRODIURIL  Take 1 tablet (25 mg total) by mouth daily.     insulin aspart protamine-insulin aspart (70-30) 100 UNIT/ML injection  Commonly known as:  NOVOLOG 70/30  Inject 10 Units into the skin 2 (two) times daily with a meal.     latanoprost 0.005 % ophthalmic solution  Commonly known as:  XALATAN  Place 1 drop into both eyes at bedtime.     lidocaine 5 %  Commonly known as:  LIDODERM  Place 2  patches onto the skin daily. Remove & Discard patch within 12 hours or as directed by MD     losartan 100 MG tablet  Commonly known as:  COZAAR  Take 1 tablet (100 mg total) by mouth daily.     metFORMIN 1000 MG tablet  Commonly known as:  GLUCOPHAGE  Take 1 tablet (1,000 mg total) by mouth 2 (two) times daily with a meal.     multivitamin with minerals Tabs  Take 1 tablet by mouth daily.     oxyCODONE 5 MG immediate release tablet--Rx #10.  Commonly known as:  Oxy IR/ROXICODONE  Take 1-2 tablets (5-10 mg total) by mouth every 3 (three) hours as needed.     pantoprazole 40 MG tablet  Commonly known as:  PROTONIX  Take 1 tablet (40 mg total) by mouth daily.     potassium chloride 10 MEQ tablet  Commonly known as:  K-DUR,KLOR-CON  Take 1 tablet (10 mEq total) by mouth daily.     senna-docusate 8.6-50 MG per tablet  Commonly known as:  Senokot-S  Take 1 tablet by mouth 2 (two) times daily.     thiamine 100 MG tablet  Take 1 tablet (100 mg total) by mouth daily.     traMADol 50 MG tablet  Commonly known as:   ULTRAM  Take 1 tablet (50 mg total) by mouth every 12 (twelve) hours as needed.     traZODone 50 MG tablet  Commonly known as:  DESYREL  Take 1 tablet (50 mg total) by mouth at bedtime as needed for sleep.       Follow-up Information   Follow up with Ranelle Oyster, MD On 11/30/2012. (Be there at10:30 for 11 am appointment. )    Contact information:   [address], [address] [place] [address] [phone]       Follow up with Dorian Heckle, MD. Call today. (for follow up in 2 weeks. )    Contact information:   [address] [place] [address] [phone]       Signed: Jacquelynn Cree 11/15/2012, 9:08 AM

## 2012-11-14 NOTE — Progress Notes (Signed)
Social Work Patient ID: Bill Jackson, male   DOB: 26-Mar-1935, 78 y.o.   MRN: [id number]  Have received SNF bed offer for pt from [facility] - pt aware and agreeable with plans to transfer tomorrow.  Await word from local supports about whether they might be able to assist with transportation vs. Ambulance tf.  Have alerted insurance CM as well.  Team aware.    Iris Tatsch, LCSW

## 2012-11-14 NOTE — Progress Notes (Signed)
Patient vomited large amount undigested food after eating pm meal. Patient refused antiemetic . Head of bed elevated 30 degrees. Patient resting at this time .                               Bill Jackson

## 2012-11-15 ENCOUNTER — Ambulatory Visit: Payer: BC Managed Care – PPO | Admitting: Physical Therapy

## 2012-11-15 ENCOUNTER — Ambulatory Visit: Payer: Medicare Other | Admitting: Occupational Therapy

## 2012-11-15 DIAGNOSIS — M76899 Other specified enthesopathies of unspecified lower limb, excluding foot: Secondary | ICD-10-CM

## 2012-11-15 DIAGNOSIS — I619 Nontraumatic intracerebral hemorrhage, unspecified: Secondary | ICD-10-CM

## 2012-11-15 DIAGNOSIS — I10 Essential (primary) hypertension: Secondary | ICD-10-CM

## 2012-11-15 DIAGNOSIS — M545 Low back pain: Secondary | ICD-10-CM | POA: Diagnosis present

## 2012-11-15 LAB — GLUCOSE, CAPILLARY

## 2012-11-15 MED ORDER — ADULT MULTIVITAMIN W/MINERALS CH: 1.0000 | ORAL_TABLET | Freq: Every day | ORAL | Status: DC

## 2012-11-15 MED ORDER — DORZOLAMIDE HCL-TIMOLOL MAL 2-0.5 % OP SOLN: 1.0000 [drp] | Freq: Two times a day (BID) | OPHTHALMIC | Status: DC

## 2012-11-15 MED ORDER — LATANOPROST 0.005 % OP SOLN: 1.0000 [drp] | Freq: Every day | OPHTHALMIC | Status: DC

## 2012-11-15 MED ORDER — GLUCERNA SHAKE PO LIQD: 237.0000 mL | Freq: Every day | ORAL | Status: DC

## 2012-11-15 MED ORDER — INSULIN ASPART PROT & ASPART (70-30 MIX) 100 UNIT/ML ~~LOC~~ SUSP: 10.0000 [IU] | Freq: Two times a day (BID) | SUBCUTANEOUS | Status: DC

## 2012-11-15 MED ORDER — TRAMADOL HCL 50 MG PO TABS: 50.0000 mg | ORAL_TABLET | Freq: Two times a day (BID) | ORAL | Status: DC | PRN

## 2012-11-15 MED ORDER — PANTOPRAZOLE SODIUM 40 MG PO TBEC
40.0000 mg | DELAYED_RELEASE_TABLET | Freq: Every day | ORAL | Status: DC
Start: 2012-11-15 — End: 2013-01-19

## 2012-11-15 MED ORDER — LIDOCAINE 5 % EX PTCH: 2.0000 | MEDICATED_PATCH | CUTANEOUS | Status: DC

## 2012-11-15 MED ORDER — POTASSIUM CHLORIDE CRYS ER 10 MEQ PO TBCR: 10.0000 meq | EXTENDED_RELEASE_TABLET | Freq: Every day | ORAL | Status: DC

## 2012-11-15 MED ORDER — HYDROCHLOROTHIAZIDE 25 MG PO TABS: 25.0000 mg | ORAL_TABLET | Freq: Every day | ORAL | Status: DC

## 2012-11-15 MED ORDER — SENNOSIDES-DOCUSATE SODIUM 8.6-50 MG PO TABS: 1.0000 | ORAL_TABLET | Freq: Two times a day (BID) | ORAL | Status: DC

## 2012-11-15 MED ORDER — OXYCODONE HCL 5 MG PO TABS: 5.0000 mg | ORAL_TABLET | ORAL | Status: DC | PRN

## 2012-11-15 MED ORDER — THIAMINE HCL 100 MG PO TABS: 100.0000 mg | ORAL_TABLET | Freq: Every day | ORAL | Status: DC

## 2012-11-15 MED ORDER — BRIMONIDINE TARTRATE 0.2 % OP SOLN: 1.0000 [drp] | Freq: Three times a day (TID) | OPHTHALMIC | Status: DC

## 2012-11-15 MED ORDER — METFORMIN HCL 1000 MG PO TABS: 1000.0000 mg | ORAL_TABLET | Freq: Two times a day (BID) | ORAL | Status: DC

## 2012-11-15 MED ORDER — DICLOFENAC SODIUM 1 % TD GEL: 4.0000 g | Freq: Three times a day (TID) | TRANSDERMAL | Status: DC

## 2012-11-15 MED ORDER — GLIPIZIDE 10 MG PO TABS: 10.0000 mg | ORAL_TABLET | Freq: Two times a day (BID) | ORAL | Status: DC

## 2012-11-15 MED ORDER — ACETAMINOPHEN 325 MG PO TABS: 325.0000 mg | ORAL_TABLET | ORAL | Status: DC | PRN

## 2012-11-15 MED ORDER — TRAZODONE HCL 50 MG PO TABS: 50.0000 mg | ORAL_TABLET | Freq: Every evening | ORAL | Status: DC | PRN

## 2012-11-15 MED ORDER — FOLIC ACID 1 MG PO TABS: 1.0000 mg | ORAL_TABLET | Freq: Every day | ORAL | Status: DC

## 2012-11-15 MED ORDER — LOSARTAN POTASSIUM 100 MG PO TABS: 100.0000 mg | ORAL_TABLET | Freq: Every day | ORAL | Status: DC

## 2012-11-15 NOTE — Progress Notes (Signed)
Physical Therapy Discharge Summary  [name] MRN: [id number] Date of Birth: Jun 22, 1935  Today's Date: 11/15/2012  Patient has met 7 of 8 long term goals due to improved activity tolerance, improved balance, improved postural control, ability to compensate for deficits, improved awareness and improved coordination.  Patient to discharge at an ambulatory level Min Assist with RW.  Pt requires assistance due to c/o dizziness and LOB when turning which pt is unable to self correct.  Pt underwent vestibular evaluation and has been given exercises to decrease dizziness to improve balance when turning.  Reasons goals not met: pt continues to require min A for dynamic standing balance due to delayed balance reactions and dizziness when turning.  Recommendation:  Patient will benefit from ongoing skilled PT services in skilled nursing facility setting to continue to advance safe functional mobility, address ongoing impairments in balance, gait, and minimize fall risk.  Equipment: No equipment provided  Reasons for discharge: treatment goals met and discharge from hospital  Patient/family agrees with progress made and goals achieved: Yes  PT Discharge  Cognition Overall Cognitive Status: Appears within functional limits for tasks assessed Sensation Sensation Light Touch: Appears Intact Proprioception: Impaired Detail Proprioception Impaired Details: Impaired RLE;Impaired LLE Coordination Gross Motor Movements are Fluid and Coordinated: No Coordination and Movement Description: ataxia in trunk and limbs Motor  Motor Motor: Ataxia Motor - Discharge Observations: improved from eval but continues with ataxia   Trunk/Postural Assessment  Cervical Assessment Cervical Assessment: Within Functional Limits Thoracic Assessment Thoracic Assessment: Within Functional Limits Lumbar Assessment Lumbar Assessment: Within Functional Limits Postural Control Righting  Reactions: delayed Postural Limitations: decreased back pain  Balance Static Sitting Balance Static Sitting - Level of Assistance: 6: Modified independent (Device/Increase time) Dynamic Sitting Balance Dynamic Sitting - Level of Assistance: 6: Modified independent (Device/Increase time) Static Standing Balance Static Standing - Balance Support: During functional activity Static Standing - Level of Assistance: 5: Stand by assistance Dynamic Standing Balance Dynamic Standing - Balance Support: During functional activity Dynamic Standing - Level of Assistance: 4: Min assist Extremity Assessment      RLE Assessment RLE Assessment: Within Functional Limits LLE Assessment LLE Assessment: Within Functional Limits  See FIM for current functional status  Jocsan Mcginley 11/15/2012, 8:02 AM

## 2012-11-15 NOTE — Progress Notes (Signed)
Patient ID: Bill Jackson, male   DOB: 02-09-1935, 78 y.o.   MRN: [id number]  Subjective/Complaints: Visual changes with movement Occ nausea related to above R shoulder pain after therapy A 12 point review of systems has been performed and if not noted above is otherwise negative.   Objective: Vital Signs: Blood pressure 130/75, pulse 62, temperature 98 F (36.7 C), temperature source Oral, resp. rate 19, height 5\' 7"  (1.702 m), weight 93.1 kg (205 lb 4 oz), SpO2 100.00%. No results found. No results found for this basename: WBC, HGB, HCT, PLT,  in the last 72 hours No results found for this basename: NA, K, CL, CO, GLUCOSE, BUN, CREATININE, CALCIUM,  in the last 72 hours CBG (last 3)   Recent Labs  11/14/12 1701 11/14/12 2119 11/15/12 0711  GLUCAP 126* 105* 180*    Wt Readings from Last 3 Encounters:  11/02/12 93.1 kg (205 lb 4 oz)  10/25/12 92.1 kg (203 lb 0.7 oz)  10/25/12 92.1 kg (203 lb 0.7 oz)    Physical Exam:  Constitutional: He is oriented to person, place, and time. He appears well-developed and well-nourished.   Marland Kitchen HENT:  Head: Normocephalic and atraumatic.  Right Ear: External ear normal.  Left Ear: External ear normal.  Eyes: Conjunctivae and EOM are normal. Pupils are equal, round, and reactive to light. Right eye exhibits no discharge. Left eye exhibits no discharge. No scleral icterus.  Dysconjugate gaze. Arcus senilis. Pinpoint pupils.  Neck: Neck supple. No JVD present. No thyromegaly present.  Limited due to posterior neck incision.  Cardiovascular: Normal rate and regular rhythm. Exam reveals no gallop and no friction rub.  No murmur heard.  Pulmonary/Chest: Effort normal and breath sounds normal. No respiratory distress. He has no wheezes. He has no rales. He exhibits no tenderness.  Abdominal: Soft. Bowel sounds are normal. He exhibits no distension. There is no tenderness. There is no rebound.  Lymphadenopathy:  He has no cervical adenopathy.   Neurological: He is alert slightly confused.  Moderate to severe dysarthria persists.     Occasional perseverative.   Visual deficits in L>R peripheral fields, dysconjugate gaze. Mild right sided weakness with ataxia RUE and decrease in motor control RLE with ataxia as well.  . Strength remains grossly  4/5 in all 4 limbs.  Skin: Skin is warm and dry.  Musculoskeletal: R shoulder impingement  Assessment/Plan: 1. Functional deficits secondary to midline to right paracentral cerebellar hemorrhage s/p suboccipital craniotomy stable for transfer to SNF todayFIM: FIM - Bathing Bathing Steps Patient Completed: Chest;Right Arm;Left Arm;Abdomen;Front perineal area;Buttocks;Right upper leg;Left upper leg;Right lower leg (including foot);Left lower leg (including foot) Bathing: 5: Set-up assist to: Adjust water temp  FIM - Upper Body Dressing/Undressing Upper body dressing/undressing steps patient completed: Thread/unthread right sleeve of pullover shirt/dresss;Thread/unthread left sleeve of pullover shirt/dress;Put head through opening of pull over shirt/dress;Pull shirt over trunk Upper body dressing/undressing: 5: Set-up assist to: Obtain clothing/put away FIM - Lower Body Dressing/Undressing Lower body dressing/undressing steps patient completed: Thread/unthread right pants leg;Pull pants up/down;Fasten/unfasten pants;Don/Doff right sock;Don/Doff left sock;Thread/unthread left pants leg;Don/Doff right shoe;Don/Doff left shoe;Fasten/unfasten right shoe;Fasten/unfasten left shoe;Thread/unthread right underwear leg;Thread/unthread left underwear leg;Pull underwear up/down Lower body dressing/undressing: 5: Supervision: Safety issues/verbal cues  FIM - Toileting Toileting steps completed by patient: Adjust clothing prior to toileting;Performs perineal hygiene;Adjust clothing after toileting Toileting Assistive Devices: Grab bar or rail for support Toileting: 5: Supervision: Safety issues/verbal  cues  FIM - Diplomatic Services operational officer Devices: Best boy  Transfers: 4-To toilet/BSC: Min A (steadying Pt. > 75%)  FIM - Bed/Chair Transfer Bed/Chair Transfer Assistive Devices: Therapist, occupational: 5: Supine > Sit: Supervision (verbal cues/safety issues);4: Bed > Chair or W/C: Min A (steadying Pt. > 75%);4: Chair or W/C > Bed: Min A (steadying Pt. > 75%)  FIM - Locomotion: Wheelchair Distance: 150 Locomotion: Wheelchair: 5: Travels 150 ft or more: maneuvers on rugs and over door sills with supervision, cueing or coaxing FIM - Locomotion: Ambulation Locomotion: Ambulation Assistive Devices: Designer, industrial/product Ambulation/Gait Assistance: 4: Min assist Locomotion: Ambulation: 2: Travels 50 - 149 ft with minimal assistance (Pt.>75%)  Comprehension Comprehension Mode: Auditory Comprehension: 5-Understands basic 90% of the time/requires cueing < 10% of the time  Expression Expression Mode: Verbal Expression Assistive Devices: 6-Communication board Expression: 5-Expresses complex 90% of the time/cues < 10% of the time  Social Interaction Social Interaction: 4-Interacts appropriately 75 - 89% of the time - Needs redirection for appropriate language or to initiate interaction.  Problem Solving Problem Solving: 5-Solves complex 90% of the time/cues < 10% of the time  Memory Memory Mode: Not assessed Memory: 5-Recognizes or recalls 90% of the time/requires cueing < 10% of the time  Medical Problem List and Plan:  1. DVT Prophylaxis/Anticoagulation: Mechanical: Sequential compression devices, below knee Bilateral lower extremities  2. Headaches/pain:  Overall much improved  - trochanteric bursitis bilaterally improved  -lumbar xrays unremarkable  -ice/voltaren gel. Continue to work on ROM, avoid sleeping on sides 3. Mood: Seems to be in good spirits.  -in good spirits.  4. Neuropsych: This patient is capable of making decisions on his/her own  behalf.  5. HTN: Continue hctz,cozaar. Monitoring renal status   6. DM type 2: Was on 70/30 insulin additionally at home-Hgb A1C @ 6.7. Monitor BS with AC/HS cbg checks.Improving Continue metformin and glipizide--change back to 10mg  bid and reintroduce insulin 5u bid  -dietary ed 7. H/o Alcohol use:  dc'ed thiamine 8. FEN:  Potassium replacement--level normal.   -slightly hyponatremic now---recheck in am.  Consider stopping hctz  9. Dysphagia: Needs supervision for meals. Encourage fluids  -diet upgraded 10. Confusion: resolved--at baseline  -ua negative, culture negative      LOS (Days) 18 A FACE TO FACE EVALUATION WAS PERFORMED  Claudette Laws E 11/15/2012 7:20 AM

## 2012-11-15 NOTE — Progress Notes (Signed)
Social Work  Discharge Note  The overall goal for the admission was met for:   Discharge location: No - plan changed to SNF  Length of Stay: No - extended approx 3 days due to SNF bed search  Discharge activity level: Yes  Home/community participation: Yes  Services provided included: MD, RD, PT, OT, SLP, RN, TR, Pharmacy, Neuropsych and SW  Financial Services: Private Insurance: [facility] Medicare  Follow-up services arranged: Other: SNF at [facility]  Comments (or additional information):  Patient/Family verbalized understanding of follow-up arrangements: Yes  Individual responsible for coordination of the follow-up plan: patient  Confirmed correct DME delivered: NA  Keliyah Spillman, LCSW

## 2012-11-16 ENCOUNTER — Non-Acute Institutional Stay (SKILLED_NURSING_FACILITY): Payer: Medicare Other | Admitting: Nurse Practitioner

## 2012-11-16 ENCOUNTER — Other Ambulatory Visit: Payer: Self-pay | Admitting: Geriatric Medicine

## 2012-11-16 DIAGNOSIS — M25519 Pain in unspecified shoulder: Secondary | ICD-10-CM

## 2012-11-16 DIAGNOSIS — M25511 Pain in right shoulder: Secondary | ICD-10-CM

## 2012-11-16 MED ORDER — OXYCODONE HCL 5 MG PO TABS: 5.0000 mg | ORAL_TABLET | ORAL | Status: DC | PRN

## 2012-11-16 NOTE — Progress Notes (Addendum)
Pt c/o new onset right shoulder pain..10/10.  Oxy 10mg  effective.  Dr. Wynn Banker aware.  K-pad ordered, pt. declined.

## 2012-11-17 ENCOUNTER — Non-Acute Institutional Stay (SKILLED_NURSING_FACILITY): Payer: Medicare Other | Admitting: Internal Medicine

## 2012-11-17 DIAGNOSIS — H409 Unspecified glaucoma: Secondary | ICD-10-CM

## 2012-11-17 DIAGNOSIS — R531 Weakness: Secondary | ICD-10-CM

## 2012-11-17 DIAGNOSIS — M25511 Pain in right shoulder: Secondary | ICD-10-CM | POA: Insufficient documentation

## 2012-11-17 DIAGNOSIS — E119 Type 2 diabetes mellitus without complications: Secondary | ICD-10-CM

## 2012-11-17 DIAGNOSIS — M25519 Pain in unspecified shoulder: Secondary | ICD-10-CM

## 2012-11-17 DIAGNOSIS — M6281 Muscle weakness (generalized): Secondary | ICD-10-CM

## 2012-11-17 DIAGNOSIS — I614 Nontraumatic intracerebral hemorrhage in cerebellum: Secondary | ICD-10-CM

## 2012-11-17 DIAGNOSIS — I619 Nontraumatic intracerebral hemorrhage, unspecified: Secondary | ICD-10-CM

## 2012-11-17 DIAGNOSIS — I10 Essential (primary) hypertension: Secondary | ICD-10-CM

## 2012-11-17 DIAGNOSIS — M1710 Unilateral primary osteoarthritis, unspecified knee: Secondary | ICD-10-CM

## 2012-11-17 DIAGNOSIS — M545 Low back pain: Secondary | ICD-10-CM

## 2012-11-17 NOTE — Progress Notes (Signed)
Patient ID: Bill Jackson, male   DOB: [birth year], 78 y.o.   MRN: [id number]  Code Status: full code  Allergies  Allergen Reactions  . Lotensin (Benazepril Hcl)     cough  . Penicillins Other (See Comments)    unknown    Chief Complaint: new admit post hospitalization 3/28/1- 11/15/12  HPI:  Bill Jackson is a 78 y.o. male with history of hypertension and diabetes mellitus and was admitted on 10/25/12 to hospital with acute onset of nausea and vomiting, dizziness and ataxia. CT scan of the head showed a 5 x 3.5 cm acute cerebellar hemorrhage with mild mass effect on fourth ventricle.  He was evaluated by Dr. [name] and underwent emergent suboccipital craniectomy for evacuation of hematoma that pm. Neurology felt the hemorrhage was due to uncontrolled hypertension. He had residual dysarthria, weak cough, dysphagia, significant dysmetria RUE and significant visual deficits thereafter. He was admitted to inpatient rehab on 10/28/12. He was also seen by SLP and put on dysphagia level 3 diet. His bp was kept under control and sugar and bp monitored. He has been sent to SNF for further STR and goal is for him to return home Patient was seen in his room today. He is in no distress, alert and oriented. As per staff his confusion has subsided and his speech has been clearing up. He has been complaining of pain in his right shoulder since yesterday. No witnessed fall in facility but mentions about a fall in hospital. No other complaints.  Review of Systems: Review of Systems  Constitutional: Negative for fever, chills and diaphoresis.  HENT: Negative for congestion.   Eyes: Negative for blurred vision and pain.  Respiratory: Negative for cough and shortness of breath.   Cardiovascular: Negative for chest pain, palpitations, orthopnea and leg swelling.  Gastrointestinal: Negative for nausea, vomiting, abdominal pain and constipation.  Genitourinary: Negative for dysuria.  Musculoskeletal: Negative for  myalgias.       Right shoulder pain  Skin: Negative for rash.  Neurological: Positive for weakness. Negative for dizziness, focal weakness and headaches.  Psychiatric/Behavioral: Negative for depression and memory loss.    Past Medical History  Diagnosis Date  . Hypertension   . Diabetes mellitus    Past Surgical History  Procedure Laterality Date  . Heart stents  x3  . Cardiac surgery    . Hernia repair    . Rotator cuff repair    . Craniectomy N/A 10/25/2012    Procedure: Suboccipital Craniectomy for Evacuation of Cerebellar Hematoma;  Surgeon: Maeola Harman, MD;  Location: [facility];  Service: Neurosurgery;  Laterality: N/A;  Suboccipital Craniectomy for Evacuation of Cerebellar Hematoma   Social History:   reports that he has been smoking.  He does not have any smokeless tobacco history on file. He reports that  drinks alcohol. He reports that he does not use illicit drugs.  No family history on file.  Medications: Patient's Medications  New Prescriptions   No medications on file  Previous Medications   ACETAMINOPHEN (TYLENOL) 325 MG TABLET    Take 1-2 tablets (325-650 mg total) by mouth every 4 (four) hours as needed.   BRIMONIDINE (ALPHAGAN) 0.2 % OPHTHALMIC SOLUTION    Place 1 drop into both eyes every 8 (eight) hours.   DICLOFENAC SODIUM (VOLTAREN) 1 % GEL    Apply 4 g topically 3 (three) times daily.   DORZOLAMIDE-TIMOLOL (COSOPT) 22.3-6.8 MG/ML OPHTHALMIC SOLUTION    Place 1 drop into the right eye 2 (  two) times daily.   FEEDING SUPPLEMENT (GLUCERNA SHAKE) LIQD    Take 237 mLs by mouth daily.   FOLIC ACID (FOLVITE) 1 MG TABLET    Take 1 tablet (1 mg total) by mouth daily.   GLIPIZIDE (GLUCOTROL) 10 MG TABLET    Take 1 tablet (10 mg total) by mouth 2 (two) times daily before a meal.   HYDROCHLOROTHIAZIDE (HYDRODIURIL) 25 MG TABLET    Take 1 tablet (25 mg total) by mouth daily.   INSULIN ASPART PROTAMINE-INSULIN ASPART (NOVOLOG 70/30) (70-30) 100 UNIT/ML INJECTION     Inject 10 Units into the skin 2 (two) times daily with a meal.   LATANOPROST (XALATAN) 0.005 % OPHTHALMIC SOLUTION    Place 1 drop into both eyes at bedtime.   LIDOCAINE (LIDODERM) 5 %    Place 2 patches onto the skin daily. Remove & Discard patch within 12 hours or as directed by MD   LOSARTAN (COZAAR) 100 MG TABLET    Take 1 tablet (100 mg total) by mouth daily.   METFORMIN (GLUCOPHAGE) 1000 MG TABLET    Take 1 tablet (1,000 mg total) by mouth 2 (two) times daily with a meal.   MULTIPLE VITAMIN (MULTIVITAMIN WITH MINERALS) TABS    Take 1 tablet by mouth daily.   OXYCODONE (OXY IR/ROXICODONE) 5 MG IMMEDIATE RELEASE TABLET    Take 1-2 tablets (5-10 mg total) by mouth every 3 (three) hours as needed.   PANTOPRAZOLE (PROTONIX) 40 MG TABLET    Take 1 tablet (40 mg total) by mouth daily.   POTASSIUM CHLORIDE (K-DUR,KLOR-CON) 10 MEQ TABLET    Take 1 tablet (10 mEq total) by mouth daily.   SENNA-DOCUSATE (SENOKOT-S) 8.6-50 MG PER TABLET    Take 1 tablet by mouth 2 (two) times daily.   THIAMINE 100 MG TABLET    Take 1 tablet (100 mg total) by mouth daily.   TRAMADOL (ULTRAM) 50 MG TABLET    Take 1 tablet (50 mg total) by mouth every 12 (twelve) hours as needed.   TRAZODONE (DESYREL) 50 MG TABLET    Take 1 tablet (50 mg total) by mouth at bedtime as needed for sleep.  Modified Medications   No medications on file  Discontinued Medications   No medications on file     Physical Exam: Filed Vitals:   11/17/12 1302  BP: 115/80  Pulse: 68  Temp: 97.6 F (36.4 C)  Resp: 16  SpO2: 96%   Constitutional: oriented to person, place, and time. He appears well-developed and well-nourished.  HENT:   Head: Normocephalic and atraumatic.  Right Ear: External ear normal.  Left Ear: External ear normal.   Eyes: Conjunctivae and EOM are normal. Pupils are equal, round, and reactive to light. Right eye exhibits no discharge. Left eye exhibits no discharge. No scleral icterus.  Dysconjugate gaze. Arcus  senilis.  Neck: Neck supple. No JVD present. No thyromegaly present.  Limited due to posterior neck incision.  Cardiovascular: Normal rate and regular rhythm. Exam reveals no gallop and no friction rub.   No murmur heard.   Pulmonary/Chest: Effort normal and breath sounds normal. No respiratory distress. He has no wheezes. He has no rales. He exhibits no tenderness.   Abdominal: Soft. Bowel sounds are normal. He exhibits no distension. There is no tenderness. There is no rebound.  Lymphadenopathy: He has no cervical adenopathy.  Neurological: He is alert and oriented to person and place and time today, some slurring of the speech.  dysarthria persists. Visual  deficits in L>R peripheral fields, dysconjugate gaze. Mild right sided weakness with ataxia RUE and decrease in motor control RLE with ataxia as well.  Strength remains grossly  4/5 in all 4 limbs. has tenderness on right shoulder joint and ROM limited with pain. Unable to abduct right arm beyond minimal degree with pain Skin: Skin is warm and dry.    Labs reviewed: Basic Metabolic Panel:  Recent Labs  45/40/98 0555 11/10/12 0618 11/11/12 0605  NA 136 132* 135  K 4.0 4.1 4.2  CL 97 94* 96  CO2 32 29 28  GLUCOSE 167* 219* 160*  BUN 14 16 21   CREATININE 1.15 1.05 1.33  CALCIUM 8.8 9.5 9.4   Liver Function Tests:  Recent Labs  10/31/12 0500  AST 24  ALT 27  ALKPHOS 51  BILITOT 0.5  PROT 6.3  ALBUMIN 2.6*   CBC:  Recent Labs  02/09/12 2100  10/27/12 0440 10/31/12 0500 11/09/12 0500  WBC 3.4*  < > 10.3 5.8 5.1  NEUTROABS 1.0*  --   --  3.3  --   HGB 14.1  < > 14.3 13.8 15.3  HCT 39.9  < > 41.5 39.1 43.0  MCV 94.5  < > 95.2 89.7 88.5  PLT 259  < > 196 311 475*  < > = values in this interval not displayed. Cardiac Enzymes:  Recent Labs  10/25/12 0345 10/25/12 0700  TROPONINI <0.30 <0.30   CBG:  Recent Labs  11/14/12 2119 11/15/12 0711 11/15/12 1113  GLUCAP 105* 180* 152*    Radiological  Exams: Significant Diagnostic Studies:  Dg Lumbar Spine 2-3 Views  10/31/2012  *RADIOLOGY REPORT*  Clinical Data: 78 year old male with posterior low back pain and stiffness.  LUMBAR SPINE - 2-3 VIEW  Comparison: 08/20/2009.  Findings: Normal lumbar segmentation.  Stable lumbar vertebral height and alignment.  Stable relatively preserved disc spaces. Extensive calcified atherosclerosis of the aorta and iliac arteries.  Stable sacral ala and SI joints.  IMPRESSION: 1. No acute osseous abnormality in the lumbar spine. 2.  Chronic calcified atherosclerosis.   Original Report Authenticated By: Erskine Speed, M.D.    Assessment/Plan Right sided weakness- s/p intracranial hemmorhage. Here for STR. Fall and aspiration precautions. To work with PT/OT for muscle group training and to work with SLP team for dysphagia. Prn pain regimen to be continued. Has regular bowel movement. Denies muscle spasm  Right shoulder pain- with limited rom and excruciating pain. Will get xray right shoulder to rule out fracture and if needed will get mri to assess for rotator cuff tear  DM type 2- monitor cbg, continue glucotrol and humalog 10 u bid and glucophage, check a1c   HTN- important to kee it controlled with recent bleed and s/p craniotomy. Continue hctz 25 mg daily and cozaar 100 mg daily and check bmp  Acute cerebral hemorrhage- s/p craniotomy and has residual right sided weakness and dysarthria, to work with therapy team, to keep bp under good control. Will check lipid panel and add statin if indicated.  Glaucoma- continue current eye drops, no changes  Alcohol abuse- counselling in detail needed prior to discharge on abstaining from etoh, continue folic acid and thiamine for now with MVI  Back Pain- will continue lidoderm patch and voltaren gel, continue oxycodone ir and tramadol prn  Insomnia- trazodone has been helpful, monitor symptoms  Labs/tests ordered- cbg monitor, a1c, cbc, cmp

## 2012-11-17 NOTE — Progress Notes (Signed)
  Subjective:    Patient ID: Bill Jackson, male    DOB: Nov 24, 1934, 78 y.o.   MRN: [id number]  HPI Comments: I was asked to see pt today for c/o of right shoulder pain. The patient says he fell and hit right shoulder on Monday 11/14/12 at the hospital before discharge. He was given pain med and pain was relieved and he was transferred here to [facility] for STR. Today while being assessed by therapy, he is unable to raise his right arm to shoulder height without pain. He says that he was able to raise his arm w/o issue before the fall.  Shoulder Pain  The pain is present in the right shoulder. This is a new problem. The current episode started in the past 7 days. There has been a history of trauma. The problem occurs constantly. The problem has been gradually worsening. Associated symptoms include a limited range of motion. The symptoms are aggravated by activity and contact. He has tried oral narcotics for the symptoms. The treatment provided no relief. His past medical history is significant for osteoarthritis.      Review of Systems  Constitutional: Positive for activity change.  HENT: Negative.   Eyes: Negative.   Respiratory: Negative.   Cardiovascular: Negative.   Gastrointestinal: Negative.   Endocrine: Negative.   Musculoskeletal: Positive for joint swelling.       Right arm shoulder pain, unable to lift arm.  Skin: Negative.   Allergic/Immunologic: Negative.   Neurological: Negative.   Hematological: Negative.   Psychiatric/Behavioral: Negative.        Objective:   Physical Exam  Constitutional: He is oriented to person, place, and time. He appears well-developed and well-nourished.  Eyes: Pupils are equal, round, and reactive to light.  Cardiovascular: Normal rate and regular rhythm.   Pulmonary/Chest: Effort normal and breath sounds normal.  Musculoskeletal: He exhibits tenderness.       Right shoulder: He exhibits decreased range of motion, tenderness, pain and decreased  strength.  Neurological: He is alert and oriented to person, place, and time.          Assessment & Plan:  1) will obtain xray of right shoulder/arm to assess for fracture. Continue prn meds for pain control.

## 2012-11-22 ENCOUNTER — Emergency Department (HOSPITAL_COMMUNITY)
Admission: EM | Admit: 2012-11-22 | Discharge: 2012-11-22 | Disposition: A | Discharge status: Skilled Nursing Facility | Payer: Medicare Other | Attending: Emergency Medicine | Admitting: Emergency Medicine

## 2012-11-22 ENCOUNTER — Emergency Department (HOSPITAL_COMMUNITY): Payer: Medicare Other

## 2012-11-22 ENCOUNTER — Encounter: Payer: Self-pay | Admitting: Nurse Practitioner

## 2012-11-22 ENCOUNTER — Encounter (HOSPITAL_COMMUNITY): Payer: Self-pay | Admitting: Physical Medicine and Rehabilitation

## 2012-11-22 DIAGNOSIS — M25521 Pain in right elbow: Secondary | ICD-10-CM

## 2012-11-22 DIAGNOSIS — Z794 Long term (current) use of insulin: Secondary | ICD-10-CM | POA: Insufficient documentation

## 2012-11-22 DIAGNOSIS — W1809XA Striking against other object with subsequent fall, initial encounter: Secondary | ICD-10-CM | POA: Insufficient documentation

## 2012-11-22 DIAGNOSIS — R110 Nausea: Secondary | ICD-10-CM | POA: Insufficient documentation

## 2012-11-22 DIAGNOSIS — Y9210 Unspecified residential institution as the place of occurrence of the external cause: Secondary | ICD-10-CM | POA: Insufficient documentation

## 2012-11-22 DIAGNOSIS — S4980XA Other specified injuries of shoulder and upper arm, unspecified arm, initial encounter: Secondary | ICD-10-CM | POA: Insufficient documentation

## 2012-11-22 DIAGNOSIS — S0990XA Unspecified injury of head, initial encounter: Secondary | ICD-10-CM | POA: Insufficient documentation

## 2012-11-22 DIAGNOSIS — E119 Type 2 diabetes mellitus without complications: Secondary | ICD-10-CM | POA: Insufficient documentation

## 2012-11-22 DIAGNOSIS — S59909A Unspecified injury of unspecified elbow, initial encounter: Secondary | ICD-10-CM | POA: Insufficient documentation

## 2012-11-22 DIAGNOSIS — M25511 Pain in right shoulder: Secondary | ICD-10-CM

## 2012-11-22 DIAGNOSIS — S46909A Unspecified injury of unspecified muscle, fascia and tendon at shoulder and upper arm level, unspecified arm, initial encounter: Secondary | ICD-10-CM | POA: Insufficient documentation

## 2012-11-22 DIAGNOSIS — Z79899 Other long term (current) drug therapy: Secondary | ICD-10-CM | POA: Insufficient documentation

## 2012-11-22 DIAGNOSIS — S6990XA Unspecified injury of unspecified wrist, hand and finger(s), initial encounter: Secondary | ICD-10-CM | POA: Insufficient documentation

## 2012-11-22 DIAGNOSIS — F17200 Nicotine dependence, unspecified, uncomplicated: Secondary | ICD-10-CM | POA: Insufficient documentation

## 2012-11-22 DIAGNOSIS — Y939 Activity, unspecified: Secondary | ICD-10-CM | POA: Insufficient documentation

## 2012-11-22 DIAGNOSIS — R42 Dizziness and giddiness: Secondary | ICD-10-CM | POA: Insufficient documentation

## 2012-11-22 DIAGNOSIS — I10 Essential (primary) hypertension: Secondary | ICD-10-CM | POA: Insufficient documentation

## 2012-11-22 LAB — CBC WITH DIFFERENTIAL/PLATELET
Basophils Absolute: 0 10*3/uL (ref 0.0–0.1)
Basophils Relative: 0 % (ref 0–1)
Hemoglobin: 17.3 g/dL — ABNORMAL HIGH (ref 13.0–17.0)
Lymphocytes Relative: 43 % (ref 12–46)
MCHC: 36.7 g/dL — ABNORMAL HIGH (ref 30.0–36.0)
Neutro Abs: 2.1 10*3/uL (ref 1.7–7.7)
Neutrophils Relative %: 44 % (ref 43–77)
RDW: 13.3 % (ref 11.5–15.5)
WBC: 4.8 10*3/uL (ref 4.0–10.5)

## 2012-11-22 LAB — BASIC METABOLIC PANEL
Chloride: 95 mEq/L — ABNORMAL LOW (ref 96–112)
GFR calc Af Amer: 51 mL/min — ABNORMAL LOW (ref >90)
Potassium: 3.9 mEq/L (ref 3.5–5.1)
Sodium: 135 mEq/L (ref 135–145)

## 2012-11-22 LAB — POCT I-STAT TROPONIN I: Troponin i, poc: 0 ng/mL (ref 0.00–0.08)

## 2012-11-22 MED ORDER — SODIUM CHLORIDE 0.9 % IV BOLUS (SEPSIS)
1000.0000 mL | Freq: Once | INTRAVENOUS | Status: DC
  Administered 2012-11-22: 1000 mL via INTRAVENOUS

## 2012-11-22 NOTE — ED Provider Notes (Signed)
History     CSN: [id number]  Arrival date & time 11/22/12  0347   First MD Initiated Contact with Patient 11/22/12 1005      Chief Complaint  Patient presents with  . Fall    (Consider location/radiation/quality/duration/timing/severity/associated sxs/prior treatment) HPI Comments: Patient is a 78 year old male with hx of HTN, and DM who presents from [facility] after having a fall this morning. Patient states that he was in the bathroom when he felt dizzy and he fell backwards hitting his head on the wall on the way to the floor. Patient admits to associated nausea, R shoulder pain, R elbow pain and throbbing headache in his R posterior parietal region. He denies loss of consciousness, chest pain, difficulty breathing, vision changes, vomiting, and numbness or tingling in his extremities. Patient speaks in full sentences with dysarthria, answering questions appropriately. 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  Patient is a 78 y.o. male presenting with fall. The history is provided by the patient. No language interpreter was used.  Fall Associated symptoms include nausea and headaches. Pertinent negatives include no numbness and no vomiting.    Past Medical History  Diagnosis Date  . Hypertension   . Diabetes mellitus     Past Surgical History  Procedure Laterality Date  . Heart stents  x3  . Cardiac surgery    . Hernia repair    . Rotator cuff repair    . Craniectomy N/A 10/25/2012    Procedure: Suboccipital Craniectomy for Evacuation of Cerebellar Hematoma;  Surgeon: Maeola Harman, MD;  Location: [facility];  Service: Neurosurgery;  Laterality: N/A;  Suboccipital Craniectomy for Evacuation of Cerebellar Hematoma    History reviewed. No pertinent family history.  History  Substance Use Topics  . Smoking status: Current Every Day Smoker  . Smokeless tobacco: Not  on file  . Alcohol Use: Yes     Review of Systems  HENT: Negative for neck pain and neck stiffness.   Eyes: Negative for visual disturbance.  Respiratory: Negative for shortness of breath.   Cardiovascular: Negative for chest pain.  Gastrointestinal: Positive for nausea. Negative for vomiting and diarrhea.  Musculoskeletal: Positive for arthralgias. Negative for back pain.  Neurological: Positive for dizziness and headaches. Negative for syncope, weakness and numbness.  All other systems reviewed and are negative.    Allergies  Lotensin and Penicillins  Home Medications   Current Outpatient Rx  Name  Route  Sig  Dispense  Refill  . acetaminophen (TYLENOL) 325 MG tablet   Oral   Take 1-2 tablets (325-650 mg total) by mouth every 4 (four) hours as needed.         . brimonidine (ALPHAGAN) 0.2 % ophthalmic solution   Both Eyes   Place 1 drop into both eyes every 8 (eight) hours.   5 mL      . diclofenac sodium (VOLTAREN) 1 % GEL   Topical   Apply 4 g topically 3 (three) times daily.         . dorzolamide-timolol (COSOPT) 22.3-6.8 MG/ML ophthalmic solution   Right Eye   Place 1 drop into the right eye 2 (two) times daily.   10 mL      . feeding supplement (GLUCERNA SHAKE) LIQD   Oral   Take 237 mLs by mouth daily.         . folic acid (FOLVITE) 1 MG tablet  Oral   Take 1 tablet (1 mg total) by mouth daily.         Marland Kitchen glipiZIDE (GLUCOTROL) 10 MG tablet   Oral   Take 1 tablet (10 mg total) by mouth 2 (two) times daily before a meal.         . hydrochlorothiazide (HYDRODIURIL) 25 MG tablet   Oral   Take 1 tablet (25 mg total) by mouth daily.         . insulin aspart protamine-insulin aspart (NOVOLOG 70/30) (70-30) 100 UNIT/ML injection   Subcutaneous   Inject 10 Units into the skin 2 (two) times daily with a meal.   10 mL      . latanoprost (XALATAN) 0.005 % ophthalmic solution   Both Eyes   Place 1 drop into both eyes at bedtime.   2.5 mL       . lidocaine (LIDODERM) 5 %   Transdermal   Place 2 patches onto the skin daily. Remove & Discard patch within 12 hours or as directed by MD   30 patch      . losartan (COZAAR) 100 MG tablet   Oral   Take 1 tablet (100 mg total) by mouth daily.         . metFORMIN (GLUCOPHAGE) 1000 MG tablet   Oral   Take 1 tablet (1,000 mg total) by mouth 2 (two) times daily with a meal.         . Multiple Vitamin (MULTIVITAMIN WITH MINERALS) TABS   Oral   Take 1 tablet by mouth daily.         Marland Kitchen oxyCODONE (OXY IR/ROXICODONE) 5 MG immediate release tablet   Oral   Take 1-2 tablets (5-10 mg total) by mouth every 3 (three) hours as needed.   240 tablet   0   . pantoprazole (PROTONIX) 40 MG tablet   Oral   Take 1 tablet (40 mg total) by mouth daily.         . potassium chloride (K-DUR,KLOR-CON) 10 MEQ tablet   Oral   Take 1 tablet (10 mEq total) by mouth daily.         Marland Kitchen senna-docusate (SENOKOT-S) 8.6-50 MG per tablet   Oral   Take 1 tablet by mouth 2 (two) times daily.         Marland Kitchen thiamine 100 MG tablet   Oral   Take 1 tablet (100 mg total) by mouth daily.         . traMADol (ULTRAM) 50 MG tablet   Oral   Take 1 tablet (50 mg total) by mouth every 12 (twelve) hours as needed.   30 tablet      . traZODone (DESYREL) 50 MG tablet   Oral   Take 1 tablet (50 mg total) by mouth at bedtime as needed for sleep.           BP 119/45  Pulse 69  Temp(Src) 98.2 F (36.8 C) (Oral)  Resp 16  SpO2 98%  Physical Exam  Nursing note and vitals reviewed. Constitutional: He appears well-developed and well-nourished. No distress.  HENT:  Head: Normocephalic and atraumatic.  Mouth/Throat: Oropharynx is clear and moist. No oropharyngeal exudate.  Eyes: Conjunctivae are normal. No scleral icterus.  Anisocoria with teardrop shaped pupil in R eye  Neck: Normal range of motion. Neck supple.  No tenderness to palpation of the cervical midline.  Cardiovascular: Normal rate and  intact distal pulses.   Irregular rhythm  Pulmonary/Chest: Effort normal and  breath sounds normal. No respiratory distress. He has no wheezes. He has no rales.  Abdominal: Soft. He exhibits no distension. There is no tenderness. There is no rebound.  Musculoskeletal:       Right shoulder: He exhibits tenderness and bony tenderness. He exhibits normal range of motion, no swelling, no crepitus, no deformity, no laceration and normal pulse.       Right elbow: He exhibits normal range of motion, no swelling, no effusion, no deformity and no laceration. Tenderness found. Radial head, medial epicondyle and lateral epicondyle tenderness noted.       Right wrist: Normal.       Cervical back: Normal.  Lymphadenopathy:    He has no cervical adenopathy.  Neurological:  Cranial nerves II through XII grossly intact. There is symmetric right to the uvula with phonation. Patient exhibited equal grip strength eye laterally with 5 out of 5 strength against resistance in his upper and lower extremities. DTRs normal and symmetric. No sensory deficits appreciated; patient dysarthric, but at baseline  Skin: Skin is warm and dry. No rash noted. He is not diaphoretic. No erythema.  Psychiatric: He has a normal mood and affect. His behavior is normal.    ED Course  Procedures (including critical care time)  Labs Reviewed  CBC WITH DIFFERENTIAL - Abnormal; Notable for the following:    Hemoglobin 17.3 (*)    MCHC 36.7 (*)    All other components within normal limits  BASIC METABOLIC PANEL - Abnormal; Notable for the following:    Chloride 95 (*)    Glucose, Bld 156 (*)    BUN 27 (*)    Creatinine, Ser 1.48 (*)    GFR calc non Af Amer 44 (*)    GFR calc Af Amer 51 (*)    All other components within normal limits  POCT I-STAT TROPONIN I   Dg Shoulder Right  11/22/2012  *RADIOLOGY REPORT*  Clinical Data: right shoulder pain  RIGHT SHOULDER - 2+ VIEW  Comparison: None.  Findings: No fracture or dislocation.   [place]-Sachs deformity. Distal clavicle and adjacent ribs appear intact.  No osseous destructive lesions.  IMPRESSION: No acute findings.   Original Report Authenticated By: Davonna Belling, M.D.    Dg Elbow Complete Right  11/22/2012  *RADIOLOGY REPORT*  Clinical Data: Elbow pain.  Fall.  RIGHT ELBOW - COMPLETE 3+ VIEW  Comparison: None.  Findings: There is no evidence of fracture or dislocation.  There is no evidence of arthropathy or other focal bony abnormality. Soft tissues are unremarkable.  IMPRESSION: No evidence for elbow fracture.   Original Report Authenticated By: Davonna Belling, M.D.    Ct Head Wo Contrast  11/22/2012  *RADIOLOGY REPORT*  Clinical Data:  Fall, altered mental status.  CT HEAD WITHOUT CONTRAST CT CERVICAL SPINE WITHOUT CONTRAST  Technique:  Multidetector CT imaging of the head and cervical spine was performed following the standard protocol without intravenous contrast.  Multiplanar CT image reconstructions of the cervical spine were also generated.  Comparison:  Head CT 10/26/2012 at  CT HEAD  Findings: Craniotomy defect in the midline occipital bone again noted. Encephalomalacia within the cerebellum.  There is no acute intracranial hemorrhage.  No CT evidence of acute infarction.  No hydrocephalus.  No fluid the paranasal sinuses.  Orbits are normal.  IMPRESSION: 1.  No acute intracranial findings. 2.  Postoperative change following occipital craniotomy without evidence of complication.  CT CERVICAL SPINE  Findings: No prevertebral soft tissue swelling.  Normal  alignment of cervical vertebral bodies.  No loss of vertebral body height. Normal facet articulation.  Normal craniocervical junction.  No evidence epidural or paraspinal hematoma.  There is multilevel endplate osteophytosis and joint space narrowing unchanged from prior.  IMPRESSION: 1.  No cervical spine fracture. 2.  Multilevel spondylosis.   Original Report Authenticated By: Genevive Bi, M.D.    Ct Cervical Spine Wo  Contrast  11/22/2012  *RADIOLOGY REPORT*  Clinical Data:  Fall, altered mental status.  CT HEAD WITHOUT CONTRAST CT CERVICAL SPINE WITHOUT CONTRAST  Technique:  Multidetector CT imaging of the head and cervical spine was performed following the standard protocol without intravenous contrast.  Multiplanar CT image reconstructions of the cervical spine were also generated.  Comparison:  Head CT 10/26/2012 at  CT HEAD  Findings: Craniotomy defect in the midline occipital bone again noted. Encephalomalacia within the cerebellum.  There is no acute intracranial hemorrhage.  No CT evidence of acute infarction.  No hydrocephalus.  No fluid the paranasal sinuses.  Orbits are normal.  IMPRESSION: 1.  No acute intracranial findings. 2.  Postoperative change following occipital craniotomy without evidence of complication.  CT CERVICAL SPINE  Findings: No prevertebral soft tissue swelling.  Normal alignment of cervical vertebral bodies.  No loss of vertebral body height. Normal facet articulation.  Normal craniocervical junction.  No evidence epidural or paraspinal hematoma.  There is multilevel endplate osteophytosis and joint space narrowing unchanged from prior.  IMPRESSION: 1.  No cervical spine fracture. 2.  Multilevel spondylosis.   Original Report Authenticated By: Genevive Bi, M.D.     1. Dizziness   2. Right shoulder pain   3. Right elbow pain      MDM  Patient presents after fall at [facility]; he states he got dizzy and fell backwards, hitting his head on the wall behind him. Patient also c/o R shoulder and elbow pain. On physical exam, patient neurologically at baseline with no new focal deficits; neurovascularly intact. W/u to include CBC, BMP, troponin, CT head and C-spine, and DG R shoulder and elbow. EKG unchanged from 02/2012.  Labs significant for mild elevation in Cr to 1.48 from 1.33 one week ago, likely secondary to dehydration. Rest of labs c/w prior work ups and troponin 0.00. No  evidence of acute injury on CT head, CT cervical spine, DG R shoulder, or DG elbow. Patient has remained in good spirits; he is well and nontoxic appearing and hemodynamically stable. Appropriate for d/c to [facility] place. Indications for ED return provided. Patient and family state comfort and understanding with this d/c plan with no unaddressed concerns. Patient seen also by Dr. Adriana Simas who is in agreement with this patient's work up and management plan.   Date: 11/22/2012  Rate: 67  Rhythm: normal sinus rhythm and sinus arrhythmia  QRS Axis: left  Intervals: normal  ST/T Wave abnormalities: normal  Conduction Disutrbances:left anterior fascicular block  Narrative Interpretation: NSR with sinus arrythmia and LAFB; no STEMI.  Old EKG Reviewed: unchanged from 02/09/2012       Antony Madura, PA-C 11/24/12 [phone]

## 2012-11-22 NOTE — ED Notes (Signed)
Pt presents to department via [facility] from [facility] for evaluation of fall. States he fell while walking to bathroom, was found laying on floor in bathroom. Now c/o head and R shoulder pain. No abrasions/lacerations noted. Pt is alert and able to follow commands at the time. CBG 166. History of cerebellar hemorrhage with recent craniectomy for clot evacuation.

## 2012-11-24 NOTE — ED Provider Notes (Signed)
Medical screening examination/treatment/procedure(s) were conducted as a shared visit with non-physician practitioner(s) and myself.  I personally evaluated the patient during the encounter.  No clinical evidence of CVA  Donnetta Hutching, MD 11/24/12 1003

## 2012-11-30 ENCOUNTER — Encounter: Payer: Medicare Other | Attending: Physical Medicine & Rehabilitation | Admitting: Physical Medicine & Rehabilitation

## 2012-11-30 ENCOUNTER — Encounter: Payer: BC Managed Care – PPO | Admitting: Physical Medicine & Rehabilitation

## 2012-11-30 ENCOUNTER — Encounter: Payer: Self-pay | Admitting: Physical Medicine & Rehabilitation

## 2012-11-30 VITALS — BP 118/59 | HR 72 | Resp 14

## 2012-11-30 DIAGNOSIS — I614 Nontraumatic intracerebral hemorrhage in cerebellum: Secondary | ICD-10-CM

## 2012-11-30 DIAGNOSIS — I619 Nontraumatic intracerebral hemorrhage, unspecified: Secondary | ICD-10-CM

## 2012-11-30 DIAGNOSIS — M25511 Pain in right shoulder: Secondary | ICD-10-CM

## 2012-11-30 DIAGNOSIS — M67919 Unspecified disorder of synovium and tendon, unspecified shoulder: Secondary | ICD-10-CM | POA: Insufficient documentation

## 2012-11-30 DIAGNOSIS — M25519 Pain in unspecified shoulder: Secondary | ICD-10-CM

## 2012-11-30 DIAGNOSIS — M719 Bursopathy, unspecified: Secondary | ICD-10-CM | POA: Insufficient documentation

## 2012-11-30 NOTE — Progress Notes (Signed)
Subjective:    Patient ID: Bill Jackson, male    DOB: 12/17/1934, 78 y.o.   MRN: [id number]  HPI  Bill Jackson is here after his cerebellar hemorrhage and subsequent craniotomy. He is at [facility] currently. He is receiving therapy regarding his balance, coordination, dizziness, speech, pain, etc.   From a pain standpoint his hip and low back pain has improved. He now complains of increased right shoulder pain. He is using heat, biofreeze, lidoderm, tramadol, and/or oxycodone. He is unsure which pill he uses more frequently. Therapy has done some exercises as well with his right shoulder. He denies shoulder problems before this hospitalization   He complains of dizziness when he changes his gaze or position.   Pain Inventory Average Pain 8 Pain Right Now 0 My pain is intermittent and aching  In the last 24 hours, has pain interfered with the following? General activity Stroke 10 now in nursing facility Relation with others 4 Enjoyment of life 7 What TIME of day is your pain at its worst? morning Sleep (in general) Fair  Pain is worse with: some activites Pain improves with: therapy/exercise and medication Relief from Meds: 5  Mobility use a wheelchair  Function retired  Neuro/Psych weakness tingling trouble walking dizziness confusion  Prior Studies Any changes since last visit?  no  Physicians involved in your care Any changes since last visit?  no   History reviewed. No pertinent family history. History   Social History  . Marital Status: Divorced    Spouse Name: N/A    Number of Children: N/A  . Years of Education: N/A   Social History Main Topics  . Smoking status: Former Smoker    Quit date: 09/01/2012  . Smokeless tobacco: None  . Alcohol Use: Yes  . Drug Use: No  . Sexually Active: None   Other Topics Concern  . None   Social History Narrative  . None   Past Surgical History  Procedure Laterality Date  . Heart stents  x3  . Cardiac  surgery    . Hernia repair    . Rotator cuff repair    . Craniectomy N/A 10/25/2012    Procedure: Suboccipital Craniectomy for Evacuation of Cerebellar Hematoma;  Surgeon: Maeola Harman, MD;  Location: [facility];  Service: Neurosurgery;  Laterality: N/A;  Suboccipital Craniectomy for Evacuation of Cerebellar Hematoma   Past Medical History  Diagnosis Date  . Hypertension   . Diabetes mellitus   . Stroke    BP 118/59  Pulse 72  Resp 14  SpO2 96%     Review of Systems  Musculoskeletal: Positive for gait problem.  Neurological: Positive for dizziness and weakness.  Psychiatric/Behavioral: Positive for confusion.  All other systems reviewed and are negative.       Objective:   Physical Exam  General: Alert and oriented x 3, No apparent distress HEENT: Head is normocephalic, atraumatic, PERRLA, EOMI, sclera anicteric, oral mucosa pink and moist, dentition intact, ext ear canals clear,  Neck: Supple without JVD or lymphadenopathy Heart: Reg rate and rhythm. No murmurs rubs or gallops Chest: CTA bilaterally without wheezes, rales, or rhonchi; no distress Abdomen: Soft, non-tender, non-distended, bowel sounds positive. Extremities: No clubbing, cyanosis, or edema. Pulses are 2+ Skin: Clean and intact without signs of breakdown Neuro: Pt is cognitively appropriate with normal insight, memory, and awareness. No nystagmus seen, but pt wouldn't consistently participate in tracking exam. Acuity fair. 4 limb ataxia still noted. Sensation and motor function intact. Speech  dysarthric.  Musculoskeletal: right RTC signs. Positive pain with IR and ER as well as impingement Psych: Pt's affect is appropriate. Pt is cooperative         Assessment & Plan:  1. 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 Right rotator cuff syndrome  Plan: 1. Continue therapy to address balance, vestibular issues, equipment, and safety 2. RTC exercises to work on  ROM, strength, scapular stabilization. He does not want any injections. May use tramadol or oxycodone for breakthrough as well at the topical rx'es 3. Recommend use of zofran 4mg  q8 prn for nausea associated with his vestibular sx. It would also be useful to have meclizine 25mg  q8 prn available for vertigo/dizziness. 4. Follow up with me in 2 months time. He will need 24 assist upon dc to home.  5. 30 minutes of face to face patient care time were spent during this visit. All questions were encouraged and answered.

## 2012-11-30 NOTE — Patient Instructions (Signed)
INSTRUCTIONS FOR SNF:    1. Continue therapy to address balance, vestibular issues, equipment, and safety 2. RTC exercises to work on RIGHT SHOULDER ROM, strength, scapular stabilization. Avoid resistance exercises at this point until pain decreases.  He does not want any injections. May use tramadol or oxycodone for breakthrough as well at the topical rx'es. I can inject the shoulder if pain persists. 3. Recommend use of zofran 4mg  q8 prn for nausea associated with his vestibular sx. It would also be useful to have meclizine 25mg  q8 prn available for vertigo/dizziness. 4. Follow up with me in 2 months time. He will need 24 assist upon dc to home.

## 2013-01-19 ENCOUNTER — Encounter: Payer: Self-pay | Admitting: Nurse Practitioner

## 2013-01-19 MED ORDER — SENNOSIDES-DOCUSATE SODIUM 8.6-50 MG PO TABS: 1.0000 | ORAL_TABLET | Freq: Two times a day (BID) | ORAL | Status: DC

## 2013-01-19 MED ORDER — LATANOPROST 0.005 % OP SOLN: 1.0000 [drp] | Freq: Every day | OPHTHALMIC | Status: AC

## 2013-01-19 MED ORDER — INSULIN SYRINGES (DISPOSABLE) U-100 0.3 ML MISC: 10.0000 [IU] | Freq: Two times a day (BID) | Status: DC

## 2013-01-19 MED ORDER — GLIPIZIDE 10 MG PO TABS: 10.0000 mg | ORAL_TABLET | Freq: Two times a day (BID) | ORAL | Status: DC

## 2013-01-19 MED ORDER — ONDANSETRON HCL 4 MG PO TABS: 4.0000 mg | ORAL_TABLET | Freq: Two times a day (BID) | ORAL | Status: DC

## 2013-01-19 MED ORDER — BRIMONIDINE TARTRATE 0.2 % OP SOLN: 1.0000 [drp] | Freq: Three times a day (TID) | OPHTHALMIC | Status: AC

## 2013-01-19 MED ORDER — LOSARTAN POTASSIUM 100 MG PO TABS: 100.0000 mg | ORAL_TABLET | Freq: Every day | ORAL | Status: DC

## 2013-01-19 MED ORDER — METFORMIN HCL 1000 MG PO TABS: 1000.0000 mg | ORAL_TABLET | Freq: Two times a day (BID) | ORAL | Status: DC

## 2013-01-19 MED ORDER — PANTOPRAZOLE SODIUM 40 MG PO TBEC: 40.0000 mg | DELAYED_RELEASE_TABLET | Freq: Every day | ORAL | Status: DC

## 2013-01-19 MED ORDER — POTASSIUM CHLORIDE CRYS ER 10 MEQ PO TBCR: 10.0000 meq | EXTENDED_RELEASE_TABLET | Freq: Every day | ORAL | Status: DC

## 2013-01-19 MED ORDER — INSULIN ASPART PROT & ASPART (70-30 MIX) 100 UNIT/ML ~~LOC~~ SUSP: 10.0000 [IU] | Freq: Two times a day (BID) | SUBCUTANEOUS | Status: DC

## 2013-01-19 MED ORDER — HYDROCHLOROTHIAZIDE 25 MG PO TABS: 25.0000 mg | ORAL_TABLET | Freq: Every day | ORAL | Status: DC

## 2013-01-19 MED ORDER — FOLIC ACID 1 MG PO TABS: 1.0000 mg | ORAL_TABLET | Freq: Every day | ORAL | Status: DC

## 2013-01-19 MED ORDER — ACETAMINOPHEN 325 MG PO TABS: 325.0000 mg | ORAL_TABLET | ORAL | Status: DC | PRN

## 2013-01-19 MED ORDER — MECLIZINE HCL 25 MG PO TABS: 25.0000 mg | ORAL_TABLET | Freq: Two times a day (BID) | ORAL | Status: DC

## 2013-01-19 MED ORDER — THIAMINE HCL 100 MG PO TABS: 100.0000 mg | ORAL_TABLET | Freq: Every day | ORAL | Status: DC

## 2013-01-19 MED ORDER — DICLOFENAC SODIUM 1 % TD GEL: 4.0000 g | Freq: Three times a day (TID) | TRANSDERMAL | Status: DC

## 2013-01-19 MED ORDER — DORZOLAMIDE HCL-TIMOLOL MAL 2-0.5 % OP SOLN: 1.0000 [drp] | Freq: Two times a day (BID) | OPHTHALMIC | Status: DC

## 2013-01-19 MED ORDER — ADULT MULTIVITAMIN W/MINERALS CH: 1.0000 | ORAL_TABLET | Freq: Every day | ORAL | Status: AC

## 2013-01-30 ENCOUNTER — Encounter
Payer: BC Managed Care – PPO | Attending: Physical Medicine & Rehabilitation | Admitting: Physical Medicine & Rehabilitation

## 2013-01-30 DIAGNOSIS — M719 Bursopathy, unspecified: Secondary | ICD-10-CM | POA: Insufficient documentation

## 2013-01-30 DIAGNOSIS — I619 Nontraumatic intracerebral hemorrhage, unspecified: Secondary | ICD-10-CM | POA: Insufficient documentation

## 2013-01-30 DIAGNOSIS — M67919 Unspecified disorder of synovium and tendon, unspecified shoulder: Secondary | ICD-10-CM | POA: Insufficient documentation

## 2013-02-01 ENCOUNTER — Other Ambulatory Visit: Payer: Self-pay | Admitting: *Deleted

## 2013-03-27 NOTE — Progress Notes (Signed)
This encounter was created in error - please disregard.  This encounter was created in error - please disregard.

## 2013-03-27 NOTE — Progress Notes (Signed)
This encounter was created in error - please disregard.

## 2013-04-26 ENCOUNTER — Emergency Department (HOSPITAL_COMMUNITY): Payer: Medicare Other

## 2013-04-26 ENCOUNTER — Encounter (HOSPITAL_COMMUNITY): Payer: Self-pay | Admitting: Emergency Medicine

## 2013-04-26 ENCOUNTER — Emergency Department (HOSPITAL_COMMUNITY)
Admission: EM | Admit: 2013-04-26 | Discharge: 2013-04-26 | Disposition: A | Discharge status: Home / Self Care | Payer: Medicare Other | Attending: Emergency Medicine | Admitting: Emergency Medicine

## 2013-04-26 DIAGNOSIS — S43016A Anterior dislocation of unspecified humerus, initial encounter: Secondary | ICD-10-CM | POA: Insufficient documentation

## 2013-04-26 DIAGNOSIS — Z8673 Personal history of transient ischemic attack (TIA), and cerebral infarction without residual deficits: Secondary | ICD-10-CM | POA: Insufficient documentation

## 2013-04-26 DIAGNOSIS — W1809XA Striking against other object with subsequent fall, initial encounter: Secondary | ICD-10-CM | POA: Insufficient documentation

## 2013-04-26 DIAGNOSIS — E119 Type 2 diabetes mellitus without complications: Secondary | ICD-10-CM | POA: Insufficient documentation

## 2013-04-26 DIAGNOSIS — S2249XA Multiple fractures of ribs, unspecified side, initial encounter for closed fracture: Secondary | ICD-10-CM | POA: Insufficient documentation

## 2013-04-26 DIAGNOSIS — E162 Hypoglycemia, unspecified: Secondary | ICD-10-CM

## 2013-04-26 DIAGNOSIS — I10 Essential (primary) hypertension: Secondary | ICD-10-CM | POA: Insufficient documentation

## 2013-04-26 DIAGNOSIS — Z79899 Other long term (current) drug therapy: Secondary | ICD-10-CM | POA: Insufficient documentation

## 2013-04-26 DIAGNOSIS — Y92009 Unspecified place in unspecified non-institutional (private) residence as the place of occurrence of the external cause: Secondary | ICD-10-CM | POA: Insufficient documentation

## 2013-04-26 DIAGNOSIS — S43005A Unspecified dislocation of left shoulder joint, initial encounter: Secondary | ICD-10-CM

## 2013-04-26 DIAGNOSIS — Z87891 Personal history of nicotine dependence: Secondary | ICD-10-CM | POA: Insufficient documentation

## 2013-04-26 DIAGNOSIS — S2232XA Fracture of one rib, left side, initial encounter for closed fracture: Secondary | ICD-10-CM

## 2013-04-26 DIAGNOSIS — R42 Dizziness and giddiness: Secondary | ICD-10-CM | POA: Insufficient documentation

## 2013-04-26 DIAGNOSIS — Y939 Activity, unspecified: Secondary | ICD-10-CM | POA: Insufficient documentation

## 2013-04-26 LAB — COMPREHENSIVE METABOLIC PANEL
ALT: 17 U/L (ref 0–53)
AST: 31 U/L (ref 0–37)
Albumin: 3.8 g/dL (ref 3.5–5.2)
Alkaline Phosphatase: 77 U/L (ref 39–117)
BUN: 13 mg/dL (ref 6–23)
Calcium: 9.4 mg/dL (ref 8.4–10.5)
Chloride: 102 mEq/L (ref 96–112)
Potassium: 3.8 mEq/L (ref 3.5–5.1)
Sodium: 141 mEq/L (ref 135–145)
Total Bilirubin: 0.5 mg/dL (ref 0.3–1.2)

## 2013-04-26 LAB — URINALYSIS, ROUTINE W REFLEX MICROSCOPIC
Bilirubin Urine: NEGATIVE
Glucose, UA: NEGATIVE mg/dL
Ketones, ur: 15 mg/dL — AB
Nitrite: NEGATIVE
Protein, ur: NEGATIVE mg/dL

## 2013-04-26 LAB — CBC WITH DIFFERENTIAL/PLATELET
Band Neutrophils: 0 % (ref 0–10)
Basophils Absolute: 0 10*3/uL (ref 0.0–0.1)
Basophils Relative: 0 % (ref 0–1)
Blasts: 0 %
Eosinophils Relative: 0 % (ref 0–5)
HCT: 40.3 % (ref 39.0–52.0)
Hemoglobin: 13.9 g/dL (ref 13.0–17.0)
MCH: 31.4 pg (ref 26.0–34.0)
MCHC: 34.5 g/dL (ref 30.0–36.0)
MCV: 91.2 fL (ref 78.0–100.0)
Metamyelocytes Relative: 0 %
Myelocytes: 0 %
Neutro Abs: 10.8 10*3/uL — ABNORMAL HIGH (ref 1.7–7.7)
Neutrophils Relative %: 85 % — ABNORMAL HIGH (ref 43–77)
Promyelocytes Absolute: 0 %
RBC: 4.42 MIL/uL (ref 4.22–5.81)
RDW: 12.9 % (ref 11.5–15.5)
Smear Review: ADEQUATE

## 2013-04-26 LAB — URINE MICROSCOPIC-ADD ON

## 2013-04-26 LAB — GLUCOSE, CAPILLARY: Glucose-Capillary: 48 mg/dL — ABNORMAL LOW (ref 70–99)

## 2013-04-26 LAB — TROPONIN I: Troponin I: <0.3 ng/mL (ref <0.30)

## 2013-04-26 MED ORDER — PROPOFOL 10 MG/ML IV BOLUS
INTRAVENOUS | Status: AC
  Filled 2013-04-26: qty 20

## 2013-04-26 MED ORDER — HYDROCODONE-ACETAMINOPHEN 5-325 MG PO TABS: 1.0000 | ORAL_TABLET | ORAL | Status: DC | PRN

## 2013-04-26 MED ORDER — DEXTROSE 50 % IV SOLN
25.0000 g | Freq: Once | INTRAVENOUS | Status: AC
  Administered 2013-04-26: 25 g via INTRAVENOUS

## 2013-04-26 MED ORDER — DEXTROSE 50 % IV SOLN
INTRAVENOUS | Status: AC
  Filled 2013-04-26: qty 50

## 2013-04-26 MED ORDER — FENTANYL CITRATE 0.05 MG/ML IJ SOLN
50.0000 ug | Freq: Once | INTRAMUSCULAR | Status: AC
  Administered 2013-04-26: 50 ug via INTRAVENOUS
  Filled 2013-04-26: qty 2

## 2013-04-26 NOTE — ED Notes (Signed)
Patient in xray 

## 2013-04-26 NOTE — ED Notes (Signed)
Dr. Ranae Palms at bedside to view portable xray image; shoulder is in place Closed reduction of left humeral head dislocation successful

## 2013-04-26 NOTE — ED Notes (Signed)
Closed reduction of left shoulder dislocation completed by Dr. Ranae Palms. Patient tolerated procedure well. VSS throughout procedure.

## 2013-04-26 NOTE — ED Notes (Signed)
Pt from home, c/o fall resulting in left arm deformity. Per [facility], pt. Initial cbg was 52, received Insta-Glucose en route. Pt alert, states he feel around 9 am. Denies loc at this time. Pt c/o severe left arm pain

## 2013-04-26 NOTE — ED Notes (Signed)
Hypoglycemic Event  CBG: 48  Treatment: D50 IV 50 mL  Symptoms: None  Comments/MD notified: Dr. Ranae Palms

## 2013-04-26 NOTE — ED Notes (Signed)
Portable xray at bedside.

## 2013-04-26 NOTE — Progress Notes (Signed)
Orthopedic Tech Progress Note Patient Details:  Bill Jackson 01-02-1935 [id number]  Ortho Devices Type of Ortho Device: Sling immobilizer Ortho Device/Splint Interventions: Application   Cammer, Mickie Bail 04/26/2013, 1:47 PM

## 2013-04-26 NOTE — ED Provider Notes (Signed)
CSN: [id number]     Arrival date & time 04/26/13  1025 History   First MD Initiated Contact with Patient 04/26/13 1036     Chief Complaint  Patient presents with  . Fall  . Arm Pain   (Consider location/radiation/quality/duration/timing/severity/associated sxs/prior Treatment) HPI Patient states he was in his normal state of health. He was attempting to get up from the sitting position this morning became lightheaded and fell from standing onto the coffee table on his left side. He complains of left shoulder pain and mild left rib pain. He denies any loss of consciousness. He denies head or neck injury. He denies recent nausea vomiting diarrhea. He denies recent urinary symptoms, chest pain, shortness of breath, cough or any focal weakness or numbness.  Past Medical History  Diagnosis Date  . Hypertension   . Diabetes mellitus   . Stroke    Past Surgical History  Procedure Laterality Date  . Heart stents  x3  . Cardiac surgery    . Hernia repair    . Rotator cuff repair    . Craniectomy N/A 10/25/2012    Procedure: Suboccipital Craniectomy for Evacuation of Cerebellar Hematoma;  Surgeon: Maeola Harman, MD;  Location: [facility];  Service: Neurosurgery;  Laterality: N/A;  Suboccipital Craniectomy for Evacuation of Cerebellar Hematoma   History reviewed. No pertinent family history. History  Substance Use Topics  . Smoking status: Former Smoker    Quit date: 09/01/2012  . Smokeless tobacco: Not on file  . Alcohol Use: Yes    Review of Systems  Constitutional: Negative for fever and chills.  HENT: Negative for neck pain.   Eyes: Negative for visual disturbance.  Respiratory: Negative for cough and shortness of breath.   Cardiovascular: Negative for chest pain.  Gastrointestinal: Negative for nausea, vomiting, abdominal pain and diarrhea.  Genitourinary: Negative for dysuria and frequency.  Musculoskeletal: Positive for arthralgias. Negative for back pain.  Neurological:  Positive for dizziness and light-headedness. Negative for syncope, weakness, numbness and headaches.  All other systems reviewed and are negative.    Allergies  Lotensin and Penicillins  Home Medications   Current Outpatient Rx  Name  Route  Sig  Dispense  Refill  . acetaminophen (TYLENOL) 325 MG tablet   Oral   Take 1-2 tablets (325-650 mg total) by mouth every 4 (four) hours as needed.         . brimonidine (ALPHAGAN) 0.2 % ophthalmic solution   Both Eyes   Place 1 drop into both eyes every 8 (eight) hours.   5 mL   0   . diclofenac sodium (VOLTAREN) 1 % GEL   Topical   Apply 4 g topically 3 (three) times daily. Apply to knees   100 g   0   . dorzolamide-timolol (COSOPT) 22.3-6.8 MG/ML ophthalmic solution   Right Eye   Place 1 drop into the right eye 2 (two) times daily.   10 mL   0   . feeding supplement (GLUCERNA SHAKE) LIQD   Oral   Take 237 mLs by mouth daily.         . folic acid (FOLVITE) 1 MG tablet   Oral   Take 1 tablet (1 mg total) by mouth daily.   30 tablet   0   . glipiZIDE (GLUCOTROL) 10 MG tablet   Oral   Take 1 tablet (10 mg total) by mouth 2 (two) times daily before a meal.   60 tablet   0   .  hydrochlorothiazide (HYDRODIURIL) 25 MG tablet   Oral   Take 1 tablet (25 mg total) by mouth daily.   30 tablet   0   . insulin aspart protamine- aspart (NOVOLOG MIX 70/30) (70-30) 100 UNIT/ML injection   Subcutaneous   Inject 0.1 mLs (10 Units total) into the skin 2 (two) times daily with a meal.   10 mL   0   . Insulin Syringes, Disposable, U-100 0.3 ML MISC   Does not apply   10 Units by Does not apply route 2 (two) times daily.   60 each   0   . latanoprost (XALATAN) 0.005 % ophthalmic solution   Both Eyes   Place 1 drop into both eyes at bedtime.   2.5 mL   0   . losartan (COZAAR) 100 MG tablet   Oral   Take 1 tablet (100 mg total) by mouth daily.   30 tablet   0   . meclizine (ANTIVERT) 25 MG tablet   Oral   Take 1  tablet (25 mg total) by mouth 2 (two) times daily.   30 tablet   0   . metFORMIN (GLUCOPHAGE) 1000 MG tablet   Oral   Take 1 tablet (1,000 mg total) by mouth 2 (two) times daily with a meal.   60 tablet   0   . Multiple Vitamin (MULTIVITAMIN WITH MINERALS) TABS   Oral   Take 1 tablet by mouth daily.         . ondansetron (ZOFRAN) 4 MG tablet   Oral   Take 1 tablet (4 mg total) by mouth 2 (two) times daily.   30 tablet   0   . pantoprazole (PROTONIX) 40 MG tablet   Oral   Take 1 tablet (40 mg total) by mouth daily.   30 tablet   0   . potassium chloride (K-DUR,KLOR-CON) 10 MEQ tablet   Oral   Take 1 tablet (10 mEq total) by mouth daily.   30 tablet   0   . senna-docusate (SENOKOT-S) 8.6-50 MG per tablet   Oral   Take 1 tablet by mouth 2 (two) times daily.         Marland Kitchen thiamine 100 MG tablet   Oral   Take 1 tablet (100 mg total) by mouth daily.   30 tablet   0    BP 168/107  Pulse 76  Temp(Src) 97.5 F (36.4 C) (Oral)  Resp 18  Wt 170 lb (77.111 kg)  BMI 26.62 kg/m2  SpO2 99% Physical Exam  Nursing note and vitals reviewed. Constitutional: He is oriented to person, place, and time. He appears well-developed and well-nourished. No distress.  HENT:  Head: Normocephalic and atraumatic.  Mouth/Throat: Oropharynx is clear and moist.  Eyes: EOM are normal. Pupils are equal, round, and reactive to light.  Neck: Normal range of motion. Neck supple.  No posterior cervical spine midline tenderness  Cardiovascular: Normal rate and regular rhythm.   Pulmonary/Chest: Effort normal and breath sounds normal. No respiratory distress. He has no wheezes. He has no rales. He exhibits no tenderness.  Abdominal: Soft. Bowel sounds are normal. He exhibits no distension and no mass. There is no tenderness. There is no rebound and no guarding.  Musculoskeletal: Normal range of motion. He exhibits no edema and no tenderness.  No calf swelling or pain. Patient has decreased range  of motion of the left shoulder due to pain. He is in a sling currently. He is tender to  palpation over his anterior shoulder and the distal clavicle he has no tenderness to palpation over his left elbow or wrist. He has good grip strength of his left side. Sensation is fully intact. Radial pulses are bilaterally intact.  Neurological: He is alert and oriented to person, place, and time.  Patient is alert and oriented x3 with clear, goal oriented speech. Patient has 5/5 motor in all extremities. Sensation is intact to light touch.    Skin: Skin is warm and dry. No rash noted. No erythema.  Psychiatric: He has a normal mood and affect. His behavior is normal.    ED Course  Reduction of dislocation Date/Time: 04/26/2013 1:01 PM Performed by: Loren Racer Authorized by: Ranae Palms, Andreya Lacks Consent: Verbal consent obtained. written consent obtained. Consent given by: patient Imaging studies: imaging studies available Patient identity confirmed: verbally with patient and arm band Time out: Immediately prior to procedure a "time out" was called to verify the correct patient, procedure, equipment, support staff and site/side marked as required. Patient sedated: yes Sedatives: propofol and see MAR for details Sedation start date/time: 04/26/2013 12:45 PM Sedation end date/time: 04/26/2013 1:01 PM Patient tolerance: Patient tolerated the procedure well with no immediate complications. Comments: Traction counter traction with clunk and return of normal shoulder anatomy. Radial pulses 2+ post procedure.   (including critical care time) Labs Review Labs Reviewed  CBC WITH DIFFERENTIAL  COMPREHENSIVE METABOLIC PANEL  URINALYSIS, ROUTINE W REFLEX MICROSCOPIC  TROPONIN I   Imaging Review No results found.  Date: 04/26/2013  Rate: 77  Rhythm: normal sinus rhythm  QRS Axis: normal  Intervals: normal  ST/T Wave abnormalities: normal  Conduction Disutrbances:left anterior fascicular block   Narrative Interpretation:   Old EKG Reviewed: When compared with prior EKGs no significant changes are seen   MDM  Patient is feeling much better after eating. His left shoulder has been reduced and placed in a sling. He's been instructed to followup with orthopedics in one week. He'll be given pain medication for his broken ribs. He is advised to return immediately for worsening symptoms or concerns. Patient is counseled to eat regularly.  Loren Racer, MD 04/27/13 [phone]

## 2013-06-01 ENCOUNTER — Encounter (HOSPITAL_COMMUNITY): Payer: Self-pay | Admitting: Radiology

## 2013-06-01 ENCOUNTER — Emergency Department (HOSPITAL_COMMUNITY)
Admission: EM | Admit: 2013-06-01 | Discharge: 2013-06-01 | Disposition: A | Discharge status: Home / Self Care | Payer: Medicare Other | Attending: Emergency Medicine | Admitting: Emergency Medicine

## 2013-06-01 ENCOUNTER — Emergency Department (HOSPITAL_COMMUNITY): Payer: Medicare Other

## 2013-06-01 DIAGNOSIS — E119 Type 2 diabetes mellitus without complications: Secondary | ICD-10-CM | POA: Insufficient documentation

## 2013-06-01 DIAGNOSIS — I10 Essential (primary) hypertension: Secondary | ICD-10-CM | POA: Insufficient documentation

## 2013-06-01 DIAGNOSIS — R531 Weakness: Secondary | ICD-10-CM

## 2013-06-01 DIAGNOSIS — Z880 Allergy status to penicillin: Secondary | ICD-10-CM | POA: Insufficient documentation

## 2013-06-01 DIAGNOSIS — Z79899 Other long term (current) drug therapy: Secondary | ICD-10-CM | POA: Insufficient documentation

## 2013-06-01 DIAGNOSIS — Z794 Long term (current) use of insulin: Secondary | ICD-10-CM | POA: Insufficient documentation

## 2013-06-01 DIAGNOSIS — Z8673 Personal history of transient ischemic attack (TIA), and cerebral infarction without residual deficits: Secondary | ICD-10-CM | POA: Insufficient documentation

## 2013-06-01 DIAGNOSIS — R5381 Other malaise: Secondary | ICD-10-CM | POA: Insufficient documentation

## 2013-06-01 DIAGNOSIS — M6281 Muscle weakness (generalized): Secondary | ICD-10-CM | POA: Insufficient documentation

## 2013-06-01 DIAGNOSIS — Z87891 Personal history of nicotine dependence: Secondary | ICD-10-CM | POA: Insufficient documentation

## 2013-06-01 LAB — COMPREHENSIVE METABOLIC PANEL
ALT: 11 U/L (ref 0–53)
AST: 16 U/L (ref 0–37)
Albumin: 3.5 g/dL (ref 3.5–5.2)
Calcium: 9.5 mg/dL (ref 8.4–10.5)
Sodium: 145 mEq/L (ref 135–145)
Total Protein: 6.8 g/dL (ref 6.0–8.3)

## 2013-06-01 LAB — CBC WITH DIFFERENTIAL/PLATELET
Basophils Absolute: 0 10*3/uL (ref 0.0–0.1)
Basophils Relative: 0 % (ref 0–1)
Eosinophils Absolute: 0 10*3/uL (ref 0.0–0.7)
Eosinophils Relative: 0 % (ref 0–5)
HCT: 37.2 % — ABNORMAL LOW (ref 39.0–52.0)
Hemoglobin: 12.7 g/dL — ABNORMAL LOW (ref 13.0–17.0)
Lymphocytes Relative: 22 % (ref 12–46)
MCH: 30.8 pg (ref 26.0–34.0)
MCV: 90.1 fL (ref 78.0–100.0)
Monocytes Relative: 8 % (ref 3–12)
Platelets: 279 10*3/uL (ref 150–400)
RDW: 13.2 % (ref 11.5–15.5)
WBC: 5.9 10*3/uL (ref 4.0–10.5)

## 2013-06-01 LAB — URINALYSIS, ROUTINE W REFLEX MICROSCOPIC
Hgb urine dipstick: NEGATIVE
Protein, ur: NEGATIVE mg/dL
Specific Gravity, Urine: 1.006 (ref 1.005–1.030)
Urobilinogen, UA: 0.2 mg/dL (ref 0.0–1.0)
pH: 7 (ref 5.0–8.0)

## 2013-06-01 LAB — CK: Total CK: 130 U/L (ref 7–232)

## 2013-06-01 MED ORDER — MORPHINE SULFATE 4 MG/ML IJ SOLN: 4.0000 mg | Freq: Once | INTRAMUSCULAR | Status: DC

## 2013-06-01 MED ORDER — ACETAMINOPHEN 325 MG PO TABS
650.0000 mg | ORAL_TABLET | Freq: Once | ORAL | Status: AC
  Administered 2013-06-01: 650 mg via ORAL
  Filled 2013-06-01: qty 2

## 2013-06-01 NOTE — ED Provider Notes (Signed)
CSN: [id number]     Arrival date & time 06/01/13  0301 History   First MD Initiated Contact with Patient 06/01/13 [phone]     Chief Complaint  Patient presents with  . Weakness  . Extremity Weakness   (Consider location/radiation/quality/duration/timing/severity/associated sxs/prior Treatment) Patient is a 78 y.o. male presenting with weakness and extremity weakness. The history is provided by the patient.  Weakness  Extremity Weakness  He is status post stroke with left sided weakness and also status post recent dislocation of the left shoulder. He woke up at 12:30 AM with a sense that he had to go the bathroom and urinate. This is unusual for him. However, he was completely unable to move. He urinated in bed because he was unable to get up. Is not sure how long the episode lasted, but he feels like he is back to his baseline now. He was not aware of any shaking and there was no incontinence except as described above. He states that he has had 2 similar episodes over the past 25 years but there were generally short lived. He denies headache, chest pain, dyspnea, nausea.   Past Medical History  Diagnosis Date  . Hypertension   . Diabetes mellitus   . Stroke    Past Surgical History  Procedure Laterality Date  . Heart stents  x3  . Cardiac surgery    . Hernia repair    . Rotator cuff repair    . Craniectomy N/A 10/25/2012    Procedure: Suboccipital Craniectomy for Evacuation of Cerebellar Hematoma;  Surgeon: Maeola Harman, MD;  Location: [facility];  Service: Neurosurgery;  Laterality: N/A;  Suboccipital Craniectomy for Evacuation of Cerebellar Hematoma   No family history on file. History  Substance Use Topics  . Smoking status: Former Smoker    Quit date: 09/01/2012  . Smokeless tobacco: Not on file  . Alcohol Use: Yes    Review of Systems  Musculoskeletal: Positive for extremity weakness.  Neurological: Positive for weakness.  All other systems reviewed and are  negative.    Allergies  Lotensin and Penicillins  Home Medications   Current Outpatient Rx  Name  Route  Sig  Dispense  Refill  . acetaminophen (TYLENOL) 325 MG tablet   Oral   Take 1-2 tablets (325-650 mg total) by mouth every 4 (four) hours as needed.         . brimonidine (ALPHAGAN) 0.2 % ophthalmic solution   Both Eyes   Place 1 drop into both eyes every 8 (eight) hours.   5 mL   0   . dorzolamide-timolol (COSOPT) 22.3-6.8 MG/ML ophthalmic solution   Right Eye   Place 1 drop into the right eye 2 (two) times daily.   10 mL   0   . folic acid (FOLVITE) 1 MG tablet   Oral   Take 1 tablet (1 mg total) by mouth daily.   30 tablet   0   . glipiZIDE (GLUCOTROL) 10 MG tablet   Oral   Take 1 tablet (10 mg total) by mouth 2 (two) times daily before a meal.   60 tablet   0   . hydrochlorothiazide (HYDRODIURIL) 25 MG tablet   Oral   Take 1 tablet (25 mg total) by mouth daily.   30 tablet   0   . HYDROcodone-acetaminophen (NORCO) 5-325 MG per tablet   Oral   Take 1 tablet by mouth every 4 (four) hours as needed for pain.   20 tablet  0   . insulin aspart protamine- aspart (NOVOLOG MIX 70/30) (70-30) 100 UNIT/ML injection   Subcutaneous   Inject 0.1 mLs (10 Units total) into the skin 2 (two) times daily with a meal.   10 mL   0   . Insulin Syringes, Disposable, U-100 0.3 ML MISC   Does not apply   10 Units by Does not apply route 2 (two) times daily.   60 each   0   . latanoprost (XALATAN) 0.005 % ophthalmic solution   Both Eyes   Place 1 drop into both eyes at bedtime.   2.5 mL   0   . losartan (COZAAR) 100 MG tablet   Oral   Take 50 mg by mouth daily.         . meclizine (ANTIVERT) 25 MG tablet   Oral   Take 1 tablet (25 mg total) by mouth 2 (two) times daily.   30 tablet   0   . Multiple Vitamin (MULTIVITAMIN WITH MINERALS) TABS   Oral   Take 1 tablet by mouth daily.         . ondansetron (ZOFRAN) 4 MG tablet   Oral   Take 1 tablet  (4 mg total) by mouth 2 (two) times daily.   30 tablet   0   . pantoprazole (PROTONIX) 40 MG tablet   Oral   Take 1 tablet (40 mg total) by mouth daily.   30 tablet   0   . potassium chloride (K-DUR,KLOR-CON) 10 MEQ tablet   Oral   Take 1 tablet (10 mEq total) by mouth daily.   30 tablet   0   . thiamine 100 MG tablet   Oral   Take 1 tablet (100 mg total) by mouth daily.   30 tablet   0    BP 124/67  Pulse 75  Resp 13  SpO2 100% Physical Exam  Nursing note and vitals reviewed.  78 year old male, resting comfortably and in no acute distress. Vital signs are normal. Oxygen saturation is 100%, which is normal. Head is normocephalic and atraumatic. PERRLA, EOMI. Oropharynx is clear. Neck is nontender and supple without adenopathy or JVD. Back is nontender and there is no CVA tenderness. Lungs are clear without rales, wheezes, or rhonchi. Chest is nontender. Heart has regular rate and rhythm without murmur. Abdomen is soft, flat, nontender without masses or hepatosplenomegaly and peristalsis is normoactive. Extremities have no cyanosis or edema. Left arm is immobilized in a sling. Skin is warm and dry without rash. Neurologic: He is awake and alert, speech is dysarthric, cranial nerves are intact, there is mild left hemiparesis but is able to move all 4 extremities.  ED Course  Procedures (including critical care time) Labs Review Results for orders placed during the hospital encounter of 06/01/13  URINALYSIS, ROUTINE W REFLEX MICROSCOPIC      Result Value Range   Color, Urine YELLOW  YELLOW   APPearance CLEAR  CLEAR   Specific Gravity, Urine 1.006  1.005 - 1.030   pH 7.0  5.0 - 8.0   Glucose, UA NEGATIVE  NEGATIVE mg/dL   Hgb urine dipstick NEGATIVE  NEGATIVE   Bilirubin Urine NEGATIVE  NEGATIVE   Ketones, ur NEGATIVE  NEGATIVE mg/dL   Protein, ur NEGATIVE  NEGATIVE mg/dL   Urobilinogen, UA 0.2  0.0 - 1.0 mg/dL   Nitrite NEGATIVE  NEGATIVE   Leukocytes, UA  NEGATIVE  NEGATIVE  CBC WITH DIFFERENTIAL  Result Value Range   WBC 5.9  4.0 - 10.5 K/uL   RBC 4.13 (*) 4.22 - 5.81 MIL/uL   Hemoglobin 12.7 (*) 13.0 - 17.0 g/dL   HCT 40.9 (*) 81.1 - 91.4 %   MCV 90.1  78.0 - 100.0 fL   MCH 30.8  26.0 - 34.0 pg   MCHC 34.1  30.0 - 36.0 g/dL   RDW 78.2  95.6 - 21.3 %   Platelets 279  150 - 400 K/uL   Neutrophils Relative % 70  43 - 77 %   Neutro Abs 4.1  1.7 - 7.7 K/uL   Lymphocytes Relative 22  12 - 46 %   Lymphs Abs 1.3  0.7 - 4.0 K/uL   Monocytes Relative 8  3 - 12 %   Monocytes Absolute 0.5  0.1 - 1.0 K/uL   Eosinophils Relative 0  0 - 5 %   Eosinophils Absolute 0.0  0.0 - 0.7 K/uL   Basophils Relative 0  0 - 1 %   Basophils Absolute 0.0  0.0 - 0.1 K/uL  COMPREHENSIVE METABOLIC PANEL      Result Value Range   Sodium 145  135 - 145 mEq/L   Potassium 4.4  3.5 - 5.1 mEq/L   Chloride 106  96 - 112 mEq/L   CO2 29  19 - 32 mEq/L   Glucose, Bld 78  70 - 99 mg/dL   BUN 17  6 - 23 mg/dL   Creatinine, Ser 0.86  0.50 - 1.35 mg/dL   Calcium 9.5  8.4 - 57.8 mg/dL   Total Protein 6.8  6.0 - 8.3 g/dL   Albumin 3.5  3.5 - 5.2 g/dL   AST 16  0 - 37 U/L   ALT 11  0 - 53 U/L   Alkaline Phosphatase 57  39 - 117 U/L   Total Bilirubin 0.3  0.3 - 1.2 mg/dL   GFR calc non Af Amer 53 (*) >90 mL/min   GFR calc Af Amer 62 (*) >90 mL/min  CK      Result Value Range   Total CK 130  7 - 232 U/L   Imaging Review Ct Head Wo Contrast  06/01/2013   CLINICAL DATA:  Generalized weakness. Extremity weakness.  EXAM: CT HEAD WITHOUT CONTRAST  TECHNIQUE: Contiguous axial images were obtained from the base of the skull through the vertex without intravenous contrast.  COMPARISON:  11/22/2012  FINDINGS: Skull and Sinuses:Status post suboccipital craniotomy for treatment of posterior fossa hemorrhage.  Orbits: Bilateral cataract resection.  Brain: No evidence of acute abnormality, such as acute infarction, hemorrhage, hydrocephalus, or mass lesion/mass effect. Unchanged  pattern of encephalomalacia and gliosis in the cerebellum, mainly the vermis and right hemisphere, related to previous hemorrhagic stroke. Patchy bilateral cerebral white matter low density is also stable, consistent with chronic small vessel ischemia.  IMPRESSION: Stable appearance of the brain. No evidence of acute intracranial disease.   Electronically Signed   By: Tiburcio Pea M.D.   On: 06/01/2013 05:42    MDM   1. Generalized weakness    Episode of paralysis that has resolved. This could conceivably be a transient ischemic attack. It could be a seizure and post ictal state. Old records are reviewed and he had a recent hospitalization for cerebellar hemorrhage and then for rehabilitation. Head CT will be obtained as well as screening labs. He'll need to get an EEG as an outpatient.  Workup is unremarkable. Patient is reevaluated and he confirms  he is back to his baseline. CK is normal which would argue against recent seizure. He is told to make arrangements for EEG. His PCP is with the [facility] in [place]. He is also referred to neurology in [place].  Dione Booze, MD 06/01/13 [phone]

## 2013-06-01 NOTE — ED Notes (Signed)
Pt brought in by EMS, pt had episode of incontinence (not normal), Pt wanted to be checked out for generalized weakness and shoulder pain

## 2013-06-01 NOTE — ED Notes (Signed)
PT. Alert and oriented x4. Stable at baseline. Pt. Goddaughter to pick up.

## 2013-06-11 ENCOUNTER — Emergency Department (HOSPITAL_COMMUNITY): Payer: Medicare Other

## 2013-06-11 ENCOUNTER — Observation Stay (HOSPITAL_COMMUNITY)
Admission: EM | Admit: 2013-06-11 | Discharge: 2013-06-13 | Disposition: A | Discharge status: Home / Self Care | Payer: Medicare Other | Attending: Internal Medicine | Admitting: Internal Medicine

## 2013-06-11 ENCOUNTER — Encounter (HOSPITAL_COMMUNITY): Payer: Self-pay | Admitting: Emergency Medicine

## 2013-06-11 DIAGNOSIS — M1710 Unilateral primary osteoarthritis, unspecified knee: Secondary | ICD-10-CM

## 2013-06-11 DIAGNOSIS — Z9861 Coronary angioplasty status: Secondary | ICD-10-CM | POA: Insufficient documentation

## 2013-06-11 DIAGNOSIS — M545 Low back pain, unspecified: Secondary | ICD-10-CM

## 2013-06-11 DIAGNOSIS — H409 Unspecified glaucoma: Secondary | ICD-10-CM

## 2013-06-11 DIAGNOSIS — Y92009 Unspecified place in unspecified non-institutional (private) residence as the place of occurrence of the external cause: Secondary | ICD-10-CM | POA: Insufficient documentation

## 2013-06-11 DIAGNOSIS — R42 Dizziness and giddiness: Secondary | ICD-10-CM

## 2013-06-11 DIAGNOSIS — Z87891 Personal history of nicotine dependence: Secondary | ICD-10-CM | POA: Insufficient documentation

## 2013-06-11 DIAGNOSIS — E119 Type 2 diabetes mellitus without complications: Secondary | ICD-10-CM | POA: Diagnosis present

## 2013-06-11 DIAGNOSIS — I614 Nontraumatic intracerebral hemorrhage in cerebellum: Secondary | ICD-10-CM

## 2013-06-11 DIAGNOSIS — Z9889 Other specified postprocedural states: Secondary | ICD-10-CM | POA: Insufficient documentation

## 2013-06-11 DIAGNOSIS — R279 Unspecified lack of coordination: Secondary | ICD-10-CM | POA: Insufficient documentation

## 2013-06-11 DIAGNOSIS — W19XXXA Unspecified fall, initial encounter: Secondary | ICD-10-CM | POA: Insufficient documentation

## 2013-06-11 DIAGNOSIS — S0180XA Unspecified open wound of other part of head, initial encounter: Secondary | ICD-10-CM | POA: Insufficient documentation

## 2013-06-11 DIAGNOSIS — M179 Osteoarthritis of knee, unspecified: Secondary | ICD-10-CM

## 2013-06-11 DIAGNOSIS — E43 Unspecified severe protein-calorie malnutrition: Secondary | ICD-10-CM | POA: Insufficient documentation

## 2013-06-11 DIAGNOSIS — R51 Headache: Secondary | ICD-10-CM

## 2013-06-11 DIAGNOSIS — I10 Essential (primary) hypertension: Secondary | ICD-10-CM | POA: Diagnosis present

## 2013-06-11 DIAGNOSIS — Y939 Activity, unspecified: Secondary | ICD-10-CM | POA: Insufficient documentation

## 2013-06-11 DIAGNOSIS — I69922 Dysarthria following unspecified cerebrovascular disease: Secondary | ICD-10-CM | POA: Insufficient documentation

## 2013-06-11 DIAGNOSIS — M25511 Pain in right shoulder: Secondary | ICD-10-CM | POA: Diagnosis present

## 2013-06-11 DIAGNOSIS — Z888 Allergy status to other drugs, medicaments and biological substances status: Secondary | ICD-10-CM | POA: Insufficient documentation

## 2013-06-11 DIAGNOSIS — Z79899 Other long term (current) drug therapy: Secondary | ICD-10-CM

## 2013-06-11 DIAGNOSIS — Z880 Allergy status to penicillin: Secondary | ICD-10-CM | POA: Insufficient documentation

## 2013-06-11 DIAGNOSIS — Z794 Long term (current) use of insulin: Secondary | ICD-10-CM | POA: Insufficient documentation

## 2013-06-11 DIAGNOSIS — S0181XA Laceration without foreign body of other part of head, initial encounter: Secondary | ICD-10-CM

## 2013-06-11 DIAGNOSIS — R270 Ataxia, unspecified: Secondary | ICD-10-CM

## 2013-06-11 DIAGNOSIS — I517 Cardiomegaly: Secondary | ICD-10-CM

## 2013-06-11 DIAGNOSIS — R059 Cough, unspecified: Secondary | ICD-10-CM

## 2013-06-11 DIAGNOSIS — R05 Cough: Secondary | ICD-10-CM

## 2013-06-11 DIAGNOSIS — I69959 Hemiplegia and hemiparesis following unspecified cerebrovascular disease affecting unspecified side: Secondary | ICD-10-CM | POA: Insufficient documentation

## 2013-06-11 DIAGNOSIS — R55 Syncope and collapse: Principal | ICD-10-CM

## 2013-06-11 DIAGNOSIS — R531 Weakness: Secondary | ICD-10-CM | POA: Diagnosis present

## 2013-06-11 DIAGNOSIS — E876 Hypokalemia: Secondary | ICD-10-CM

## 2013-06-11 LAB — CBC WITH DIFFERENTIAL/PLATELET
HCT: 37.2 % — ABNORMAL LOW (ref 39.0–52.0)
Hemoglobin: 12.8 g/dL — ABNORMAL LOW (ref 13.0–17.0)
Lymphocytes Relative: 21 % (ref 12–46)
MCHC: 34.4 g/dL (ref 30.0–36.0)
MCV: 89.2 fL (ref 78.0–100.0)
Monocytes Absolute: 0.3 10*3/uL (ref 0.1–1.0)
Monocytes Relative: 5 % (ref 3–12)
Neutro Abs: 4.8 10*3/uL (ref 1.7–7.7)
Neutrophils Relative %: 74 % (ref 43–77)
RBC: 4.17 MIL/uL — ABNORMAL LOW (ref 4.22–5.81)
WBC: 6.5 10*3/uL (ref 4.0–10.5)

## 2013-06-11 LAB — POCT I-STAT TROPONIN I: Troponin i, poc: 0 ng/mL (ref 0.00–0.08)

## 2013-06-11 LAB — GLUCOSE, CAPILLARY
Glucose-Capillary: 61 mg/dL — ABNORMAL LOW (ref 70–99)
Glucose-Capillary: 121 mg/dL — ABNORMAL HIGH (ref 70–99)
Glucose-Capillary: 230 mg/dL — ABNORMAL HIGH (ref 70–99)
Glucose-Capillary: 184 mg/dL — ABNORMAL HIGH (ref 70–99)
Glucose-Capillary: 236 mg/dL — ABNORMAL HIGH (ref 70–99)

## 2013-06-11 LAB — URINALYSIS, ROUTINE W REFLEX MICROSCOPIC
Bilirubin Urine: NEGATIVE
Hgb urine dipstick: NEGATIVE
Nitrite: NEGATIVE
Specific Gravity, Urine: 1.005 (ref 1.005–1.030)
pH: 5.5 (ref 5.0–8.0)

## 2013-06-11 LAB — BASIC METABOLIC PANEL
BUN: 18 mg/dL (ref 6–23)
Chloride: 104 mEq/L (ref 96–112)
GFR calc Af Amer: 57 mL/min — ABNORMAL LOW (ref >90)
GFR calc non Af Amer: 49 mL/min — ABNORMAL LOW (ref >90)
Potassium: 4 mEq/L (ref 3.5–5.1)
Sodium: 141 mEq/L (ref 135–145)

## 2013-06-11 LAB — HEMOGLOBIN A1C: Mean Plasma Glucose: 131 mg/dL — ABNORMAL HIGH (ref <117)

## 2013-06-11 LAB — ETHANOL: Alcohol, Ethyl (B): <11 mg/dL (ref 0–11)

## 2013-06-11 MED ORDER — PANTOPRAZOLE SODIUM 40 MG PO TBEC
40.0000 mg | DELAYED_RELEASE_TABLET | Freq: Every day | ORAL | Status: DC
Start: 2013-06-11 — End: 2013-06-13
  Administered 2013-06-11 – 2013-06-13 (×3): 40 mg via ORAL
  Filled 2013-06-11 (×3): qty 1

## 2013-06-11 MED ORDER — DORZOLAMIDE HCL-TIMOLOL MAL 2-0.5 % OP SOLN
1.0000 [drp] | Freq: Two times a day (BID) | OPHTHALMIC | Status: DC
  Administered 2013-06-11 – 2013-06-13 (×4): 1 [drp] via OPHTHALMIC
  Filled 2013-06-11: qty 10

## 2013-06-11 MED ORDER — ADULT MULTIVITAMIN W/MINERALS CH
1.0000 | ORAL_TABLET | Freq: Every day | ORAL | Status: DC
  Administered 2013-06-11 – 2013-06-13 (×3): 1 via ORAL
  Filled 2013-06-11 (×3): qty 1

## 2013-06-11 MED ORDER — SODIUM CHLORIDE 0.9 % IJ SOLN
3.0000 mL | Freq: Two times a day (BID) | INTRAMUSCULAR | Status: DC
  Administered 2013-06-11 – 2013-06-13 (×3): 3 mL via INTRAVENOUS

## 2013-06-11 MED ORDER — LATANOPROST 0.005 % OP SOLN
1.0000 [drp] | Freq: Every day | OPHTHALMIC | Status: DC
  Administered 2013-06-12: 1 [drp] via OPHTHALMIC
  Filled 2013-06-11: qty 2.5

## 2013-06-11 MED ORDER — VITAMIN B-1 100 MG PO TABS
100.0000 mg | ORAL_TABLET | Freq: Every day | ORAL | Status: DC
  Administered 2013-06-11 – 2013-06-13 (×3): 100 mg via ORAL
  Filled 2013-06-11 (×3): qty 1

## 2013-06-11 MED ORDER — LOSARTAN POTASSIUM 50 MG PO TABS
50.0000 mg | ORAL_TABLET | Freq: Every day | ORAL | Status: DC
  Administered 2013-06-11 – 2013-06-13 (×3): 50 mg via ORAL
  Filled 2013-06-11 (×3): qty 1

## 2013-06-11 MED ORDER — MECLIZINE HCL 25 MG PO TABS
25.0000 mg | ORAL_TABLET | Freq: Two times a day (BID) | ORAL | Status: DC
  Administered 2013-06-11 – 2013-06-13 (×4): 25 mg via ORAL
  Filled 2013-06-11 (×6): qty 1

## 2013-06-11 MED ORDER — FOLIC ACID 1 MG PO TABS
1.0000 mg | ORAL_TABLET | Freq: Every day | ORAL | Status: DC
  Administered 2013-06-11 – 2013-06-13 (×3): 1 mg via ORAL
  Filled 2013-06-11 (×3): qty 1

## 2013-06-11 MED ORDER — HYDROCODONE-ACETAMINOPHEN 5-325 MG PO TABS
1.0000 | ORAL_TABLET | ORAL | Status: DC | PRN
  Administered 2013-06-11: 1 via ORAL
  Filled 2013-06-11: qty 1

## 2013-06-11 MED ORDER — HYDROCHLOROTHIAZIDE 25 MG PO TABS
25.0000 mg | ORAL_TABLET | Freq: Every day | ORAL | Status: DC
  Administered 2013-06-11 – 2013-06-13 (×3): 25 mg via ORAL
  Filled 2013-06-11 (×3): qty 1

## 2013-06-11 MED ORDER — INSULIN ASPART 100 UNIT/ML ~~LOC~~ SOLN
0.0000 [IU] | Freq: Three times a day (TID) | SUBCUTANEOUS | Status: DC
  Administered 2013-06-11 (×2): 2 [IU] via SUBCUTANEOUS
  Administered 2013-06-11: 3 [IU] via SUBCUTANEOUS
  Administered 2013-06-12 (×2): 2 [IU] via SUBCUTANEOUS
  Administered 2013-06-12: 3 [IU] via SUBCUTANEOUS
  Administered 2013-06-13: 1 [IU] via SUBCUTANEOUS
  Administered 2013-06-13: 5 [IU] via SUBCUTANEOUS

## 2013-06-11 MED ORDER — BRIMONIDINE TARTRATE 0.2 % OP SOLN
1.0000 [drp] | Freq: Three times a day (TID) | OPHTHALMIC | Status: DC
  Administered 2013-06-11 – 2013-06-13 (×5): 1 [drp] via OPHTHALMIC
  Filled 2013-06-11: qty 5

## 2013-06-11 NOTE — Evaluation (Signed)
Physical Therapy Evaluation Patient Details Name: Bill Jackson MRN: [id number] DOB: 17-May-1935 Today's Date: 06/11/2013 Time: 2130-8657 PT Time Calculation (min): 15 min  PT Assessment / Plan / Recommendation History of Present Illness  Patient is a 78 yo male admitted following syncope and fall.  Patient has had multiple falls at home, one in September with dislocated left shoulder.  Clinical Impression  Patient presents with problems listed below.  Will benefit from acute PT to maximize independence and safety prior to discharge.  Patient has had multiple falls (with injury) at home.  Does not have 24 hour assist.  Recommend SNF at discharge for continued therapy.    PT Assessment  Patient needs continued PT services    Follow Up Recommendations  SNF    Does the patient have the potential to tolerate intense rehabilitation      Barriers to Discharge Decreased caregiver support Patient lives alone.  Reports he does not have 24 hour assist.    Equipment Recommendations  None recommended by PT    Recommendations for Other Services     Frequency Min 3X/week    Precautions / Restrictions Precautions Precautions: Fall Precaution Comments: Multiple falls at home Restrictions Weight Bearing Restrictions: No   Pertinent Vitals/Pain       Mobility  Bed Mobility Bed Mobility: Supine to Sit;Sitting - Scoot to Edge of Bed;Sit to Supine Supine to Sit: 5: Supervision;With rails;HOB flat Sitting - Scoot to Edge of Bed: 5: Supervision Sit to Supine: 5: Supervision;HOB flat Details for Bed Mobility Assistance: Supervision for safety.  Uses RUE to assist with moving to sitting Transfers Transfers: Sit to Stand;Stand to Sit Sit to Stand: 4: Min assist;With upper extremity assist;From bed Stand to Sit: 4: Min guard;With upper extremity assist;To bed Details for Transfer Assistance: Verbal cues for hand placement.  Assist for balance coming to  standing. Ambulation/Gait Ambulation/Gait Assistance: 4: Min assist Ambulation Distance (Feet): 86 Feet Assistive device: Rolling walker Ambulation/Gait Assistance Details: Verbal cues to stay close to RW and to stand upright.  Patient with flexed posture.  Gait ataxic with decreased balance, especially in turns. Gait Pattern: Step-through pattern;Decreased stride length;Ataxic;Trunk flexed;Decreased trunk rotation Gait velocity: Slow gait speed    Exercises     PT Diagnosis: Abnormality of gait;Difficulty walking;Generalized weakness;Acute pain  PT Problem List: Decreased strength;Decreased range of motion;Decreased activity tolerance;Decreased balance;Decreased mobility;Decreased knowledge of use of DME;Pain PT Treatment Interventions: DME instruction;Gait training;Functional mobility training;Balance training;Patient/family education     PT Goals(Current goals can be found in the care plan section) Acute Rehab PT Goals Patient Stated Goal: To have the drainage in my throat looked at. PT Goal Formulation: With patient Time For Goal Achievement: 06/25/13 Potential to Achieve Goals: Fair  Visit Information  Last PT Received On: 06/11/13 Assistance Needed: +1 History of Present Illness: Patient is a 78 yo male admitted following syncope and fall.  Patient has had multiple falls at home, one in September with dislocated left shoulder.       Prior Functioning  Home Living Family/patient expects to be discharged to:: Skilled nursing facility Living Arrangements: Alone Available Help at Discharge: Family;Friend(s);Available PRN/intermittently Type of Home: House Home Access: Stairs to enter [employer] of Steps: 2 Entrance Stairs-Rails: None Home Layout: One level Home Equipment: Walker - 4 wheels;Tub bench (High toilets) Prior Function Level of Independence: Independent with assistive device(s);Needs assistance Communication Communication: Expressive difficulties  (Dysarthria) Dominant Hand: Right    Cognition  Cognition Arousal/Alertness: Awake/alert Behavior During Therapy: [facility] for  tasks assessed/performed Overall Cognitive Status: No family/caregiver present to determine baseline cognitive functioning    Extremity/Trunk Assessment Upper Extremity Assessment Upper Extremity Assessment: LUE deficits/detail;Generalized weakness (RUI weakness from CVA) LUE Deficits / Details: Dislocated shoulder in September.  Shoulder flexion strength 2/5.  Able to flex/extend elbow at 3/5 LUE: Unable to fully assess due to pain Lower Extremity Assessment Lower Extremity Assessment: Generalized weakness Cervical / Trunk Assessment Cervical / Trunk Assessment: Normal   Balance Balance Balance Assessed: Yes High Level Balance High Level Balance Activites: Turns;Head turns High Level Balance Comments: Patient with decreased balance with high level balance activities with RW.  Patient with decreased cervical movement during gait - guarded.  End of Session PT - End of Session Equipment Utilized During Treatment: Gait belt Activity Tolerance: Patient limited by lethargy Patient left: in bed;with call bell/phone within reach Nurse Communication: Mobility status  GP Functional Assessment Tool Used: Clinical judgement Functional Limitation: Mobility: Walking and moving around Mobility: Walking and Moving Around Current Status ([id number]): At least 1 percent but less than 20 percent impaired, limited or restricted Mobility: Walking and Moving Around Goal Status [phone]): At least 1 percent but less than 20 percent impaired, limited or restricted   Vena Austria 06/11/2013, 3:35 PM Durenda Hurt. Renaldo Fiddler, [facility] Acute Rehab Services Pager [phone]

## 2013-06-11 NOTE — ED Notes (Signed)
Admitting md at bedside to eval pt 

## 2013-06-11 NOTE — Progress Notes (Signed)
Echocardiogram 2D Echocardiogram has been performed.  Bill Jackson 06/11/2013, 3:43 PM

## 2013-06-11 NOTE — ED Notes (Signed)
Patient transported to CT 

## 2013-06-11 NOTE — H&P (Signed)
[facility] History and Physical   @LOGO @  VINCE AINSLEY WUJ:[id number] DOB: April 18, 1935 DOA: 06/11/2013  Referring physician: Dr. Lavella Lemons PCP: Pcp Not In System  Specialists: none  Chief Complaint: syncope and collapse  HPI: Bill Jackson is a 78 y.o. 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 [name], transferred to CIR then rehab and now living at home for the past 4 months, with residual dizziness, slurred speech, right sided weakness, DM, HTN, presents with syncope and collapse overnight. He was feeling at baseline and does not recall what happened but woke up on the floor. He is not sure how long it passed but thinks about 2 hours. He called EMS, was brought into the ED where he had a left forehead laceration repair and hospitalist service was asked for admission. He denies ay chest pain, SOB, no more lightheadedness or dizziness than normal, denies any fever/chills, cough, dysuria. He denies palpitations. He has been feeling overall weaker for the past month. He has slurred speech and has weakness but appreciates that he is at baseline without new deficits.   Review of Systems: as per HPI otherwise negative  Past Medical History  Diagnosis Date  . Hypertension   . Diabetes mellitus   . Stroke    Past Surgical History  Procedure Laterality Date  . Heart stents  x3  . Cardiac surgery    . Hernia repair    . Rotator cuff repair    . Craniectomy N/A 10/25/2012    Procedure: Suboccipital Craniectomy for Evacuation of Cerebellar Hematoma;  Surgeon: Maeola Harman, MD;  Location: [facility];  Service: Neurosurgery;  Laterality: N/A;  Suboccipital Craniectomy for Evacuation of Cerebellar Hematoma   Social History:  reports that he quit smoking about 9 months ago. He does not have any smokeless tobacco  history on file. He reports that he drinks alcohol. He reports that he does not use illicit drugs.  Allergies  Allergen Reactions  . Lotensin [Benazepril Hcl]     cough  . Penicillins Other (See Comments)    pt becomes "nervous"    History reviewed. No pertinent family history.   Prior to Admission medications   Medication Sig Start Date End Date Taking? Authorizing Provider  brimonidine (ALPHAGAN) 0.2 % ophthalmic solution Place 1 drop into both eyes every 8 (eight) hours. 01/19/13  Yes Lucrezia Starch, NP  dorzolamide-timolol (COSOPT) 22.3-6.8 MG/ML ophthalmic solution Place 1 drop into the right eye 2 (two) times daily. 01/19/13  Yes Lucrezia Starch, NP  folic acid (FOLVITE) 1 MG tablet Take 1 tablet (1 mg total) by mouth daily. 01/19/13  Yes Lucrezia Starch, NP  glipiZIDE (GLUCOTROL) 10 MG tablet Take 1 tablet (10 mg total) by mouth 2 (two) times daily before a meal. 01/19/13  Yes Lucrezia Starch, NP  hydrochlorothiazide (HYDRODIURIL) 25 MG tablet Take 1 tablet (25 mg total) by mouth daily. 01/19/13  Yes Lucrezia Starch, NP  HYDROcodone-acetaminophen (NORCO) 5-325 MG per tablet Take 1 tablet by mouth every 4 (four) hours as needed for pain. 04/26/13  Yes Loren Racer, MD  insulin aspart protamine- aspart (NOVOLOG MIX 70/30) (70-30) 100 UNIT/ML injection Inject 0.1 mLs (10 Units total) into the skin 2 (two) times daily with a meal. 01/19/13  Yes Lucrezia Starch, NP  latanoprost (XALATAN) 0.005 % ophthalmic solution Place 1 drop into both eyes  at bedtime. 01/19/13  Yes Lucrezia Starch, NP  losartan (COZAAR) 100 MG tablet Take 50 mg by mouth daily.   Yes Historical Provider, MD  meclizine (ANTIVERT) 25 MG tablet Take 1 tablet (25 mg total) by mouth 2 (two) times daily. 01/19/13  Yes Lucrezia Starch, NP  Multiple Vitamin (MULTIVITAMIN WITH MINERALS) TABS Take 1 tablet by mouth daily. 01/19/13  Yes Lucrezia Starch, NP  pantoprazole (PROTONIX) 40 MG tablet Take 1 tablet (40 mg total) by mouth daily. 01/19/13  Yes Lucrezia Starch,  NP  potassium chloride (K-DUR,KLOR-CON) 10 MEQ tablet Take 1 tablet (10 mEq total) by mouth daily. 01/19/13  Yes Lucrezia Starch, NP  thiamine 100 MG tablet Take 1 tablet (100 mg total) by mouth daily. 01/19/13  Yes Lucrezia Starch, NP  Insulin Syringes, Disposable, U-100 0.3 ML MISC 10 Units by Does not apply route 2 (two) times daily. 01/19/13   Lucrezia Starch, NP   Physical Exam: Filed Vitals:   06/11/13 0318  BP: 113/61  Pulse: 74  Temp: 98.9 F (37.2 C)  TempSrc: Oral  Resp: 18  SpO2: 96%     General:  No apparent distress, slurred speech  Eyes: PERRL, EOMI, no scleral icterus  ENT: moist oropharynx  Neck: supple, no JVD  Cardiovascular: regular rate without MRG; 2+ peripheral pulses  Respiratory: CTA biL, good air movement without wheezing, rhonchi or crackled  Abdomen: soft, non tender to palpation, positive bowel sounds, no guarding, no rebound  Skin: no rashes  Musculoskeletal: no peripheral edema  Psychiatric: normal mood and affect  Neurologic: dysartric, 5-/5 R sided weakness, ataxic.  Labs on Admission:  Basic Metabolic Panel:  Recent Labs Lab 06/11/13 0400  NA 141  K 4.0  CL 104  CO2 26  GLUCOSE 70  BUN 18  CREATININE 1.36*  CALCIUM 9.6   CBC:  Recent Labs Lab 06/11/13 0400  WBC 6.5  NEUTROABS 4.8  HGB 12.8*  HCT 37.2*  MCV 89.2  PLT 328   CBG:  Recent Labs Lab 06/11/13 0447 06/11/13 0510 06/11/13 0547  GLUCAP 59* 61* 121*    Radiological Exams on Admission: Dg Chest 2 View  06/11/2013   CLINICAL DATA:  Status post fall.  Concern for chest injury.  EXAM: CHEST  2 VIEW  COMPARISON:  Chest radiograph performed 04/26/2013  FINDINGS: The lungs are well-aerated and clear. There is no evidence of focal opacification, pleural effusion or pneumothorax.  The heart is normal in size; the mediastinal contour is within normal limits. No acute osseous abnormalities are seen.  IMPRESSION: No acute cardiopulmonary process seen; no displaced rib  fractures identified.   Electronically Signed   By: Roanna Raider M.D.   On: 06/11/2013 05:21   Ct Head Wo Contrast  06/11/2013   CLINICAL DATA:  Fall  EXAM: CT HEAD WITHOUT CONTRAST  CT CERVICAL SPINE WITHOUT CONTRAST  TECHNIQUE: Multidetector CT imaging of the head and cervical spine was performed following the standard protocol without intravenous contrast. Multiplanar CT image reconstructions of the cervical spine were also generated.  COMPARISON:  Prior CTs from 06/01/2013 and 11/22/2012.  FINDINGS: CT HEAD FINDINGS  Postoperative changes from prior suboccipital craniotomy seen. Hypodensity with encephalomalacia and gliosis within the right cerebellar hemisphere is stable. Atrophy with chronic microvascular ischemic disease is also unchanged. No acute intracranial hemorrhage or infarct. No mass or midline shift. There is no extra-axial fluid collection. No skull fracture. Paranasal sinuses and mastoid air cells are clear.  Small laceration is noted at the left  forehead.  CT CERVICAL SPINE FINDINGS  Dextroscoliosis of the cervical spine with associated multilevel degenerative disc disease is present. Vertebral body heights are preserved. There is no listhesis. Normal C1-2 articulations are intact. No acute fracture or this space.  Visualized soft tissues of the neck are unremarkable. Atherosclerotic calcifications are noted about the carotid bifurcations, right greater than left. Visualized lung apices are clear.  IMPRESSION: CT BRAIN:  Stable appearance of the brain with no acute intracranial process identified.  CT CERVI CAL SPINE:  No acute traumatic injury within the cervical spine.   Electronically Signed   By: Rise Mu M.D.   On: 06/11/2013 05:01   Ct Cervical Spine Wo Contrast  06/11/2013   CLINICAL DATA:  Fall  EXAM: CT HEAD WITHOUT CONTRAST  CT CERVICAL SPINE WITHOUT CONTRAST  TECHNIQUE: Multidetector CT imaging of the head and cervical spine was performed following the standard  protocol without intravenous contrast. Multiplanar CT image reconstructions of the cervical spine were also generated.  COMPARISON:  Prior CTs from 06/01/2013 and 11/22/2012.  FINDINGS: CT HEAD FINDINGS  Postoperative changes from prior suboccipital craniotomy seen. Hypodensity with encephalomalacia and gliosis within the right cerebellar hemisphere is stable. Atrophy with chronic microvascular ischemic disease is also unchanged. No acute intracranial hemorrhage or infarct. No mass or midline shift. There is no extra-axial fluid collection. No skull fracture. Paranasal sinuses and mastoid air cells are clear.  Small laceration is noted at the left forehead.  CT CERVICAL SPINE FINDINGS  Dextroscoliosis of the cervical spine with associated multilevel degenerative disc disease is present. Vertebral body heights are preserved. There is no listhesis. Normal C1-2 articulations are intact. No acute fracture or this space.  Visualized soft tissues of the neck are unremarkable. Atherosclerotic calcifications are noted about the carotid bifurcations, right greater than left. Visualized lung apices are clear.  IMPRESSION: CT BRAIN:  Stable appearance of the brain with no acute intracranial process identified.  CT CERVI CAL SPINE:  No acute traumatic injury within the cervical spine.   Electronically Signed   By: Rise Mu M.D.   On: 06/11/2013 05:01    EKG: Independently reviewed.  Assessment/Plan Principal Problem:   Syncope Active Problems:   Type 2 diabetes mellitus   Hypertension   Right shoulder pain   Right sided weakness   Syncope - on baseline dizziness, may be due to hypoglycemia as noted in the ED.  - admit to telemetry, cycle CE. 2D echo, EEG. CT scan negative. Did have hypoglycemia in the ED noted to 59. - Neuro consult given gradual worsening of his dizziness and weakness over the past month.  DM - he is on 10 U BID of 70/30. Occasional hypoglycemic episodes with weakness but feels  different than last night. Start SSI only Hemorrhagic CVA, in remission - neuro exam appears at baseline but very slurred. Q4 neuro checks at least for today and low threshold to re-image. SLP consulted.  HTN - restart his home medications. Shoulder pain due to recent disclocation - s/p fall without syncope 3 weeks ago. Analgesia as needed.   Diet: dysphagia, SLP evaluation pending Fluids: NS DVT Prophylaxis: SCDs  Code Status: Full  Family Communication: none  Disposition Plan: obseration  Time spent: 37  Janecia Palau M. Elvera Lennox, MD [facility] Pager [phone]  If 7PM-7AM, please contact night-coverage [email or web address] Password [email or web address] 06/11/2013, 7:45 AM

## 2013-06-11 NOTE — Consult Note (Signed)
NEURO HOSPITALIST CONSULT NOTE    Reason for Consult: syncope versus seizure, gradual worsening of dizziness and weakness over the past month.    HPI:                                                                                                                                          Bill Jackson is an 78 y.o. male with a past medical history significant for HTN, DM, CAD s/p stenting, cerebellar hemorrhage s/p emergent suboccipital craniectomy for hematoma evacuation, admitted to [facility] for further evaluation of a syncopal episode and worsening dizziness with generalized weakness. He said that this is the second episode of " passing out'. Mr. Qu indicated that he recalls trying to get up from bed last night but when he tried to walk his legs and arm were so weak that he had to go back to bed, and then while in bed he " completely passed out" and was out for approximately 2 hours He tells me that he was home alone and is unsure what happened during the entire episode, but when he regained consciousness he was confused and disoriented. No bladder or bowel impairment. He called EMS and was brought to [facility] ED where he had a CT brain that showed no abnormality. On the other hand, he said that he is noticing progressive generalized weakness and perhaps some worsening of his dizziness but denies vertigo, worsening speech, or double vision. He is able to ambulate with a walker.   Past Medical History  Diagnosis Date  . Hypertension   . Diabetes mellitus   . Stroke     Past Surgical History  Procedure Laterality Date  . Heart stents  x3  . Cardiac surgery    . Hernia repair    . Rotator cuff repair    . Craniectomy N/A 10/25/2012    Procedure: Suboccipital Craniectomy for Evacuation of Cerebellar Hematoma;  Surgeon: Maeola Harman, MD;  Location: [facility] NEURO ORS;  Service: Neurosurgery;  Laterality: N/A;  Suboccipital Craniectomy for Evacuation of Cerebellar Hematoma     History reviewed. No pertinent family history.   Social History:  reports that he quit smoking about 2 months ago. He does not have any smokeless tobacco history on file. He reports that he does not drink alcohol or use illicit drugs.  Allergies  Allergen Reactions  . Lotensin [Benazepril Hcl]     cough  . Penicillins Other (See Comments)    pt becomes "nervous"    MEDICATIONS:  I have reviewed the patient's current medications.   ROS:                                                                                                                                       History obtained from the patient and chart review.  General ROS: negative for - chills, fever, night sweats, weight gain or weight loss Psychological ROS: negative for - behavioral disorder, hallucinations, memory difficulties, mood swings or suicidal ideation Ophthalmic ROS: negative for - blurry vision, double vision, eye pain or loss of vision ENT ROS: negative for - epistaxis, nasal discharge, oral lesions, sore throat, tinnitus or vertigo Allergy and Immunology ROS: negative for - hives or itchy/watery eyes Hematological and Lymphatic ROS: negative for - bleeding problems, bruising or swollen lymph nodes Endocrine ROS: negative for - galactorrhea, hair pattern changes, polydipsia/polyuria or temperature intolerance Respiratory ROS: negative for - cough, hemoptysis, shortness of breath or wheezing Cardiovascular ROS: negative for - chest pain, dyspnea on exertion, edema or irregular heartbeat Gastrointestinal ROS: negative for - abdominal pain, diarrhea, hematemesis, nausea/vomiting or stool incontinence Genito-Urinary ROS: negative for - dysuria, hematuria, incontinence or urinary frequency/urgency Musculoskeletal ROS: negative for - joint swelling or muscular weakness Neurological ROS: as noted  in HPI Dermatological ROS: negative for rash and skin lesion changes   Physical exam: pleasant male in no apparent distress. Head: normocephalic. Neck: supple, no bruits, no JVD. Cardiac: no murmurs. Lungs: clear. Abdomen: soft, no tender, no mass. Extremities: no edema.  Blood pressure 92/45, pulse 60, temperature 99.6 F (37.6 C), temperature source Oral, resp. rate 18, height 6' (1.829 m), weight 72.6 kg (160 lb 0.9 oz), SpO2 99.00%.   Neurologic Examination:                                                                                                      Mental Status: Alert, oriented, thought content appropriate.  Dysarthric.  Able to follow 3 step commands without difficulty. Cranial Nerves: II: Discs flat bilaterally; Visual fields grossly normal, pupils equal, round, reactive to light and accommodation III,IV, VI: ptosis not present, extra-ocular motions intact bilaterally V,VII: smile symmetric, facial light touch sensation normal bilaterally VIII: hearing normal bilaterally IX,X: gag reflex present XI: bilateral shoulder shrug XII: midline tongue extension without atrophy or fasciculations  Motor: Moves the right side well as well as the right LE but rotator cuff surgery prevents full range of motion left shoulder. Tone and bulk:normal tone throughout; no atrophy noted Sensory:  Pinprick and light touch intact throughout, bilaterally Deep Tendon Reflexes:  Right: Upper Extremity   Left: Upper extremity   biceps (C-5 to C-6) 2/4   biceps (C-5 to C-6) 2/4 tricep (C7) 2/4    triceps (C7) 2/4 Brachioradialis (C6) 2/4  Brachioradialis (C6) 2/4  Lower Extremity Lower Extremity  quadriceps (L-2 to L-4) 2/4   quadriceps (L-2 to L-4) 2/4 Achilles (S1) 2/4   Achilles (S1) 2/4  Plantars: Right: downgoing   Left: downgoing Cerebellar: Unable to test in the left arm but seems to be ok in the right side. Gait:  ataxic CV: pulses palpable throughout    No results  found for this basename: cbc, bmp, coags, chol, tri, ldl, hga1c    Results for orders placed during the hospital encounter of 06/11/13 (from the past 48 hour(s))  BASIC METABOLIC PANEL     Status: Abnormal   Collection Time    06/11/13  4:00 AM      Result Value Range   Sodium 141  135 - 145 mEq/L   Potassium 4.0  3.5 - 5.1 mEq/L   Chloride 104  96 - 112 mEq/L   CO2 26  19 - 32 mEq/L   Glucose, Bld 70  70 - 99 mg/dL   BUN 18  6 - 23 mg/dL   Creatinine, Ser 1.61 (*) 0.50 - 1.35 mg/dL   Calcium 9.6  8.4 - 09.6 mg/dL   GFR calc non Af Amer 49 (*) >90 mL/min   GFR calc Af Amer 57 (*) >90 mL/min   Comment: (NOTE)     The eGFR has been calculated using the CKD EPI equation.     This calculation has not been validated in all clinical situations.     eGFR's persistently <90 mL/min signify possible Chronic Kidney     Disease.  CBC WITH DIFFERENTIAL     Status: Abnormal   Collection Time    06/11/13  4:00 AM      Result Value Range   WBC 6.5  4.0 - 10.5 K/uL   RBC 4.17 (*) 4.22 - 5.81 MIL/uL   Hemoglobin 12.8 (*) 13.0 - 17.0 g/dL   HCT 04.5 (*) 40.9 - 81.1 %   MCV 89.2  78.0 - 100.0 fL   MCH 30.7  26.0 - 34.0 pg   MCHC 34.4  30.0 - 36.0 g/dL   RDW 91.4  78.2 - 95.6 %   Platelets 328  150 - 400 K/uL   Neutrophils Relative % 74  43 - 77 %   Neutro Abs 4.8  1.7 - 7.7 K/uL   Lymphocytes Relative 21  12 - 46 %   Lymphs Abs 1.4  0.7 - 4.0 K/uL   Monocytes Relative 5  3 - 12 %   Monocytes Absolute 0.3  0.1 - 1.0 K/uL   Eosinophils Relative 0  0 - 5 %   Eosinophils Absolute 0.0  0.0 - 0.7 K/uL   Basophils Relative 0  0 - 1 %   Basophils Absolute 0.0  0.0 - 0.1 K/uL  ETHANOL     Status: None   Collection Time    06/11/13  4:00 AM      Result Value Range   Alcohol, Ethyl (B) <11  0 - 11 mg/dL   Comment:            LOWEST DETECTABLE LIMIT FOR     SERUM ALCOHOL IS 11 mg/dL     FOR MEDICAL PURPOSES ONLY  URINALYSIS, ROUTINE W REFLEX MICROSCOPIC     Status: None   Collection Time     06/11/13  4:03 AM      Result Value Range   Color, Urine YELLOW  YELLOW   APPearance CLEAR  CLEAR   Specific Gravity, Urine 1.005  1.005 - 1.030   pH 5.5  5.0 - 8.0   Glucose, UA NEGATIVE  NEGATIVE mg/dL   Hgb urine dipstick NEGATIVE  NEGATIVE   Bilirubin Urine NEGATIVE  NEGATIVE   Ketones, ur NEGATIVE  NEGATIVE mg/dL   Protein, ur NEGATIVE  NEGATIVE mg/dL   Urobilinogen, UA 0.2  0.0 - 1.0 mg/dL   Nitrite NEGATIVE  NEGATIVE   Leukocytes, UA NEGATIVE  NEGATIVE   Comment: MICROSCOPIC NOT DONE ON URINES WITH NEGATIVE PROTEIN, BLOOD, LEUKOCYTES, NITRITE, OR GLUCOSE <1000 mg/dL.  POCT I-STAT TROPONIN I     Status: None   Collection Time    06/11/13  4:10 AM      Result Value Range   Troponin i, poc 0.00  0.00 - 0.08 ng/mL   Comment 3            Comment: Due to the release kinetics of cTnI,     a negative result within the first hours     of the onset of symptoms does not rule out     myocardial infarction with certainty.     If myocardial infarction is still suspected,     repeat the test at appropriate intervals.  GLUCOSE, CAPILLARY     Status: Abnormal   Collection Time    06/11/13  4:47 AM      Result Value Range   Glucose-Capillary 59 (*) 70 - 99 mg/dL  GLUCOSE, CAPILLARY     Status: Abnormal   Collection Time    06/11/13  5:10 AM      Result Value Range   Glucose-Capillary 61 (*) 70 - 99 mg/dL  GLUCOSE, CAPILLARY     Status: Abnormal   Collection Time    06/11/13  5:47 AM      Result Value Range   Glucose-Capillary 121 (*) 70 - 99 mg/dL  TROPONIN I     Status: None   Collection Time    06/11/13  7:54 AM      Result Value Range   Troponin I <0.30  <0.30 ng/mL   Comment:            Due to the release kinetics of cTnI,     a negative result within the first hours     of the onset of symptoms does not rule out     myocardial infarction with certainty.     If myocardial infarction is still suspected,     repeat the test at appropriate intervals.  GLUCOSE, CAPILLARY      Status: Abnormal   Collection Time    06/11/13  8:23 AM      Result Value Range   Glucose-Capillary 230 (*) 70 - 99 mg/dL   Comment 1 Notify RN    GLUCOSE, CAPILLARY     Status: Abnormal   Collection Time    06/11/13 11:34 AM      Result Value Range   Glucose-Capillary 184 (*) 70 - 99 mg/dL   Comment 1 Notify RN    TROPONIN I     Status: None   Collection Time    06/11/13  2:14 PM      Result Value Range   Troponin I <  0.30  <0.30 ng/mL   Comment:            Due to the release kinetics of cTnI,     a negative result within the first hours     of the onset of symptoms does not rule out     myocardial infarction with certainty.     If myocardial infarction is still suspected,     repeat the test at appropriate intervals.  GLUCOSE, CAPILLARY     Status: Abnormal   Collection Time    06/11/13  4:30 PM      Result Value Range   Glucose-Capillary 193 (*) 70 - 99 mg/dL   Comment 1 Notify RN      Dg Chest 2 View  06/11/2013   CLINICAL DATA:  Status post fall.  Concern for chest injury.  EXAM: CHEST  2 VIEW  COMPARISON:  Chest radiograph performed 04/26/2013  FINDINGS: The lungs are well-aerated and clear. There is no evidence of focal opacification, pleural effusion or pneumothorax.  The heart is normal in size; the mediastinal contour is within normal limits. No acute osseous abnormalities are seen.  IMPRESSION: No acute cardiopulmonary process seen; no displaced rib fractures identified.   Electronically Signed   By: Roanna Raider M.D.   On: 06/11/2013 05:21   Ct Head Wo Contrast  06/11/2013   CLINICAL DATA:  Fall  EXAM: CT HEAD WITHOUT CONTRAST  CT CERVICAL SPINE WITHOUT CONTRAST  TECHNIQUE: Multidetector CT imaging of the head and cervical spine was performed following the standard protocol without intravenous contrast. Multiplanar CT image reconstructions of the cervical spine were also generated.  COMPARISON:  Prior CTs from 06/01/2013 and 11/22/2012.  FINDINGS: CT HEAD FINDINGS   Postoperative changes from prior suboccipital craniotomy seen. Hypodensity with encephalomalacia and gliosis within the right cerebellar hemisphere is stable. Atrophy with chronic microvascular ischemic disease is also unchanged. No acute intracranial hemorrhage or infarct. No mass or midline shift. There is no extra-axial fluid collection. No skull fracture. Paranasal sinuses and mastoid air cells are clear.  Small laceration is noted at the left forehead.  CT CERVICAL SPINE FINDINGS  Dextroscoliosis of the cervical spine with associated multilevel degenerative disc disease is present. Vertebral body heights are preserved. There is no listhesis. Normal C1-2 articulations are intact. No acute fracture or this space.  Visualized soft tissues of the neck are unremarkable. Atherosclerotic calcifications are noted about the carotid bifurcations, right greater than left. Visualized lung apices are clear.  IMPRESSION: CT BRAIN:  Stable appearance of the brain with no acute intracranial process identified.  CT CERVI CAL SPINE:  No acute traumatic injury within the cervical spine.   Electronically Signed   By: Rise Mu M.D.   On: 06/11/2013 05:01   Ct Cervical Spine Wo Contrast  06/11/2013   CLINICAL DATA:  Fall  EXAM: CT HEAD WITHOUT CONTRAST  CT CERVICAL SPINE WITHOUT CONTRAST  TECHNIQUE: Multidetector CT imaging of the head and cervical spine was performed following the standard protocol without intravenous contrast. Multiplanar CT image reconstructions of the cervical spine were also generated.  COMPARISON:  Prior CTs from 06/01/2013 and 11/22/2012.  FINDINGS: CT HEAD FINDINGS  Postoperative changes from prior suboccipital craniotomy seen. Hypodensity with encephalomalacia and gliosis within the right cerebellar hemisphere is stable. Atrophy with chronic microvascular ischemic disease is also unchanged. No acute intracranial hemorrhage or infarct. No mass or midline shift. There is no extra-axial fluid  collection. No skull fracture. Paranasal sinuses and mastoid air  cells are clear.  Small laceration is noted at the left forehead.  CT CERVICAL SPINE FINDINGS  Dextroscoliosis of the cervical spine with associated multilevel degenerative disc disease is present. Vertebral body heights are preserved. There is no listhesis. Normal C1-2 articulations are intact. No acute fracture or this space.  Visualized soft tissues of the neck are unremarkable. Atherosclerotic calcifications are noted about the carotid bifurcations, right greater than left. Visualized lung apices are clear.  IMPRESSION: CT BRAIN:  Stable appearance of the brain with no acute intracranial process identified.  CT CERVI CAL SPINE:  No acute traumatic injury within the cervical spine.   Electronically Signed   By: Rise Mu M.D.   On: 06/11/2013 05:01     [facility] [phone]  06/11/2013, 6:44 PM   Assessment/Plan: Syncope versus seizure: the period of unresponsiveness was too prolonged to be consistent with a seizure or syncope. TIA likely as he has multiple risk factors for stroke. EEG already requested. MRI brain. Will follow up   Wyatt Portela, MD [facility] [phone]  06/11/2013, 6:44 P

## 2013-06-11 NOTE — ED Provider Notes (Signed)
LACERATION REPAIR Date/Time: 06/11/2013 6:54 AM Performed by: Coral Ceo K Authorized by: Jillyn Ledger Consent: Verbal consent obtained. Risks and benefits: risks, benefits and alternatives were discussed Consent given by: patient Patient identity confirmed: verbally with patient Body area: head/neck Location details: scalp Laceration length: 3 cm Foreign bodies: no foreign bodies Tendon involvement: none Nerve involvement: none Vascular damage: no Anesthesia: local infiltration Local anesthetic: lidocaine 2% without epinephrine Anesthetic total: 4 ml Patient sedated: no Preparation: Patient was prepped and draped in the usual sterile fashion. Irrigation solution: saline Irrigation method: syringe Amount of cleaning: standard Debridement: none Degree of undermining: none Skin closure: Ethilon Number of sutures: 4 Technique: simple Approximation: close Approximation difficulty: simple Dressing: antibiotic ointment and 4x4 sterile gauze Patient tolerance: Patient tolerated the procedure well with no immediate complications.   Brenten Janney PA-C    Jillyn Ledger, [place] 06/11/13 [phone]

## 2013-06-11 NOTE — ED Notes (Signed)
EMS-pt states he fell out of bed, denies LOC. Pt with laceration, approx 1.5in above left eyebrow, well approximate with small amount of bleeding still at this time. PERL. CBG 79. Pt alert and oriented. Pt with hx of stroke with residual speech impediment. Pt with left shoulder in sling from falling approx over a week a ago, states shoulder was "not broken just out of place."

## 2013-06-11 NOTE — ED Provider Notes (Signed)
CSN: [id number]     Arrival date & time 06/11/13  0309 History   First MD Initiated Contact with Patient 06/11/13 0340     Chief Complaint  Patient presents with  . Fall   (Consider location/radiation/quality/duration/timing/severity/associated sxs/prior Treatment) HPI Bill Jackson is a 78 yo man with a history of recent CVA, HTN, DM. He lives alone and presents via EMS after an episode of syncope. He awoke on the floor of his bedroom just PTA but does not recall how he got there. He does not recall fall. However, he has a laceration to his left forehead suggesting blunt trauma to the head.   He was able to get back into his bed and then used his walker to walk to the front door to open it for paramedics. He called 911. He has a sensation of being "very, very, very weak" diffusely. He has chronic dysarthria and left hemiparesis as the result of a previous CVA but, denies any new motor deficits or weakness.   Pt denies any chest pain, SOB, abdominal pain, vomiting, diarrhea, blood stools or medication changes. He says he seen in the ED three weeks ago after a similar event which resulted in a shoulder injury.   Past Medical History  Diagnosis Date  . Hypertension   . Diabetes mellitus   . Stroke    Past Surgical History  Procedure Laterality Date  . Heart stents  x3  . Cardiac surgery    . Hernia repair    . Rotator cuff repair    . Craniectomy N/A 10/25/2012    Procedure: Suboccipital Craniectomy for Evacuation of Cerebellar Hematoma;  Surgeon: Maeola Harman, MD;  Location: [facility];  Service: Neurosurgery;  Laterality: N/A;  Suboccipital Craniectomy for Evacuation of Cerebellar Hematoma   No family history on file. History  Substance Use Topics  . Smoking status: Former Smoker    Quit date: 09/01/2012  . Smokeless tobacco: Not on file  . Alcohol Use: Yes    Review of Systems 10 point ROS obtained and is negative with the exception of sx noted above.  Allergies  Lotensin and  Penicillins  Home Medications   Current Outpatient Rx  Name  Route  Sig  Dispense  Refill  . brimonidine (ALPHAGAN) 0.2 % ophthalmic solution   Both Eyes   Place 1 drop into both eyes every 8 (eight) hours.   5 mL   0   . dorzolamide-timolol (COSOPT) 22.3-6.8 MG/ML ophthalmic solution   Right Eye   Place 1 drop into the right eye 2 (two) times daily.   10 mL   0   . folic acid (FOLVITE) 1 MG tablet   Oral   Take 1 tablet (1 mg total) by mouth daily.   30 tablet   0   . glipiZIDE (GLUCOTROL) 10 MG tablet   Oral   Take 1 tablet (10 mg total) by mouth 2 (two) times daily before a meal.   60 tablet   0   . hydrochlorothiazide (HYDRODIURIL) 25 MG tablet   Oral   Take 1 tablet (25 mg total) by mouth daily.   30 tablet   0   . HYDROcodone-acetaminophen (NORCO) 5-325 MG per tablet   Oral   Take 1 tablet by mouth every 4 (four) hours as needed for pain.   20 tablet   0   . insulin aspart protamine- aspart (NOVOLOG MIX 70/30) (70-30) 100 UNIT/ML injection   Subcutaneous   Inject 0.1 mLs (10  Units total) into the skin 2 (two) times daily with a meal.   10 mL   0   . latanoprost (XALATAN) 0.005 % ophthalmic solution   Both Eyes   Place 1 drop into both eyes at bedtime.   2.5 mL   0   . losartan (COZAAR) 100 MG tablet   Oral   Take 50 mg by mouth daily.         . meclizine (ANTIVERT) 25 MG tablet   Oral   Take 1 tablet (25 mg total) by mouth 2 (two) times daily.   30 tablet   0   . Multiple Vitamin (MULTIVITAMIN WITH MINERALS) TABS   Oral   Take 1 tablet by mouth daily.         . pantoprazole (PROTONIX) 40 MG tablet   Oral   Take 1 tablet (40 mg total) by mouth daily.   30 tablet   0   . potassium chloride (K-DUR,KLOR-CON) 10 MEQ tablet   Oral   Take 1 tablet (10 mEq total) by mouth daily.   30 tablet   0   . thiamine 100 MG tablet   Oral   Take 1 tablet (100 mg total) by mouth daily.   30 tablet   0   . Insulin Syringes, Disposable, U-100  0.3 ML MISC   Does not apply   10 Units by Does not apply route 2 (two) times daily.   60 each   0    BP 113/61  Pulse 74  Temp(Src) 98.9 F (37.2 C) (Oral)  Resp 18  SpO2 96% Physical Exam Gen: well developed and well nourished appearing, no acute distress Head: NCAT, 25mm linear and full thickness laceration left forehead Eyes: PERL, EOMI, conjunctiva mildly injected bilaterally Nose: no epistaixis or rhinorrhea Mouth/throat: mucosa is moist and pink, no signs of intra oral trauma Neck: supple, no stridor, no c spine ttp Lungs: CTA B, no wheezing, rhonchi or rales CV: RRR, no murmur Abd: soft, notender, nondistended Back: no midline  Skin: warm and dry Neuro: mild to moderate dysarthria, otherwise, CN ii-xii grossly intact, left hemiparesis Psyche; normal affect,  calm and cooperative.  Appears to have normal insight  ED Course  Procedures (including critical care time) Labs Review  Results for orders placed during the hospital encounter of 06/11/13 (from the past 24 hour(s))  BASIC METABOLIC PANEL     Status: Abnormal   Collection Time    06/11/13  4:00 AM      Result Value Range   Sodium 141  135 - 145 mEq/L   Potassium 4.0  3.5 - 5.1 mEq/L   Chloride 104  96 - 112 mEq/L   CO2 26  19 - 32 mEq/L   Glucose, Bld 70  70 - 99 mg/dL   BUN 18  6 - 23 mg/dL   Creatinine, Ser 1.19 (*) 0.50 - 1.35 mg/dL   Calcium 9.6  8.4 - 14.7 mg/dL   GFR calc non Af Amer 49 (*) >90 mL/min   GFR calc Af Amer 57 (*) >90 mL/min  CBC WITH DIFFERENTIAL     Status: Abnormal   Collection Time    06/11/13  4:00 AM      Result Value Range   WBC 6.5  4.0 - 10.5 K/uL   RBC 4.17 (*) 4.22 - 5.81 MIL/uL   Hemoglobin 12.8 (*) 13.0 - 17.0 g/dL   HCT 82.9 (*) 56.2 - 13.0 %   MCV 89.2  78.0 - 100.0 fL   MCH 30.7  26.0 - 34.0 pg   MCHC 34.4  30.0 - 36.0 g/dL   RDW 16.1  09.6 - 04.5 %   Platelets 328  150 - 400 K/uL   Neutrophils Relative % 74  43 - 77 %   Neutro Abs 4.8  1.7 - 7.7 K/uL    Lymphocytes Relative 21  12 - 46 %   Lymphs Abs 1.4  0.7 - 4.0 K/uL   Monocytes Relative 5  3 - 12 %   Monocytes Absolute 0.3  0.1 - 1.0 K/uL   Eosinophils Relative 0  0 - 5 %   Eosinophils Absolute 0.0  0.0 - 0.7 K/uL   Basophils Relative 0  0 - 1 %   Basophils Absolute 0.0  0.0 - 0.1 K/uL  ETHANOL     Status: None   Collection Time    06/11/13  4:00 AM      Result Value Range   Alcohol, Ethyl (B) <11  0 - 11 mg/dL  URINALYSIS, ROUTINE W REFLEX MICROSCOPIC     Status: None   Collection Time    06/11/13  4:03 AM      Result Value Range   Color, Urine YELLOW  YELLOW   APPearance CLEAR  CLEAR   Specific Gravity, Urine 1.005  1.005 - 1.030   pH 5.5  5.0 - 8.0   Glucose, UA NEGATIVE  NEGATIVE mg/dL   Hgb urine dipstick NEGATIVE  NEGATIVE   Bilirubin Urine NEGATIVE  NEGATIVE   Ketones, ur NEGATIVE  NEGATIVE mg/dL   Protein, ur NEGATIVE  NEGATIVE mg/dL   Urobilinogen, UA 0.2  0.0 - 1.0 mg/dL   Nitrite NEGATIVE  NEGATIVE   Leukocytes, UA NEGATIVE  NEGATIVE  POCT I-STAT TROPONIN I     Status: None   Collection Time    06/11/13  4:10 AM      Result Value Range   Troponin i, poc 0.00  0.00 - 0.08 ng/mL   Comment 3           GLUCOSE, CAPILLARY     Status: Abnormal   Collection Time    06/11/13  4:47 AM      Result Value Range   Glucose-Capillary 59 (*) 70 - 99 mg/dL  GLUCOSE, CAPILLARY     Status: Abnormal   Collection Time    06/11/13  5:10 AM      Result Value Range   Glucose-Capillary 61 (*) 70 - 99 mg/dL  GLUCOSE, CAPILLARY     Status: Abnormal   Collection Time    06/11/13  5:47 AM      Result Value Range   Glucose-Capillary 121 (*) 70 - 99 mg/dL   CT Head Wo Contrast (Final result)  Result time: 06/11/13 05:01:38    Final result by Rad Results In Interface (06/11/13 05:01:38)    Narrative:   CLINICAL DATA: Fall  EXAM: CT HEAD WITHOUT CONTRAST  CT CERVICAL SPINE WITHOUT CONTRAST  TECHNIQUE: Multidetector CT imaging of the head and cervical spine  was performed following the standard protocol without intravenous contrast. Multiplanar CT image reconstructions of the cervical spine were also generated.  COMPARISON: Prior CTs from 06/01/2013 and 11/22/2012.  FINDINGS: CT HEAD FINDINGS  Postoperative changes from prior suboccipital craniotomy seen. Hypodensity with encephalomalacia and gliosis within the right cerebellar hemisphere is stable. Atrophy with chronic microvascular ischemic disease is also unchanged. No acute intracranial hemorrhage or infarct. No mass or midline shift.  There is no extra-axial fluid collection. No skull fracture. Paranasal sinuses and mastoid air cells are clear.  Small laceration is noted at the left forehead.  CT CERVICAL SPINE FINDINGS  Dextroscoliosis of the cervical spine with associated multilevel degenerative disc disease is present. Vertebral body heights are preserved. There is no listhesis. Normal C1-2 articulations are intact. No acute fracture or this space.  Visualized soft tissues of the neck are unremarkable. Atherosclerotic calcifications are noted about the carotid bifurcations, right greater than left. Visualized lung apices are clear.  IMPRESSION: CT BRAIN:  Stable appearance of the brain with no acute intracranial process identified.  CT CERVI CAL SPINE:  No acute traumatic injury within the cervical spine.   Electronically Signed By: Rise Mu M.D. On: 06/11/2013 05:01             CT Cervical Spine Wo Contrast (Final result)  Result time: 06/11/13 05:01:38    Final result by Rad Results In Interface (06/11/13 05:01:38)    Narrative:   CLINICAL DATA: Fall  EXAM: CT HEAD WITHOUT CONTRAST  CT CERVICAL SPINE WITHOUT CONTRAST  TECHNIQUE: Multidetector CT imaging of the head and cervical spine was performed following the standard protocol without intravenous contrast. Multiplanar CT image reconstructions of the cervical spine were also  generated.  COMPARISON: Prior CTs from 06/01/2013 and 11/22/2012.  FINDINGS: CT HEAD FINDINGS  Postoperative changes from prior suboccipital craniotomy seen. Hypodensity with encephalomalacia and gliosis within the right cerebellar hemisphere is stable. Atrophy with chronic microvascular ischemic disease is also unchanged. No acute intracranial hemorrhage or infarct. No mass or midline shift. There is no extra-axial fluid collection. No skull fracture. Paranasal sinuses and mastoid air cells are clear.  Small laceration is noted at the left forehead.  CT CERVICAL SPINE FINDINGS  Dextroscoliosis of the cervical spine with associated multilevel degenerative disc disease is present. Vertebral body heights are preserved. There is no listhesis. Normal C1-2 articulations are intact. No acute fracture or this space.  Visualized soft tissues of the neck are unremarkable. Atherosclerotic calcifications are noted about the carotid bifurcations, right greater than left. Visualized lung apices are clear.  IMPRESSION: CT BRAIN:  Stable appearance of the brain with no acute intracranial process identified.  CT CERVI CAL SPINE:  No acute traumatic injury within the cervical spine.   Electronically Signed By: Rise Mu M.D. On: 06/11/2013 05:01             DG Chest 2 View (Final result)  Result time: 06/11/13 05:21:40    Final result by Rad Results In Interface (06/11/13 05:21:40)    Narrative:   CLINICAL DATA: Status post fall. Concern for chest injury.  EXAM: CHEST 2 VIEW  COMPARISON: Chest radiograph performed 04/26/2013  FINDINGS: The lungs are well-aerated and clear. There is no evidence of focal opacification, pleural effusion or pneumothorax.  The heart is normal in size; the mediastinal contour is within normal limits. No acute osseous abnormalities are seen.  IMPRESSION: No acute cardiopulmonary process seen; no displaced rib  fractures identified.   Electronically Signed By: Roanna Raider M.D. On: 06/11/2013 05:21      EKG Interpretation   None       MDM   Patient is s/p syncope vs. Fall with amnesia of event. ED work up is non-diagnostic. CT brain and c spine negative for acute findings. EKG unremarkable. Tpn and other labs are non-diagnostic. We are going to repair the patient's facial laceration - see MLP note. Case discussed with Dr. Allena Katz who  has accepted the patient for admission to the Tele unit as observation status. Patient would benefit from SW consult given multiple recent falls, chronic ataxia 2/2 to remote cerebellar hemorrhage and living status.     Brandt Loosen, MD 06/11/13 [phone]

## 2013-06-12 ENCOUNTER — Observation Stay (HOSPITAL_COMMUNITY): Payer: Medicare Other

## 2013-06-12 DIAGNOSIS — I10 Essential (primary) hypertension: Secondary | ICD-10-CM

## 2013-06-12 DIAGNOSIS — E43 Unspecified severe protein-calorie malnutrition: Secondary | ICD-10-CM | POA: Insufficient documentation

## 2013-06-12 DIAGNOSIS — R279 Unspecified lack of coordination: Secondary | ICD-10-CM

## 2013-06-12 DIAGNOSIS — E119 Type 2 diabetes mellitus without complications: Secondary | ICD-10-CM

## 2013-06-12 LAB — BASIC METABOLIC PANEL
CO2: 29 mEq/L (ref 19–32)
Calcium: 9.3 mg/dL (ref 8.4–10.5)
Chloride: 103 mEq/L (ref 96–112)
GFR calc non Af Amer: 49 mL/min — ABNORMAL LOW (ref >90)
Glucose, Bld: 140 mg/dL — ABNORMAL HIGH (ref 70–99)
Potassium: 3.7 mEq/L (ref 3.5–5.1)
Sodium: 141 mEq/L (ref 135–145)

## 2013-06-12 LAB — GLUCOSE, CAPILLARY
Glucose-Capillary: 157 mg/dL — ABNORMAL HIGH (ref 70–99)
Glucose-Capillary: 164 mg/dL — ABNORMAL HIGH (ref 70–99)
Glucose-Capillary: 222 mg/dL — ABNORMAL HIGH (ref 70–99)
Glucose-Capillary: 154 mg/dL — ABNORMAL HIGH (ref 70–99)

## 2013-06-12 LAB — CBC
Hemoglobin: 11.9 g/dL — ABNORMAL LOW (ref 13.0–17.0)
MCH: 30.5 pg (ref 26.0–34.0)
RBC: 3.9 MIL/uL — ABNORMAL LOW (ref 4.22–5.81)
WBC: 4.1 10*3/uL (ref 4.0–10.5)

## 2013-06-12 MED ORDER — ENSURE COMPLETE PO LIQD
237.0000 mL | Freq: Two times a day (BID) | ORAL | Status: DC
  Administered 2013-06-13: 237 mL via ORAL

## 2013-06-12 MED ORDER — LORAZEPAM 2 MG/ML IJ SOLN
INTRAMUSCULAR | Status: AC
  Administered 2013-06-12: 1 mg via INTRAVENOUS
  Filled 2013-06-12: qty 1

## 2013-06-12 MED ORDER — LORAZEPAM 2 MG/ML IJ SOLN: 1.0000 mg | Freq: Once | INTRAMUSCULAR | Status: DC

## 2013-06-12 NOTE — Progress Notes (Addendum)
[facility] PROGRESS NOTE  Bill Jackson ZOX:[id number] DOB: Jul 09, 1935 DOA: 06/11/2013 PCP: Pcp Not In System  Assessment/Plan: 78 y.o. 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 [name], transferred to CIR then rehab and now living at home for the past 4 months, with residual dizziness, slurred speech, right sided weakness presents with syncope and collapse overnight.   1. Syncope vs seizure; r/o TIA: Patient also has risk factors for recurrent TIA;  -pend MRI, EEG, echo, appreciate neuro input; PT eval;   2. HTN; cont home regimen   3. DM HA1C-6.2; cont home regimen   4. Shoulder pain due to recent disclocation - s/p fall without syncope 3 weeks ago. Analgesia as needed  5. 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 [name] - with residual dizziness, slurred speech, right sided weakness  - not on antiplatelets ? due to hemorrhagic CVA; defer to neurology    Code Status: full Family Communication: none at the bedside (indicate person spoken with, relationship, and if by phone, the number) Disposition Plan: home when ready    Consultants:  Neurology   Procedures:  CT, MRI pend   Antibiotics:  None  (indicate start date, and stop date if known)  HPI/Subjective: alert  Objective: Filed Vitals:   06/12/13 0743  BP: 107/69  Pulse: 79  Temp: 98.3 F (36.8 C)  Resp: 20    Intake/Output Summary (Last 24 hours) at 06/12/13 0953 Last data filed at 06/11/13 1820  Gross per 24 hour  Intake   1080 ml  Output    202 ml  Net    878 ml   Filed Weights   06/11/13 0822 06/11/13 0835  Weight: 72.621 kg (160 lb 1.6 oz) 72.6 kg (160 lb 0.9 oz)    Exam:   General:   alert  Cardiovascular: s1,s2 rrr  Respiratory: CTA BL   Abdomen: soft, nt, nd   Musculoskeletal: no LE edema   Data Reviewed: Basic Metabolic Panel:  Recent Labs Lab 06/11/13 0400 06/12/13 0600  NA 141 141  K 4.0 3.7  CL 104 103  CO2 26 29  GLUCOSE 70 140*  BUN 18 18  CREATININE 1.36* 1.37*  CALCIUM 9.6 9.3   Liver Function Tests: No results found for this basename: AST, ALT, ALKPHOS, BILITOT, PROT, ALBUMIN,  in the last 168 hours No results found for this basename: LIPASE, AMYLASE,  in the last 168 hours No results found for this basename: AMMONIA,  in the last 168 hours CBC:  Recent Labs Lab 06/11/13 0400 06/12/13 0600  WBC 6.5 4.1  NEUTROABS 4.8  --   HGB 12.8* 11.9*  HCT 37.2* 35.0*  MCV 89.2 89.7  PLT 328 321   Cardiac Enzymes:  Recent Labs Lab 06/11/13 0754 06/11/13 1414 06/11/13 1930  TROPONINI <0.30 <0.30 <0.30   BNP (last 3 results) No results found for this basename: PROBNP,  in the last 8760 hours CBG:  Recent Labs Lab 06/11/13 0823 06/11/13 1134 06/11/13 1630 06/11/13 2145 06/12/13 0747  GLUCAP 230* 184* 193* 236* 157*    No results found for this or any previous visit (from the past 240 hour(s)).   Studies: Dg Chest 2 View  06/11/2013   CLINICAL DATA:  Status  post fall.  Concern for chest injury.  EXAM: CHEST  2 VIEW  COMPARISON:  Chest radiograph performed 04/26/2013  FINDINGS: The lungs are well-aerated and clear. There is no evidence of focal opacification, pleural effusion or pneumothorax.  The heart is normal in size; the mediastinal contour is within normal limits. No acute osseous abnormalities are seen.  IMPRESSION: No acute cardiopulmonary process seen; no displaced rib fractures identified.   Electronically Signed   By: Roanna Raider M.D.   On: 06/11/2013 05:21   Ct Head Wo Contrast  06/11/2013   CLINICAL DATA:  Fall  EXAM: CT HEAD WITHOUT CONTRAST  CT CERVICAL SPINE WITHOUT CONTRAST  TECHNIQUE: Multidetector CT imaging  of the head and cervical spine was performed following the standard protocol without intravenous contrast. Multiplanar CT image reconstructions of the cervical spine were also generated.  COMPARISON:  Prior CTs from 06/01/2013 and 11/22/2012.  FINDINGS: CT HEAD FINDINGS  Postoperative changes from prior suboccipital craniotomy seen. Hypodensity with encephalomalacia and gliosis within the right cerebellar hemisphere is stable. Atrophy with chronic microvascular ischemic disease is also unchanged. No acute intracranial hemorrhage or infarct. No mass or midline shift. There is no extra-axial fluid collection. No skull fracture. Paranasal sinuses and mastoid air cells are clear.  Small laceration is noted at the left forehead.  CT CERVICAL SPINE FINDINGS  Dextroscoliosis of the cervical spine with associated multilevel degenerative disc disease is present. Vertebral body heights are preserved. There is no listhesis. Normal C1-2 articulations are intact. No acute fracture or this space.  Visualized soft tissues of the neck are unremarkable. Atherosclerotic calcifications are noted about the carotid bifurcations, right greater than left. Visualized lung apices are clear.  IMPRESSION: CT BRAIN:  Stable appearance of the brain with no acute intracranial process identified.  CT CERVI CAL SPINE:  No acute traumatic injury within the cervical spine.   Electronically Signed   By: Rise Mu M.D.   On: 06/11/2013 05:01   Ct Cervical Spine Wo Contrast  06/11/2013   CLINICAL DATA:  Fall  EXAM: CT HEAD WITHOUT CONTRAST  CT CERVICAL SPINE WITHOUT CONTRAST  TECHNIQUE: Multidetector CT imaging of the head and cervical spine was performed following the standard protocol without intravenous contrast. Multiplanar CT image reconstructions of the cervical spine were also generated.  COMPARISON:  Prior CTs from 06/01/2013 and 11/22/2012.  FINDINGS: CT HEAD FINDINGS  Postoperative changes from prior suboccipital craniotomy  seen. Hypodensity with encephalomalacia and gliosis within the right cerebellar hemisphere is stable. Atrophy with chronic microvascular ischemic disease is also unchanged. No acute intracranial hemorrhage or infarct. No mass or midline shift. There is no extra-axial fluid collection. No skull fracture. Paranasal sinuses and mastoid air cells are clear.  Small laceration is noted at the left forehead.  CT CERVICAL SPINE FINDINGS  Dextroscoliosis of the cervical spine with associated multilevel degenerative disc disease is present. Vertebral body heights are preserved. There is no listhesis. Normal C1-2 articulations are intact. No acute fracture or this space.  Visualized soft tissues of the neck are unremarkable. Atherosclerotic calcifications are noted about the carotid bifurcations, right greater than left. Visualized lung apices are clear.  IMPRESSION: CT BRAIN:  Stable appearance of the brain with no acute intracranial process identified.  CT CERVI CAL SPINE:  No acute traumatic injury within the cervical spine.   Electronically Signed   By: Rise Mu M.D.   On: 06/11/2013 05:01    Scheduled Meds: . brimonidine  1 drop Both Eyes Q8H  .  dorzolamide-timolol  1 drop Right Eye BID  . folic acid  1 mg Oral Daily  . hydrochlorothiazide  25 mg Oral Daily  . insulin aspart  0-9 Units Subcutaneous TID WC  . latanoprost  1 drop Both Eyes QHS  . LORazepam  1 mg Intravenous Once  . losartan  50 mg Oral Daily  . meclizine  25 mg Oral BID  . multivitamin with minerals  1 tablet Oral Daily  . pantoprazole  40 mg Oral Daily  . sodium chloride  3 mL Intravenous Q12H  . thiamine  100 mg Oral Daily   Continuous Infusions:   Principal Problem:   Syncope Active Problems:   Type 2 diabetes mellitus   Hypertension   Right shoulder pain   Right sided weakness    Time spent: >35 minutes     Esperanza Sheets  [facility] Pager [phone]. If 7PM-7AM, please contact night-coverage at  [email or web address], password [facility] 06/12/2013, 9:53 AM  LOS: 1 day

## 2013-06-12 NOTE — ED Provider Notes (Signed)
Procedure was directly supervised by me.   Brandt Loosen, MD 06/12/13 [phone]

## 2013-06-12 NOTE — Progress Notes (Signed)
UR COMPLETED  

## 2013-06-12 NOTE — Clinical Social Work Psychosocial (Signed)
Clinical Social Work Department  BRIEF PSYCHOSOCIAL ASSESSMENT  Patient: Bill Jackson  Account Number: [id number]   Admit date: 06/11/13 Clinical Social Worker Sabino Niemann, MSW Date/Time: 06/12/2013  2:00 PM Referred by: Physician Date Referred: 06/11/13 Referred for   SNF Placement   Other Referral:  Interview type: Patient's son over the phone - patient has dementia and was unable to verbalize his thoughts Other interview type: PSYCHOSOCIAL DATA  Living Status: Family Admitted from facility:  Level of care:  Primary support name: Aldridge,Ethel  Primary support relationship to patient: Sister Degree of support available:  Strong and vested  CURRENT CONCERNS  Current Concerns   Post-Acute Placement   Other Concerns:  SOCIAL WORK ASSESSMENT / PLAN  CSW met with pt re: PT recommendation for SNF.   Pt lives with family  CSW explained placement process and answered questions to patient's son.   There is no preference at this time  CSW completed FL2 and initiated SNF search.     Assessment/plan status: Information/Referral to [facility]  Other assessment/ plan:  Information/referral to community resources:  SNF   PTAR  PATIENT'S/FAMILY'S RESPONSE TO PLAN OF CARE:  Pt's son reports that the family is  agreeable to the patient going to  ST SNF in order to increase strength and independence with mobility prior to returning home  Pt's son verbalized understanding of placement process and appreciation for CSW assist.   Sabino Niemann, MSW [phone]

## 2013-06-12 NOTE — Progress Notes (Signed)
Routine EEG completed.  

## 2013-06-12 NOTE — Clinical Social Work Placement (Addendum)
Clinical Social Work Department  CLINICAL SOCIAL WORK PLACEMENT NOTE  Patient:Bill Jackson Account [id number] Admit date: 06/06/2013  Clinical Social Worker: Sabino Niemann LCSWA Date/time: 06/12/2013 3:15 PM  Clinical Social Work is seeking post-discharge placement for this patient at the following level of care: SKILLED NURSING (*CSW will update this form in Epic as items are completed)  06/12/2013 Patient/family provided with [facility] Department of Clinical Social Work's list of facilities offering this level of care within the geographic area requested by the patient (or if unable, by the patient's family).  11/10/2014Patient/family informed of their freedom to choose among providers that offer the needed level of care, that participate in Medicare, Medicaid or managed care program needed by the patient, have an available bed and are willing to accept the patient.  06/12/2013 Patient/family informed of [facility]' ownership interest in [facility], as well as of the fact that they are under no obligation to receive care at this facility.  PASARR submitted to EDS on  PASARR number received from EDS on  FL2 transmitted to all facilities in geographic area requested by pt/family on 06/12/2013  FL2 transmitted to all facilities within larger geographic area on  Patient informed that his/her managed care company has contracts with or will negotiate with certain facilities, including the following:  Patient/family informed of bed offers received: 06/13/13 Patient chooses bed at [facility] Physician recommends and patient chooses bed at  Patient to be transferred to [facility] on 06/13/13 Patient to be transferred to facility by [facility] The following physician request were entered in Epic:  Additional Comments:

## 2013-06-12 NOTE — Progress Notes (Signed)
Inpatient Diabetes Program Recommendations  AACE/ADA: New Consensus Statement on Inpatient Glycemic Control (2013)  Target Ranges:  Prepandial:   less than 140 mg/dL      Peak postprandial:   less than 180 mg/dL (1-2 hours)      Critically ill patients:  140 - 180 mg/dL  Results for Bill Jackson, Bill Jackson (MRN [id number]) as of 06/12/2013 09:26  Ref. Range 06/11/2013 08:23 06/11/2013 11:34 06/11/2013 16:30 06/11/2013 21:45 06/12/2013 07:47  Glucose-Capillary Latest Range: 70-99 mg/dL 865 (H) 784 (H) 696 (H) 236 (H) 157 (H)   Inpatient Diabetes Program Recommendations Insulin - Basal: consider adding basal Lantus or Levemir 10 units  HgbA1C: =6.2 Thank you  Piedad Climes BSN, RN,CDE Inpatient Diabetes Coordinator [phone] (team pager)

## 2013-06-12 NOTE — Progress Notes (Signed)
NEURO HOSPITALIST PROGRESS NOTE   SUBJECTIVE:                                                                                                                         Patient is more alert today.  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 EEG pending. OBJECTIVE:                                                                                                                           Vital signs in last 24 hours: Temp:  [98.2 F (36.8 C)-99.6 F (37.6 C)] 98.3 F (36.8 C) (11/10 0743) Pulse Rate:  [52-79] 79 (11/10 0743) Resp:  [16-20] 20 (11/10 0743) BP: (92-107)/(45-69) 107/69 mmHg (11/10 0743) SpO2:  [99 %-100 %] 100 % (11/10 0743)  Intake/Output from previous day: 11/09 0701 - 11/10 0700 In: 1080 [P.O.:1080] Out: 202 [Urine:201; Stool:1] Intake/Output this shift:   Nutritional status: Dysphagia  Past Medical History  Diagnosis Date  . Hypertension   . Diabetes mellitus   . Stroke      Neurologic Exam:  Mental Status: Alert,  Oriented place but not year, thought content appropriate.  Speech dysarthric.  Able to follow 3 step commands without difficulty. Cranial Nerves: II: Discs flat bilaterally; Visual fields grossly normal, pupils equal, round, reactive to light and accommodation III,IV, VI: ptosis not present, extra-ocular motions intact bilaterally V,VII: smile symmetric, facial light touch sensation normal bilaterally VIII: hearing normal bilaterally IX,X: gag reflex present XI: bilateral shoulder shrug XII: midline tongue extension without atrophy or fasciculations  Motor: Right : Upper extremity   5/5    Left:     Upper extremity   4/5  Lower extremity   4/5     Lower extremity   4/5 Tone and bulk:normal tone throughout; no atrophy noted Sensory: Pinprick and light touch intact throughout, bilaterally Deep Tendon Reflexes:  deminished throughout    Lab Results: No  results found for this basename: cbc, bmp, coags, chol, tri, ldl, hga1c   Lipid Panel No results found for this basename: CHOL, TRIG, HDL, CHOLHDL, VLDL, LDLCALC,  in the last 72 hours  Studies/Results: Dg Chest 2 View  06/11/2013   CLINICAL DATA:  Status post fall.  Concern for chest injury.  EXAM: CHEST  2 VIEW  COMPARISON:  Chest radiograph performed 04/26/2013  FINDINGS: The lungs are well-aerated and clear. There is no evidence of focal opacification, pleural effusion or pneumothorax.  The heart is normal in size; the mediastinal contour is within normal limits. No acute osseous abnormalities are seen.  IMPRESSION: No acute cardiopulmonary process seen; no displaced rib fractures identified.   Electronically Signed   By: Roanna Raider M.D.   On: 06/11/2013 05:21   Ct Head Wo Contrast  06/11/2013   CLINICAL DATA:  Fall  EXAM: CT HEAD WITHOUT CONTRAST  CT CERVICAL SPINE WITHOUT CONTRAST  TECHNIQUE: Multidetector CT imaging of the head and cervical spine was performed following the standard protocol without intravenous contrast. Multiplanar CT image reconstructions of the cervical spine were also generated.  COMPARISON:  Prior CTs from 06/01/2013 and 11/22/2012.  FINDINGS: CT HEAD FINDINGS  Postoperative changes from prior suboccipital craniotomy seen. Hypodensity with encephalomalacia and gliosis within the right cerebellar hemisphere is stable. Atrophy with chronic microvascular ischemic disease is also unchanged. No acute intracranial hemorrhage or infarct. No mass or midline shift. There is no extra-axial fluid collection. No skull fracture. Paranasal sinuses and mastoid air cells are clear.  Small laceration is noted at the left forehead.  CT CERVICAL SPINE FINDINGS  Dextroscoliosis of the cervical spine with associated multilevel degenerative disc disease is present. Vertebral body heights are preserved. There is no listhesis. Normal C1-2 articulations are intact. No acute fracture or this space.   Visualized soft tissues of the neck are unremarkable. Atherosclerotic calcifications are noted about the carotid bifurcations, right greater than left. Visualized lung apices are clear.  IMPRESSION: CT BRAIN:  Stable appearance of the brain with no acute intracranial process identified.  CT CERVI CAL SPINE:  No acute traumatic injury within the cervical spine.   Electronically Signed   By: Rise Mu M.D.   On: 06/11/2013 05:01   Ct Cervical Spine Wo Contrast  06/11/2013   CLINICAL DATA:  Fall  EXAM: CT HEAD WITHOUT CONTRAST  CT CERVICAL SPINE WITHOUT CONTRAST  TECHNIQUE: Multidetector CT imaging of the head and cervical spine was performed following the standard protocol without intravenous contrast. Multiplanar CT image reconstructions of the cervical spine were also generated.  COMPARISON:  Prior CTs from 06/01/2013 and 11/22/2012.  FINDINGS: CT HEAD FINDINGS  Postoperative changes from prior suboccipital craniotomy seen. Hypodensity with encephalomalacia and gliosis within the right cerebellar hemisphere is stable. Atrophy with chronic microvascular ischemic disease is also unchanged. No acute intracranial hemorrhage or infarct. No mass or midline shift. There is no extra-axial fluid collection. No skull fracture. Paranasal sinuses and mastoid air cells are clear.  Small laceration is noted at the left forehead.  CT CERVICAL SPINE FINDINGS  Dextroscoliosis of the cervical spine with associated multilevel degenerative disc disease is present. Vertebral body heights are preserved. There is no listhesis. Normal C1-2 articulations are intact. No acute fracture or this space.  Visualized soft tissues of the neck are unremarkable. Atherosclerotic calcifications are noted about the carotid bifurcations, right greater than left. Visualized lung apices are clear.  IMPRESSION: CT BRAIN:  Stable appearance of the brain with no acute intracranial process identified.  CT CERVI CAL SPINE:  No acute traumatic  injury within the cervical spine.   Electronically Signed   By: Rise Mu M.D.   On: 06/11/2013 05:01   Mr Brain Wo Contrast  06/12/2013   CLINICAL DATA:  Syncope with collapse.  EXAM: MRI HEAD WITHOUT CONTRAST  TECHNIQUE: Multiplanar, multiecho pulse sequences of the brain and surrounding structures were obtained without intravenous contrast.  COMPARISON:  CT head 06/11/2013 Most recent. Also CT head 10/24/2012 and subsequent.  FINDINGS: No acute stroke or acute hemorrhage. No mass lesion or hydrocephalus. No extra-axial fluid.  Moderate cerebral and cerebellar atrophy. Mild subcortical and periventricular T2 and FLAIR hyperintensities, likely chronic microvascular ischemic change.  The patient is status post suboccipital craniectomy on the right for evacuation of a large cerebellar hemorrhage which was acute 10/24/2012. Chronic blood products as well as encephalomalacia can be seen in the right greater than left cerebellar hemispheres and also the vermis. Ex vacuo enlargement of the 4th ventricle dorsally. Small dorsal upper pontine lacune. Prominent perivascular spaces. No large vessel cortical infarct.  Flow voids are maintained throughout the carotid, basilar, and vertebral arteries. There are no other areas of chronic hemorrhage.  Pituitary, pineal, and cerebellar tonsils unremarkable. No upper cervical lesions. Visualized calvarium, skull base, and upper cervical osseous structures unremarkable. Scalp and extracranial soft tissues, orbits, sinuses, and mastoids show no acute process.  IMPRESSION: Chronic changes as described. No acute intracranial abnormality. Good agreement with prior studies.   Electronically Signed   By: Davonna Belling M.D.   On: 06/12/2013 10:58    MEDICATIONS                                                                                                                        Scheduled: . brimonidine  1 drop Both Eyes Q8H  . dorzolamide-timolol  1 drop Right Eye BID   . folic acid  1 mg Oral Daily  . hydrochlorothiazide  25 mg Oral Daily  . insulin aspart  0-9 Units Subcutaneous TID WC  . latanoprost  1 drop Both Eyes QHS  . LORazepam  1 mg Intravenous Once  . losartan  50 mg Oral Daily  . meclizine  25 mg Oral BID  . multivitamin with minerals  1 tablet Oral Daily  . pantoprazole  40 mg Oral Daily  . sodium chloride  3 mL Intravenous Q12H  . thiamine  100 mg Oral Daily    ASSESSMENT/PLAN:                                                                                                             Syncope versus seizure: the period of unresponsiveness was too prolonged to be consistent with a seizure or syncope.  TIA likely as he has multiple risk  factors for stroke.  EEG already requested. MRI brain shows no acute infarct.   Will follow    Assessment and plan discussed with with attending physician and they are in agreement.    Felicie Morn PA-C [facility] [phone]  06/12/2013, 11:34 AM

## 2013-06-12 NOTE — Progress Notes (Signed)
INITIAL NUTRITION ASSESSMENT  DOCUMENTATION CODES Per approved criteria  -Severe malnutrition in the context of chronic illness   INTERVENTION: 1. RD will honor food preferences per patient.  2. Ensure Complete BID. 8 oz provides 350 kcal, 13 g protein.  3. Encourage adequate intake of meals.   NUTRITION DIAGNOSIS: Predicted suboptimal energy intake related to swallowing difficulty/syncope as evidenced by recent history of fair intake.   Goal: Patient will meet >/=90% of estimated nutrition needs.  Monitor:  PO intake, weight, labs  Reason for Assessment: Malnutrition screening tool  78 y.o. male  Admitting Dx: Syncope  ASSESSMENT: Patient with recent hemorrhagic CVA s/p craniectomy, admitted with weakness, syncope, and collapse suspicious for recurrent TIA. SLP evaluation today, patient with coughing after drinking liquids, recommends Dysphagia 3, thin diet.   He is eating well currently, finishing 75-100% of meals. However, he has lost about 21% of his weight in 8 months likely due to swallowing difficulties.    Patient meets the criteria for severe MALNUTRITION in the context of chronic illness with 21% weight loss in 8 months and PO intake <75% of estimated needs for >1 month.   Height: Ht Readings from Last 1 Encounters:  06/11/13 6' (1.829 m)    Weight: Wt Readings from Last 1 Encounters:  06/11/13 160 lb 0.9 oz (72.6 kg)    Ideal Body Weight: 178 pounds  % Ideal Body Weight: 90%  Wt Readings from Last 10 Encounters:  06/11/13 160 lb 0.9 oz (72.6 kg)  04/26/13 170 lb (77.111 kg)  11/02/12 205 lb 4 oz (93.1 kg)  10/25/12 203 lb 0.7 oz (92.1 kg)  10/25/12 203 lb 0.7 oz (92.1 kg)    Usual Body Weight: 203 pounds  % Usual Body Weight: 79%  BMI:  Body mass index is 21.7 kg/(m^2). Patient is normal weight.   Estimated Nutritional Needs: Kcal: 1950-2100 kcal Protein: 85-100 g Fluid: >2.2 L/day  Skin: Laceration, head  Diet Order: Dysphagia 3,  thin  EDUCATION NEEDS: -No education needs identified at this time   Intake/Output Summary (Last 24 hours) at 06/12/13 1235 Last data filed at 06/11/13 1820  Gross per 24 hour  Intake    600 ml  Output    102 ml  Net    498 ml    Last BM: 11/9   Labs:   Recent Labs Lab 06/11/13 0400 06/12/13 0600  NA 141 141  K 4.0 3.7  CL 104 103  CO2 26 29  BUN 18 18  CREATININE 1.36* 1.37*  CALCIUM 9.6 9.3  GLUCOSE 70 140*    CBG (last 3)   Recent Labs  06/11/13 2145 06/12/13 0747 06/12/13 1105  GLUCAP 236* 157* 164*    Scheduled Meds: . brimonidine  1 drop Both Eyes Q8H  . dorzolamide-timolol  1 drop Right Eye BID  . folic acid  1 mg Oral Daily  . hydrochlorothiazide  25 mg Oral Daily  . insulin aspart  0-9 Units Subcutaneous TID WC  . latanoprost  1 drop Both Eyes QHS  . LORazepam  1 mg Intravenous Once  . losartan  50 mg Oral Daily  . meclizine  25 mg Oral BID  . multivitamin with minerals  1 tablet Oral Daily  . pantoprazole  40 mg Oral Daily  . sodium chloride  3 mL Intravenous Q12H  . thiamine  100 mg Oral Daily    Continuous Infusions:   Past Medical History  Diagnosis Date  . Hypertension   . Diabetes  mellitus   . Stroke     Past Surgical History  Procedure Laterality Date  . Heart stents  x3  . Cardiac surgery    . Hernia repair    . Rotator cuff repair    . Craniectomy N/A 10/25/2012    Procedure: Suboccipital Craniectomy for Evacuation of Cerebellar Hematoma;  Surgeon: Maeola Harman, MD;  Location: [facility];  Service: Neurosurgery;  Laterality: N/A;  Suboccipital Craniectomy for Evacuation of Cerebellar Hematoma    Linnell Fulling, RD, LDN Pager #: [phone] After-Hours Pager #: [phone]

## 2013-06-12 NOTE — Evaluation (Signed)
Clinical/Bedside Swallow Evaluation [name] MRN: [id number] Date of Birth: [birth year]  Today's Date: 06/12/2013 Time: 5621-3086 SLP Time Calculation (min): 26 min  Past Medical History:  Past Medical History  Diagnosis Date  . Hypertension   . Diabetes mellitus   . Stroke    Past Surgical History:  Past Surgical History  Procedure Laterality Date  . Heart stents  x3  . Cardiac surgery    . Hernia repair    . Rotator cuff repair    . Craniectomy N/A 10/25/2012    Procedure: Suboccipital Craniectomy for Evacuation of Cerebellar Hematoma;  Surgeon: Maeola Harman, MD;  Location: [facility];  Service: Neurosurgery;  Laterality: N/A;  Suboccipital Craniectomy for Evacuation of Cerebellar Hematoma   HPI:  78 y.o. 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 [name], transferred to CIR then rehab and now living at home for the past 4 months, with residual dizziness, slurred speech, right sided weakness, DM, HTN, presents with syncope and collapse overnight.  He has slurred speech and has weakness but appreciates that he is at baseline without new deficits.  Head CT Stable appearance of the brain with no acute intracranial process identified.  CXR No acute cardiopulmonary process seen; no displaced rib fractures identified.  MBS 11/01/12 following CVA recommended Dys. 2 textures with nectar-thick liquids (flash and intermittent penetration from pyriform sinuses), Multiple dry swallows after each bite/sip;Clear throat intermittently;Follow solids with liquid.  One episode reflux observed.   Assessment / Plan / Recommendation Clinical Impression  Swallow assessment revealed probable aspiration with thin water with immediate and delayed coughing.  He reports frequent coughing at home with  liquids.  Therapeutic intervention provided during session such as chin tuck posture not effective resulting in cough.  Pt.'s right side is weaker following CVA, therefore a right head turn was attempted and prevented coughing with 6 out of 7 trials.  No difficulty masticating cracker.  Recommend upgrade to Dys 3 (not regular due to decreased energy level and for conservation) and thin liquids, no straws, turn head to right with liquids, pills whole in applesauce.       Aspiration Risk  Moderate    Diet Recommendation Dysphagia 3 (Mechanical Soft);Thin liquid   Liquid Administration via: Cup;No straw Medication Administration: Whole meds with puree Supervision: Patient able to self feed;Intermittent supervision to cue for compensatory strategies Compensations: Slow rate;Small sips/bites Postural Changes and/or Swallow Maneuvers: Head turn right during swallow;Seated upright 90 degrees;Upright 30-60 min after meal    Other  Recommendations Oral Care Recommendations: Oral care BID   Follow Up Recommendations   (TBD)    Frequency and Duration min 2x/week  2 weeks   Pertinent Vitals/Pain n/a         Swallow Study         Oral/Motor/Sensory Function Overall Oral Motor/Sensory Function: Appears within functional limits for tasks assessed   Ice Chips Ice chips: Not tested   Thin Liquid Thin Liquid: Impaired Presentation: Spoon;Cup Pharyngeal  Phase Impairments: Suspected delayed Swallow;Cough - Immediate;Cough - Delayed    Nectar Thick Nectar Thick Liquid: Not tested   Honey Thick Honey Thick Liquid: Not tested   Puree Puree: Not tested   Solid   GO Functional Assessment Tool Used: clinical judgement Functional Limitations: Swallowing Swallow Current Status ([id number]): At least 40 percent  but less than 60 percent impaired, limited or restricted Swallow Goal Status [phone]): At least 20 percent but less than 40 percent impaired, limited or restricted  Solid: Within functional  limits       Royce Macadamia M.Ed [employer] [phone]  06/12/2013

## 2013-06-13 ENCOUNTER — Other Ambulatory Visit: Payer: Self-pay | Admitting: *Deleted

## 2013-06-13 LAB — GLUCOSE, CAPILLARY
Glucose-Capillary: 150 mg/dL — ABNORMAL HIGH (ref 70–99)
Glucose-Capillary: 254 mg/dL — ABNORMAL HIGH (ref 70–99)

## 2013-06-13 MED ORDER — HYDROCODONE-ACETAMINOPHEN 5-325 MG PO TABS: 1.0000 | ORAL_TABLET | ORAL | Status: DC | PRN

## 2013-06-13 MED ORDER — GLIPIZIDE 10 MG PO TABS: 5.0000 mg | ORAL_TABLET | Freq: Every day | ORAL | Status: DC

## 2013-06-13 MED ORDER — SENNA 8.6 MG PO TABS: 1.0000 | ORAL_TABLET | Freq: Every day | ORAL | Status: DC

## 2013-06-13 MED ORDER — SENNA 8.6 MG PO TABS
1.0000 | ORAL_TABLET | Freq: Every day | ORAL | Status: DC
  Administered 2013-06-13: 8.6 mg via ORAL
  Filled 2013-06-13: qty 1

## 2013-06-13 MED ORDER — HYDROCODONE-ACETAMINOPHEN 5-325 MG PO TABS: ORAL_TABLET | ORAL | Status: DC

## 2013-06-13 MED ORDER — MAGNESIUM HYDROXIDE 400 MG/5ML PO SUSP: 15.0000 mL | Freq: Every day | ORAL | Status: DC

## 2013-06-13 MED ORDER — MAGNESIUM HYDROXIDE 400 MG/5ML PO SUSP
15.0000 mL | Freq: Every day | ORAL | Status: DC
  Administered 2013-06-13: 15 mL via ORAL
  Filled 2013-06-13: qty 30

## 2013-06-13 NOTE — Progress Notes (Signed)
CSW (Clinical Child psychotherapist) spoke with pt and pt POA Michiel Cowboy who was at pt bedside. CSW presented pt and pt POA with bed offers. Pt has chosen bed at [facility]. CSW has called and left voicemail with facility to notify. CSW has also spoken with [facility] rep to notify of dc today and facility choice. CSW still awaiting insurance auth.  Maxime Beckner, LCSWA [id number]

## 2013-06-13 NOTE — Discharge Summary (Signed)
Physician Discharge Summary  Bill Jackson XBJ:[id number] DOB: 1934-12-02 DOA: 06/11/2013  PCP: Pcp Not In System  Admit date: 06/11/2013 Discharge date: 06/13/2013  Time spent: >35 minutes  Recommendations for Outpatient Follow-up:  F/u with PCP in 1 week   Discharge Diagnoses:  Principal Problem:   Syncope Active Problems:   Type 2 diabetes mellitus   Hypertension   Right shoulder pain   Right sided weakness   Protein-calorie malnutrition, severe   Discharge Condition: stable   Diet recommendation: heart healthy   Filed Weights   06/11/13 0822 06/11/13 0835  Weight: 72.621 kg (160 lb 1.6 oz) 72.6 kg (160 lb 0.9 oz)    History of present illness:  78 y.o. 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 [name], transferred to CIR then rehab and now living at home for the past 4 months, with residual dizziness, slurred speech, right sided weakness presents with syncope and collapse overnight.   Hospital Course:  1. Syncope vs seizure; r/o TIA: Patient also has risk factors for recurrent TIA; but MRI: no acute infacrts, showed old CVA s/p post changes;  EEG: unremarkable;  PT eval: SN F -? Related to hypoglycemia; episodes of d/c Insulin, decreased glipizide to once a day; close monitor at SNF  2. HTN; cont home regimen  3. DM HA1C-6.2; episode of hypoglycemia; d/c insulin, decrease glipizide to 5 mg once a day; repeat HA1C 4 weeks  4. Shoulder pain due to recent disclocation - s/p fall without syncope 3 weeks ago. Analgesia as needed  5. 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 [name]  - with residual dizziness, slurred speech, right sided weakness  - not on antiplatelets ? due to hemorrhagic  CVA;  -cont rehab at SNF, f/u with PCP in 1 week  And neurology f/u in 2-3 week    Procedures:  MRI head (i.e. Studies not automatically included, echos, thoracentesis, etc; not x-rays)  Consultations:  Neurology   Discharge Exam: Filed Vitals:   06/13/13 1004  BP: 103/59  Pulse:   Temp:   Resp:     General: alert, mild aphasia not new  Cardiovascular: S1, S2, rrr Respiratory: CTA BL   Discharge Instructions  Discharge Orders   Future Appointments Provider Department Dept Phone   06/20/2013 10:30 AM Adam Gus Rankin, DO [facility] Neurology [facility] [phone]   Future Orders Complete By Expires   Diet - low sodium heart healthy  As directed    Discharge instructions  As directed    Comments:     Follow up with neurologist in 2-3 weeks Follow up with primary care doctor in 1 week after rehabilitation   Increase activity slowly  As directed        Medication List    STOP taking these medications       insulin aspart protamine- aspart (70-30) 100 UNIT/ML injection  Commonly known as:  NOVOLOG MIX 70/30      TAKE these medications       brimonidine 0.2 % ophthalmic solution  Commonly known as:  ALPHAGAN  Place 1 drop into both eyes every 8 (eight) hours.     dorzolamide-timolol 22.3-6.8 MG/ML ophthalmic solution  Commonly known as:  COSOPT  Place 1 drop into the  right eye 2 (two) times daily.     folic acid 1 MG tablet  Commonly known as:  FOLVITE  Take 1 tablet (1 mg total) by mouth daily.     glipiZIDE 10 MG tablet  Commonly known as:  GLUCOTROL  Take 0.5 tablets (5 mg total) by mouth daily before breakfast.     hydrochlorothiazide 25 MG tablet  Commonly known as:  HYDRODIURIL  Take 1 tablet (25 mg total) by mouth daily.     HYDROcodone-acetaminophen 5-325 MG per tablet  Commonly known as:  NORCO  Take 1 tablet by mouth every 4 (four) hours as needed.     Insulin Syringes (Disposable) U-100 0.3 ML Misc  10 Units by Does not apply route 2  (two) times daily.     latanoprost 0.005 % ophthalmic solution  Commonly known as:  XALATAN  Place 1 drop into both eyes at bedtime.     losartan 100 MG tablet  Commonly known as:  COZAAR  Take 50 mg by mouth daily.     meclizine 25 MG tablet  Commonly known as:  ANTIVERT  Take 1 tablet (25 mg total) by mouth 2 (two) times daily.     multivitamin with minerals Tabs tablet  Take 1 tablet by mouth daily.     pantoprazole 40 MG tablet  Commonly known as:  PROTONIX  Take 1 tablet (40 mg total) by mouth daily.     potassium chloride 10 MEQ tablet  Commonly known as:  K-DUR,KLOR-CON  Take 1 tablet (10 mEq total) by mouth daily.     thiamine 100 MG tablet  Take 1 tablet (100 mg total) by mouth daily.       Allergies  Allergen Reactions  . Lotensin [Benazepril Hcl]     cough  . Penicillins Other (See Comments)    pt becomes "nervous"       Follow-up Information   Follow up with Pcp Not In System Today.      Follow up with Pcp Not In System.      Follow up with [facility]     In 1 week.   Contact information:   [address] [place] [address] [phone]       The results of significant diagnostics from this hospitalization (including imaging, microbiology, ancillary and laboratory) are listed below for reference.    Significant Diagnostic Studies: Dg Chest 2 View  06/11/2013   CLINICAL DATA:  Status post fall.  Concern for chest injury.  EXAM: CHEST  2 VIEW  COMPARISON:  Chest radiograph performed 04/26/2013  FINDINGS: The lungs are well-aerated and clear. There is no evidence of focal opacification, pleural effusion or pneumothorax.  The heart is normal in size; the mediastinal contour is within normal limits. No acute osseous abnormalities are seen.  IMPRESSION: No acute cardiopulmonary process seen; no displaced rib fractures identified.   Electronically Signed   By: Roanna Raider M.D.   On: 06/11/2013 05:21   Ct Head  Wo Contrast  06/11/2013   CLINICAL DATA:  Fall  EXAM: CT HEAD WITHOUT CONTRAST  CT CERVICAL SPINE WITHOUT CONTRAST  TECHNIQUE: Multidetector CT imaging of the head and cervical spine was performed following the standard protocol without intravenous contrast. Multiplanar CT image reconstructions of the cervical spine were also generated.  COMPARISON:  Prior CTs from 06/01/2013 and 11/22/2012.  FINDINGS: CT HEAD FINDINGS  Postoperative changes from prior suboccipital craniotomy seen. Hypodensity with encephalomalacia and gliosis within  the right cerebellar hemisphere is stable. Atrophy with chronic microvascular ischemic disease is also unchanged. No acute intracranial hemorrhage or infarct. No mass or midline shift. There is no extra-axial fluid collection. No skull fracture. Paranasal sinuses and mastoid air cells are clear.  Small laceration is noted at the left forehead.  CT CERVICAL SPINE FINDINGS  Dextroscoliosis of the cervical spine with associated multilevel degenerative disc disease is present. Vertebral body heights are preserved. There is no listhesis. Normal C1-2 articulations are intact. No acute fracture or this space.  Visualized soft tissues of the neck are unremarkable. Atherosclerotic calcifications are noted about the carotid bifurcations, right greater than left. Visualized lung apices are clear.  IMPRESSION: CT BRAIN:  Stable appearance of the brain with no acute intracranial process identified.  CT CERVI CAL SPINE:  No acute traumatic injury within the cervical spine.   Electronically Signed   By: Rise Mu M.D.   On: 06/11/2013 05:01   Ct Head Wo Contrast  06/01/2013   CLINICAL DATA:  Generalized weakness. Extremity weakness.  EXAM: CT HEAD WITHOUT CONTRAST  TECHNIQUE: Contiguous axial images were obtained from the base of the skull through the vertex without intravenous contrast.  COMPARISON:  11/22/2012  FINDINGS: Skull and Sinuses:Status post suboccipital craniotomy for  treatment of posterior fossa hemorrhage.  Orbits: Bilateral cataract resection.  Brain: No evidence of acute abnormality, such as acute infarction, hemorrhage, hydrocephalus, or mass lesion/mass effect. Unchanged pattern of encephalomalacia and gliosis in the cerebellum, mainly the vermis and right hemisphere, related to previous hemorrhagic stroke. Patchy bilateral cerebral white matter low density is also stable, consistent with chronic small vessel ischemia.  IMPRESSION: Stable appearance of the brain. No evidence of acute intracranial disease.   Electronically Signed   By: Tiburcio Pea M.D.   On: 06/01/2013 05:42   Ct Cervical Spine Wo Contrast  06/11/2013   CLINICAL DATA:  Fall  EXAM: CT HEAD WITHOUT CONTRAST  CT CERVICAL SPINE WITHOUT CONTRAST  TECHNIQUE: Multidetector CT imaging of the head and cervical spine was performed following the standard protocol without intravenous contrast. Multiplanar CT image reconstructions of the cervical spine were also generated.  COMPARISON:  Prior CTs from 06/01/2013 and 11/22/2012.  FINDINGS: CT HEAD FINDINGS  Postoperative changes from prior suboccipital craniotomy seen. Hypodensity with encephalomalacia and gliosis within the right cerebellar hemisphere is stable. Atrophy with chronic microvascular ischemic disease is also unchanged. No acute intracranial hemorrhage or infarct. No mass or midline shift. There is no extra-axial fluid collection. No skull fracture. Paranasal sinuses and mastoid air cells are clear.  Small laceration is noted at the left forehead.  CT CERVICAL SPINE FINDINGS  Dextroscoliosis of the cervical spine with associated multilevel degenerative disc disease is present. Vertebral body heights are preserved. There is no listhesis. Normal C1-2 articulations are intact. No acute fracture or this space.  Visualized soft tissues of the neck are unremarkable. Atherosclerotic calcifications are noted about the carotid bifurcations, right greater than  left. Visualized lung apices are clear.  IMPRESSION: CT BRAIN:  Stable appearance of the brain with no acute intracranial process identified.  CT CERVI CAL SPINE:  No acute traumatic injury within the cervical spine.   Electronically Signed   By: Rise Mu M.D.   On: 06/11/2013 05:01   Mr Brain Wo Contrast  06/12/2013   CLINICAL DATA:  Syncope with collapse.  EXAM: MRI HEAD WITHOUT CONTRAST  TECHNIQUE: Multiplanar, multiecho pulse sequences of the brain and surrounding structures were obtained without intravenous contrast.  COMPARISON:  CT head 06/11/2013 Most recent. Also CT head 10/24/2012 and subsequent.  FINDINGS: No acute stroke or acute hemorrhage. No mass lesion or hydrocephalus. No extra-axial fluid.  Moderate cerebral and cerebellar atrophy. Mild subcortical and periventricular T2 and FLAIR hyperintensities, likely chronic microvascular ischemic change.  The patient is status post suboccipital craniectomy on the right for evacuation of a large cerebellar hemorrhage which was acute 10/24/2012. Chronic blood products as well as encephalomalacia can be seen in the right greater than left cerebellar hemispheres and also the vermis. Ex vacuo enlargement of the 4th ventricle dorsally. Small dorsal upper pontine lacune. Prominent perivascular spaces. No large vessel cortical infarct.  Flow voids are maintained throughout the carotid, basilar, and vertebral arteries. There are no other areas of chronic hemorrhage.  Pituitary, pineal, and cerebellar tonsils unremarkable. No upper cervical lesions. Visualized calvarium, skull base, and upper cervical osseous structures unremarkable. Scalp and extracranial soft tissues, orbits, sinuses, and mastoids show no acute process.  IMPRESSION: Chronic changes as described. No acute intracranial abnormality. Good agreement with prior studies.   Electronically Signed   By: Davonna Belling M.D.   On: 06/12/2013 10:58    Microbiology: No results found for this or  any previous visit (from the past 240 hour(s)).   Labs: Basic Metabolic Panel:  Recent Labs Lab 06/11/13 0400 06/12/13 0600  NA 141 141  K 4.0 3.7  CL 104 103  CO2 26 29  GLUCOSE 70 140*  BUN 18 18  CREATININE 1.36* 1.37*  CALCIUM 9.6 9.3   Liver Function Tests: No results found for this basename: AST, ALT, ALKPHOS, BILITOT, PROT, ALBUMIN,  in the last 168 hours No results found for this basename: LIPASE, AMYLASE,  in the last 168 hours No results found for this basename: AMMONIA,  in the last 168 hours CBC:  Recent Labs Lab 06/11/13 0400 06/12/13 0600  WBC 6.5 4.1  NEUTROABS 4.8  --   HGB 12.8* 11.9*  HCT 37.2* 35.0*  MCV 89.2 89.7  PLT 328 321   Cardiac Enzymes:  Recent Labs Lab 06/11/13 0754 06/11/13 1414 06/11/13 1930  TROPONINI <0.30 <0.30 <0.30   BNP: BNP (last 3 results) No results found for this basename: PROBNP,  in the last 8760 hours CBG:  Recent Labs Lab 06/12/13 0747 06/12/13 1105 06/12/13 1631 06/12/13 2126 06/13/13 0723  GLUCAP 157* 164* 222* 154* 150*       Signed:  Sharifa Bucholz N  [facility] 06/13/2013, 10:28 AM

## 2013-06-13 NOTE — Progress Notes (Signed)
D/c orders received;IV removed with gauze on, pt remains in stable condition, pt meds and instructions reviewed and given to pt; attempted to call caretaker Misty Stanley and review d/c paperwork, left message asking for her to return phone call regarding pts d/c information; pt refused to sign AVS until Misty Stanley was spoken to about d/; pt to be d/c to [facility] taken via [facility], attempted to call report to [facility]

## 2013-06-13 NOTE — Progress Notes (Signed)
Speech Language Pathology Treatment:    Patient Details Name: Bill Jackson MRN: [id number] DOB: 07-07-1935 Today's Date: 06/13/2013 Time: 4098-1191 SLP Time Calculation (min): 16 min  Assessment / Plan / Recommendation Clinical Impression  Pt demonstrated a cough 1 of 5 swallows of thin liquid, after which he was cued to take small sips. Pt independent in using head turn compensatory strategy when swallowing; no reminders needed. Pt still demonstrating general weakness/ prolonged oral phase while eating. Therefore recommend continuing with current diet- Dys 3 with thin from cup, and continued use of strategies recommended during BSE. Speech will continue to follow for diet tolerance/ advancement.   HPI HPI: 78 y.o. 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 [name], transferred to CIR then rehab and now living at home for the past 4 months, with residual dizziness, slurred speech, right sided weakness, DM, HTN, presents with syncope and collapse overnight.  He has slurred speech and has weakness but appreciates that he is at baseline without new deficits.  Head CT Stable appearance of the brain with no acute intracranial process identified.  CXR No acute cardiopulmonary process seen; no displaced rib fractures identified.  MBS 11/01/12 following CVA recommended Dys. 2 textures with nectar-thick liquids (flash and intermittent penetration from pyriform sinuses), Multiple dry swallows after each bite/sip;Clear throat intermittently;Follow solids with liquid.  One episode reflux observed.   Pertinent Vitals n/a  SLP Plan  Continue with current plan of care    Recommendations Liquids provided via: Cup;No straw Medication Administration: Whole meds with puree Supervision: Patient able to self feed;Intermittent supervision to  cue for compensatory strategies Compensations: Slow rate;Small sips/bites Postural Changes and/or Swallow Maneuvers: Head turn right during swallow;Seated upright 90 degrees;Upright 30-60 min after meal              Oral Care Recommendations: Oral care BID Plan: Continue with current plan of care    GO Functional Assessment Tool Used: clinical judgement Functional Limitations: Swallowing Swallow Current Status ([id number]): At least 40 percent but less than 60 percent impaired, limited or restricted Swallow Goal Status [phone]): At least 20 percent but less than 40 percent impaired, limited or restricted   Metro Kung, [place], CCC-SLP 06/13/2013, 2:04 PM

## 2013-06-13 NOTE — Progress Notes (Signed)
I followed up with Bill Jackson today and our visit today was brief but pleasant. This patient had a smile on his face the entire time we were in dialogue together and he was also telling jokes to me as well. He told me that God has been good to him and that he will continue to stay strong despite the physical hurdles that have him laid up in the hospital bed currently. Although we was in a good conservation it ended abruptly because he was getting sleepy and he told me he needed to get some rest. So before I left the room, I offered to pray with the patient and he accepted my offer and we prayed together.  Chaplain Bryson Ha

## 2013-06-13 NOTE — Progress Notes (Signed)
CSW (Clinical Child psychotherapist) has prepared pt dc packet and placed with shadow chart. CSW arranged for non emergent ambulance transport for 2:30pm. Pt nurse, family/POA, and facility all made aware. CSW signing off.  Ramell Wacha, LCSWA [phone]

## 2013-06-13 NOTE — Progress Notes (Signed)
STROKE/TIA DISCHARGE INSTRUCTIONS SMOKING Cigarette smoking nearly doubles your risk of having a stroke & is the single most alterable risk factor  If you smoke or have smoked in the last 12 months, you are advised to quit smoking for your health.  Most of the excess cardiovascular risk related to smoking disappears within a year of stopping.  Ask you doctor about anti-smoking medications  [place] Quit Line: 1-800-QUIT NOW  Free Smoking Cessation Classes [phone]  CHOLESTEROL Know your levels; limit fat & cholesterol in your diet  Lipid Panel  No results found for this basename: chol, trig, hdl, cholhdl, vldl, ldlcalc      Many patients benefit from treatment even if their cholesterol is at goal.  Goal: Total Cholesterol (CHOL) less than 160  Goal:  Triglycerides (TRIG) less than 150  Goal:  HDL greater than 40  Goal:  LDL (LDLCALC) less than 100   BLOOD PRESSURE American Stroke Association blood pressure target is less that 120/80 mm/Hg  Your discharge blood pressure is:  BP: 109/58 mmHg  Monitor your blood pressure  Limit your salt and alcohol intake  Many individuals will require more than one medication for high blood pressure  DIABETES (A1c is a blood sugar average for last 3 months) Goal HGBA1c is under 7% (HBGA1c is blood sugar average for last 3 months)  Diabetes:     Lab Results  Component Value Date   HGBA1C 6.2* 06/11/2013     Your HGBA1c can be lowered with medications, healthy diet, and exercise.  Check your blood sugar as directed by your physician  Call your physician if you experience unexplained or low blood sugars.  PHYSICAL ACTIVITY/REHABILITATION Goal is 30 minutes at least 4 days per week  Activity: Increase activity slowly, Therapies: Physical Therapy: Nursing Facility Return to work:   Activity decreases your risk of heart attack and stroke and makes your heart stronger.  It helps control your weight and blood pressure; helps you relax and can  improve your mood.  Participate in a regular exercise program.  Talk with your doctor about the best form of exercise for you (dancing, walking, swimming, cycling).  DIET/WEIGHT Goal is to maintain a healthy weight  Your discharge diet is: Dysphagia 3 thin liquids Your height is:  Height: 6' (182.9 cm) Your current weight is: Weight: 72.6 kg (160 lb 0.9 oz) Your Body Mass Index (BMI) is:  BMI (Calculated): 21.8  Following the type of diet specifically designed for you will help prevent another stroke.  Your goal weight range is:    Your goal Body Mass Index (BMI) is 19-24.  Healthy food habits can help reduce 3 risk factors for stroke:  High cholesterol, hypertension, and excess weight.  RESOURCES Stroke/Support Group:  Call [phone]   STROKE EDUCATION PROVIDED/REVIEWED AND GIVEN TO PATIENT Stroke warning signs and symptoms How to activate emergency medical system (call 911). Medications prescribed at discharge. Need for follow-up after discharge. Personal risk factors for stroke. Pneumonia vaccine given: No Flu vaccine given: No My questions have been answered, the writing is legible, and I understand these instructions.  I will adhere to these goals & educational materials that have been provided to me after my discharge from the hospital.

## 2013-06-13 NOTE — Progress Notes (Signed)
Report called to [facility] 

## 2013-06-13 NOTE — Procedures (Signed)
EEG report.  Brief clinical history:  78 years old male who sustained an isolated episode of alteration of consciousness at home. Differential diagnosis is syncope versus seizures. No prior history of frank epileptic seizures.  Technique: this is a 17 channel routine scalp EEG performed at the bedside with bipolar and monopolar montages arranged in accordance to the international 10/20 system of electrode placement. One channel was dedicated to EKG recording.  The study was performed during drowsiness and stage 2 sleep. No activating procedures performed.  Description: As the study begins, patient falls into the drowsy state and rapidly into stage 2 sleep and no wakefulness is recorded which precludes assessment of the background rhythm. Drowsiness and stage 2 sleep exhibit normal architecture without intermixed epileptiform discharges rhythm.  EKG showed sinus rhythm.  Impression: this is a normal drowsy and asleep EEG. Please, be aware that the absence of epileptiform discharges does not exclude the possibility of epilepsy. Clinical correlation is advised.   Wyatt Portela, MD

## 2013-06-15 ENCOUNTER — Non-Acute Institutional Stay (SKILLED_NURSING_FACILITY): Payer: Medicare Other | Admitting: Nurse Practitioner

## 2013-06-15 ENCOUNTER — Encounter: Payer: Self-pay | Admitting: Nurse Practitioner

## 2013-06-15 DIAGNOSIS — M25519 Pain in unspecified shoulder: Secondary | ICD-10-CM

## 2013-06-15 DIAGNOSIS — I10 Essential (primary) hypertension: Secondary | ICD-10-CM

## 2013-06-15 DIAGNOSIS — M1710 Unilateral primary osteoarthritis, unspecified knee: Secondary | ICD-10-CM

## 2013-06-15 DIAGNOSIS — E119 Type 2 diabetes mellitus without complications: Secondary | ICD-10-CM

## 2013-06-15 DIAGNOSIS — M25511 Pain in right shoulder: Secondary | ICD-10-CM

## 2013-06-15 DIAGNOSIS — M6281 Muscle weakness (generalized): Secondary | ICD-10-CM

## 2013-06-15 DIAGNOSIS — R55 Syncope and collapse: Secondary | ICD-10-CM

## 2013-06-15 DIAGNOSIS — R531 Weakness: Secondary | ICD-10-CM

## 2013-06-15 NOTE — Progress Notes (Signed)
Patient ID: Bill Jackson, male   DOB: 23-May-1935, 78 y.o.   MRN: [id number] Nursing Home Location:  [facility]   Place of Service: SNF (31)   Allergies  Allergen Reactions  . Lotensin [Benazepril Hcl]     cough  . Penicillins Other (See Comments)    pt becomes "nervous"    Chief Complaint  Patient presents with  . 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 [name], transferred to CIR then rehab and has been living at home for the past 4 months when he was noted to have residual dizziness, slurred speech, right sided weakness presents with syncope and collapse overnight; pt was then taken to the hospital due to this syncopal episode: Syncope vs seizure; r/o TIA; MRI: no acute infacrts, showed old CVA s/p post changes;  EEG was unremarkable; therapy in the hospital recommenced Rehab therefore he was transferred to [facility] for ongoing PT/OT  Review of Systems:  Review of Systems  Constitutional: Negative for fever, chills and diaphoresis.  HENT: Negative for congestion.   Eyes: Negative for blurred vision and pain.  Respiratory: Negative for cough and shortness of breath.   Cardiovascular: Negative for chest pain, palpitations, orthopnea and leg swelling.  Gastrointestinal: Negative for nausea, vomiting, abdominal pain and constipation.  Genitourinary: Negative for dysuria.  Musculoskeletal: Negative for myalgias.       Right shoulder pain  Skin: Negative for rash.  Neurological: Positive for speech change (slurred speech) and weakness (right sided). Negative for dizziness, focal weakness and headaches.  Psychiatric/Behavioral: Negative for depression and memory loss.     Past Medical History  Diagnosis Date  . Hypertension   . Diabetes mellitus   .  Stroke    Past Surgical History  Procedure Laterality Date  . Heart stents  x3  . Cardiac surgery    . Hernia repair    . Rotator cuff repair    . Craniectomy N/A 10/25/2012    Procedure: Suboccipital Craniectomy for Evacuation of Cerebellar Hematoma;  Surgeon: Maeola Harman, MD;  Location: [facility];  Service: Neurosurgery;  Laterality: N/A;  Suboccipital Craniectomy for Evacuation of Cerebellar Hematoma   Social History:   reports that he quit smoking about 2 months ago. He does not have any smokeless tobacco history on file. He reports that he does not drink alcohol or use illicit drugs.  No family history on file.  Medications: Patient's Medications  New Prescriptions   No medications on file  Previous Medications   BRIMONIDINE (ALPHAGAN) 0.2 % OPHTHALMIC SOLUTION    Place 1 drop into both eyes every 8 (eight) hours.   DORZOLAMIDE-TIMOLOL (COSOPT) 22.3-6.8 MG/ML OPHTHALMIC SOLUTION    Place 1 drop into the right eye 2 (two) times daily.   FOLIC ACID (FOLVITE) 1 MG TABLET    Take 1 tablet (1 mg total) by mouth daily.   GLIPIZIDE (GLUCOTROL) 10 MG TABLET    Take 0.5 tablets (5 mg total) by mouth daily before breakfast.   HYDROCHLOROTHIAZIDE (HYDRODIURIL) 25 MG TABLET    Take 1 tablet (25 mg total) by mouth daily.   HYDROCODONE-ACETAMINOPHEN (NORCO) 5-325 MG PER TABLET    Take one tablet by mouth every four hours as needed for pain   LATANOPROST (XALATAN) 0.005 % OPHTHALMIC  SOLUTION    Place 1 drop into both eyes at bedtime.   LOSARTAN (COZAAR) 100 MG TABLET    Take 50 mg by mouth daily.   MAGNESIUM HYDROXIDE (MILK OF MAGNESIA) 400 MG/5ML SUSPENSION    Take 15 mLs by mouth daily.   MECLIZINE (ANTIVERT) 25 MG TABLET    Take 1 tablet (25 mg total) by mouth 2 (two) times daily.   MULTIPLE VITAMIN (MULTIVITAMIN WITH MINERALS) TABS    Take 1 tablet by mouth daily.   PANTOPRAZOLE (PROTONIX) 40 MG TABLET    Take 1 tablet (40 mg total) by mouth daily.   POTASSIUM CHLORIDE (K-DUR,KLOR-CON)  10 MEQ TABLET    Take 1 tablet (10 mEq total) by mouth daily.   SENNA (SENOKOT) 8.6 MG TABS TABLET    Take 1 tablet (8.6 mg total) by mouth daily.   THIAMINE 100 MG TABLET    Take 1 tablet (100 mg total) by mouth daily.  Modified Medications   No medications on file  Discontinued Medications   INSULIN SYRINGES, DISPOSABLE, U-100 0.3 ML MISC    10 Units by Does not apply route 2 (two) times daily.     Physical Exam: Physical Exam  Vitals reviewed. Constitutional: He is well-developed, well-nourished, and in no distress. No distress.  HENT:  Head: Normocephalic and atraumatic.  Right Ear: External ear normal.  Left Ear: External ear normal.  Eyes: Conjunctivae and EOM are normal. Pupils are equal, round, and reactive to light.  Neck: Normal range of motion. Neck supple. No thyromegaly present.  Cardiovascular: Normal rate, regular rhythm and normal heart sounds.   Pulmonary/Chest: Effort normal and breath sounds normal. No respiratory distress.  Abdominal: Soft. Bowel sounds are normal. He exhibits no distension.  Musculoskeletal: He exhibits tenderness (right shoulder). He exhibits no edema.  Lymphadenopathy:    He has no cervical adenopathy.  Neurological: He is alert.  Right sided weakness  Skin: Skin is warm and dry. He is not diaphoretic.    Filed Vitals:   06/15/13 1559  BP: 118/74  Pulse: 96  Temp: 97.8 F (36.6 C)  Resp: 20      Labs reviewed: Basic Metabolic Panel:  Recent Labs  16/10/96 0450 06/11/13 0400 06/12/13 0600  NA 145 141 141  K 4.4 4.0 3.7  CL 106 104 103  CO2 29 26 29   GLUCOSE 78 70 140*  BUN 17 18 18   CREATININE 1.27 1.36* 1.37*  CALCIUM 9.5 9.6 9.3   Liver Function Tests:  Recent Labs  10/31/12 0500 04/26/13 1059 06/01/13 0450  AST 24 31 16   ALT 27 17 11   ALKPHOS 51 77 57  BILITOT 0.5 0.5 0.3  PROT 6.3 7.6 6.8  ALBUMIN 2.6* 3.8 3.5   No results found for this basename: LIPASE, AMYLASE,  in the last 8760 hours No results  found for this basename: AMMONIA,  in the last 8760 hours CBC:  Recent Labs  04/26/13 1059 06/01/13 0450 06/11/13 0400 06/12/13 0600  WBC 12.7* 5.9 6.5 4.1  NEUTROABS 10.8* 4.1 4.8  --   HGB 13.9 12.7* 12.8* 11.9*  HCT 40.3 37.2* 37.2* 35.0*  MCV 91.2 90.1 89.2 89.7  PLT PLATELETS APPEAR ADEQUATE 279 328 321   Cardiac Enzymes:  Recent Labs  04/26/13 1059 06/01/13 0450 06/11/13 0754 06/11/13 1414 06/11/13 1930  CKTOTAL  --  130  --   --   --   TROPONINI <0.30  --  <0.30 <0.30 <0.30   BNP: No components found  with this basename: POCBNP,  CBG:  Recent Labs  06/12/13 2126 06/13/13 0723 06/13/13 1152  GLUCAP 154* 150* 254*   TSH: No results found for this basename: TSH,  in the last 8760 hours A1C: Lab Results  Component Value Date   HGBA1C 6.2* 06/11/2013   Lipid Panel: No results found for this basename: CHOL, HDL, LDLCALC, TRIG, CHOLHDL, LDLDIRECT,  in the last 8760 hours  Radiological Exams:  Dg Chest 2 View  06/11/2013   CLINICAL DATA:  Status post fall.  Concern for chest injury.  EXAM: CHEST  2 VIEW  COMPARISON:  Chest radiograph performed 04/26/2013  FINDINGS: The lungs are well-aerated and clear. There is no evidence of focal opacification, pleural effusion or pneumothorax.  The heart is normal in size; the mediastinal contour is within normal limits. No acute osseous abnormalities are seen.  IMPRESSION: No acute cardiopulmonary process seen; no displaced rib fractures identified.   Electronically Signed   By: Roanna Raider M.D.   On: 06/11/2013 05:21   Ct Head Wo Contrast  06/11/2013   CLINICAL DATA:  Fall  EXAM: CT HEAD WITHOUT CONTRAST  CT CERVICAL SPINE WITHOUT CONTRAST  TECHNIQUE: Multidetector CT imaging of the head and cervical spine was performed following the standard protocol without intravenous contrast. Multiplanar CT image reconstructions of the cervical spine were also generated.  COMPARISON:  Prior CTs from 06/01/2013 and 11/22/2012.   FINDINGS: CT HEAD FINDINGS  Postoperative changes from prior suboccipital craniotomy seen. Hypodensity with encephalomalacia and gliosis within the right cerebellar hemisphere is stable. Atrophy with chronic microvascular ischemic disease is also unchanged. No acute intracranial hemorrhage or infarct. No mass or midline shift. There is no extra-axial fluid collection. No skull fracture. Paranasal sinuses and mastoid air cells are clear.  Small laceration is noted at the left forehead.  CT CERVICAL SPINE FINDINGS  Dextroscoliosis of the cervical spine with associated multilevel degenerative disc disease is present. Vertebral body heights are preserved. There is no listhesis. Normal C1-2 articulations are intact. No acute fracture or this space.  Visualized soft tissues of the neck are unremarkable. Atherosclerotic calcifications are noted about the carotid bifurcations, right greater than left. Visualized lung apices are clear.  IMPRESSION: CT BRAIN:  Stable appearance of the brain with no acute intracranial process identified.  CT CERVI CAL SPINE:  No acute traumatic injury within the cervical spine.   Electronically Signed   By: Rise Mu M.D.   On: 06/11/2013 05:01   Ct Head Wo Contrast  06/01/2013   CLINICAL DATA:  Generalized weakness. Extremity weakness.  EXAM: CT HEAD WITHOUT CONTRAST  TECHNIQUE: Contiguous axial images were obtained from the base of the skull through the vertex without intravenous contrast.  COMPARISON:  11/22/2012  FINDINGS: Skull and Sinuses:Status post suboccipital craniotomy for treatment of posterior fossa hemorrhage.  Orbits: Bilateral cataract resection.  Brain: No evidence of acute abnormality, such as acute infarction, hemorrhage, hydrocephalus, or mass lesion/mass effect. Unchanged pattern of encephalomalacia and gliosis in the cerebellum, mainly the vermis and right hemisphere, related to previous hemorrhagic stroke. Patchy bilateral cerebral white matter low density  is also stable, consistent with chronic small vessel ischemia.  IMPRESSION: Stable appearance of the brain. No evidence of acute intracranial disease.   Electronically Signed   By: Tiburcio Pea M.D.   On: 06/01/2013 05:42   Ct Cervical Spine Wo Contrast  06/11/2013   CLINICAL DATA:  Fall  EXAM: CT HEAD WITHOUT CONTRAST  CT CERVICAL SPINE WITHOUT CONTRAST  TECHNIQUE:  Multidetector CT imaging of the head and cervical spine was performed following the standard protocol without intravenous contrast. Multiplanar CT image reconstructions of the cervical spine were also generated.  COMPARISON:  Prior CTs from 06/01/2013 and 11/22/2012.  FINDINGS: CT HEAD FINDINGS  Postoperative changes from prior suboccipital craniotomy seen. Hypodensity with encephalomalacia and gliosis within the right cerebellar hemisphere is stable. Atrophy with chronic microvascular ischemic disease is also unchanged. No acute intracranial hemorrhage or infarct. No mass or midline shift. There is no extra-axial fluid collection. No skull fracture. Paranasal sinuses and mastoid air cells are clear.  Small laceration is noted at the left forehead.  CT CERVICAL SPINE FINDINGS  Dextroscoliosis of the cervical spine with associated multilevel degenerative disc disease is present. Vertebral body heights are preserved. There is no listhesis. Normal C1-2 articulations are intact. No acute fracture or this space.  Visualized soft tissues of the neck are unremarkable. Atherosclerotic calcifications are noted about the carotid bifurcations, right greater than left. Visualized lung apices are clear.  IMPRESSION: CT BRAIN:  Stable appearance of the brain with no acute intracranial process identified.  CT CERVI CAL SPINE:  No acute traumatic injury within the cervical spine.   Electronically Signed   By: Rise Mu M.D.   On: 06/11/2013 05:01   Mr Brain Wo Contrast  06/12/2013   CLINICAL DATA:  Syncope with collapse.  EXAM: MRI HEAD WITHOUT  CONTRAST  TECHNIQUE: Multiplanar, multiecho pulse sequences of the brain and surrounding structures were obtained without intravenous contrast.  COMPARISON:  CT head 06/11/2013 Most recent. Also CT head 10/24/2012 and subsequent.  FINDINGS: No acute stroke or acute hemorrhage. No mass lesion or hydrocephalus. No extra-axial fluid.  Moderate cerebral and cerebellar atrophy. Mild subcortical and periventricular T2 and FLAIR hyperintensities, likely chronic microvascular ischemic change.  The patient is status post suboccipital craniectomy on the right for evacuation of a large cerebellar hemorrhage which was acute 10/24/2012. Chronic blood products as well as encephalomalacia can be seen in the right greater than left cerebellar hemispheres and also the vermis. Ex vacuo enlargement of the 4th ventricle dorsally. Small dorsal upper pontine lacune. Prominent perivascular spaces. No large vessel cortical infarct.  Flow voids are maintained throughout the carotid, basilar, and vertebral arteries. There are no other areas of chronic hemorrhage.  Pituitary, pineal, and cerebellar tonsils unremarkable. No upper cervical lesions. Visualized calvarium, skull base, and upper cervical osseous structures unremarkable. Scalp and extracranial soft tissues, orbits, sinuses, and mastoids show no acute process.  IMPRESSION: Chronic changes as described. No acute intracranial abnormality. Good agreement with prior studies.   Electronically Signed   By: Davonna Belling M.D.   On: 06/12/2013 10:58    Assessment/Plan 1. Type 2 diabetes mellitus Question if episode was related to low blood sugar; insulin was stopped and glipized reduced; will monitor cbgs ACHS  2. Hypertension Currently stable on losartan and hctz  3. Syncope No additional episodes in hospital or at [facility]; will cont to monitor; neurology f/u in 2-3 week   4. OA (osteoarthritis) of knee-right -stable on current medications   5. Right sided weakness Due to  CVA; no worsening of symptoms   6. Right shoulder pain due to recent disclocation - s/p fall without syncope 3 weeks ago. Using PRN and therapy to work with pt  7. Hemorrhagic CVA  in March 2014 due to HTN, had acute cerebellar hemorrhage with mass effect on 4th ventricle s/p emergent suboccipital craniectomy for hematoma evacuation by Dr. [name]; following with  neurology

## 2013-06-19 ENCOUNTER — Encounter: Payer: Self-pay | Admitting: Internal Medicine

## 2013-06-19 ENCOUNTER — Non-Acute Institutional Stay (SKILLED_NURSING_FACILITY): Payer: Medicare Other | Admitting: Internal Medicine

## 2013-06-19 DIAGNOSIS — I10 Essential (primary) hypertension: Secondary | ICD-10-CM

## 2013-06-19 DIAGNOSIS — E43 Unspecified severe protein-calorie malnutrition: Secondary | ICD-10-CM

## 2013-06-19 DIAGNOSIS — M25511 Pain in right shoulder: Secondary | ICD-10-CM

## 2013-06-19 DIAGNOSIS — E119 Type 2 diabetes mellitus without complications: Secondary | ICD-10-CM

## 2013-06-19 DIAGNOSIS — I639 Cerebral infarction, unspecified: Secondary | ICD-10-CM

## 2013-06-19 DIAGNOSIS — M25519 Pain in unspecified shoulder: Secondary | ICD-10-CM

## 2013-06-19 DIAGNOSIS — R55 Syncope and collapse: Secondary | ICD-10-CM

## 2013-06-19 DIAGNOSIS — I6350 Cerebral infarction due to unspecified occlusion or stenosis of unspecified cerebral artery: Secondary | ICD-10-CM

## 2013-06-19 NOTE — Assessment & Plan Note (Signed)
Cause of hemmorhagic stroke in March 2014 ;continueLosartan 50 and HCTZ 25

## 2013-06-19 NOTE — Assessment & Plan Note (Signed)
Due to recent dislocation 3 weeks ago drom fall without syncope-pain meds PRN

## 2013-06-19 NOTE — Assessment & Plan Note (Signed)
Balanced diet with supoplement Med-pass

## 2013-06-19 NOTE — Progress Notes (Signed)
MRN: [id number] Name: Bill Jackson  Sex: male Age: 78 y.o. DOB: 02/03/1935  PSC #: [facility] Facility/Room: 216 Level Of Care: SNF Provider: Merrilee Seashore D Emergency Contacts: Extended Emergency Contact Information Primary Emergency Contact: Jacqulyn [place] Home Phone: [phone] Relation: Sister Secondary Emergency Contact: Oatis,Carlos  [place] Home Phone: [phone] Relation: Friend  Code Status: Full  Allergies: Lotensin and Penicillins  Chief Complaint  Patient presents with  . nursing home admission    HPI: Patient is 78 y.o. male who has been living in SNF since hemorrhagic stroke 10/2012 who is admitted after non CVA syncope w/u at hospital- neg.  Past Medical History  Diagnosis Date  . Hypertension   . Diabetes mellitus   . Stroke march 2014    R side weakness;hemorrhagic cerebellar CVA, evacuated    Past Surgical History  Procedure Laterality Date  . Heart stents  x3  . Cardiac surgery    . Hernia repair    . Rotator cuff repair    . Craniectomy N/A 10/25/2012    Procedure: Suboccipital Craniectomy for Evacuation of Cerebellar Hematoma;  Surgeon: Maeola Harman, MD;  Location: [facility];  Service: Neurosurgery;  Laterality: N/A;  Suboccipital Craniectomy for Evacuation of Cerebellar Hematoma      Medication List       This list is accurate as of: 06/19/13  3:43 PM.  Always use your most recent med list.               brimonidine 0.2 % ophthalmic solution  Commonly known as:  ALPHAGAN  Place 1 drop into both eyes every 8 (eight) hours.     dorzolamide-timolol 22.3-6.8 MG/ML ophthalmic solution  Commonly known as:  COSOPT  Place 1 drop into the right eye 2 (two) times daily.     folic acid 1 MG tablet  Commonly known as:  FOLVITE  Take 1 tablet (1 mg total) by mouth daily.     glipiZIDE 10 MG tablet  Commonly known as:  GLUCOTROL  Take 0.5 tablets (5 mg total) by mouth daily before  breakfast.     hydrochlorothiazide 25 MG tablet  Commonly known as:  HYDRODIURIL  Take 1 tablet (25 mg total) by mouth daily.     HYDROcodone-acetaminophen 5-325 MG per tablet  Commonly known as:  NORCO  Take one tablet by mouth every four hours as needed for pain     latanoprost 0.005 % ophthalmic solution  Commonly known as:  XALATAN  Place 1 drop into both eyes at bedtime.     losartan 100 MG tablet  Commonly known as:  COZAAR  Take 50 mg by mouth daily.     meclizine 25 MG tablet  Commonly known as:  ANTIVERT  Take 1 tablet (25 mg total) by mouth 2 (two) times daily.     multivitamin with minerals Tabs tablet  Take 1 tablet by mouth daily.     pantoprazole 40 MG tablet  Commonly known as:  PROTONIX  Take 1 tablet (40 mg total) by mouth daily.     potassium chloride 10 MEQ tablet  Commonly known as:  K-DUR,KLOR-CON  Take 1 tablet (10 mEq total) by mouth daily.     thiamine 100 MG tablet  Take 1 tablet (100 mg total) by mouth daily.        No orders of the defined types were placed in this encounter.     There is no immunization history on file  for this patient.  History  Substance Use Topics  . Smoking status: Former Smoker -- 1.00 packs/day for 25 years    Quit date: 04/03/2013  . Smokeless tobacco: Not on file  . Alcohol Use: No    Family history is noncontributory    Review of Systems  DATA OBTAINED: from patient GENERAL: Feels well no fevers, fatigue, appetite changes SKIN: No itching, rash or wounds EYES: No eye pain, redness, discharge EARS: No earache, tinnitus, change in hearing NOSE: No congestion, drainage or bleeding  MOUTH/THROAT: No mouth or tooth pain, No sore throat, + difficulty chewing or swallowing  RESPIRATORY: No cough, wheezing, SOB CARDIAC: No chest pain, palpitations,+ extremity edema  GI: No abdominal pain, No N/V/D or constipation, No heartburn or reflux  GU: No dysuria, frequency or urgency, or incontinence   MUSCULOSKELETAL: No unrelieved bone/joint pain NEUROLOGIC: No headache, dizziness or focal weakness PSYCHIATRIC: No overt anxiety or sadness. Sleeps well. No behavior issue.   Filed Vitals:   06/19/13 1048  BP: 106/78  Pulse: 62  Temp: 98.3 F (36.8 C)  Resp: 18    Physical Exam  GENERAL APPEARANCE: Alert, conversant. Appropriately groomed. No acute distress.  SKIN: No diaphoresis rash; healing lac L eyebrow;  Sutures removed per ADAMD HEAD: Normocephalic, atraumatic  EYES: Conjunctiva/lids clear. Pupils round, reactive. EOMs intact.  EARS: External exam WNL, canals clear. Hearing grossly normal.  NOSE: No deformity or discharge.  MOUTH/THROAT: Lips w/o lesions. RESPIRATORY: Breathing is even, unlabored. Lung sounds are clear   CARDIOVASCULAR: Heart RRR no murmurs, rubs or gallops. 1+ peripheral edema.  GASTROINTESTINAL: Abdomen is soft, non-tender, not distended w/ normal bowel sounds GENITOURINARY: Bladder non tender, not distended  MUSCULOSKELETAL: No abnormal joints or musculature NEUROLOGIC: Oriented X3.slurred speech, R side paresis PSYCHIATRIC: Mood and affect appropriate to situation, no behavioral issues  Patient Active Problem List   Diagnosis Date Noted  . Protein-calorie malnutrition, severe 06/12/2013  . Syncope 06/11/2013  . Right shoulder pain 11/17/2012  . Right sided weakness 11/17/2012  . Low back pain 11/15/2012  . Glaucoma 11/03/2012  . Headache 10/29/2012  . Cough 10/29/2012  . OA (osteoarthritis) of knee-right 10/28/2012  . Accelerated hypertension 10/28/2012  . Hypokalemia 10/28/2012  . Acute cerebellar hemorrhage 10/28/2012  . Encounter for long-term (current) use of medications 10/28/2012  . Type 2 diabetes mellitus 10/25/2012  . Hypertension 10/25/2012  . Stroke 10/01/2012    CBC    Component Value Date/Time   WBC 4.1 06/12/2013 0600   RBC 3.90* 06/12/2013 0600   HGB 11.9* 06/12/2013 0600   HCT 35.0* 06/12/2013 0600   PLT 321  06/12/2013 0600   MCV 89.7 06/12/2013 0600   LYMPHSABS 1.4 06/11/2013 0400   MONOABS 0.3 06/11/2013 0400   EOSABS 0.0 06/11/2013 0400   BASOSABS 0.0 06/11/2013 0400    CMP     Component Value Date/Time   NA 141 06/12/2013 0600   K 3.7 06/12/2013 0600   CL 103 06/12/2013 0600   CO2 29 06/12/2013 0600   GLUCOSE 140* 06/12/2013 0600   BUN 18 06/12/2013 0600   CREATININE 1.37* 06/12/2013 0600   CALCIUM 9.3 06/12/2013 0600   PROT 6.8 06/01/2013 0450   ALBUMIN 3.5 06/01/2013 0450   AST 16 06/01/2013 0450   ALT 11 06/01/2013 0450   ALKPHOS 57 06/01/2013 0450   BILITOT 0.3 06/01/2013 0450   GFRNONAA 49* 06/12/2013 0600   GFRAA 56* 06/12/2013 0600    Assessment and Plan  Syncope Presentation for most  recent hospitalization-syncope vs seizure vs TIA but MR Iand EEG neg; maybe hypoglycemia? BS have been low  Hypertension Cause of hemmorhagic stroke in March 2014 ;continueLosartan 50 and HCTZ 25  Type 2 diabetes mellitus Recent hypoglycemic episode- d/c insulin and decrease glipizide to 5 mg daily; HbA1c was 6.2; repeat in 1 month  Right shoulder pain Due to recent dislocation 3 weeks ago drom fall without syncope-pain meds PRN  Protein-calorie malnutrition, severe Balanced diet with supoplement Med-pass  Stroke dizzyness and slurred speech in addition to weakness-PT/Ot/ST    Margit Hanks, MD

## 2013-06-19 NOTE — Assessment & Plan Note (Signed)
Recent hypoglycemic episode- d/c insulin and decrease glipizide to 5 mg daily; HbA1c was 6.2; repeat in 1 month

## 2013-06-19 NOTE — Assessment & Plan Note (Signed)
Presentation for most recent hospitalization-syncope vs seizure vs TIA but MR Iand EEG neg; maybe hypoglycemia? BS have been low

## 2013-06-19 NOTE — Assessment & Plan Note (Signed)
dizzyness and slurred speech in addition to weakness-PT/Ot/ST

## 2013-06-20 ENCOUNTER — Ambulatory Visit: Payer: Medicare Other | Admitting: Neurology

## 2013-06-21 ENCOUNTER — Encounter: Payer: Self-pay | Admitting: Neurology

## 2013-06-22 ENCOUNTER — Other Ambulatory Visit (HOSPITAL_COMMUNITY): Payer: Self-pay | Admitting: Internal Medicine

## 2013-06-22 DIAGNOSIS — I639 Cerebral infarction, unspecified: Secondary | ICD-10-CM

## 2013-06-22 DIAGNOSIS — R1310 Dysphagia, unspecified: Secondary | ICD-10-CM

## 2013-06-26 ENCOUNTER — Ambulatory Visit (HOSPITAL_COMMUNITY)
Admission: RE | Admit: 2013-06-26 | Discharge: 2013-06-26 | Disposition: A | Discharge status: Home / Self Care | Payer: Medicare Other | Source: Ambulatory Visit | Attending: Internal Medicine | Admitting: Internal Medicine

## 2013-06-26 DIAGNOSIS — R1312 Dysphagia, oropharyngeal phase: Secondary | ICD-10-CM | POA: Insufficient documentation

## 2013-06-26 DIAGNOSIS — I639 Cerebral infarction, unspecified: Secondary | ICD-10-CM

## 2013-06-26 DIAGNOSIS — R1313 Dysphagia, pharyngeal phase: Secondary | ICD-10-CM | POA: Insufficient documentation

## 2013-06-26 DIAGNOSIS — R1310 Dysphagia, unspecified: Secondary | ICD-10-CM

## 2013-06-26 NOTE — Procedures (Signed)
Objective Swallowing Evaluation: Modified Barium Swallowing Study  [name] MRN: [id number] Date of Birth: November 20, 1934  Today's Date: 06/26/2013 Time: 1100-1130 SLP Time Calculation (min): 30 min  Past Medical History:  Past Medical History  Diagnosis Date  . Hypertension   . Diabetes mellitus   . Stroke march 2014    R side weakness;hemorrhagic cerebellar CVA, evacuated   Past Surgical History:  Past Surgical History  Procedure Laterality Date  . Heart stents  x3  . Cardiac surgery    . Hernia repair    . Rotator cuff repair    . Craniectomy N/A 10/25/2012    Procedure: Suboccipital Craniectomy for Evacuation of Cerebellar Hematoma;  Surgeon: Maeola Harman, MD;  Location: [facility];  Service: Neurosurgery;  Laterality: N/A;  Suboccipital Craniectomy for Evacuation of Cerebellar Hematoma   HPI:  78 y.o. 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 Venetia Jackson, transferred to CIR then rehab and now living at home for the past 4 months, with residual dizziness, slurred speech, right sided weakness, DM, HTN, presents with syncope and collapse overnight.  He has slurred speech and has weakness but appreciates that he is at baseline without new deficits.  Head CT Stable appearance of the brain with no acute intracranial process identified.  CXR No acute cardiopulmonary process seen; no displaced rib fractures identified.  MBS 11/01/12 following CVA recommended Dys. 2 textures with nectar-thick liquids (flash and intermittent penetration from pyriform sinuses), Multiple dry swallows after each bite/sip;Clear throat intermittently;Follow solids with liquid.  One episode reflux observed.  Currently, pt's has remained on nectar thick liquids due to treating SLP's observation of frequent delayed cough  after trials of thin liquids, despite head turn.       Assessment / Plan / Recommendation Clinical Impression  Dysphagia Diagnosis: Mild pharyngeal phase dysphagia Clinical impression: Pt. exhibits a mild oropharyngeal dysphagia characterized by piecemeal swallows with thin liquids, and reduced tongue base contraction to the pharyngeal wall, resulting in residue in the valleculae (with all consistencies) and pyriforms with nectar and thin.  Thin liquids being to spill from the UES posteriorly.  A repeat dry swallow clears most of the residue .  There was only 2 instances of flash/transient penetration, and no aspiration observed.  Pt. would benefit from further swallow therapy to learn compensatory strategies for double swallows with each bite/sip, and a cough or throat clear after liiquids to clear potential penetrated material.  Pt. should be able to be advanced to thin liquids quickly, with these strategies in place.    Treatment Recommendation  Defer treatment plan to SLP at (Comment) (SNF)    Diet Recommendation Dysphagia 3 (Mechanical Soft);Thin liquid   Liquid Administration via: Cup;No straw Medication Administration: Whole meds with puree Supervision: Patient able to self feed;Full supervision/cueing for compensatory strategies Compensations: Slow rate;Small sips/bites;Multiple dry swallows after each bite/sip;Clear throat intermittently;Hard cough after swallow (Cough/clear throat after sips of liquids) Postural Changes and/or Swallow Maneuvers: Seated upright 90 degrees    Other  Recommendations Oral Care Recommendations: Oral care BID Other Recommendations: Clarify dietary restrictions   Follow Up Recommendations  Skilled Nursing facility    Frequency and Duration        Pertinent Vitals/Pain n/a    SLP Swallow Goals  Defer to SNF SLP   General HPI: 78 y.o.  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 Venetia Jackson, transferred to CIR then rehab and now living at home for the past 4 months, with residual dizziness, slurred speech, right sided weakness, DM, HTN, presents with syncope and collapse overnight.  He has slurred speech and has weakness but appreciates that he is at baseline without new deficits.  Head CT Stable appearance of the brain with no acute intracranial process identified.  CXR No acute cardiopulmonary process seen; no displaced rib fractures identified.  MBS 11/01/12 following CVA recommended Dys. 2 textures with nectar-thick liquids (flash and intermittent penetration from pyriform sinuses), Multiple dry swallows after each bite/sip;Clear throat intermittently;Follow solids with liquid.  One episode reflux observed.  Currently, pt's has remained on nectar thick liquids due to treating SLP's observation of frequent delayed cough after trials of thin liquids, despite head turn.   Type of Study: Modified Barium Swallowing Study Reason for Referral: Objectively evaluate swallowing function Previous Swallow Assessment: BSE 06/11/13-Dys 3 thin liquids with head turn to right. Diet Prior to this Study: Regular;Nectar-thick liquids Temperature Spikes Noted: No Respiratory Status: Room air History of Recent Intubation: No Behavior/Cognition: Alert;Cooperative;Pleasant mood Oral Cavity - Dentition: Adequate natural dentition Oral Motor / Sensory Function: Impaired - see Bedside swallow eval Self-Feeding Abilities: Able to feed self Patient Positioning: Upright in chair Baseline Vocal Quality: Clear Volitional Cough: Strong Volitional Swallow: Able to elicit Anatomy: Within functional limits Pharyngeal Secretions: Not observed secondary MBS    Reason for Referral Objectively evaluate swallowing function   Oral Phase Oral Preparation/Oral Phase Oral Phase: Impaired Oral - Thin Oral  - Thin Teaspoon: Piecemeal swallowing Oral - Thin Cup: Piecemeal swallowing Oral - Thin Straw: Not tested Oral - Thin Syringe: Not tested   Pharyngeal Phase Pharyngeal Phase Pharyngeal Phase: Impaired Pharyngeal - Nectar Pharyngeal - Nectar Cup: Reduced tongue base retraction;Pharyngeal residue - valleculae;Pharyngeal residue - pyriform sinuses;Compensatory strategies attempted (Comment) (Double swallows) Pharyngeal - Thin Pharyngeal - Thin Cup: Reduced tongue base retraction;Pharyngeal residue - valleculae;Pharyngeal residue - pyriform sinuses;Compensatory strategies attempted (Comment);Inter-arytenoid space residue (Double swallows) Pharyngeal - Solids Pharyngeal - Puree: Reduced tongue base retraction;Pharyngeal residue - valleculae;Compensatory strategies attempted (Comment) Pharyngeal - Regular: Reduced tongue base retraction;Pharyngeal residue - valleculae;Compensatory strategies attempted (Comment)  Cervical Esophageal Phase    GO    Cervical Esophageal Phase Cervical Esophageal Phase: WFL (Pill lodged briefly in the distal esophagus.)    Functional Assessment Tool Used: clinical judgement Functional Limitations: Swallowing Swallow Current Status (Q4696): At least 1 percent but less than 20 percent impaired, limited or restricted Swallow Goal Status [phone]): At least 1 percent but less than 20 percent impaired, limited or restricted Swallow Discharge Status [phone]): At least 1 percent but less than 20 percent impaired, limited or restricted    Maryjo Rochester T 06/26/2013, 12:28 PM

## 2013-06-27 ENCOUNTER — Non-Acute Institutional Stay (SKILLED_NURSING_FACILITY): Payer: Medicare Other | Admitting: Nurse Practitioner

## 2013-06-27 ENCOUNTER — Encounter: Payer: Self-pay | Admitting: Nurse Practitioner

## 2013-06-27 DIAGNOSIS — E1149 Type 2 diabetes mellitus with other diabetic neurological complication: Secondary | ICD-10-CM

## 2013-06-27 NOTE — Progress Notes (Signed)
Patient ID: Bill Jackson, male   DOB: 10-14-1934, 78 y.o.   MRN: [id number]    Nursing Home Location:  [facility]   Place of Service: SNF (31)  PCP: Pcp Not In System  Allergies  Allergen Reactions  . Lotensin [Benazepril Hcl]     cough  . Penicillins Other (See Comments)    pt becomes "nervous"    Chief Complaint  Patient presents with  . 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 [name] is being seen today due to elevation in blood sugar Pt was previously on 70/30 but this was stopped due to hypoglycemic episodes Now pt is with fasting blood sugars of 180s-250s and blood sugars after meals going well above 400  Review of Systems:  Review of Systems  Constitutional: Negative for diaphoresis.  Eyes: Negative for blurred vision and pain.  Respiratory: Negative for cough and shortness of breath.   Cardiovascular: Negative for chest pain and leg swelling.  Gastrointestinal: Negative for nausea, vomiting, abdominal pain and constipation.  Genitourinary: Negative for dysuria.  Musculoskeletal: Negative for myalgias.       Right shoulder pain  Skin: Negative for rash.  Neurological: Positive for speech change (slurred speech) and weakness (right sided). Negative for dizziness, focal weakness and headaches.  Endo/Heme/Allergies: Negative for polydipsia.     Past Medical History  Diagnosis Date  . Hypertension   . Diabetes mellitus   . Stroke march 2014    R side weakness;hemorrhagic cerebellar CVA, evacuated   Past Surgical History  Procedure Laterality Date  . Heart stents  x3  . Cardiac surgery    . Hernia repair    . Rotator cuff repair    . Craniectomy N/A 10/25/2012    Procedure: Suboccipital Craniectomy for Evacuation of Cerebellar Hematoma;   Surgeon: Maeola Harman, MD;  Location: [facility];  Service: Neurosurgery;  Laterality: N/A;  Suboccipital Craniectomy for Evacuation of Cerebellar Hematoma   Social History:   reports that he quit smoking about 2 months ago. He does not have any smokeless tobacco history on file. He reports that he does not drink alcohol or use illicit drugs.  No family history on file.  Medications: Patient's Medications  New Prescriptions   No medications on file  Previous Medications   BRIMONIDINE (ALPHAGAN) 0.2 % OPHTHALMIC SOLUTION    Place 1 drop into both eyes every 8 (eight) hours.   DORZOLAMIDE-TIMOLOL (COSOPT) 22.3-6.8 MG/ML OPHTHALMIC SOLUTION    Place 1 drop into the right eye 2 (two) times daily.   FOLIC ACID (FOLVITE) 1 MG TABLET    Take 1 tablet (1 mg total) by mouth daily.   GLIPIZIDE (GLUCOTROL) 10 MG TABLET    Take 0.5 tablets (5 mg total) by mouth daily before breakfast.   HYDROCHLOROTHIAZIDE (HYDRODIURIL) 25 MG TABLET    Take 1 tablet (25 mg total) by mouth daily.   HYDROCODONE-ACETAMINOPHEN (NORCO) 5-325 MG PER TABLET    Take one tablet by mouth every four hours as needed for pain   LATANOPROST (XALATAN) 0.005 % OPHTHALMIC SOLUTION    Place 1 drop into both eyes at bedtime.   LOSARTAN (COZAAR) 100 MG TABLET    Take 50 mg by mouth daily.   MECLIZINE (ANTIVERT) 25 MG TABLET  Take 1 tablet (25 mg total) by mouth 2 (two) times daily.   MULTIPLE VITAMIN (MULTIVITAMIN WITH MINERALS) TABS    Take 1 tablet by mouth daily.   PANTOPRAZOLE (PROTONIX) 40 MG TABLET    Take 1 tablet (40 mg total) by mouth daily.   POTASSIUM CHLORIDE (K-DUR,KLOR-CON) 10 MEQ TABLET    Take 1 tablet (10 mEq total) by mouth daily.   THIAMINE 100 MG TABLET    Take 1 tablet (100 mg total) by mouth daily.  Modified Medications   No medications on file  Discontinued Medications   No medications on file     Physical Exam:  Constitutional: He is well-developed, well-nourished, and in no distress. No distress.    HENT:  Head: Normocephalic and atraumatic.  Right Ear: External ear normal.  Left Ear: External ear normal.  Eyes: Conjunctivae and EOM are normal. Pupils are equal, round, and reactive to light.  Neck: Normal range of motion. Neck supple. No thyromegaly present.  Cardiovascular: Normal rate, regular rhythm and normal heart sounds.  Pulmonary/Chest: Effort normal and breath sounds normal. No respiratory distress.  Abdominal: Soft. Bowel sounds are normal. He exhibits no distension.  Neurological: He is alert.  Right sided weakness  Skin: Skin is warm and dry. He is not diaphoretic.  Filed Vitals:   06/27/13 1629  BP: 100/80  Pulse: 100  Temp: 97.6 F (36.4 C)  Resp: 20      Labs reviewed: Basic Metabolic Panel:  Recent Labs  09/81/19 0450 06/11/13 0400 06/12/13 0600  NA 145 141 141  K 4.4 4.0 3.7  CL 106 104 103  CO2 29 26 29   GLUCOSE 78 70 140*  BUN 17 18 18   CREATININE 1.27 1.36* 1.37*  CALCIUM 9.5 9.6 9.3   Liver Function Tests:  Recent Labs  10/31/12 0500 04/26/13 1059 06/01/13 0450  AST 24 31 16   ALT 27 17 11   ALKPHOS 51 77 57  BILITOT 0.5 0.5 0.3  PROT 6.3 7.6 6.8  ALBUMIN 2.6* 3.8 3.5   No results found for this basename: LIPASE, AMYLASE,  in the last 8760 hours No results found for this basename: AMMONIA,  in the last 8760 hours CBC:  Recent Labs  04/26/13 1059 06/01/13 0450 06/11/13 0400 06/12/13 0600  WBC 12.7* 5.9 6.5 4.1  NEUTROABS 10.8* 4.1 4.8  --   HGB 13.9 12.7* 12.8* 11.9*  HCT 40.3 37.2* 37.2* 35.0*  MCV 91.2 90.1 89.2 89.7  PLT PLATELETS APPEAR ADEQUATE 279 328 321   Cardiac Enzymes:  Recent Labs  04/26/13 1059 06/01/13 0450 06/11/13 0754 06/11/13 1414 06/11/13 1930  CKTOTAL  --  130  --   --   --   TROPONINI <0.30  --  <0.30 <0.30 <0.30   BNP: No components found with this basename: POCBNP,  CBG:  Recent Labs  06/12/13 2126 06/13/13 0723 06/13/13 1152  GLUCAP 154* 150* 254*   TSH: No results found  for this basename: TSH,  in the last 8760 hours A1C: Lab Results  Component Value Date   HGBA1C 6.2* 06/11/2013   Lipid Panel: No results found for this basename: CHOL, HDL, LDLCALC, TRIG, CHOLHDL, LDLDIRECT,  in the last 8760 hours   Assessment/Plan 1. Type 2 diabetes mellitus -A1c ordered for dec -cont glipizide  -will start lantus 8 units qhs and have staff cont monitoring CBGs achs -cont facility protocol for blood sugars over 400

## 2013-07-11 ENCOUNTER — Encounter: Payer: Self-pay | Admitting: Nurse Practitioner

## 2013-07-11 ENCOUNTER — Non-Acute Institutional Stay (SKILLED_NURSING_FACILITY): Payer: Medicare Other | Admitting: Nurse Practitioner

## 2013-07-11 DIAGNOSIS — M25519 Pain in unspecified shoulder: Secondary | ICD-10-CM

## 2013-07-11 DIAGNOSIS — I639 Cerebral infarction, unspecified: Secondary | ICD-10-CM

## 2013-07-11 DIAGNOSIS — I10 Essential (primary) hypertension: Secondary | ICD-10-CM

## 2013-07-11 DIAGNOSIS — I6350 Cerebral infarction due to unspecified occlusion or stenosis of unspecified cerebral artery: Secondary | ICD-10-CM

## 2013-07-11 DIAGNOSIS — M25512 Pain in left shoulder: Secondary | ICD-10-CM | POA: Insufficient documentation

## 2013-07-11 DIAGNOSIS — E119 Type 2 diabetes mellitus without complications: Secondary | ICD-10-CM

## 2013-07-11 DIAGNOSIS — E43 Unspecified severe protein-calorie malnutrition: Secondary | ICD-10-CM

## 2013-07-11 NOTE — Progress Notes (Signed)
Patient ID: Bill Jackson, male   DOB: [birth year], 78 y.o.   MRN: [id number]    Nursing Home Location:  [facility]   Place of Service: SNF (31)  PCP: Margit Hanks, MD  Allergies  Allergen Reactions  . Lotensin [Benazepril Hcl]     cough  . Penicillins Other (See Comments)    pt becomes "nervous"    Chief Complaint  Patient presents with  . 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 Patient currently doing well with therapy, now stable to discharge home with home health.  Review of Systems:  Review of Systems  Constitutional: Negative for fever, chills and diaphoresis.  HENT: Negative for congestion.   Eyes: Negative for blurred vision and pain.  Respiratory: Negative for cough and shortness of breath.   Cardiovascular: Negative for chest pain, palpitations, orthopnea and leg swelling.  Gastrointestinal: Negative for nausea, vomiting, abdominal pain and constipation.  Genitourinary: Negative for dysuria.  Musculoskeletal: Positive for joint pain (left shoulder).  Skin: Negative for rash.  Neurological: Positive for speech change (slurred speech) and weakness (right sided). Negative for dizziness, focal weakness and headaches.  Psychiatric/Behavioral: Negative for depression and memory loss.     Past Medical History  Diagnosis Date  . Hypertension   . Diabetes mellitus   . Stroke march 2014    R side weakness;hemorrhagic cerebellar CVA, evacuated   Past Surgical History  Procedure Laterality Date  . Heart stents  x3  . Cardiac surgery    . Hernia repair    . Rotator cuff repair    . Craniectomy N/A 10/25/2012    Procedure: Suboccipital Craniectomy for Evacuation of Cerebellar Hematoma;   Surgeon: Maeola Harman, MD;  Location: [facility];  Service: Neurosurgery;  Laterality: N/A;  Suboccipital Craniectomy for Evacuation of Cerebellar Hematoma   Social History:   reports that he quit smoking about 3 months ago. He does not have any smokeless tobacco history on file. He reports that he does not drink alcohol or use illicit drugs.  No family history on file.  Medications: Patient's Medications  New Prescriptions   No medications on file  Previous Medications   BRIMONIDINE (ALPHAGAN) 0.2 % OPHTHALMIC SOLUTION    Place 1 drop into both eyes every 8 (eight) hours.   DORZOLAMIDE-TIMOLOL (COSOPT) 22.3-6.8 MG/ML OPHTHALMIC SOLUTION    Place 1 drop into the right eye 2 (two) times daily.   FOLIC ACID (FOLVITE) 1 MG TABLET    Take 1 tablet (1 mg total) by mouth daily.   GLIPIZIDE (GLUCOTROL) 10 MG TABLET    Take 0.5 tablets (5 mg total) by mouth daily before breakfast.   HYDROCHLOROTHIAZIDE (HYDRODIURIL) 25 MG TABLET    Take 1 tablet (25 mg total) by mouth daily.   HYDROCODONE-ACETAMINOPHEN (NORCO) 5-325 MG PER TABLET    Take one tablet by mouth every four hours as needed for pain   INSULIN GLARGINE (LANTUS) 100 UNIT/ML INJECTION    Inject 8 Units into the skin at bedtime.   LATANOPROST (XALATAN) 0.005 % OPHTHALMIC SOLUTION    Place 1 drop into both eyes at bedtime.   LOSARTAN (COZAAR) 100 MG TABLET    Take 50 mg by mouth daily.  MECLIZINE (ANTIVERT) 25 MG TABLET    Take 1 tablet (25 mg total) by mouth 2 (two) times daily.   MULTIPLE VITAMIN (MULTIVITAMIN WITH MINERALS) TABS    Take 1 tablet by mouth daily.   PANTOPRAZOLE (PROTONIX) 40 MG TABLET    Take 1 tablet (40 mg total) by mouth daily.   POTASSIUM CHLORIDE (K-DUR,KLOR-CON) 10 MEQ TABLET    Take 1 tablet (10 mEq total) by mouth daily.   THIAMINE 100 MG TABLET    Take 1 tablet (100 mg total) by mouth daily.  Modified Medications   No medications on file  Discontinued Medications   No medications on file     Physical  Exam:  Filed Vitals:   07/11/13 1636  BP: 149/81  Pulse: 74  Temp: 97.4 F (36.3 C)  Resp: 20   Constitutional: He is well-developed, well-nourished, and in no distress. No distress.  HEENT: unremarkable Neck: Normal range of motion. Neck supple. No thyromegaly present.  Cardiovascular: Normal rate, regular rhythm and normal heart sounds.  Pulmonary/Chest: Effort normal and breath sounds normal. No respiratory distress.  Abdominal: Soft. Bowel sounds are normal. He exhibits no distension.  Neurological: He is alert. Slurred speech; right sided weakness MS: limited ROM in left shoulder due to dislocation  Skin: Skin is warm and dry. He is not diaphoretic.    Labs reviewed: Basic Metabolic Panel:  Recent Labs  40/98/11 0450 06/11/13 0400 06/12/13 0600  NA 145 141 141  K 4.4 4.0 3.7  CL 106 104 103  CO2 29 26 29   GLUCOSE 78 70 140*  BUN 17 18 18   CREATININE 1.27 1.36* 1.37*  CALCIUM 9.5 9.6 9.3   Liver Function Tests:  Recent Labs  10/31/12 0500 04/26/13 1059 06/01/13 0450  AST 24 31 16   ALT 27 17 11   ALKPHOS 51 77 57  BILITOT 0.5 0.5 0.3  PROT 6.3 7.6 6.8  ALBUMIN 2.6* 3.8 3.5   No results found for this basename: LIPASE, AMYLASE,  in the last 8760 hours No results found for this basename: AMMONIA,  in the last 8760 hours CBC:  Recent Labs  04/26/13 1059 06/01/13 0450 06/11/13 0400 06/12/13 0600  WBC 12.7* 5.9 6.5 4.1  NEUTROABS 10.8* 4.1 4.8  --   HGB 13.9 12.7* 12.8* 11.9*  HCT 40.3 37.2* 37.2* 35.0*  MCV 91.2 90.1 89.2 89.7  PLT PLATELETS APPEAR ADEQUATE 279 328 321   Cardiac Enzymes:  Recent Labs  04/26/13 1059 06/01/13 0450 06/11/13 0754 06/11/13 1414 06/11/13 1930  CKTOTAL  --  130  --   --   --   TROPONINI <0.30  --  <0.30 <0.30 <0.30   BNP: No components found with this basename: POCBNP,  CBG:  Recent Labs  06/12/13 2126 06/13/13 0723 06/13/13 1152  GLUCAP 154* 150* 254*   TSH: No results found for this basename: TSH,   in the last 8760 hours A1C: Lab Results  Component Value Date   HGBA1C 6.2* 06/11/2013     Assessment/Plan 1. Stroke - with right sided weakness, has done well with rehab.  pt is stable for discharge-will need PT/OT/Nursing per home health. No DME needed. Rx written.  will need to follow up with PCP within 2 weeks.   2. Hypertension -stable on current medications  3. Type 2 diabetes mellitus - blood sugars remain elevated; pt was on 70/30 10 units BID but this was stopped in hospital due to hypoglycemic episode and glipizide decrease from 10 to 5; now  blood sugars are elevated fasting 150s to 200s and postprandial 300-400; will increase lantus to 10 units at this time and will need further evaluation by PCP -cont glipizide 5 mg daily -will have HH nursing due to insulin being restarted  4. Left shoulder pain -LEFT shoulder dislocation with pain medication PRN  5. Protein-calorie malnutrition, severe - improved; will need follow up with PCP

## 2014-02-02 ENCOUNTER — Encounter (HOSPITAL_COMMUNITY): Payer: Self-pay | Admitting: Emergency Medicine

## 2014-02-02 ENCOUNTER — Emergency Department (HOSPITAL_COMMUNITY)
Admission: EM | Admit: 2014-02-02 | Discharge: 2014-02-02 | Disposition: A | Discharge status: Home / Self Care | Payer: Medicare Other | Attending: Emergency Medicine | Admitting: Emergency Medicine

## 2014-02-02 DIAGNOSIS — T85598A Other mechanical complication of other gastrointestinal prosthetic devices, implants and grafts, initial encounter: Secondary | ICD-10-CM

## 2014-02-02 DIAGNOSIS — I10 Essential (primary) hypertension: Secondary | ICD-10-CM | POA: Insufficient documentation

## 2014-02-02 DIAGNOSIS — Z794 Long term (current) use of insulin: Secondary | ICD-10-CM | POA: Insufficient documentation

## 2014-02-02 DIAGNOSIS — E119 Type 2 diabetes mellitus without complications: Secondary | ICD-10-CM | POA: Insufficient documentation

## 2014-02-02 DIAGNOSIS — Z880 Allergy status to penicillin: Secondary | ICD-10-CM | POA: Insufficient documentation

## 2014-02-02 DIAGNOSIS — Z8673 Personal history of transient ischemic attack (TIA), and cerebral infarction without residual deficits: Secondary | ICD-10-CM | POA: Insufficient documentation

## 2014-02-02 DIAGNOSIS — Z79899 Other long term (current) drug therapy: Secondary | ICD-10-CM | POA: Insufficient documentation

## 2014-02-02 DIAGNOSIS — Z87891 Personal history of nicotine dependence: Secondary | ICD-10-CM | POA: Insufficient documentation

## 2014-02-02 DIAGNOSIS — K9423 Gastrostomy malfunction: Secondary | ICD-10-CM | POA: Insufficient documentation

## 2014-02-02 DIAGNOSIS — Z9889 Other specified postprocedural states: Secondary | ICD-10-CM | POA: Insufficient documentation

## 2014-02-02 MED ORDER — ONDANSETRON 4 MG PO TBDP
4.0000 mg | ORAL_TABLET | Freq: Once | ORAL | Status: AC
  Administered 2014-02-02: 4 mg via ORAL
  Filled 2014-02-02: qty 1

## 2014-02-02 MED ORDER — METOCLOPRAMIDE HCL 10 MG PO TABS: 10.0000 mg | ORAL_TABLET | Freq: Four times a day (QID) | ORAL | Status: DC | PRN

## 2014-02-02 NOTE — ED Provider Notes (Signed)
CSN: [id number]     Arrival date & time 02/02/14  1136 History   First MD Initiated Contact with Patient 02/02/14 1150     Chief Complaint  Patient presents with  . feeding tube out    HPI Comments: Patient is a 79 y.o. Male who presents to the [facility] ED with feeding tube problems.  Patient states that he has had issues with his feeding tube function for the past two weeks.  He was able to feed himself up until he had his feeding tube changed by his doctor at the [place] approximately two weeks ago.  The patient states that he can flush the tube fine, but after his afternoon feedings he has a lot of reflux of liquid into the tube and the tube leaks on his clothes.  Patient states that he has not been able to eat today.  Patient had feeding tube placed due to stroke and difficulty swallowing.  [facility] and also at [place].    The history is provided by the patient. No language interpreter was used.    Past Medical History  Diagnosis Date  . Hypertension   . Diabetes mellitus   . Stroke march 2014    R side weakness;hemorrhagic cerebellar CVA, evacuated   Past Surgical History  Procedure Laterality Date  . Heart stents  x3  . Cardiac surgery    . Hernia repair    . Rotator cuff repair    . Craniectomy N/A 10/25/2012    Procedure: Suboccipital Craniectomy for Evacuation of Cerebellar Hematoma;  Surgeon: Maeola Harman, MD;  Location: [facility];  Service: Neurosurgery;  Laterality: N/A;  Suboccipital Craniectomy for Evacuation of Cerebellar Hematoma   History reviewed. No pertinent family history. History  Substance Use Topics  . Smoking status: Former Smoker -- 1.00 packs/day for 25 years    Quit date: 04/03/2013  . Smokeless tobacco: Not on file  . Alcohol Use: No    Review of Systems  Constitutional: Negative for fever, chills and fatigue.  Respiratory: Negative for chest tightness and shortness of breath.   Cardiovascular: Negative for chest  pain, palpitations and leg swelling.  Gastrointestinal: Negative for nausea, vomiting, abdominal pain, diarrhea and constipation.  All other systems reviewed and are negative.     Allergies  Lotensin and Penicillins  Home Medications   Prior to Admission medications   Medication Sig Start Date End Date Taking? Authorizing Provider  acetaminophen (TYLENOL) 325 MG tablet Take 650 mg by mouth every 6 (six) hours as needed for mild pain.   Yes Historical Provider, MD  brimonidine (ALPHAGAN) 0.2 % ophthalmic solution Place 1 drop into both eyes every 8 (eight) hours. 01/19/13  Yes Lucrezia Starch, NP  dorzolamide-timolol (COSOPT) 22.3-6.8 MG/ML ophthalmic solution Place 1 drop into both eyes 2 (two) times daily.   Yes Historical Provider, MD  folic acid (FOLVITE) 1 MG tablet Take 1 tablet (1 mg total) by mouth daily. 01/19/13  Yes Lucrezia Starch, NP  glipiZIDE (GLUCOTROL) 10 MG tablet Take 10 mg by mouth 2 (two) times daily before a meal.   Yes Historical Provider, MD  hydrochlorothiazide (HYDRODIURIL) 25 MG tablet Take 1 tablet (25 mg total) by mouth daily. 01/19/13  Yes Lucrezia Starch, NP  insulin glargine (LANTUS) 100 UNIT/ML injection Inject 16-22 Units into the skin every morning.   Yes Historical Provider, MD  latanoprost (XALATAN) 0.005 % ophthalmic solution Place 1 drop into both eyes at bedtime.  01/19/13  Yes Lucrezia StarchEdwina Berry, NP  losartan (COZAAR) 100 MG tablet Take 50 mg by mouth daily.   Yes Historical Provider, MD  meclizine (ANTIVERT) 25 MG tablet Take 1 tablet (25 mg total) by mouth 2 (two) times daily. 01/19/13  Yes Lucrezia StarchEdwina Berry, NP  Multiple Vitamin (MULTIVITAMIN WITH MINERALS) TABS Take 1 tablet by mouth daily. 01/19/13  Yes Lucrezia StarchEdwina Berry, NP  OVER THE COUNTER MEDICATION Apply 1 application topically daily as needed (foot cream).   Yes Historical Provider, MD  pantoprazole (PROTONIX) 40 MG tablet Take 1 tablet (40 mg total) by mouth daily. 01/19/13  Yes Lucrezia StarchEdwina Berry, NP  metoCLOPramide (REGLAN)  10 MG tablet Take 1 tablet (10 mg total) by mouth every 6 (six) hours as needed (for residuals in feeding tube). 02/02/14   Aarvi Stotts A Forucci, PA-C   BP 103/66  Pulse 77  Temp(Src) 97.4 F (36.3 C) (Oral)  Resp 16  SpO2 98% Physical Exam  Nursing note and vitals reviewed. Constitutional: He is oriented to person, place, and time. He appears well-developed and well-nourished. No distress.  HENT:  Head: Normocephalic and atraumatic.  Mouth/Throat: Oropharynx is clear and moist. No oropharyngeal exudate.  Eyes: No scleral icterus.  Bilateral opacifications of the lenses  Neck: Normal range of motion. Neck supple. No JVD present. No thyromegaly present.  Cardiovascular: Normal rate, regular rhythm, normal heart sounds and intact distal pulses.  Exam reveals no gallop and no friction rub.   No murmur heard. Pulmonary/Chest: Effort normal and breath sounds normal. No respiratory distress. He has no wheezes. He has no rales. He exhibits no tenderness.  Abdominal: Soft. Bowel sounds are normal. He exhibits no distension and no mass. There is no tenderness. There is no rebound and no guarding.  Patient has a PEG tube without surrounding erythema, streaking, or discoloration.  Tube appears to be in place without any drainage from around the stoma.  There is no tenderness to palpation.  There is minimal stomach residuals.    Musculoskeletal: Normal range of motion.  Lymphadenopathy:    He has no cervical adenopathy.  Neurological: He is alert and oriented to person, place, and time.  Skin: Skin is warm and dry. He is not diaphoretic.  Psychiatric: He has a normal mood and affect. His behavior is normal. Judgment and thought content normal.    ED Course  Procedures (including critical care time) Labs Review Labs Reviewed - No data to display  Imaging Review No results found.   EKG Interpretation None      MDM   Final diagnoses:  Feeding tube dysfunction, initial encounter   Patient  is a 79 y.o. Male who presents to the ED with problems with his feeding tube.  Feeding tube is patent and flushes easily here in the ED the patient appears to have difficulty feeding due more to dexterity issues.  Case management was consulted.  The patient already has home health nursing and they are working on obtaining a gravity feed to help with the tube feeding.  I have observed the patient here in the ED performing his own tube feed.  There was no reflux of nutritional supplement or leakage of fluids around the PEG tube.  I have prescribed reglan at this time to aid with possible residuals in the stomach.  The patient was told to follow-up with [facility] doctors [facility] [place].  Case management is going to continue to follow-up with him through the weekend.  His home health nurse is due to  see him tomorrow at his home.  Patient is stable for discharge at this time.  He was encouraged to allow his God-daughter to help with his feedings for increased dexterity.  He agrees to the above plan and states his understanding.      Clydie Braunourtney A Forucci, PA-C 02/02/14 1642

## 2014-02-02 NOTE — ED Notes (Addendum)
PEG tube flushed with 60ccs of tap water. No difficulty, resistance, discharge or pain noted. Patient endorses nausea.  Courtney PA made aware.

## 2014-02-02 NOTE — ED Notes (Signed)
NAD noted. Feeding tube intact and flushed.Pt given discharge instructions. All questions answered. Pt discharged home with family. Pt ambulatory on discharge using his walker.

## 2014-02-02 NOTE — Discharge Instructions (Signed)
Feeding Tube Use °Some people who have trouble swallowing or cannot take food or medicine by mouth may need a feeding tube. A feeding tube can go into the nose and down to the stomach or small bowel or through the skin in the abdomen and into the stomach or small bowel. Liquid food (formula), breast milk, or medicines may be given through the tube.  °SUPPLIES NEEDED FOR A TUBE FEEDING  °· Clean gloves. °· Prescribed formula or breast milk. °· Appropriate feeding bag set, gravity drip tubing set, or 30-60-mL syringe with feeding extension tubing. °· Feeding tube pump or syringe pump as needed. °· Pole as needed. °· 20-60-mL syringe to check tube placement. °· A syringe to flush the feeding tube. °· Container. °· Water. °GIVING A FEEDING THROUGH A FEEDING TUBE PUMP °1. Have all supplies ready and available. °2. Wash hands well. °3. Put on clean gloves. °4. Check the placement of the feeding tube as directed. °5. Elevate the head of the person 30-45 degrees or as directed. °6. Pour up to 4 hours of room-temperature feeding into the feeding bag set. °7. Hang the feeding bag set from a pole. °8. Flush the entire feeding bag set with the feeding. °9. Load the feeding bag set into the feeding tube pump. °10. Cap the feeding bag set. °11. Uncap the feeding tube. °12. Using a syringe, flush the feeding tube with water as directed. °13. Uncap the feeding bag set. °14. Connect the feeding bag set to the feeding tube. °15. Remove gloves. °16. Wash hands well. °17. Set the prescribed feeding rate. °18. Start the feeding tube pump. °GIVING A FEEDING THROUGH A SYRINGE PUMP  °1. Have all supplies ready and available. °2. Wash hands well. °3. Put on clean gloves. °4. Check the placement of the feeding tube as directed. °5. Elevate the head of the person 30-45 degrees or as directed. °6. Draw up to 4 hours of room-temperature formula into the correctly sized syringe. °7. Attach the syringe to the feeding extension tubing. °8. Flush  the entire feeding extension tubing with the feeding. °9. Load the syringe and feeding extension tubing into the syringe pump. °10. Cap the feeding extension tubing. °11. Uncap the feeding tube. °12. Using a syringe, flush the feeding tube with water as directed. °13. Uncap the feeding extension tubing. °14. Connect the feeding extension tubing to the feeding tube. °15. Remove gloves. °16. Wash hands well. °17. Set the prescribed feeding rate. °18. Start the syringe pump. °GIVING A FEEDING BY GRAVITY  °1. Have all supplies ready and available. °2. Wash hands well. °3. Put on clean gloves. °4. Check the placement of the feeding tube as directed. °5. Elevate the head of the person 30-45 degrees or as directed. °6. Close the clamp on the gravity drip tubing set. °7. Pour up to 4 hours of room-temperature feeding into the bag of a gravity drip tubing set. Or, if using a ready-to-hang formula container, connect the gravity drip tubing set to the ready-to-hang container. °8. Hang the feeding with the attached gravity drip tubing set from a pole. °9. Open the roller clamp on the gravity drip tubing to fill the entire tubing with the feeding. °10. Close the roller clamp. °11. Cap the gravity drip tubing. °12. Uncap the feeding tube. °13. Using a syringe, flush the feeding tube with water as directed. °14. Uncap the gravity drip tubing. °15. Connect the gravity drip tubing to the feeding tube. °16. Using the roller clamp, adjust the feeding   rate to deliver the feeding at the prescribed rate. 17. Remove gloves. 18. Wash hands well. GIVING A BOLUS FEEDING THROUGH A FEEDING TUBE Remove the plunger from the syringe. Attach the syringe to the feeding tube. Pour the feeding into the syringe and administer at the prescribed rate by means of gravity. A feeding bag may also be used for bolus feedings, depending on the volume of feeding given.  SUPPLIES NEEDED FOR GIVING MEDICINES THROUGH A FEEDING TUBE  60-mL  syringe.  Container.  Water.  Medicine.  Pill crusher if medicine is in tablet form.  Clean gloves. GIVING MEDICINES THROUGH A FEEDING TUBE  1. Have all supplies ready and available. 2. Wash hands well. 3. If the medicine cannot be given with the feeding, stop the feeding 60 minutes before giving the medicine. 4. If the person needs to take medicine on an empty stomach, consider not giving a feeding for up to 2 hours (hold time) before giving medicine. Talk to the caregiver about appropriate hold times. 5. Fill a container with 50-100 mL of warm water. 6. Prepare medicine that will go into the feeding tube.  Liquid--Measure the prescribed medicine dose.  Tablet--Crush the tablet using a pill-crushing device. Grind the pill to a fine powder. Dissolve the powder in at least 30 mL of warm water or as directed. If there is more than 1 tablet, crush and dilute each one individually.  Capsule--Open a capsule containing dry pellets or pierce a capsule containing liquid (gelcap). Empty the contents in 30 mL of warm water or as directed. Gelcaps can also be dissolved in warm water. 7. Elevate the head of the person 30-45 degrees or as directed. 8. Wash hands well. 9. Put on clean gloves. 10. If a continuous tube feeding is infusing, stop the tube feeding. 11. If applicable, close the tubing clamp. 12. Disconnect the feeding bag set or gravity drip tubing set from the feeding tube. 13. Cap the feeding bag set or gravity drip tubing set. 14. Check the placement of the feeding tube as directed. 15. Draw up 30 mL of water into a syringe or as directed. 16. Insert the tip of the syringe into the feeding tube. 17. Using the syringe, flush the feeding tube with water. 18. Remove the plunger of the syringe or replace the syringe with a syringe that contains medicine. 19. Give the medicine as directed.  If giving only 1 dose of medicine, flush the feeding tube with water after giving the  medicine.  If giving more than 1 dose of medicine, give each separately. Flush the feeding tube between medicines and after the last dose of medicine. 20. If a tube feeding will not be continued after the medicine, cap the feeding tube. Position the person to a comfortable position, but keep his or her head elevated for 1 hour after giving the medicine. 21. If a tube feeding will be continued after the medicine, but the medicine cannot be given with the feeding, continue to hold the feeding for 30-60 minutes after the medicine is given. When appropriate, reconnect the feeding bag set or gravity drip tubing set to the feeding tube. 22. Throw away or clean used supplies as directed. 23. Remove gloves. 24. Wash hands well. Document Released: 07/06/2012 Document Reviewed: 07/06/2012 [facility] Patient Information 2015 [employer], [place]. This information is not intended to replace advice given to you by your health care provider. Make sure you discuss any questions you have with your health care provider.

## 2014-02-02 NOTE — ED Notes (Signed)
Courtney, PA at bedside.  

## 2014-02-02 NOTE — Progress Notes (Signed)
  CARE MANAGEMENT ED NOTE 02/02/2014  Patient:  Bill HanksGALES,[name]   Account Number:  [id number]  Date Initiated:  02/02/2014  Documentation initiated by:  Ferdinand CavaSCHETTINO,Ephrata [place]  Subjective/Objective Assessment:   79 yo male with Medicare presenting to the ED due to concerns with PEG tube     Subjective/Objective Assessment Detail:     Action/Plan:   Will follow up with the patient, goddaughter, and VA physicians on Monday for long term plan.   Action/Plan Detail:   Anticipated DC Date:       Status Recommendation to Physician:   Result of Recommendation:  Agreed    DC Planning Services  Other    Choice offered to / List presented to:            Status of service:  Completed, signed off  ED Comments:   ED Comments Detail:  CM consulted to assist with resources in the community. This CM spoke with the patient and his goddaughter Bill Jackson and they stated that the patient receives medical care through the [place]. The patient stated that he is being [facility]. For his management of the PEG [facility]. On Friday the 26th of July he had a new PEG placed by Dr. Teodoro SprayFrank Pancotta [phone]. The patient verbalized that he has been having a problem with his feeds since the new PEG. He stated that he receives his medical supplies through the [place] and has follow up visits scheduled on July 17th. He also stated that he has HH services through [facility] and provided this CM the [facility]. This CM called and spoke with Ramiro Harvesthris Kennedy at [phone] [facility] and she updated this CM that the patient called her this morning and verbalized difficulty with the PEG tube prior to coming in to the ED. She stated that today is the first time the patient verbalized difficulty with the PEG tube. The patient's goddaughter Bill Jackson stated that she fed the pt via the PEG on this past Tuesday and did not have any difficulty. The  patient is stating that he is having residual feeds come out of the tube. He also stated he is having a BM every other day which has been his regimen since the PEG has been placed and he has been on the tube feeds. Explained that the [place] MD's would need to be followed up with regarding concerns and management of the PEG tube and feedings, although the [facility] offices were closed today. Explained that follow up calls would be made on Monday and this CM would also follow up with the patient and Bill Jackson and contact numbers were confirmed. Updated the ED PA and the EDP with long term plan since all resources in place in the community. No further CM needs identified.

## 2014-02-02 NOTE — ED Provider Notes (Signed)
Medical screening examination/treatment/procedure(s) were conducted as a shared visit with non-physician practitioner(s) and myself.  I personally evaluated the patient during the encounter.   EKG Interpretation None     79yM with difficulties with tube feeds. CVA and arthritis impairing coordination in giving feeds. Having reflux from tubing and not leakage around insertion site. Pt doesn't have enough coordination to drawn up feeds into syringe. Right now very slowly pouring a can into syringe that has plunger removed. Will try to get supplies or arrange for pt to get supplies so can be gravity fed by easier means.   [name], MD 02/02/14 1308

## 2014-02-02 NOTE — ED Notes (Addendum)
RN updated family on plan of care.

## 2014-02-02 NOTE — ED Notes (Signed)
Dr. Kohut at bedside 

## 2014-02-02 NOTE — ED Notes (Signed)
Case management at bedside.

## 2014-02-02 NOTE — ED Notes (Signed)
Per pt sts that his feeding tube is coming out and unable to use. sts he feels weak.

## 2014-02-04 NOTE — ED Provider Notes (Signed)
Medical screening examination/treatment/procedure(s) were conducted as a shared visit with non-physician practitioner(s) and myself.  I personally evaluated the patient during the encounter.   EKG Interpretation None       Raeford RazorStephen Taunja Brickner, MD 02/04/14 1059

## 2014-03-20 ENCOUNTER — Emergency Department (HOSPITAL_COMMUNITY)
Admission: EM | Admit: 2014-03-20 | Discharge: 2014-03-20 | Disposition: A | Discharge status: Home / Self Care | Payer: Medicare Other | Attending: Emergency Medicine | Admitting: Emergency Medicine

## 2014-03-20 ENCOUNTER — Encounter (HOSPITAL_COMMUNITY): Payer: Self-pay | Admitting: Emergency Medicine

## 2014-03-20 DIAGNOSIS — Z9861 Coronary angioplasty status: Secondary | ICD-10-CM | POA: Diagnosis not present

## 2014-03-20 DIAGNOSIS — Z794 Long term (current) use of insulin: Secondary | ICD-10-CM | POA: Insufficient documentation

## 2014-03-20 DIAGNOSIS — E119 Type 2 diabetes mellitus without complications: Secondary | ICD-10-CM | POA: Diagnosis not present

## 2014-03-20 DIAGNOSIS — Z880 Allergy status to penicillin: Secondary | ICD-10-CM | POA: Insufficient documentation

## 2014-03-20 DIAGNOSIS — I10 Essential (primary) hypertension: Secondary | ICD-10-CM | POA: Diagnosis not present

## 2014-03-20 DIAGNOSIS — Z79899 Other long term (current) drug therapy: Secondary | ICD-10-CM | POA: Diagnosis not present

## 2014-03-20 DIAGNOSIS — K9423 Gastrostomy malfunction: Secondary | ICD-10-CM | POA: Insufficient documentation

## 2014-03-20 DIAGNOSIS — Z8673 Personal history of transient ischemic attack (TIA), and cerebral infarction without residual deficits: Secondary | ICD-10-CM | POA: Diagnosis not present

## 2014-03-20 DIAGNOSIS — K9429 Other complications of gastrostomy: Secondary | ICD-10-CM

## 2014-03-20 DIAGNOSIS — Z87891 Personal history of nicotine dependence: Secondary | ICD-10-CM | POA: Insufficient documentation

## 2014-03-20 NOTE — Discharge Instructions (Signed)
Care of a Feeding Tube °People who have trouble swallowing or cannot take food or medicine by mouth are sometimes given feeding tubes. A feeding tube can go into the nose and down to the stomach or through the skin in the abdomen and into the stomach or small bowel. Some of the names of these feeding tubes are gastrostomy tubes, PEG lines, nasogastric tubes, and gastrojejunostomy tubes.  °SUPPLIES NEEDED TO CARE FOR THE TUBE SITE °· Clean gloves. °· Clean wash cloth, gauze pads, or soft paper towel. °· Cotton swabs. °· Skin barrier ointment or cream. °· Soap and water. °· Pre-cut foam pads or gauze (that go around the tube). °· Tube tape. °TUBE SITE CARE °1. Have all supplies ready and available. °2. Wash hands well. °3. Put on clean gloves. °4. Remove the soiled foam pad or gauze, if present, that is found under the tube stabilizer. Change the foam pad or gauze daily or when soiled or moist. °5. Check the skin around the tube site for redness, rash, swelling, drainage, or extra tissue growth. If you notice any of these, call your caregiver. °6. Moisten gauze and cotton swabs with water and soap. °7. Wipe the area closest to the tube (right near the stoma) with cotton swabs. Wipe the surrounding skin with moistened gauze. Rinse with water. °8. Dry the skin and stoma site with a dry gauze pad or soft paper towel. Do not use antibiotic ointments at the tube site. °9. If the skin is red, apply a skin barrier cream or ointment (such as petroleum jelly) in a circular motion, using a cotton swab. The cream or ointment will provide a moisture barrier for the skin and helps with wound healing. °10. Apply a new pre-cut foam pad or gauze around the tube. Secure it with tape around the edges. If no drainage is present, foam pads or gauze may be left off. °11. Use tape or an anchoring device to fasten the feeding tube to the skin for comfort or as directed. Rotate where you tape the tube to avoid skin damage from the  adhesive. °12. Position the person in a semi-upright position (30-45 degree angle). °13. Throw away used supplies. °14. Remove gloves. °15. Wash hands. °SUPPLIES NEEDED TO FLUSH A FEEDING TUBE °· Clean gloves. °· 60 mL syringe (that connects to the feeding tube). °· Towel. °· Water. °FLUSHING A FEEDING TUBE  °1. Have all supplies ready and available. °2. Wash hands well. °3. Put on clean gloves. °4. Draw up 30 mL of water in the syringe. °5. Kink the feeding tube while disconnecting it from the feeding-bag tubing or while removing the plug at the end of the tube. Kinking closes the tube and prevents secretions in the tube from spilling out. °6. Insert the tip of the syringe into the end of the feeding tube. Release the kink. Slowly inject the water. °7. If unable to inject the water, the person with the feeding tube should lay on his or her left side. The tip of the tube may be against the stomach wall, blocking fluid flow. Changing positions may move the tip away from the stomach wall. After repositioning, try injecting the water again. °8. After injecting the water, remove the syringe. °9. Always flush before giving the first medicine, between medicines, and after the final medicine before starting a feeding. This prevents medicines from clogging the tube. °10. Throw away used supplies. °11. Remove gloves. °12. Wash hands. °Document Released: 07/20/2005 Document Revised: 07/06/2012 Document Reviewed: 03/03/2012 °  ExitCare® Patient Information ©2015 [employer], LLC. This information is not intended to replace advice given to you by your health care provider. Make sure you discuss any questions you have with your health care provider. ° °

## 2014-03-20 NOTE — ED Provider Notes (Signed)
I saw and evaluated the patient, reviewed the resident's note and I agree with the findings and plan.   .Face to face Exam:  General:  Awake HEENT:  Atraumatic Resp:  Normal effort Abd:  Nondistended.  Feeding tube in proper position. Neuro:No focal weakness   Nelia Shiobert L Oswin Johal, MD 03/20/14 [phone]

## 2014-03-20 NOTE — ED Provider Notes (Signed)
CSN: [id number]     Arrival date & time 03/20/14  0910 History   None    No chief complaint on file.   HPI  Mr. Bill [place] is a 79 year old male with a history of hypertension, diabetes, CVA (complicated by dysphagia status post G-tube placement) who presents to the emergency department because his G-tube has not been flowing properly. He performs gravity assisted feeds by himself at home, and noticed that during his feet this morning, the feeds stopped flowing. He states that after a few moments, the teaching became disconnected. He has not tried to flush the system himself. Otherwise, he denies any excessive tenderness around the G-tube site, fevers, chills, abdominal pain, vomiting, diarrhea, or constipation.  Patient has had a G-tube for several months since a CVA that caused dysphasia. The tube was last exchanged in July 2015 due to a problem with reflux of feeds out of the tube. Overall, [facility]  Past Medical History  Diagnosis Date  . Hypertension   . Diabetes mellitus   . Stroke march 2014    R side weakness;hemorrhagic cerebellar CVA, evacuated   Past Surgical History  Procedure Laterality Date  . Heart stents  x3  . Cardiac surgery    . Hernia repair    . Rotator cuff repair    . Craniectomy N/A 10/25/2012    Procedure: Suboccipital Craniectomy for Evacuation of Cerebellar Hematoma;  Surgeon: Maeola HarmanJoseph Stern, MD;  Location: [facility];  Service: Neurosurgery;  Laterality: N/A;  Suboccipital Craniectomy for Evacuation of Cerebellar Hematoma   History reviewed. No pertinent family history. History  Substance Use Topics  . Smoking status: Former Smoker -- 1.00 packs/day for 25 years    Quit date: 04/03/2013  . Smokeless tobacco: Not on file  . Alcohol Use: No    Review of Systems  Constitutional: Negative for fever and chills.  Respiratory: Negative for shortness of breath.   Cardiovascular: Negative for chest pain and palpitations.   Gastrointestinal: Negative for nausea, vomiting, abdominal pain, diarrhea and constipation.  Genitourinary: Negative for dysuria and hematuria.      Allergies  Lotensin and Penicillins  Home Medications   Prior to Admission medications   Medication Sig Start Date End Date Taking? Authorizing Provider  acetaminophen (TYLENOL) 325 MG tablet Take 650 mg by mouth every 6 (six) hours as needed for mild pain.    Historical Provider, MD  brimonidine (ALPHAGAN) 0.2 % ophthalmic solution Place 1 drop into both eyes every 8 (eight) hours. 01/19/13   Lucrezia StarchEdwina Berry, NP  dorzolamide-timolol (COSOPT) 22.3-6.8 MG/ML ophthalmic solution Place 1 drop into both eyes 2 (two) times daily.    Historical Provider, MD  folic acid (FOLVITE) 1 MG tablet Take 1 tablet (1 mg total) by mouth daily. 01/19/13   Lucrezia StarchEdwina Berry, NP  glipiZIDE (GLUCOTROL) 10 MG tablet Take 10 mg by mouth 2 (two) times daily before a meal.    Historical Provider, MD  hydrochlorothiazide (HYDRODIURIL) 25 MG tablet Take 1 tablet (25 mg total) by mouth daily. 01/19/13   Lucrezia StarchEdwina Berry, NP  insulin glargine (LANTUS) 100 UNIT/ML injection Inject 16-22 Units into the skin every morning.    Historical Provider, MD  latanoprost (XALATAN) 0.005 % ophthalmic solution Place 1 drop into both eyes at bedtime. 01/19/13   Lucrezia StarchEdwina Berry, NP  losartan (COZAAR) 100 MG tablet Take 50 mg by mouth daily.    Historical Provider, MD  meclizine (ANTIVERT) 25 MG tablet Take 1 tablet (  25 mg total) by mouth 2 (two) times daily. 01/19/13   Lucrezia Starch, NP  metoCLOPramide (REGLAN) 10 MG tablet Take 1 tablet (10 mg total) by mouth every 6 (six) hours as needed (for residuals in feeding tube). 02/02/14   Courtney A Forcucci, PA-C  Multiple Vitamin (MULTIVITAMIN WITH MINERALS) TABS Take 1 tablet by mouth daily. 01/19/13   Lucrezia Starch, NP  OVER THE COUNTER MEDICATION Apply 1 application topically daily as needed (foot cream).    Historical Provider, MD  pantoprazole (PROTONIX) 40 MG  tablet Take 1 tablet (40 mg total) by mouth daily. 01/19/13   Lucrezia Starch, NP   BP 112/68  Pulse 79  Temp(Src) 97.8 F (36.6 C) (Oral)  Resp 18  Ht 6' (1.829 m)  Wt 161 lb (73.029 kg)  BMI 21.83 kg/m2  SpO2 98% Physical Exam  Constitutional: He is oriented to person, place, and time. He appears well-developed and well-nourished. No distress.  Cardiovascular: Normal rate and regular rhythm.  Exam reveals no gallop and no friction rub.   No murmur heard. Pulmonary/Chest: Effort normal and breath sounds normal. He has no wheezes. He has no rales.  Abdominal: Soft. Bowel sounds are normal. He exhibits no distension. There is no tenderness.  G-tube intact along the midline. Dressing clean dry and intact. No evidence of surrounding erythema or leakage  Musculoskeletal: He exhibits no edema.  Neurological: He is alert and oriented to person, place, and time.  Moderate dysphasia, stable.    ED Course  Procedures (including critical care time) Labs Review Labs Reviewed - No data to display  Imaging Review No results found.   EKG Interpretation None      MDM   Final diagnoses:  Gastrostomy tube obstruction    Mr. Bill [place] is a 79 year old with history of diabetes, hypertension, CVA, status post G tube placement for dysphasia) who presents with clogging of his G-tube.   - G-tube flushed successfully, now with tube patency.   - Patient has followup tomorrow at [facility].    Harold Barban, MD 03/20/14 [phone]

## 2014-03-20 NOTE — ED Notes (Signed)
Pt stated peg tube is stopped up and won't hold feedings

## 2014-03-20 NOTE — ED Notes (Signed)
Flushed pt peg tube with 30ml of water. Peg tube flushed with no problems. Educated pt on how to flush peg tube. Pt verbalized clear understanding.

## 2014-05-26 ENCOUNTER — Encounter (HOSPITAL_COMMUNITY): Payer: Self-pay | Admitting: Emergency Medicine

## 2014-05-26 ENCOUNTER — Emergency Department (HOSPITAL_COMMUNITY)
Admission: EM | Admit: 2014-05-26 | Discharge: 2014-05-26 | Disposition: A | Discharge status: Home / Self Care | Payer: Medicare Other | Attending: Emergency Medicine | Admitting: Emergency Medicine

## 2014-05-26 DIAGNOSIS — Z9889 Other specified postprocedural states: Secondary | ICD-10-CM | POA: Diagnosis not present

## 2014-05-26 DIAGNOSIS — Z880 Allergy status to penicillin: Secondary | ICD-10-CM | POA: Insufficient documentation

## 2014-05-26 DIAGNOSIS — L989 Disorder of the skin and subcutaneous tissue, unspecified: Secondary | ICD-10-CM | POA: Diagnosis present

## 2014-05-26 DIAGNOSIS — Z87891 Personal history of nicotine dependence: Secondary | ICD-10-CM | POA: Diagnosis not present

## 2014-05-26 DIAGNOSIS — Z9861 Coronary angioplasty status: Secondary | ICD-10-CM | POA: Insufficient documentation

## 2014-05-26 DIAGNOSIS — L723 Sebaceous cyst: Secondary | ICD-10-CM | POA: Insufficient documentation

## 2014-05-26 DIAGNOSIS — I10 Essential (primary) hypertension: Secondary | ICD-10-CM | POA: Insufficient documentation

## 2014-05-26 DIAGNOSIS — Z8673 Personal history of transient ischemic attack (TIA), and cerebral infarction without residual deficits: Secondary | ICD-10-CM | POA: Insufficient documentation

## 2014-05-26 DIAGNOSIS — E119 Type 2 diabetes mellitus without complications: Secondary | ICD-10-CM | POA: Insufficient documentation

## 2014-05-26 DIAGNOSIS — Z794 Long term (current) use of insulin: Secondary | ICD-10-CM | POA: Diagnosis not present

## 2014-05-26 DIAGNOSIS — Z79899 Other long term (current) drug therapy: Secondary | ICD-10-CM | POA: Diagnosis not present

## 2014-05-26 MED ORDER — AZITHROMYCIN 250 MG PO TABS: 250.0000 mg | ORAL_TABLET | Freq: Every day | ORAL | Status: DC

## 2014-05-26 MED ORDER — LEVOFLOXACIN 250 MG PO TABS: 250.0000 mg | ORAL_TABLET | Freq: Every day | ORAL | Status: DC

## 2014-05-26 MED ORDER — HYDROCODONE-ACETAMINOPHEN 5-325 MG PO TABS: 1.0000 | ORAL_TABLET | ORAL | Status: DC | PRN

## 2014-05-26 NOTE — ED Provider Notes (Signed)
CSN: [id number]     Arrival date & time 05/26/14  1210 History   First MD Initiated Contact with Patient 05/26/14 1325     Chief Complaint  Patient presents with  . Skin Problem   HPI Patient presents to the emergency room with complaints of pain associated with the cyst on his back. He has had this cyst for the past 30 years. Intermittently, over the years it has become inflamed. Over the last couple of days the pain has increased in intensity. It hurts whenever he tries to lie on his back. He has not noticed any fevers. He denies any recent injuries. He denies any drainage. Past Medical History  Diagnosis Date  . Hypertension   . Diabetes mellitus   . Stroke march 2014    R side weakness;hemorrhagic cerebellar CVA, evacuated   Past Surgical History  Procedure Laterality Date  . Heart stents  x3  . Cardiac surgery    . Hernia repair    . Rotator cuff repair    . Craniectomy N/A 10/25/2012    Procedure: Suboccipital Craniectomy for Evacuation of Cerebellar Hematoma;  Surgeon: Maeola HarmanJoseph Stern, MD;  Location: [facility];  Service: Neurosurgery;  Laterality: N/A;  Suboccipital Craniectomy for Evacuation of Cerebellar Hematoma   History reviewed. No pertinent family history. History  Substance Use Topics  . Smoking status: Former Smoker -- 1.00 packs/day for 25 years    Quit date: 04/03/2013  . Smokeless tobacco: Not on file  . Alcohol Use: No    Review of Systems  All other systems reviewed and are negative.     Allergies  Lotensin and Penicillins  Home Medications   Prior to Admission medications   Medication Sig Start Date End Date Taking? Authorizing Provider  acetaminophen (TYLENOL) 325 MG tablet Take 650 mg by mouth every 6 (six) hours as needed for mild pain.    Historical Provider, MD  brimonidine (ALPHAGAN) 0.2 % ophthalmic solution Place 1 drop into both eyes every 8 (eight) hours. 01/19/13   Lucrezia StarchEdwina Berry, NP  dorzolamide-timolol (COSOPT) 22.3-6.8 MG/ML ophthalmic  solution Place 1 drop into both eyes 2 (two) times daily.    Historical Provider, MD  folic acid (FOLVITE) 1 MG tablet Take 1 tablet (1 mg total) by mouth daily. 01/19/13   Lucrezia StarchEdwina Berry, NP  glipiZIDE (GLUCOTROL) 10 MG tablet Take 10 mg by mouth 2 (two) times daily before a meal.    Historical Provider, MD  hydrochlorothiazide (HYDRODIURIL) 25 MG tablet Take 1 tablet (25 mg total) by mouth daily. 01/19/13   Lucrezia StarchEdwina Berry, NP  insulin glargine (LANTUS) 100 UNIT/ML injection Inject 16-22 Units into the skin every morning.    Historical Provider, MD  latanoprost (XALATAN) 0.005 % ophthalmic solution Place 1 drop into both eyes at bedtime. 01/19/13   Lucrezia StarchEdwina Berry, NP  losartan (COZAAR) 100 MG tablet Take 50 mg by mouth daily.    Historical Provider, MD  meclizine (ANTIVERT) 25 MG tablet Take 1 tablet (25 mg total) by mouth 2 (two) times daily. 01/19/13   Lucrezia StarchEdwina Berry, NP  metoCLOPramide (REGLAN) 10 MG tablet Take 1 tablet (10 mg total) by mouth every 6 (six) hours as needed (for residuals in feeding tube). 02/02/14   Courtney A Forcucci, PA-C  Multiple Vitamin (MULTIVITAMIN WITH MINERALS) TABS Take 1 tablet by mouth daily. 01/19/13   Lucrezia StarchEdwina Berry, NP  OVER THE COUNTER MEDICATION Apply 1 application topically daily as needed (foot cream).    Historical Provider, MD  pantoprazole (PROTONIX)  40 MG tablet Take 1 tablet (40 mg total) by mouth daily. 01/19/13   Lucrezia StarchEdwina Berry, NP   BP 99/49  Pulse 74  Temp(Src) 98.2 F (36.8 C) (Oral)  Resp 16  SpO2 96% Physical Exam  Nursing note and vitals reviewed. Constitutional: He appears well-developed and well-nourished. No distress.  HENT:  Head: Normocephalic and atraumatic.  Right Ear: External ear normal.  Left Ear: External ear normal.  Eyes: Conjunctivae are normal. Right eye exhibits no discharge. Left eye exhibits no discharge. No scleral icterus.  Neck: Neck supple. No tracheal deviation present.  Cardiovascular: Normal rate.   Pulmonary/Chest: Effort  normal. No stridor. No respiratory distress.  Musculoskeletal: He exhibits no edema.  2 cysts on the back, one firm without ttp right of midline, larger flat cyst like lesion with induration, no fluctuance midline (2-3 cm in diameter)  Neurological: He is alert. Cranial nerve deficit: no gross deficits.  Mild aphasia   Skin: Skin is warm and dry. No rash noted.  Psychiatric: He has a normal mood and affect.    ED Course  Procedures (including critical care time) Labs Review Labs Reviewed - No data to display  Imaging Review No results found.   EKG Interpretation None      MDM   Final diagnoses:  Sebaceous cyst    Consistent with an inflamed sebaceous cyst.  Doubt that there is an abscess.  Pt has had the lesion for many years.  Recc excision rather than I &D in the ED.  Will rx pain meds.  Monitor for worsening symptoms    Linwood DibblesJon Angelea Penny, MD 05/26/14 [phone]

## 2014-05-26 NOTE — ED Notes (Signed)
Pt states "ive had a cyst on my back for 30 years. Every now and then it gets infected. Its been hurting worse since yesterday. i could hardly lay on my back because it hurt so bad." erythematous papule noted to center mid back

## 2014-05-26 NOTE — ED Notes (Signed)
Called cab for patient who states he will be fine getting from cab to home.  Called cab company to tell them to come to nursing station in lobby to notify of their arrival so that nursing staff can help patient into cab.

## 2014-06-01 ENCOUNTER — Encounter (HOSPITAL_COMMUNITY): Payer: Self-pay | Admitting: Emergency Medicine

## 2014-06-01 ENCOUNTER — Emergency Department (HOSPITAL_COMMUNITY)
Admission: EM | Admit: 2014-06-01 | Discharge: 2014-06-01 | Disposition: A | Discharge status: Home / Self Care | Payer: Medicare Other | Attending: Emergency Medicine | Admitting: Emergency Medicine

## 2014-06-01 DIAGNOSIS — Z87891 Personal history of nicotine dependence: Secondary | ICD-10-CM | POA: Insufficient documentation

## 2014-06-01 DIAGNOSIS — I10 Essential (primary) hypertension: Secondary | ICD-10-CM | POA: Insufficient documentation

## 2014-06-01 DIAGNOSIS — Z880 Allergy status to penicillin: Secondary | ICD-10-CM | POA: Insufficient documentation

## 2014-06-01 DIAGNOSIS — E119 Type 2 diabetes mellitus without complications: Secondary | ICD-10-CM | POA: Diagnosis not present

## 2014-06-01 DIAGNOSIS — L089 Local infection of the skin and subcutaneous tissue, unspecified: Secondary | ICD-10-CM | POA: Diagnosis not present

## 2014-06-01 DIAGNOSIS — Z8673 Personal history of transient ischemic attack (TIA), and cerebral infarction without residual deficits: Secondary | ICD-10-CM | POA: Insufficient documentation

## 2014-06-01 DIAGNOSIS — L02212 Cutaneous abscess of back [any part, except buttock]: Secondary | ICD-10-CM | POA: Diagnosis present

## 2014-06-01 DIAGNOSIS — Z794 Long term (current) use of insulin: Secondary | ICD-10-CM | POA: Diagnosis not present

## 2014-06-01 DIAGNOSIS — Z9861 Coronary angioplasty status: Secondary | ICD-10-CM | POA: Insufficient documentation

## 2014-06-01 DIAGNOSIS — L723 Sebaceous cyst: Secondary | ICD-10-CM | POA: Insufficient documentation

## 2014-06-01 DIAGNOSIS — Z79899 Other long term (current) drug therapy: Secondary | ICD-10-CM | POA: Diagnosis not present

## 2014-06-01 LAB — CBC WITH DIFFERENTIAL/PLATELET
Basophils Absolute: 0 10*3/uL (ref 0.0–0.1)
Basophils Relative: 0 % (ref 0–1)
Eosinophils Absolute: 0 10*3/uL (ref 0.0–0.7)
Eosinophils Relative: 0 % (ref 0–5)
HEMATOCRIT: 35.7 % — AB (ref 39.0–52.0)
HEMOGLOBIN: 12.2 g/dL — AB (ref 13.0–17.0)
LYMPHS ABS: 2.1 10*3/uL (ref 0.7–4.0)
Lymphocytes Relative: 20 % (ref 12–46)
MCH: 30.7 pg (ref 26.0–34.0)
MCHC: 34.2 g/dL (ref 30.0–36.0)
MCV: 89.7 fL (ref 78.0–100.0)
MONO ABS: 0.9 10*3/uL (ref 0.1–1.0)
MONOS PCT: 9 % (ref 3–12)
NEUTROS ABS: 7.5 10*3/uL (ref 1.7–7.7)
NEUTROS PCT: 71 % (ref 43–77)
Platelets: 297 10*3/uL (ref 150–400)
RBC: 3.98 MIL/uL — AB (ref 4.22–5.81)
RDW: 12.9 % (ref 11.5–15.5)
WBC: 10.6 10*3/uL — ABNORMAL HIGH (ref 4.0–10.5)

## 2014-06-01 MED ORDER — HYDROCODONE-ACETAMINOPHEN 5-325 MG PO TABS
1.0000 | ORAL_TABLET | Freq: Once | ORAL | Status: AC
  Administered 2014-06-01: 1 via ORAL
  Filled 2014-06-01: qty 1

## 2014-06-01 MED ORDER — CLINDAMYCIN HCL 150 MG PO CAPS
300.0000 mg | ORAL_CAPSULE | Freq: Once | ORAL | Status: AC
  Administered 2014-06-01: 300 mg via ORAL
  Filled 2014-06-01: qty 2

## 2014-06-01 MED ORDER — CLINDAMYCIN HCL 150 MG PO CAPS: 150.0000 mg | ORAL_CAPSULE | Freq: Four times a day (QID) | ORAL | Status: DC

## 2014-06-01 MED ORDER — HYDROCODONE-ACETAMINOPHEN 5-325 MG PO TABS: 1.0000 | ORAL_TABLET | Freq: Four times a day (QID) | ORAL | Status: DC | PRN

## 2014-06-01 NOTE — ED Notes (Signed)
The pt was seen for an abscess on his back last Saturday and the area has increased in size and is more painful  Since then

## 2014-06-01 NOTE — Discharge Instructions (Signed)
You have an infected sebaceous cyst on your back that will need surgical excision by your surgeon as previously scheduled.  Please follow up promptly for further management.  In the mean time, take antibiotic and pain medication as prescribed.  Return if you develop fever, worsening pain or if you have other concerns.    Cyst Removal Your caregiver has removed a cyst. A cyst is a sac containing a semi-solid material. Cysts may occur any place on your body. They may remain small for years or gradually get larger. A sebaceous cyst is an enlarged (dilated) sweat gland filled with old sweat (sebum). Unattended, these may become large (the size of a softball) over several years time. These are often removed for improved appearance (cosmetic) reasons or before they become infected to form an abscess. An abscess is an infected cyst. HOME CARE INSTRUCTIONS   Keep your bandage clean and dry. You may change your bandage after 24 hours. If your bandage sticks, use warm water to gently loosen it. Pat the area dry with a clean towel before putting on another bandage.  If possible, keep the area where the cyst was removed raised to relieve soreness, swelling, and promote healing.  If you have stitches, keep them clean and dry.  You may clean your stitches gently with a cotton swab dipped in warm soapy water.  Do not soak the area where the cyst was removed or go swimming. You may shower.  Do not overuse the area where your cyst was removed.  Return in 7 days or as directed to have your stitches removed.  Take medicines as instructed by your caregiver. SEEK IMMEDIATE MEDICAL CARE IF:   An oral temperature above 102 F (38.9 C) develops, not controlled by medication.  Blood continues to soak through the bandage.  You have increasing pain in the area where your cyst was removed.  You have redness, swelling, pus, a bad smell, soreness (inflammation), or red streaks coming away from the stitches. These  are signs of infection. MAKE SURE YOU:   Understand these instructions.  Will watch your condition.  Will get help right away if you are not doing well or get worse. Document Released: 07/17/2000 Document Revised: 10/12/2011 Document Reviewed: 11/10/2007 [facility]ExitCare Patient Information 2015 [place], [place]. This information is not intended to replace advice given to you by your health care provider. Make sure you discuss any questions you have with your health care provider.  Epidermal Cyst An epidermal cyst is sometimes called a sebaceous cyst, epidermal inclusion cyst, or infundibular cyst. These cysts usually contain a substance that looks "pasty" or "cheesy" and may have a bad smell. This substance is a protein called keratin. Epidermal cysts are usually found on the face, neck, or trunk. They may also occur in the vaginal area or other parts of the genitalia of both men and women. Epidermal cysts are usually small, painless, slow-growing bumps or lumps that move freely under the skin. It is important not to try to pop them. This may cause an infection and lead to tenderness and swelling. CAUSES  Epidermal cysts may be caused by a deep penetrating injury to the skin or a plugged hair follicle, often associated with acne. SYMPTOMS  Epidermal cysts can become inflamed and cause:  Redness.  Tenderness.  Increased temperature of the skin over the bumps or lumps.  Grayish-white, bad smelling material that drains from the bump or lump. DIAGNOSIS  Epidermal cysts are easily diagnosed by your caregiver during an exam.  Rarely, a tissue sample (biopsy) may be taken to rule out other conditions that may resemble epidermal cysts. TREATMENT   Epidermal cysts often get better and disappear on their own. They are rarely ever cancerous.  If a cyst becomes infected, it may become inflamed and tender. This may require opening and draining the cyst. Treatment with antibiotics may be necessary. When the  infection is gone, the cyst may be removed with minor surgery.  Small, inflamed cysts can often be treated with antibiotics or by injecting steroid medicines.  Sometimes, epidermal cysts become large and bothersome. If this happens, surgical removal in your caregiver's office may be necessary. HOME CARE INSTRUCTIONS  Only take over-the-counter or prescription medicines as directed by your caregiver.  Take your antibiotics as directed. Finish them even if you start to feel better. SEEK MEDICAL CARE IF:   Your cyst becomes tender, red, or swollen.  Your condition is not improving or is getting worse.  You have any other questions or concerns. MAKE SURE YOU:  Understand these instructions.  Will watch your condition.  Will get help right away if you are not doing well or get worse. Document Released: 06/20/2004 Document Revised: 10/12/2011 Document Reviewed: 01/26/2011 [facility] Patient Information 2015 [place], [place]. This information is not intended to replace advice given to you by your health care provider. Make sure you discuss any questions you have with your health care provider.

## 2014-06-01 NOTE — ED Provider Notes (Signed)
CSN: [id number]     Arrival date & time 06/01/14  1915 History   First MD Initiated Contact with Patient 06/01/14 2039     Chief Complaint  Patient presents with  . Abscess     (Consider location/radiation/quality/duration/timing/severity/associated sxs/prior Treatment) HPI  79 year old male presents with an infected sebaceous cyst on his back that has been increasing in size and more painful. Patient has had a sebaceous cyst on his back for more than 30 years. For the past 2 weeks he noticed increasing swelling and pain to the sebaceous cyst on his back. Symptoms is getting progressively worse to the point that he is unable to lay on his back due to the pain. He was seen in the ER 6 days ago for this complaint. He was recommended to be seen by a surgeon for surgical excision. He was prescribed pain medication, and azithromycin which he has been taking without any relief. Patient states he is scheduled to be seen by his surgeon in 3 days however he cannot stand the pain at this time. Patient denies fever, chills, trouble breathing. He is a non-insulin-dependent diabetic who lives at home by himself.  Past Medical History  Diagnosis Date  . Hypertension   . Diabetes mellitus   . Stroke march 2014    R side weakness;hemorrhagic cerebellar CVA, evacuated   Past Surgical History  Procedure Laterality Date  . Heart stents  x3  . Cardiac surgery    . Hernia repair    . Rotator cuff repair    . Craniectomy N/A 10/25/2012    Procedure: Suboccipital Craniectomy for Evacuation of Cerebellar Hematoma;  Surgeon: Maeola HarmanJoseph Stern, MD;  Location: [facility];  Service: Neurosurgery;  Laterality: N/A;  Suboccipital Craniectomy for Evacuation of Cerebellar Hematoma   No family history on file. History  Substance Use Topics  . Smoking status: Former Smoker -- 1.00 packs/day for 25 years    Quit date: 04/03/2013  . Smokeless tobacco: Not on file  . Alcohol Use: No    Review of Systems  All other  systems reviewed and are negative.     Allergies  Lotensin and Penicillins  Home Medications   Prior to Admission medications   Medication Sig Start Date End Date Taking? Authorizing Provider  acetaminophen (TYLENOL) 325 MG tablet Take 650 mg by mouth every 6 (six) hours as needed for mild pain.    Historical Provider, MD  azithromycin (ZITHROMAX) 250 MG tablet Take 1 tablet (250 mg total) by mouth daily. Take first 2 tablets together, then 1 every day until finished. 05/26/14   Linwood DibblesJon Knapp, MD  brimonidine (ALPHAGAN) 0.2 % ophthalmic solution Place 1 drop into both eyes every 8 (eight) hours. 01/19/13   Lucrezia StarchEdwina Berry, NP  dorzolamide-timolol (COSOPT) 22.3-6.8 MG/ML ophthalmic solution Place 1 drop into both eyes 2 (two) times daily.    Historical Provider, MD  folic acid (FOLVITE) 1 MG tablet Take 1 tablet (1 mg total) by mouth daily. 01/19/13   Lucrezia StarchEdwina Berry, NP  glipiZIDE (GLUCOTROL) 10 MG tablet Take 10 mg by mouth 2 (two) times daily before a meal.    Historical Provider, MD  hydrochlorothiazide (HYDRODIURIL) 25 MG tablet Take 1 tablet (25 mg total) by mouth daily. 01/19/13   Lucrezia StarchEdwina Berry, NP  HYDROcodone-acetaminophen (NORCO/VICODIN) 5-325 MG per tablet Take 1-2 tablets by mouth every 4 (four) hours as needed. 05/26/14   Linwood DibblesJon Knapp, MD  insulin glargine (LANTUS) 100 UNIT/ML injection Inject 16-22 Units into the skin every morning.  Historical Provider, MD  latanoprost (XALATAN) 0.005 % ophthalmic solution Place 1 drop into both eyes at bedtime. 01/19/13   Lucrezia StarchEdwina Berry, NP  losartan (COZAAR) 100 MG tablet Take 50 mg by mouth daily.    Historical Provider, MD  meclizine (ANTIVERT) 25 MG tablet Take 1 tablet (25 mg total) by mouth 2 (two) times daily. 01/19/13   Lucrezia StarchEdwina Berry, NP  metoCLOPramide (REGLAN) 10 MG tablet Take 1 tablet (10 mg total) by mouth every 6 (six) hours as needed (for residuals in feeding tube). 02/02/14   Courtney A Forcucci, PA-C  Multiple Vitamin (MULTIVITAMIN WITH MINERALS)  TABS Take 1 tablet by mouth daily. 01/19/13   Lucrezia StarchEdwina Berry, NP  OVER THE COUNTER MEDICATION Apply 1 application topically daily as needed (foot cream).    Historical Provider, MD  pantoprazole (PROTONIX) 40 MG tablet Take 1 tablet (40 mg total) by mouth daily. 01/19/13   Lucrezia StarchEdwina Berry, NP   BP 128/53  Pulse 94  Temp(Src) 97.8 F (36.6 C)  Resp 20  Ht 6\' 3"  (1.905 m)  SpO2 98% Physical Exam  Constitutional: He appears well-developed and well-nourished. No distress.  HENT:  Head: Atraumatic.  Eyes: Conjunctivae are normal.  Neck: Normal range of motion. Neck supple.  Cardiovascular: Normal rate and regular rhythm.   Pulmonary/Chest: Effort normal and breath sounds normal.  Abdominal: Soft. There is no tenderness.  Neurological: He is alert.  Skin: Rash (a large 6 x 8 cm indurated infected sebaceous cyst to mid upper back with surrounding erythema, and small amount of drainage, exquisitely tender to palpation without obvious fluctuance.) noted.  Psychiatric: He has a normal mood and affect.    ED Course  Procedures (including critical care time)  Patient has a large infected sebaceous cyst to his mid upper back that is very tender to palpation with surrounding cellulitic skin changes and some drainage. Patient requests for I&D.  I do not think I&D alone in the ER is sufficient in treating his infection. He likely will benefit from surgical excision. He is afebrile with stable normal vital signs and evidence of systemic infection. He is a diabetic.  9:17 PM Care discussed with Dr. Elesa MassedWard.  Plan to check labs, and will consider admission for cellulitis, likely required surgical excision of large infected sebaceous cyst on back.    9:30 PM Pt at this time prefers to go home instead.  Labs has been drawn but have not resulted.  Plan to d/c with antibiotic, clindamycin, along with pain medication and pt will f/u with surgeon on Monday.    Labs Review Labs Reviewed - No data to  display  Imaging Review No results found.   EKG Interpretation None      MDM   Final diagnoses:  Infected sebaceous cyst of skin    BP 128/53  Pulse 94  Temp(Src) 97.8 F (36.6 C)  Resp 20  Ht 6\' 3"  (1.905 m)  SpO2 98%     Fayrene HelperBowie Jadriel Saxer, PA-C 06/01/14 2135

## 2014-06-02 NOTE — ED Provider Notes (Signed)
Medical screening examination/treatment/procedure(s) were conducted as a shared visit with non-physician practitioner(s) and myself.  I personally evaluated the patient during the encounter.   EKG Interpretation None      Pt is a 79 y.o. M with an infected sebaceous cyst on his back. He was seen several days ago for the same and is on azithromycin. He is here requesting that this cyst to be opened up. He has an appointment in 2 days for excision. Discussed with patient that it is more appropriate that he see his surgeon. Will discharge on clindamycin for broader coverage. Discussed return precautions.  Layla MawKristen N Ward, DO 06/02/14 0002

## 2014-06-22 IMAGING — CT CT HEAD W/O CM
1 of 2 series · 13 of 30 positions shown, 17 images · non-contrast
Comparison: 10/25/2012.

CLINICAL DATA: Follow-up.  The patient agitated.  Recent surgical
decompression.

CT HEAD WITHOUT CONTRAST
TECHNIQUE: Contiguous axial images were obtained from the base of
the skull through the vertex without contrast.

[Series 2: brain · axial · 0.47mm/px · z∈[+143,+273]mm · 13 of 32 slices shown, 17 images]
[im 3/32  brain]
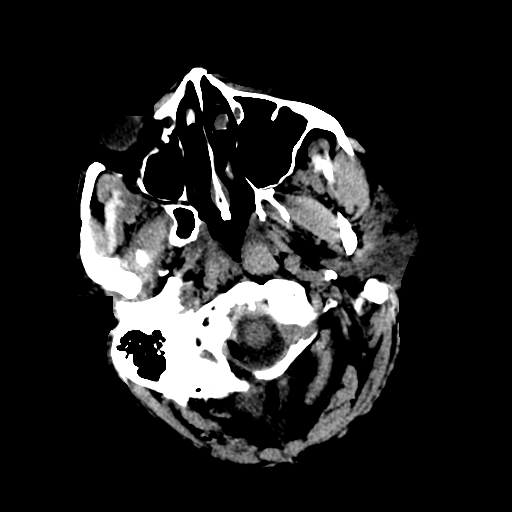
[im 3/32  bone]
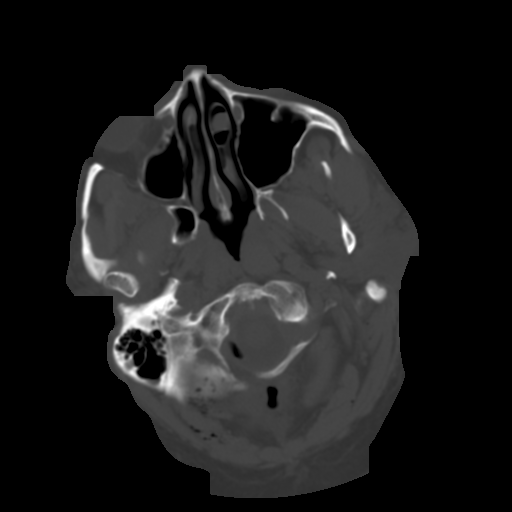
[im 5/32  brain]
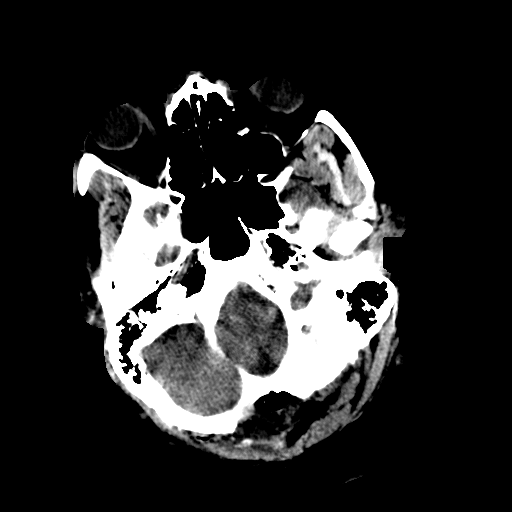
[im 7/32  brain]
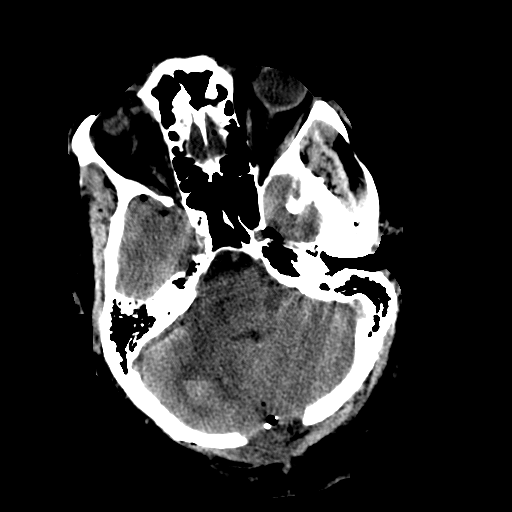
[im 9/32  brain]
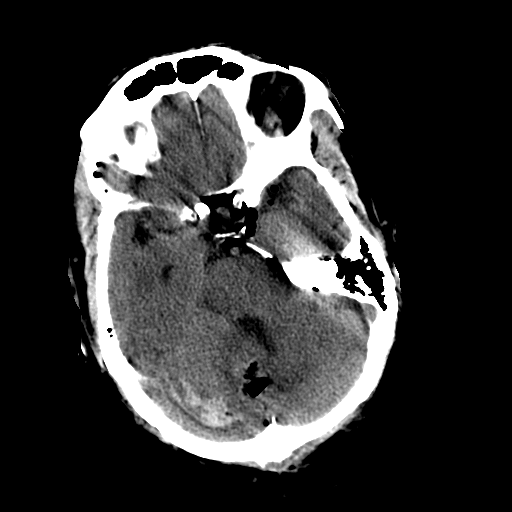
[im 12/32  brain]
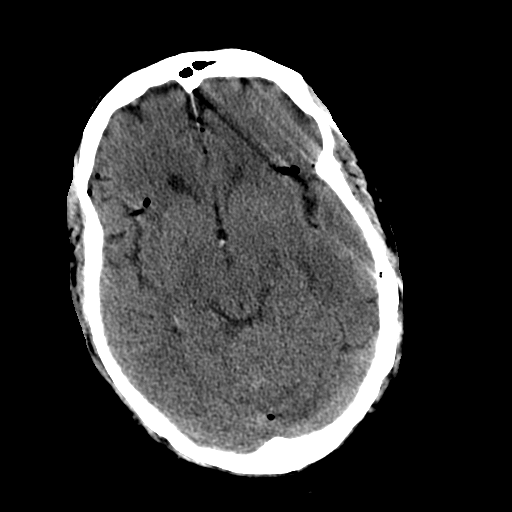
[im 12/32  bone]
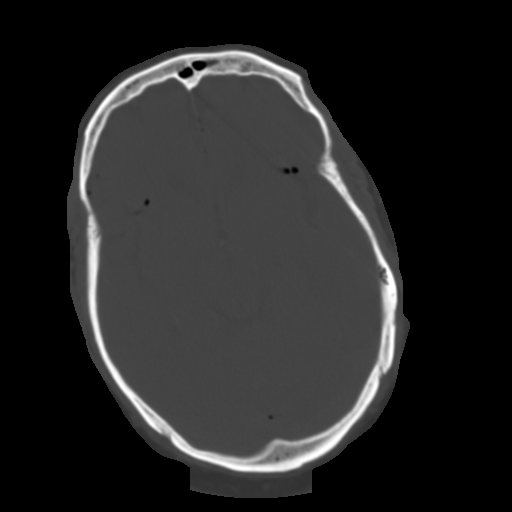
[im 14/32  brain]
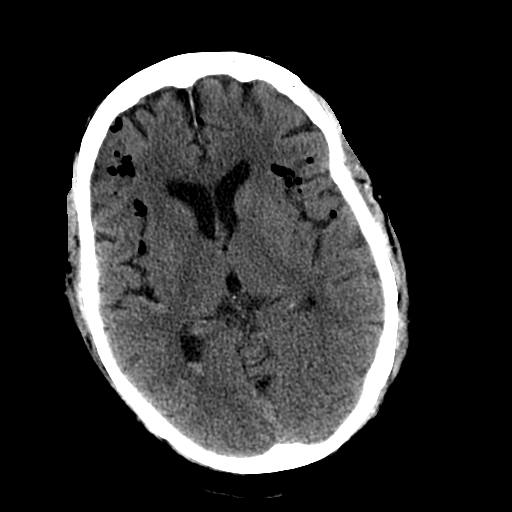
[im 16/32  brain]
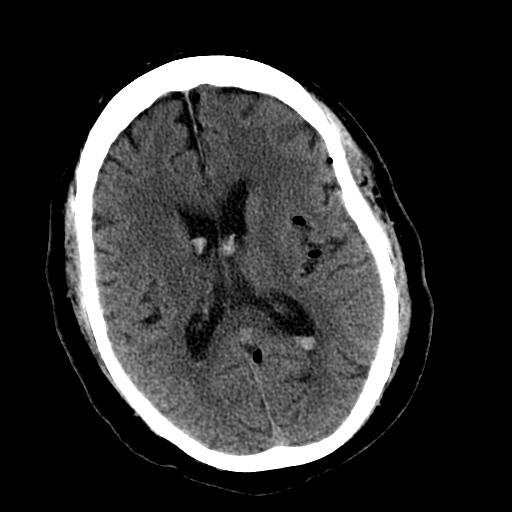
[im 18/32  brain]
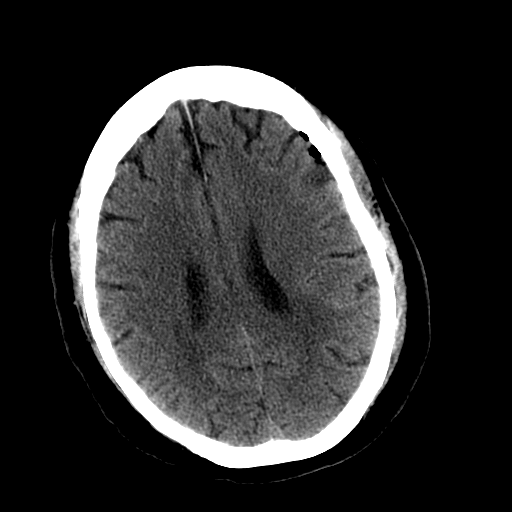
[im 20/32  brain]
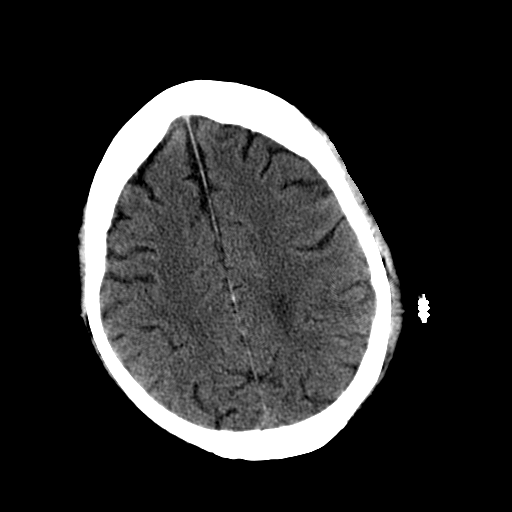
[im 20/32  bone]
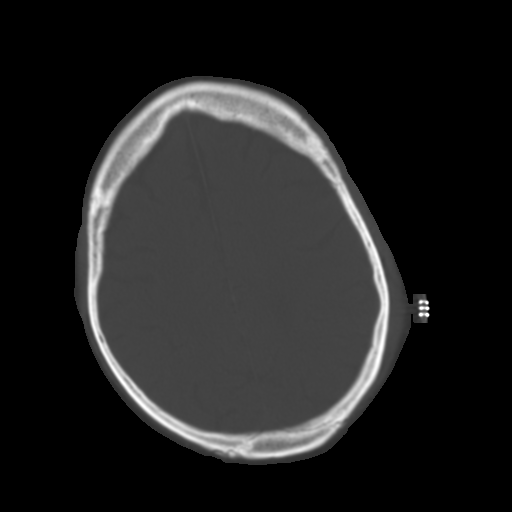
[im 23/32  brain]
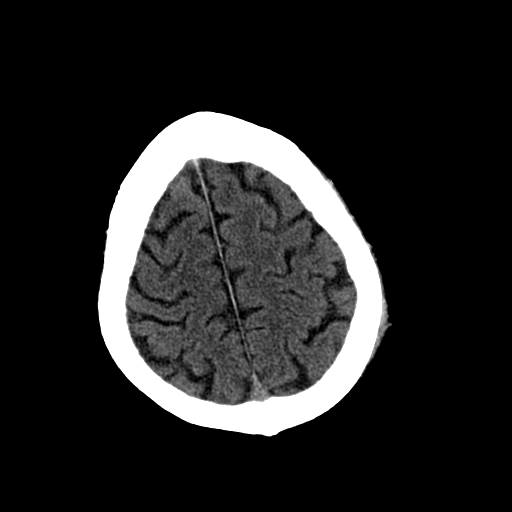
[im 25/32  brain]
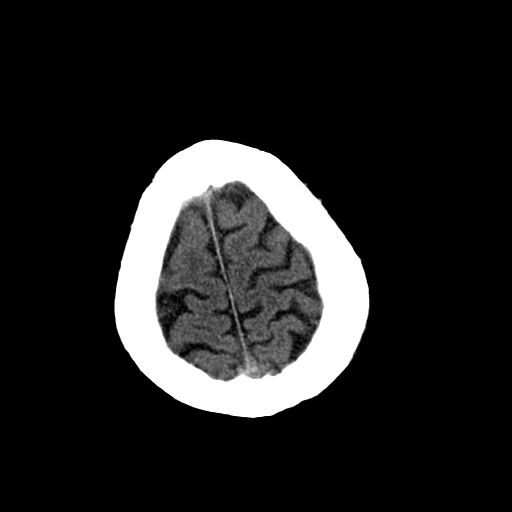
[im 27/32  brain]
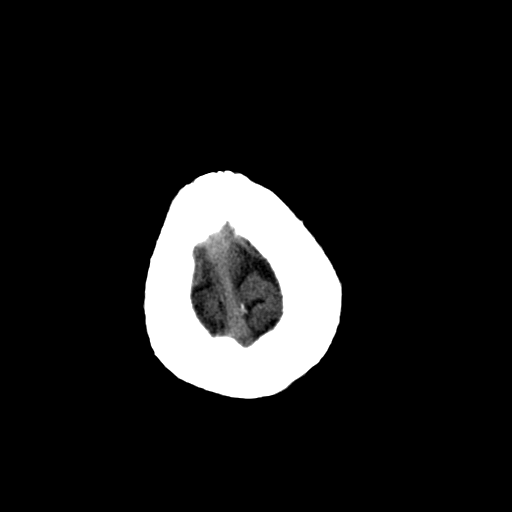
[im 29/32  brain]
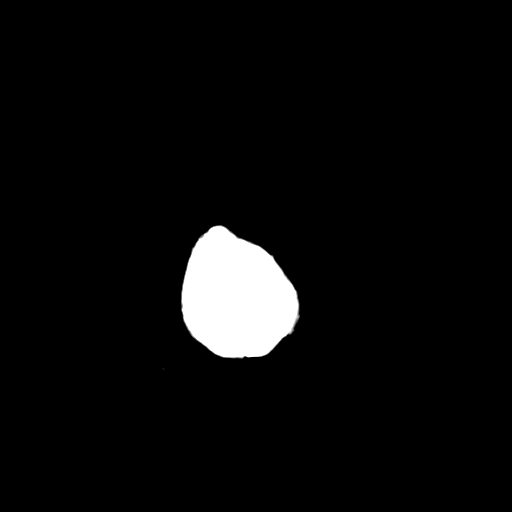
[im 29/32  bone]
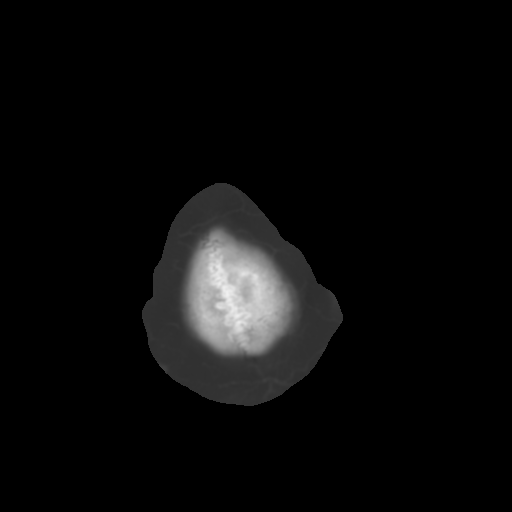

[13 of 30 positions shown; findings below may reference images not displayed]

FINDINGS: Pneumocephalus is present diffusely.  Intraventricular
blood is present bilaterally in the lateral ventricle occipital
horns.  Occipital craniectomy has been performed.  Surgical clips
are present in the craniectomy bed along with gas in the operative
bed.  Evacuation of the majority of the cerebellar hematoma in the
vermis.  Small amount of hemorrhage remains present in the right
cerebellar hemisphere measuring 26 mm x 20 mm.  Mastoid air cells
appear clear.  There is a small amount of intraventricular
hemorrhage casting in the frontal horns of the lateral ventricles
which is new compared to prior.  There is no remote hemorrhage.
IMPRESSION: Postoperative changes with evacuation of most of the cerebellar
hematoma.  Small right cerebellar hematoma measuring 20 mm x 26 mm
persists.  Diffuse pneumocephalus.  More prominent blood in the
ventricles with stable ventricular size.

## 2014-06-27 IMAGING — CR DG LUMBAR SPINE 2-3V
3 series · 3 of 3 positions shown · non-contrast
Comparison: 08/20/2009.

CLINICAL DATA: 78-year-old male with posterior low back pain and
stiffness.

LUMBAR SPINE - 2-3 VIEW

[t lumbar spine ap]
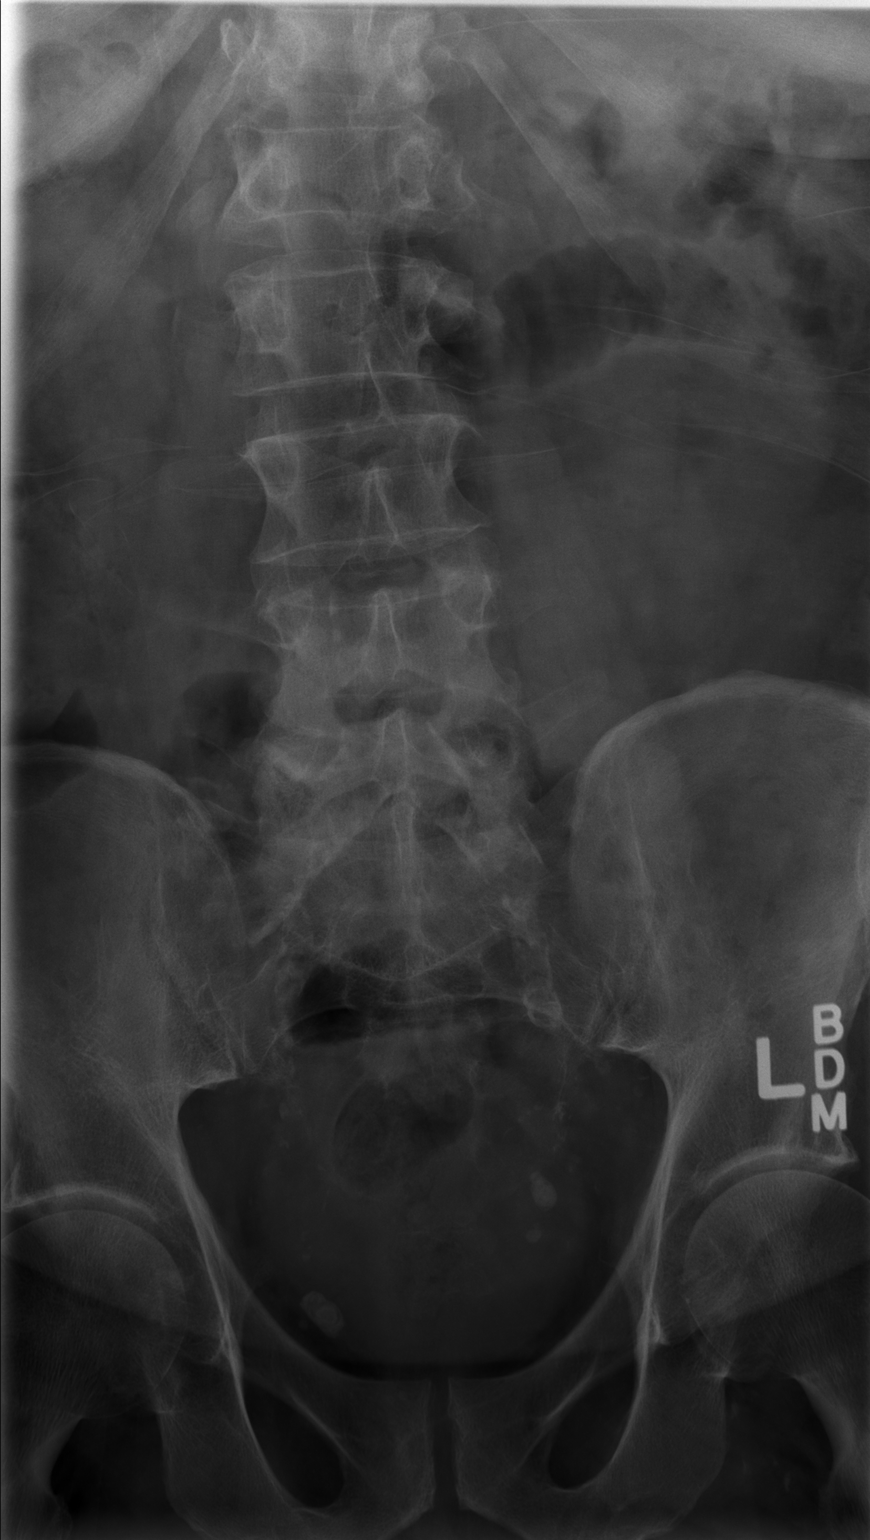

[t lumbar spine lat (1 of 2)]
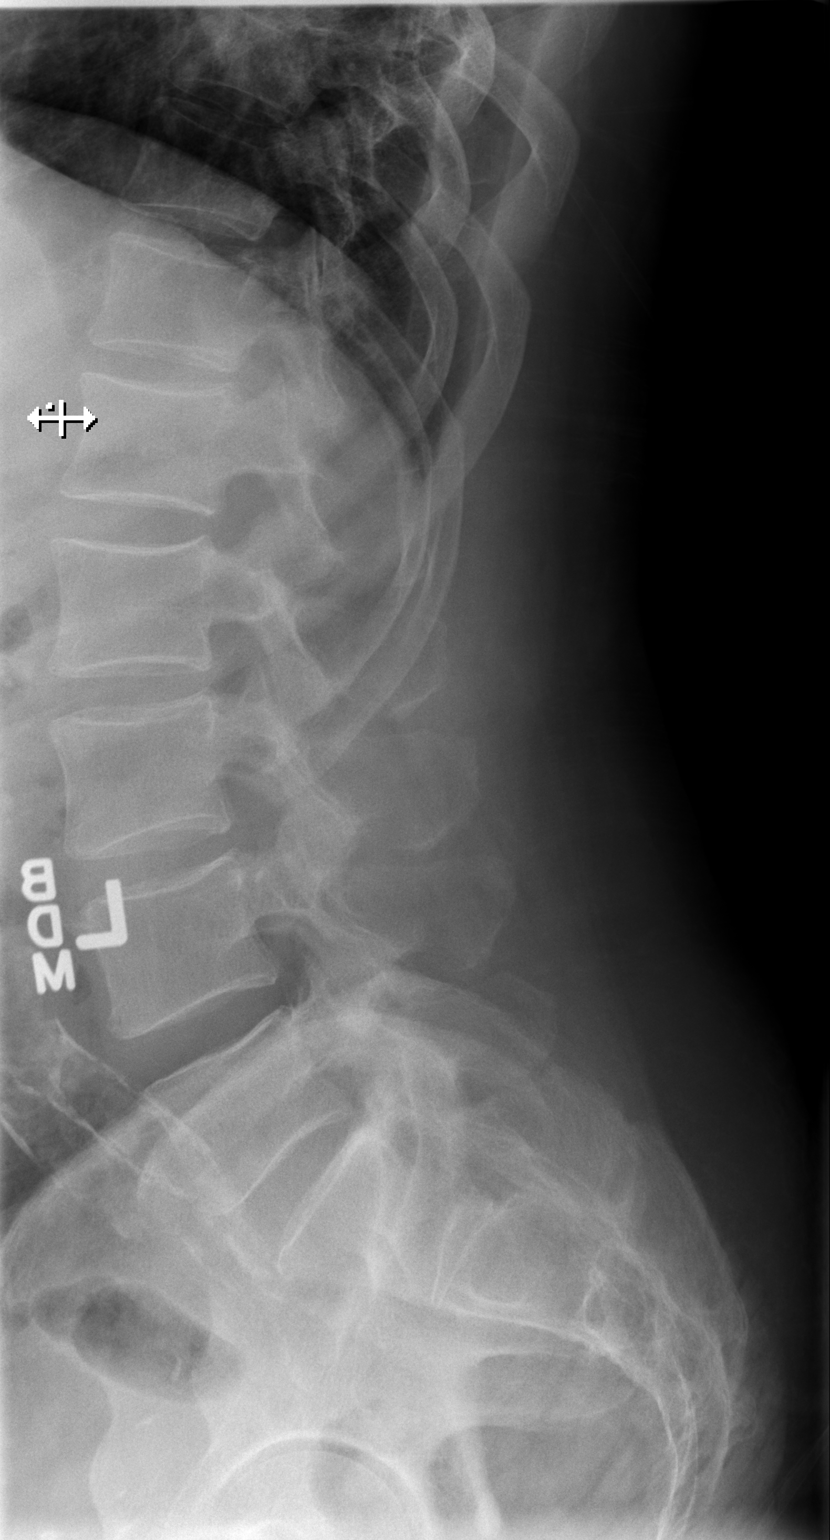

[t lumbar spine lat (2 of 2)]
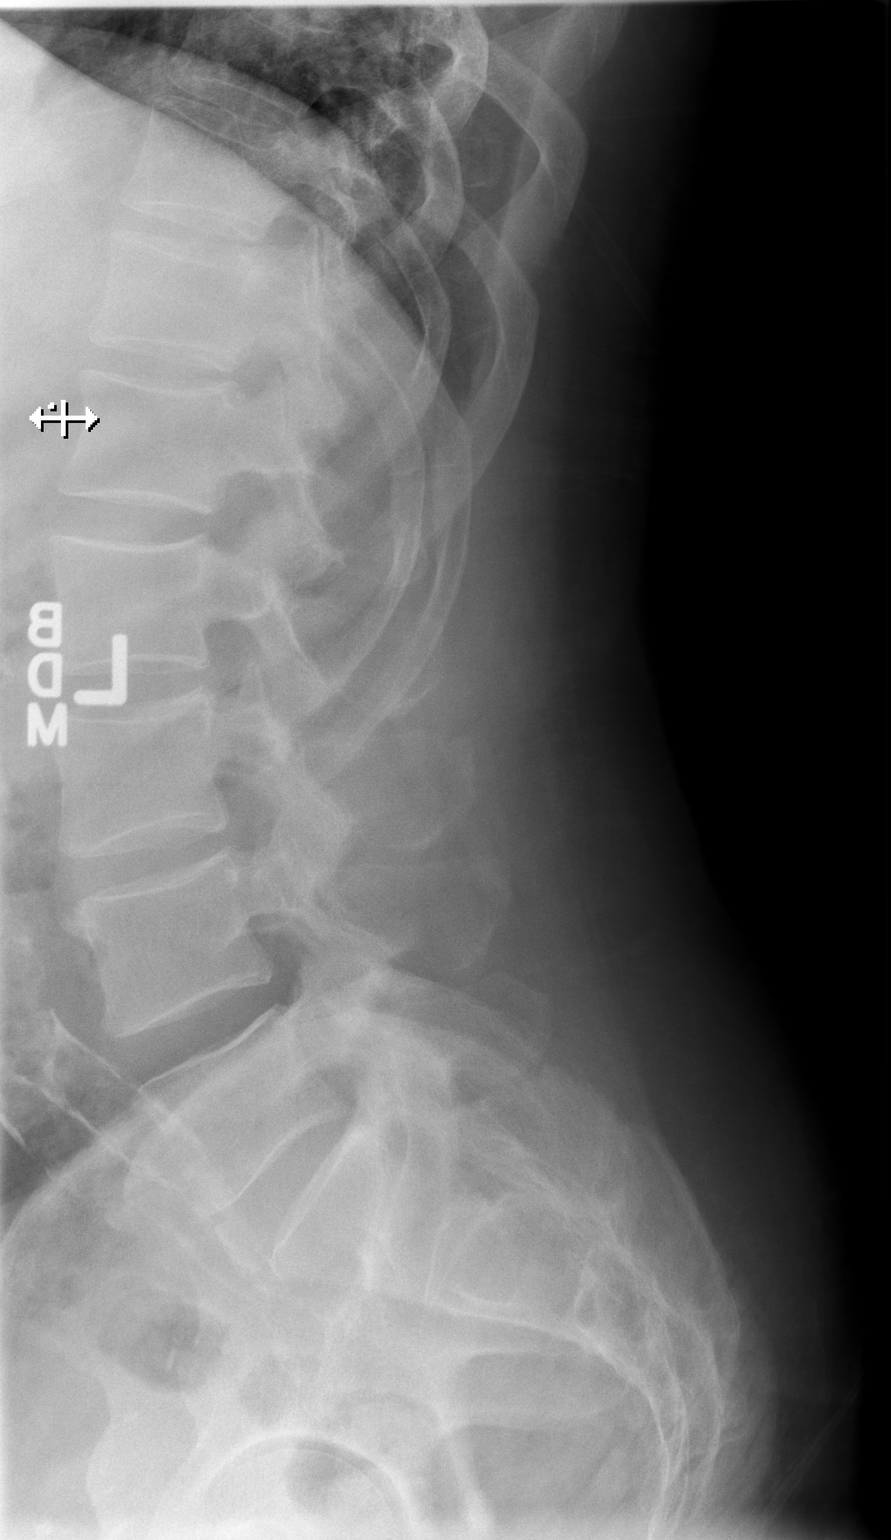

[3 of 3 positions shown; findings below may reference images not displayed]

FINDINGS: Normal lumbar segmentation.  Stable lumbar vertebral
height and alignment.  Stable relatively preserved disc spaces.
Extensive calcified atherosclerosis of the aorta and iliac
arteries.  Stable sacral ala and SI joints.
IMPRESSION: 1. No acute osseous abnormality in the lumbar spine.
2.  Chronic calcified atherosclerosis..

## 2014-09-22 ENCOUNTER — Encounter (HOSPITAL_COMMUNITY): Payer: Self-pay | Admitting: Emergency Medicine

## 2014-09-22 ENCOUNTER — Emergency Department (HOSPITAL_COMMUNITY): Payer: Medicare Other

## 2014-09-22 ENCOUNTER — Emergency Department (HOSPITAL_COMMUNITY)
Admission: EM | Admit: 2014-09-22 | Discharge: 2014-09-22 | Disposition: A | Discharge status: Home / Self Care | Payer: Medicare Other | Attending: Emergency Medicine | Admitting: Emergency Medicine

## 2014-09-22 DIAGNOSIS — Z79899 Other long term (current) drug therapy: Secondary | ICD-10-CM | POA: Insufficient documentation

## 2014-09-22 DIAGNOSIS — E119 Type 2 diabetes mellitus without complications: Secondary | ICD-10-CM | POA: Insufficient documentation

## 2014-09-22 DIAGNOSIS — Y832 Surgical operation with anastomosis, bypass or graft as the cause of abnormal reaction of the patient, or of later complication, without mention of misadventure at the time of the procedure: Secondary | ICD-10-CM | POA: Insufficient documentation

## 2014-09-22 DIAGNOSIS — Z8673 Personal history of transient ischemic attack (TIA), and cerebral infarction without residual deficits: Secondary | ICD-10-CM | POA: Insufficient documentation

## 2014-09-22 DIAGNOSIS — I10 Essential (primary) hypertension: Secondary | ICD-10-CM | POA: Diagnosis not present

## 2014-09-22 DIAGNOSIS — Z880 Allergy status to penicillin: Secondary | ICD-10-CM | POA: Diagnosis not present

## 2014-09-22 DIAGNOSIS — T85598A Other mechanical complication of other gastrointestinal prosthetic devices, implants and grafts, initial encounter: Secondary | ICD-10-CM | POA: Diagnosis present

## 2014-09-22 DIAGNOSIS — Z87891 Personal history of nicotine dependence: Secondary | ICD-10-CM | POA: Insufficient documentation

## 2014-09-22 DIAGNOSIS — Z794 Long term (current) use of insulin: Secondary | ICD-10-CM | POA: Insufficient documentation

## 2014-09-22 DIAGNOSIS — Z431 Encounter for attention to gastrostomy: Secondary | ICD-10-CM

## 2014-09-22 LAB — CBG MONITORING, ED: Glucose-Capillary: 261 mg/dL — ABNORMAL HIGH (ref 70–99)

## 2014-09-22 MED ORDER — IOHEXOL 300 MG/ML  SOLN: 30.0000 mL | Freq: Once | INTRAMUSCULAR | Status: DC | PRN

## 2014-09-22 NOTE — ED Provider Notes (Signed)
CSN: [id number]     Arrival date & time 09/22/14  4098 History   First MD Initiated Contact with Patient 09/22/14 [phone]     Chief Complaint  Patient presents with  . feeding tube came out   (Consider location/radiation/quality/duration/timing/severity/associated sxs/prior Treatment) HPI  Bill Jackson is a 80 yo male presenting with report his feeding tube came out.  He states he jjust finished infusing his 8 am feeding and the tube came out in his hand. He states he has had a the tube for 2 years since his stroke and feeds himself at 8am, 12 pm, 4 pm and 8 pm.  He was not able to see if the balloon was inflated or not.  He reports otherwise no complaints or complications.  He denies any pain, nausea, vomiting or fevers.      Past Medical History  Diagnosis Date  . Hypertension   . Diabetes mellitus   . Stroke march 2014    R side weakness;hemorrhagic cerebellar CVA, evacuated   Past Surgical History  Procedure Laterality Date  . Heart stents  x3  . Cardiac surgery    . Hernia repair    . Rotator cuff repair    . Craniectomy N/A 10/25/2012    Procedure: Suboccipital Craniectomy for Evacuation of Cerebellar Hematoma;  Surgeon: Maeola Harman, MD;  Location: [facility];  Service: Neurosurgery;  Laterality: N/A;  Suboccipital Craniectomy for Evacuation of Cerebellar Hematoma   No family history on file. History  Substance Use Topics  . Smoking status: Former Smoker -- 1.00 packs/day for 25 years    Quit date: 04/03/2013  . Smokeless tobacco: Not on file  . Alcohol Use: No    Review of Systems  Constitutional: Negative for fever.  HENT: Negative for sore throat.   Respiratory: Negative for shortness of breath.   Cardiovascular: Negative for chest pain.  Gastrointestinal: Negative for nausea, vomiting, abdominal pain and diarrhea.       PEG tube came out  Genitourinary: Negative for dysuria.  Musculoskeletal: Negative for myalgias.  Skin: Negative for rash.  Neurological:  Negative for headaches.      Allergies  Lotensin and Penicillins  Home Medications   Prior to Admission medications   Medication Sig Start Date End Date Taking? Authorizing Provider  acetaminophen (TYLENOL) 325 MG tablet Take 650 mg by mouth every 6 (six) hours as needed for mild pain.    Historical Provider, MD  brimonidine (ALPHAGAN) 0.2 % ophthalmic solution Place 1 drop into both eyes every 8 (eight) hours. 01/19/13   Lucrezia Starch, NP  clindamycin (CLEOCIN) 150 MG capsule Take 1 capsule (150 mg total) by mouth every 6 (six) hours. 06/01/14   Fayrene Helper, PA-C  dorzolamide-timolol (COSOPT) 22.3-6.8 MG/ML ophthalmic solution Place 1 drop into both eyes 2 (two) times daily.    Historical Provider, MD  folic acid (FOLVITE) 1 MG tablet Take 1 tablet (1 mg total) by mouth daily. 01/19/13   Lucrezia Starch, NP  glipiZIDE (GLUCOTROL) 10 MG tablet Take 10 mg by mouth 2 (two) times daily before a meal.    Historical Provider, MD  hydrochlorothiazide (HYDRODIURIL) 25 MG tablet Take 1 tablet (25 mg total) by mouth daily. 01/19/13   Lucrezia Starch, NP  HYDROcodone-acetaminophen (NORCO/VICODIN) 5-325 MG per tablet Take 1 tablet by mouth every 6 (six) hours as needed. 06/01/14   Fayrene Helper, PA-C  insulin glargine (LANTUS) 100 UNIT/ML injection Inject 16-22 Units into the skin every morning.    Historical Provider,  MD  latanoprost (XALATAN) 0.005 % ophthalmic solution Place 1 drop into both eyes at bedtime. 01/19/13   Lucrezia Starch, NP  losartan (COZAAR) 100 MG tablet Take 50 mg by mouth daily.    Historical Provider, MD  meclizine (ANTIVERT) 25 MG tablet Take 1 tablet (25 mg total) by mouth 2 (two) times daily. 01/19/13   Lucrezia Starch, NP  metoCLOPramide (REGLAN) 10 MG tablet Take 1 tablet (10 mg total) by mouth every 6 (six) hours as needed (for residuals in feeding tube). 02/02/14   Courtney A Forcucci, PA-C  Multiple Vitamin (MULTIVITAMIN WITH MINERALS) TABS Take 1 tablet by mouth daily. 01/19/13   Lucrezia Starch,  NP  OVER THE COUNTER MEDICATION Apply 1 application topically daily as needed (foot cream).    Historical Provider, MD  pantoprazole (PROTONIX) 40 MG tablet Take 1 tablet (40 mg total) by mouth daily. 01/19/13   Lucrezia Starch, NP   BP 108/64 mmHg  Pulse 79  Temp(Src) 98.1 F (36.7 C) (Oral)  Resp 16  Ht 6' (1.829 m)  Wt 175 lb (79.379 kg)  BMI 23.73 kg/m2  SpO2 98% Physical Exam  Constitutional: He appears well-developed and well-nourished. No distress.  HENT:  Head: Normocephalic and atraumatic.  Mouth/Throat: Oropharynx is clear and moist. No oropharyngeal exudate.  Eyes: Conjunctivae are normal.  Neck: Neck supple. No thyromegaly present.  Cardiovascular: Normal rate, regular rhythm and intact distal pulses.   Pulmonary/Chest: Effort normal and breath sounds normal. No respiratory distress. He has no wheezes. He has no rales. He exhibits no tenderness.  Abdominal: Soft. There is no tenderness. There is no rigidity, no rebound, no guarding, no tenderness at McBurney's point and negative Murphy's sign.    Musculoskeletal: He exhibits no tenderness.  Lymphadenopathy:    He has no cervical adenopathy.  Neurological: He is alert.  Skin: Skin is warm and dry. No rash noted. He is not diaphoretic.  Psychiatric: He has a normal mood and affect.  Nursing note and vitals reviewed.   ED Course  Procedures (including critical care time)   Labs Review Labs Reviewed  CBG MONITORING, ED - Abnormal; Notable for the following:    Glucose-Capillary 261 (*)    All other components within normal limits    Imaging Review Dg Abd Acute W/chest  09/22/2014   CLINICAL DATA:  Status post re- insertion of feeding tube.  EXAM: ACUTE ABDOMEN SERIES (ABDOMEN 2 VIEW & CHEST 1 VIEW)  COMPARISON:  None.  FINDINGS: Gastrostomy tube is in the projection of the left upper quadrant. There is no evidence of dilated bowel loops or free intraperitoneal air. No radiopaque calculi or other significant  radiographic abnormality is seen. Heart size and mediastinal contours are within normal limits. Both lungs are clear.  IMPRESSION: 1. Gastrostomy tube noted in the projection of the left upper quadrant. If intraluminal confirmation is desired then recommend injection of a small amount of water-soluble contrast material through the gastrostomy tube.   Electronically Signed   By: Signa Kell M.D.   On: 09/22/2014 11:34     EKG Interpretation None      MDM   Final diagnoses:  PEG (percutaneous endoscopic gastrostomy) adjustment/replacement/removal   80 yo with no other complaints or symptoms other than PEG tube came out this am after am feeding.  Discussed case with Dr. Clarene Duke. 14 fr catheter inserted without difficulty into PEG ostomy, subsequent tube feed and stomach contents noted in tube.  10ml of saline instilled in balloon.  Acute abd  ordered to verify placement. Xray had to be repeated due to visualization of contrast, however when resulted noted percutaneous gastric tube in place with no evidence of extravasation. Discussed findings with pt and family.  Instructed pt to follow-up with his doctor on Monday for replacement of temporary tube. Pt is well-appearing, in no acute distress and vital signs reviewed and not concerning. He appears safe to be discharged.  Return precautions provided. Pt aware of plan and in agreement.     Filed Vitals:   09/22/14 1030 09/22/14 1118 09/22/14 1130 09/22/14 1200  BP: 110/65 107/66 108/68 107/62  Pulse: 77 64 56 71  Temp:      TempSrc:      Resp:  18    Height:      Weight:      SpO2: 96% 97% 96% 99%   Meds given in ED:  Medications - No data to display  Discharge Medication List as of 09/22/2014  3:19 PM         Harle BattiestElizabeth Daveon Arpino, NP 09/22/14 2130  Samuel JesterKathleen McManus, DO 09/25/14 [phone]

## 2014-09-22 NOTE — ED Notes (Addendum)
[facility] with c/o his feeding tube coming out. Pt has had a feeding tube x2  Years and this morning he went to use it and it came out.  BP-124/78 HR-90 Pt is also hard of hearing.

## 2014-09-22 NOTE — ED Notes (Signed)
Patient on their to Xray.  Family member waiting in Patient room.

## 2014-09-22 NOTE — ED Notes (Signed)
Assisted PA with foley to replace a feeding tube that came out, temporary.

## 2014-09-22 NOTE — Discharge Instructions (Signed)
Please follow directions provided. Be sure to follow up with your primary care doctor to have your PEG tube replaced. You may safely use the temporary tube for feedings. Keep the skin around the PEG tube clean and dry.P Don't hesitate to return for any new, worsening, or concerning symptoms.   SEEK IMMEDIATE MEDICAL CARE IF:  You have severe abdominal pain, tenderness, or abdominal bloating (distension).  You have nausea or vomiting.  You are constipated or have problems moving your bowels.  The G-tube insertion site is red, swollen, has a foul smell, or has yellow or brown drainage.  You have difficulty breathing or shortness of breath.  You have a fever.  You have a large amount of feeding tube residuals.  The G-tube is clogged and cannot be flushed.

## 2014-09-22 NOTE — ED Notes (Signed)
Patient is still in Xray. 

## 2014-09-24 ENCOUNTER — Encounter (HOSPITAL_COMMUNITY): Payer: Self-pay | Admitting: Family Medicine

## 2014-09-24 ENCOUNTER — Emergency Department (HOSPITAL_COMMUNITY): Payer: Medicare Other

## 2014-09-24 ENCOUNTER — Emergency Department (HOSPITAL_COMMUNITY)
Admission: EM | Admit: 2014-09-24 | Discharge: 2014-09-25 | Disposition: A | Discharge status: Home / Self Care | Payer: Medicare Other | Attending: Emergency Medicine | Admitting: Emergency Medicine

## 2014-09-24 DIAGNOSIS — Z9861 Coronary angioplasty status: Secondary | ICD-10-CM | POA: Diagnosis not present

## 2014-09-24 DIAGNOSIS — Z9889 Other specified postprocedural states: Secondary | ICD-10-CM | POA: Insufficient documentation

## 2014-09-24 DIAGNOSIS — K9423 Gastrostomy malfunction: Secondary | ICD-10-CM

## 2014-09-24 DIAGNOSIS — Y833 Surgical operation with formation of external stoma as the cause of abnormal reaction of the patient, or of later complication, without mention of misadventure at the time of the procedure: Secondary | ICD-10-CM | POA: Insufficient documentation

## 2014-09-24 DIAGNOSIS — Z79899 Other long term (current) drug therapy: Secondary | ICD-10-CM | POA: Diagnosis not present

## 2014-09-24 DIAGNOSIS — Z87891 Personal history of nicotine dependence: Secondary | ICD-10-CM | POA: Insufficient documentation

## 2014-09-24 DIAGNOSIS — Z794 Long term (current) use of insulin: Secondary | ICD-10-CM | POA: Diagnosis not present

## 2014-09-24 DIAGNOSIS — Z8673 Personal history of transient ischemic attack (TIA), and cerebral infarction without residual deficits: Secondary | ICD-10-CM | POA: Diagnosis not present

## 2014-09-24 DIAGNOSIS — I10 Essential (primary) hypertension: Secondary | ICD-10-CM | POA: Diagnosis not present

## 2014-09-24 DIAGNOSIS — E119 Type 2 diabetes mellitus without complications: Secondary | ICD-10-CM | POA: Insufficient documentation

## 2014-09-24 DIAGNOSIS — Z880 Allergy status to penicillin: Secondary | ICD-10-CM | POA: Diagnosis not present

## 2014-09-24 DIAGNOSIS — Z431 Encounter for attention to gastrostomy: Secondary | ICD-10-CM | POA: Diagnosis present

## 2014-09-24 LAB — CBG MONITORING, ED: Glucose-Capillary: 136 mg/dL — ABNORMAL HIGH (ref 70–99)

## 2014-09-24 MED ORDER — IOHEXOL 300 MG/ML  SOLN
50.0000 mL | Freq: Once | INTRAMUSCULAR | Status: AC | PRN
  Administered 2014-09-24: 50 mL

## 2014-09-24 NOTE — Discharge Instructions (Signed)
Care of a Feeding Tube °People who have trouble swallowing or cannot take food or medicine by mouth are sometimes given feeding tubes. A feeding tube can go into the nose and down to the stomach or through the skin in the abdomen and into the stomach or small bowel. Some of the names of these feeding tubes are gastrostomy tubes, PEG lines, nasogastric tubes, and gastrojejunostomy tubes.  °SUPPLIES NEEDED TO CARE FOR THE TUBE SITE °· Clean gloves. °· Clean wash cloth, gauze pads, or soft paper towel. °· Cotton swabs. °· Skin barrier ointment or cream. °· Soap and water. °· Pre-cut foam pads or gauze (that go around the tube). °· Tube tape. °TUBE SITE CARE °1. Have all supplies ready and available. °2. Wash hands well. °3. Put on clean gloves. °4. Remove the soiled foam pad or gauze, if present, that is found under the tube stabilizer. Change the foam pad or gauze daily or when soiled or moist. °5. Check the skin around the tube site for redness, rash, swelling, drainage, or extra tissue growth. If you notice any of these, call your caregiver. °6. Moisten gauze and cotton swabs with water and soap. °7. Wipe the area closest to the tube (right near the stoma) with cotton swabs. Wipe the surrounding skin with moistened gauze. Rinse with water. °8. Dry the skin and stoma site with a dry gauze pad or soft paper towel. Do not use antibiotic ointments at the tube site. °9. If the skin is red, apply a skin barrier cream or ointment (such as petroleum jelly) in a circular motion, using a cotton swab. The cream or ointment will provide a moisture barrier for the skin and helps with wound healing. °10. Apply a new pre-cut foam pad or gauze around the tube. Secure it with tape around the edges. If no drainage is present, foam pads or gauze may be left off. °11. Use tape or an anchoring device to fasten the feeding tube to the skin for comfort or as directed. Rotate where you tape the tube to avoid skin damage from the  adhesive. °12. Position the person in a semi-upright position (30-45 degree angle). °13. Throw away used supplies. °14. Remove gloves. °15. Wash hands. °SUPPLIES NEEDED TO FLUSH A FEEDING TUBE °· Clean gloves. °· 60 mL syringe (that connects to the feeding tube). °· Towel. °· Water. °FLUSHING A FEEDING TUBE  °1. Have all supplies ready and available. °2. Wash hands well. °3. Put on clean gloves. °4. Draw up 30 mL of water in the syringe. °5. Kink the feeding tube while disconnecting it from the feeding-bag tubing or while removing the plug at the end of the tube. Kinking closes the tube and prevents secretions in the tube from spilling out. °6. Insert the tip of the syringe into the end of the feeding tube. Release the kink. Slowly inject the water. °7. If unable to inject the water, the person with the feeding tube should lay on his or her left side. The tip of the tube may be against the stomach wall, blocking fluid flow. Changing positions may move the tip away from the stomach wall. After repositioning, try injecting the water again. °8. After injecting the water, remove the syringe. °9. Always flush before giving the first medicine, between medicines, and after the final medicine before starting a feeding. This prevents medicines from clogging the tube. °10. Throw away used supplies. °11. Remove gloves. °12. Wash hands. °Document Released: 07/20/2005 Document Revised: 07/06/2012 Document Reviewed: 03/03/2012 °  ExitCare® Patient Information ©2015 [employer], LLC. This information is not intended to replace advice given to you by your health care provider. Make sure you discuss any questions you have with your health care provider. ° °

## 2014-09-24 NOTE — ED Notes (Signed)
Paged materials to find needed supplies. Awaiting return call with call back number : [phone].

## 2014-09-24 NOTE — ED Notes (Signed)
Full feeding given, with flush as well.

## 2014-09-24 NOTE — ED Notes (Signed)
Foley cath size 16Fr. Placed by Dr. Donnald GarrePfeiffer. Dressed site, awaiting new x-ray for placement confirmation.

## 2014-09-24 NOTE — ED Notes (Signed)
Pt here for peg tube not working. sts the feedings have been taking over an hour. sts suppose to last 10 min.  sts he thinks the tube is too small.

## 2014-09-24 NOTE — ED Notes (Signed)
Checked patient blood sugar it was 136 notified RN Jessica of blood sugar

## 2014-09-24 NOTE — ED Notes (Signed)
Spoke with materials, still not received peg tube replacement. Spoke to Dr. Donnald GarrePfeiffer in regards to delay.

## 2014-09-24 NOTE — ED Notes (Signed)
Spoke to charge, patient has concerns about transportation home. Address[place] [address] [place], [place] [id number]. Arranging for cab voucher. Patient is alert and oriented, lives home alone.

## 2014-09-24 NOTE — ED Notes (Addendum)
Requested replacment PEG tube with form, size 16Fr. And 18Fr. Peg tube.

## 2014-09-24 NOTE — ED Notes (Signed)
Discussed delay with Dr. Donnald GarrePfeiffer, awaiting replacement peg tube.

## 2014-09-24 NOTE — ED Notes (Signed)
Pt PEG flushed with 100cc, followed by 60cc draining to gravity.  No issues noted.  MD aware.

## 2014-09-24 NOTE — ED Notes (Signed)
Reviewed plan of care and available supplies with Dr. Donnald GarrePfeiffer, will prepare for temporary foley placement .

## 2014-09-24 NOTE — ED Notes (Signed)
Attempted to give pt his home feed. Food would not flow with gravity. MD notified.

## 2014-09-24 NOTE — ED Notes (Signed)
Full feeding administered, patient verbalizes steps to administer, but complains of tremor in right hand. Concern to administer by self at home. Discussed with Dr. Donnald GarrePfeiffer.

## 2014-09-24 NOTE — ED Notes (Signed)
Feeding pushed through tube, no resistance noted, will not drain to gravity.

## 2014-09-24 NOTE — ED Provider Notes (Signed)
CSN: [id number]     Arrival date & time 09/24/14  1237 History   First MD Initiated Contact with Patient 09/24/14 1721     Chief Complaint  Patient presents with  . peg tube not working      (Consider location/radiation/quality/duration/timing/severity/associated sxs/prior Treatment) HPI The patient ports he is unable to put his feet in through a 14 French Foley placed 2 days ago in the emergency department after his tube had become dislodged. He reports that the liquid feed will not flow through the tube. He does not have other complaints. Past Medical History  Diagnosis Date  . Hypertension   . Diabetes mellitus   . Stroke march 2014    R side weakness;hemorrhagic cerebellar CVA, evacuated   Past Surgical History  Procedure Laterality Date  . Heart stents  x3  . Cardiac surgery    . Hernia repair    . Rotator cuff repair    . Craniectomy N/A 10/25/2012    Procedure: Suboccipital Craniectomy for Evacuation of Cerebellar Hematoma;  Surgeon: Maeola HarmanJoseph Stern, MD;  Location: [facility];  Service: Neurosurgery;  Laterality: N/A;  Suboccipital Craniectomy for Evacuation of Cerebellar Hematoma  . Peg tube placement      [facility] ?2014, [facility]   History reviewed. No pertinent family history. History  Substance Use Topics  . Smoking status: Former Smoker -- 1.00 packs/day for 25 years    Quit date: 04/03/2013  . Smokeless tobacco: Not on file  . Alcohol Use: No    Review of Systems  Constitutional: No fevers no chills GI: No vomiting or abdominal pain  Allergies  Lotensin and Penicillins  Home Medications   Prior to Admission medications   Medication Sig Start Date End Date Taking? Authorizing Provider  acetaminophen (TYLENOL) 325 MG tablet Take 650 mg by mouth every 6 (six) hours as needed for mild pain.   Yes Historical Provider, MD  brimonidine (ALPHAGAN) 0.2 % ophthalmic solution Place 1 drop into both eyes every 8 (eight) hours. 01/19/13  Yes Lucrezia StarchEdwina Berry,  NP  clindamycin (CLEOCIN) 150 MG capsule Take 1 capsule (150 mg total) by mouth every 6 (six) hours. 06/01/14  Yes Fayrene HelperBowie Tran, PA-C  dorzolamide-timolol (COSOPT) 22.3-6.8 MG/ML ophthalmic solution Place 1 drop into both eyes 2 (two) times daily.   Yes Historical Provider, MD  folic acid (FOLVITE) 1 MG tablet Take 1 tablet (1 mg total) by mouth daily. 01/19/13  Yes Lucrezia StarchEdwina Berry, NP  glipiZIDE (GLUCOTROL) 10 MG tablet Take 10 mg by mouth 2 (two) times daily before a meal.   Yes Historical Provider, MD  hydrochlorothiazide (HYDRODIURIL) 25 MG tablet Take 1 tablet (25 mg total) by mouth daily. 01/19/13  Yes Lucrezia StarchEdwina Berry, NP  insulin glargine (LANTUS) 100 UNIT/ML injection Inject 16-22 Units into the skin at bedtime.    Yes Historical Provider, MD  latanoprost (XALATAN) 0.005 % ophthalmic solution Place 1 drop into both eyes at bedtime. 01/19/13  Yes Lucrezia StarchEdwina Berry, NP  losartan (COZAAR) 100 MG tablet Take 50 mg by mouth daily.   Yes Historical Provider, MD  meclizine (ANTIVERT) 25 MG tablet Take 1 tablet (25 mg total) by mouth 2 (two) times daily. 01/19/13  Yes Lucrezia StarchEdwina Berry, NP  metoCLOPramide (REGLAN) 10 MG tablet Take 1 tablet (10 mg total) by mouth every 6 (six) hours as needed (for residuals in feeding tube). 02/02/14  Yes Courtney A Forcucci, PA-C  Multiple Vitamin (MULTIVITAMIN WITH MINERALS) TABS Take 1 tablet by mouth daily. 01/19/13  Yes Lucrezia Starch, NP  naproxen sodium (ANAPROX) 220 MG tablet Take 220 mg by mouth 2 (two) times daily as needed (pain).   Yes Historical Provider, MD  OVER THE COUNTER MEDICATION Apply 1 application topically daily as needed (foot cream).   Yes Historical Provider, MD  pantoprazole (PROTONIX) 40 MG tablet Take 1 tablet (40 mg total) by mouth daily. 01/19/13  Yes Lucrezia Starch, NP  HYDROcodone-acetaminophen (NORCO/VICODIN) 5-325 MG per tablet Take 1 tablet by mouth every 6 (six) hours as needed. Patient not taking: Reported on 09/24/2014 06/01/14   Fayrene Helper, PA-C   BP  101/63 mmHg  Pulse 75  Temp(Src) 97.8 F (36.6 C) (Oral)  Resp 13  SpO2 100% Physical Exam  Constitutional:  The patient is well-appearing and alert. He has baseline stroke neurologic dysfunction but is interactive and pleasant. No respiratory distress.  HENT:  Head: Normocephalic and atraumatic.  Cardiovascular: Normal rate and intact distal pulses.   Pulmonary/Chest: Effort normal and breath sounds normal. No respiratory distress.  Abdominal: Soft. Bowel sounds are normal. He exhibits no distension. There is no tenderness.  Neurological: He is alert.  Skin: Skin is warm and dry.  Psychiatric: He has a normal mood and affect.    ED Course  FEEDING TUBE REPLACEMENT Date/Time: 09/24/2014 11:45 PM Performed by: Arby Barrette Authorized by: Arby Barrette Indications: tube malfunction Local anesthesia used: no Patient sedated: no Tube type: gastrostomy Patient position: supine Procedure type: replacement Tube size: 16 Fr Endoscope used: no Bulb inflation volume: 10 (ml) Bulb inflation fluid: sterile water Placement/position confirmation: contrast Tube placement difficulty: none Patient tolerance: Patient tolerated the procedure well with no immediate complications   (including critical care time) Labs Review Labs Reviewed  CBG MONITORING, ED - Abnormal; Notable for the following:    Glucose-Capillary 136 (*)    All other components within normal limits    Imaging Review Dg Abd 1 View  09/24/2014   CLINICAL DATA:  Peg placement, post feeding tube placement.  EXAM: ABDOMEN - 1 VIEW  COMPARISON:  Abdominal radiograph September 22, 2014  FINDINGS: Gastrostomy tube in place, retaining bubble obscured likely by contrast. Contrast outlining the gastric rugae and proximal duodenum, similar to prior examination. Included bowel gas pattern is nondilated and nonobstructive. Limited view of the lung bases are clear. Osseous structures are nonsuspicious.  IMPRESSION: Intraluminal  gastrostomy tube without evidence of contrast extravasation.   Electronically Signed   By: Awilda Metro   On: 09/24/2014 22:37     EKG Interpretation None      MDM   Final diagnoses:  PEG tube malfunction   I was able to go up one size on the foley catheter used for feeding tube replacement from a 14 [place] to a 69 [place]. The patient still however is unable to physically perform the feet as it needs to be done with a [place] syringe manually and will not flow to gravity alone. His full evening feed was administered. A social services consult will be placed so the patient can get in-home assistance in administering his feed through the tube until he can get a true PEG tube placed by surgery. The patient otherwise is well in appearance and stable for discharge.    Arby Barrette, MD 09/24/14 [phone]

## 2014-10-21 ENCOUNTER — Encounter (HOSPITAL_COMMUNITY): Payer: Self-pay | Admitting: Emergency Medicine

## 2014-10-21 ENCOUNTER — Emergency Department (HOSPITAL_COMMUNITY)
Admission: EM | Admit: 2014-10-21 | Discharge: 2014-10-21 | Disposition: A | Discharge status: Home / Self Care | Payer: Medicare Other | Attending: Emergency Medicine | Admitting: Emergency Medicine

## 2014-10-21 ENCOUNTER — Emergency Department (HOSPITAL_COMMUNITY): Payer: Medicare Other

## 2014-10-21 DIAGNOSIS — Z880 Allergy status to penicillin: Secondary | ICD-10-CM | POA: Insufficient documentation

## 2014-10-21 DIAGNOSIS — Z79899 Other long term (current) drug therapy: Secondary | ICD-10-CM | POA: Diagnosis not present

## 2014-10-21 DIAGNOSIS — Z794 Long term (current) use of insulin: Secondary | ICD-10-CM | POA: Diagnosis not present

## 2014-10-21 DIAGNOSIS — I10 Essential (primary) hypertension: Secondary | ICD-10-CM | POA: Diagnosis not present

## 2014-10-21 DIAGNOSIS — Z87891 Personal history of nicotine dependence: Secondary | ICD-10-CM | POA: Diagnosis not present

## 2014-10-21 DIAGNOSIS — Z8673 Personal history of transient ischemic attack (TIA), and cerebral infarction without residual deficits: Secondary | ICD-10-CM | POA: Insufficient documentation

## 2014-10-21 DIAGNOSIS — R42 Dizziness and giddiness: Secondary | ICD-10-CM

## 2014-10-21 DIAGNOSIS — E785 Hyperlipidemia, unspecified: Secondary | ICD-10-CM | POA: Diagnosis not present

## 2014-10-21 DIAGNOSIS — E119 Type 2 diabetes mellitus without complications: Secondary | ICD-10-CM | POA: Insufficient documentation

## 2014-10-21 HISTORY — DX: Pain in left shoulder: M25.512

## 2014-10-21 HISTORY — DX: Hyperlipidemia, unspecified: E78.5

## 2014-10-21 HISTORY — DX: Dysphagia following cerebral infarction: I69.391

## 2014-10-21 HISTORY — DX: Pain in right knee: M25.561

## 2014-10-21 LAB — COMPREHENSIVE METABOLIC PANEL
ALBUMIN: 3.4 g/dL — AB (ref 3.5–5.2)
ALK PHOS: 58 U/L (ref 39–117)
ALT: 11 U/L (ref 0–53)
ANION GAP: 10 (ref 5–15)
AST: 16 U/L (ref 0–37)
BUN: 18 mg/dL (ref 6–23)
CO2: 26 mmol/L (ref 19–32)
Calcium: 9.1 mg/dL (ref 8.4–10.5)
Chloride: 99 mmol/L (ref 96–112)
Creatinine, Ser: 1.1 mg/dL (ref 0.50–1.35)
GFR, EST AFRICAN AMERICAN: 72 mL/min — AB (ref >90)
GFR, EST NON AFRICAN AMERICAN: 62 mL/min — AB (ref >90)
Glucose, Bld: 264 mg/dL — ABNORMAL HIGH (ref 70–99)
POTASSIUM: 4.2 mmol/L (ref 3.5–5.1)
Sodium: 135 mmol/L (ref 135–145)
Total Bilirubin: 0.8 mg/dL (ref 0.3–1.2)
Total Protein: 6.3 g/dL (ref 6.0–8.3)

## 2014-10-21 LAB — I-STAT CHEM 8, ED
BUN: 20 mg/dL (ref 6–23)
CHLORIDE: 100 mmol/L (ref 96–112)
CREATININE: 1 mg/dL (ref 0.50–1.35)
Calcium, Ion: 1.12 mmol/L — ABNORMAL LOW (ref 1.13–1.30)
GLUCOSE: 271 mg/dL — AB (ref 70–99)
HCT: 40 % (ref 39.0–52.0)
HEMOGLOBIN: 13.6 g/dL (ref 13.0–17.0)
Potassium: 4.3 mmol/L (ref 3.5–5.1)
SODIUM: 137 mmol/L (ref 135–145)
TCO2: 23 mmol/L (ref 0–100)

## 2014-10-21 LAB — URINALYSIS, ROUTINE W REFLEX MICROSCOPIC
BILIRUBIN URINE: NEGATIVE
Glucose, UA: NEGATIVE mg/dL
Hgb urine dipstick: NEGATIVE
Ketones, ur: NEGATIVE mg/dL
Leukocytes, UA: NEGATIVE
NITRITE: NEGATIVE
Protein, ur: NEGATIVE mg/dL
SPECIFIC GRAVITY, URINE: 1.012 (ref 1.005–1.030)
Urobilinogen, UA: 1 mg/dL (ref 0.0–1.0)
pH: 7.5 (ref 5.0–8.0)

## 2014-10-21 LAB — PROTIME-INR
INR: 1 (ref 0.00–1.49)
Prothrombin Time: 13.3 seconds (ref 11.6–15.2)

## 2014-10-21 LAB — CBC
HEMATOCRIT: 39.2 % (ref 39.0–52.0)
Hemoglobin: 13.3 g/dL (ref 13.0–17.0)
MCH: 30.4 pg (ref 26.0–34.0)
MCHC: 33.9 g/dL (ref 30.0–36.0)
MCV: 89.7 fL (ref 78.0–100.0)
Platelets: 230 10*3/uL (ref 150–400)
RBC: 4.37 MIL/uL (ref 4.22–5.81)
RDW: 12.7 % (ref 11.5–15.5)
WBC: 4.2 10*3/uL (ref 4.0–10.5)

## 2014-10-21 LAB — RAPID URINE DRUG SCREEN, HOSP PERFORMED
AMPHETAMINES: NOT DETECTED
Barbiturates: NOT DETECTED
Benzodiazepines: NOT DETECTED
Cocaine: NOT DETECTED
OPIATES: NOT DETECTED
TETRAHYDROCANNABINOL: NOT DETECTED

## 2014-10-21 LAB — DIFFERENTIAL
Basophils Absolute: 0 10*3/uL (ref 0.0–0.1)
Basophils Relative: 0 % (ref 0–1)
Eosinophils Absolute: 0 10*3/uL (ref 0.0–0.7)
Eosinophils Relative: 1 % (ref 0–5)
Lymphocytes Relative: 39 % (ref 12–46)
Lymphs Abs: 1.6 10*3/uL (ref 0.7–4.0)
MONOS PCT: 11 % (ref 3–12)
Monocytes Absolute: 0.4 10*3/uL (ref 0.1–1.0)
Neutro Abs: 2.1 10*3/uL (ref 1.7–7.7)
Neutrophils Relative %: 49 % (ref 43–77)

## 2014-10-21 LAB — APTT: aPTT: 26 seconds (ref 24–37)

## 2014-10-21 LAB — I-STAT TROPONIN, ED: Troponin i, poc: 0 ng/mL (ref 0.00–0.08)

## 2014-10-21 LAB — ETHANOL

## 2014-10-21 MED ORDER — LORAZEPAM 2 MG/ML IJ SOLN
0.5000 mg | Freq: Once | INTRAMUSCULAR | Status: AC
  Administered 2014-10-21: 0.5 mg via INTRAVENOUS
  Filled 2014-10-21: qty 1

## 2014-10-21 MED ORDER — LORAZEPAM 1 MG PO TABS
0.5000 mg | ORAL_TABLET | Freq: Once | ORAL | Status: DC
Start: 2014-10-21 — End: 2014-10-21
  Filled 2014-10-21: qty 1

## 2014-10-21 MED ORDER — MECLIZINE HCL 25 MG PO TABS
25.0000 mg | ORAL_TABLET | Freq: Once | ORAL | Status: AC
  Administered 2014-10-21: 25 mg via ORAL
  Filled 2014-10-21: qty 1

## 2014-10-21 NOTE — Discharge Instructions (Signed)
°Emergency Department Resource Guide °1) Find a Doctor and Pay Out of Pocket °Although you won't have to find out who is covered by your insurance plan, it is a good idea to ask around and get recommendations. You will then need to call the office and see if the doctor you have chosen will accept you as a new patient and what types of options they offer for patients who are self-pay. Some doctors offer discounts or will set up payment plans for their patients who do not have insurance, but you will need to ask so you aren't surprised when you get to your appointment. ° °2) Contact Your Local Health Department °Not all health departments have doctors that can see patients for sick visits, but many do, so it is worth a call to see if yours does. If you don't know where your local health department is, you can check in your phone book. The CDC also has a tool to help you locate your state's health department, and many state websites also have listings of all of their local health departments. ° °3) Find a Walk-in Clinic °If your illness is not likely to be very severe or complicated, you may want to try a walk in clinic. These are popping up all over the country in pharmacies, drugstores, and shopping centers. They're usually staffed by nurse practitioners or physician assistants that have been trained to treat common illnesses and complaints. They're usually fairly quick and inexpensive. However, if you have serious medical issues or chronic medical problems, these are probably not your best option. ° °No Primary Care Doctor: °- Call [facility] at  832-8000 - they can help you locate a primary care doctor that  accepts your insurance, provides certain services, etc. °- Physician Referral Service- 1-800-533-3463 ° °Chronic Pain Problems: °Organization         Address  Phone   Notes  °[facility]  (336) 297-2271 Patients need to be referred by their primary care doctor.  ° °Medication  Assistance: °Organization         Address  Phone   Notes  °[place] [facility] [address] °[place], [place] [address] (336) 641-8030 --Must be a resident of [place] °-- Must have NO insurance coverage whatsoever (no Medicaid/ Medicare, etc.) °-- The pt. MUST have a primary care doctor that directs their care regularly and follows them in the community °  °[facility]  (866) 331-1348   °[employer]  (888) 892-1162   ° °Agencies that provide inexpensive medical care: °Organization         Address  Phone   Notes  °[facility]  (336) 832-8035   °[facility]    (336) 832-7272   °[facility] [address] °[place], [place] [address] (336) 832-4777   °[facility] [address], °[place] (336) 271-4999   °[facility]    (336) 373-0678   °[facility]    (336) 272-1050   °[facility] ° [address], [place] Phone:  (336) 832-4444, Fax:  (336) 832-4440 Hours of Operation:  9 am - 6 pm, M-F.  Also accepts Medicaid/Medicare and self-pay.  °[facility] ° [address], [place] Phone: (336) 832-3150, Fax: (336) 832-3151. Hours of Operation:  8:30 am - 5:30 pm, M-F.  Also accepts Medicaid and self-pay.  °[facility] [place] [address], [place] Phone: (336) 878-6027   °[facility] [address], [place], [place] (336)723-1848, Ext. 123 Mondays & Thursdays: 7-9 AM.  First 15 patients are seen on a first come, first serve basis. °  ° °Medicaid-accepting [place] Providers: ° °Organization         Address  Phone   Notes  °[facility] [address], [place] (336) 641-2100 Also accepts self-pay patients.  °[facility] [address], [place] ° (336) 856-9996   °[facility] [address], [place]  (336) 288-8857   °[facility] [address] [place] [address], [place] (336) 299-7000   °Veita Bland [address], [place]  ° (336) 373-1557 Only accepts [place] Access Medicaid patients after they have their name applied to their card.  ° °Self-Pay (no insurance) in [place]: ° °Organization         Address  Phone   Notes  °Sickle Cell Patients, [facility] [address], [place] (336) 832-1970   °[facility] [address], [place] (336) 832-4400   °[facility] ° [address], [place] (336) 992-4800   °[facility]/Dr. Osei-Bonsu ° [address] [place] [address], [place] or [address], [place] (336) 841-8500 Phone number for both [place] and [place] locations is the same.  °[facility] [address], [place] (336) 299-0000   °[facility] [address] [place] [address], [place] or [address] (336) 852-7530 °(336) 878-2260   °[facility] [address], [place] (336) 350-1642, phone; (336) 294-5005, fax Sees patients 1st and 3rd Saturday of every month.  Must not qualify for public or private insurance (i.e. Medicaid, Medicare, [place] Health Choice, Veterans' Benefits) • Household income should be no more than 200% of the poverty level •The clinic cannot treat you if you are pregnant or think you are pregnant • Sexually transmitted diseases are not treated at the clinic.  ° ° °Dental Care: °Organization         Address  Phone  Notes  °[place] [facility] [address], [place] (336) 641-6152 Accepts children up to age 21 who are enrolled in Medicaid or [place] Health Choice; pregnant women with a Medicaid card; and children who have applied for Medicaid or [place] Health Choice, but were declined, whose parents can pay a reduced fee at time of service.  °[place]  [facility] [place]  [address], [place] (336) 641-7733 Accepts children up to age 21 who are enrolled in Medicaid or [place] Health Choice; pregnant women with a Medicaid card; and children who have applied for Medicaid or [place] Health Choice, but were declined, whose parents can pay a reduced fee at time of service.  °[facility] ° [address], [place] (336) 641-4533 Patients are seen by appointment only. Walk-ins are not accepted. [facility] will see patients 18 years of age and older. °Monday - Tuesday (8am-5pm) °Most Wednesdays (8:30-5pm) °$30 per visit, cash only  °[facility] ° [address], [place] (336) 641-4533 Patients are seen by appointment only. Walk-ins are not accepted. [facility] will see patients 18 years of age and older. °One   Wednesday Evening (Monthly: Volunteer Based).  $30 per visit, cash only  °[facility]  (919) 537-3737 for adults; Children under age 4, call Graduate Pediatric Dentistry at ([phone]) [phone]. Children aged 4-14, please call ([phone]) [phone] to request a pediatric application. ° Dental services are provided in all areas of dental care including fillings, crowns and bridges, complete and partial dentures, implants, gum treatment, root canals, and extractions. Preventive care is also provided. Treatment is provided to both adults and children. °Patients are selected via a lottery and there is often a waiting list. °  °[facility] [address], °[place] ° (336) 763-8833 [email or web address] °  °[facility] [address], [place], [place] (336)723-1848, Ext. 123 Second and Fourth Thursday of each month, opens at 6:30 AM; Clinic ends at 9 AM.  Patients are seen on a first-come first-served basis, and a limited number are seen during each clinic.  ° °[facility] ° [address], [place], [place] (336) 723-7904    Eligibility Requirements °You must have lived in [place], [place], or [place] counties for at least the last three months. °  You cannot be eligible for state or federal sponsored healthcare insurance, including Veterans Administration, Medicaid, or Medicare. °  You generally cannot be eligible for healthcare insurance through your employer.  °  How to apply: °Eligibility screenings are held every Tuesday and Wednesday afternoon from 1:00 pm until 4:00 pm. You do not need an appointment for the interview!  °[facility] [address], [place], [place] 336-631-2330   °[facility]  336-342-8273   °[facility]  336-703-3100   °[facility]  336-570-6415   ° °Behavioral Health Resources in the Community: °Intensive Outpatient Programs °Organization         Address  Phone  Notes  °[facility] [address], [place], [place] 336-878-6098   °[facility] [address], [place], [place] 336-832-9800   °[facility] [address], [place], [place] ° 336-882-2125   °[facility] [address],  °[place], [place] 1-800-853-5163 or 336-641-4981   °Substance Abuse Resources °Organization         Address  Phone  Notes  °[facility]  336-882-2125   °[facility]  336-784-9470   °[facility]  336-285-9073   °[facility]  336-845-3988   °Residential & Outpatient Substance Abuse Program  1-800-659-3381   °Psychological Services °Organization         Address  Phone  Notes  °[facility]  336- 832-9600   °[facility]  336- 378-7881   °[facility] [address], [place] 1-800-853-5163 or 336-641-4981   ° °Mobile Crisis Teams °Organization         Address  Phone  Notes  °[facility], Mobile Crisis Care Unit  1-877-626-1772   °Assertive °[facility] ° [address]  [place], [place] 336-834-9664   °Sharon DeEsch [address] °[place] 336-554-5454   ° °Self-Help/Support Groups °Organization         Address  Phone             Notes  °[facility]  - variety of support groups  336- 373-1402 Call for more information  °Narcotics Anonymous (NA), [facility] [address], °[place]  2 meetings at this location  ° °  Residential Treatment Programs Organization         Address  Phone  Notes  [facility] [address],    [place]  [phone]   [facility]  [address], [place] [phone], [place], [place] [phone]   [facility] [address], [place] [phone] Admissions: 8am-3pm M-F  [facility] [address]    [place], [place] [phone]   [facility] [address], [place], [place] [phone]   [facility] [address]  [place], [place] [phone])[phone]   [facility] Intensive Outpatient [address], [place], [place] [phone]   [facility] [address]  [place], [place] [phone] or [phone]   [facility] [address] [place], [place] [phone] Accepts Medicaid  [facility] [address]  [place] [phone]-([phone]) [phone] Substance Abuse/Addiction Treatment   [facility]         Address  Phone  Notes  [facility]  (309)389-2915(888) [phone]   Angie FavaJulie Brannon, PhD [address], [address] [place], [place]   (304) [phone](336) [phone] or [phone](336) ([phone]   [facility]   [address] [place], [place] [phone](336) ([phone]   [facility] [address], [place], [place] [phone](336) [phone] Insurance/Medicaid/sponsorship through [facility] [address]                                    [place], [place] [phone](336) [phone] Therapy/tele-psych/case    [facility] [address]   [place], [place] ([phone](336) [phone]    Dr. Lolly MustacheArfeen  [phone](336) [phone]   [facility]  [employer] [facility] 1) [address], [place] 2) [address], [place] 3)  [address], [place] [phone](336) [phone](336) [phone]  [phone](336) [phone]   [facility] Child Abuse Hot[facility] 334-679-9269(336) (256) 577-2301 or [phone](336) [phone] (After Hours)      Take your usual prescriptions as previously directed. Walk with your walker.  Call your regular medical doctor tomorrow to schedule a follow up appointment within the next 2 days.  Return to the Emergency Department immediately sooner if worsening.

## 2014-10-21 NOTE — ED Provider Notes (Signed)
CSN: [id number]     Arrival date & time 10/21/14  1005 History   First MD Initiated Contact with Patient 10/21/14 1011     Chief Complaint  Patient presents with  . Dizziness      HPI Pt was seen at 1020. Per EMS and pt, c/o gradual onset and worsening of persistent "dizziness" since yesterday. Pt states he has hx of dizziness after his previous CVA, rx antivert. Pt states he took it yesterday without improvement. Pt states he "is scared I had another stroke." Pt states he experienced this same symptom "the last time I had a stroke." Pt describes the dizziness as "everything is moving around." Denies near syncope/syncope, no CP/SOB, no abd pain, no N/V/D, no falls. Denies change in his baseline neuro deficits from his previous CVA.    Past Medical History  Diagnosis Date  . Hypertension   . Diabetes mellitus   . Left shoulder pain   . Hyperlipidemia   . Right knee pain   . Dysphagia following cerebrovascular accident     PEG tube  . Stroke march 2014    R side weakness, slurred speech, dizziness, visual changes, ataxia; hemorrhagic cerebellar CVA, evacuated   Past Surgical History  Procedure Laterality Date  . Heart stents  x3  . Cardiac surgery    . Hernia repair    . Rotator cuff repair    . Craniectomy N/A 10/25/2012    Procedure: Suboccipital Craniectomy for Evacuation of Cerebellar Hematoma;  Surgeon: Maeola Harman, MD;  Location: [facility];  Service: Neurosurgery;  Laterality: N/A;  Suboccipital Craniectomy for Evacuation of Cerebellar Hematoma  . Peg tube placement      placed ?2014, changed 01/2014 (at [facility])    History  Substance Use Topics  . Smoking status: Former Smoker -- 1.00 packs/day for 25 years    Quit date: 04/03/2013  . Smokeless tobacco: Not on file  . Alcohol Use: No    Review of Systems ROS: Statement: All systems negative except as marked or noted in the HPI; Constitutional: Negative for fever and chills. ; ; Eyes: Negative for eye pain, redness and  discharge. ; ; ENMT: Negative for ear pain, hoarseness, nasal congestion, sinus pressure and sore throat. ; ; Cardiovascular: Negative for chest pain, palpitations, diaphoresis, dyspnea and peripheral edema. ; ; Respiratory: Negative for cough, wheezing and stridor. ; ; Gastrointestinal: Negative for nausea, vomiting, diarrhea, abdominal pain, blood in stool, hematemesis, jaundice and rectal bleeding. . ; ; Genitourinary: Negative for dysuria, flank pain and hematuria. ; ; Musculoskeletal: Negative for back pain and neck pain. Negative for swelling and trauma.; ; Skin: Negative for pruritus, rash, abrasions, blisters, bruising and skin lesion.; ; Neuro: +"dizziness." Negative for headache, lightheadedness and neck stiffness. Negative for weakness, altered level of consciousness , altered mental status, extremity weakness, paresthesias, involuntary movement, seizure and syncope.     Allergies  Lotensin and Penicillins  Home Medications   Prior to Admission medications   Medication Sig Start Date End Date Taking? Authorizing Provider  acetaminophen (TYLENOL) 325 MG tablet Take 650 mg by mouth every 6 (six) hours as needed for mild pain.   Yes Historical Provider, MD  brimonidine (ALPHAGAN) 0.2 % ophthalmic solution Place 1 drop into both eyes every 8 (eight) hours. 01/19/13  Yes Lucrezia Starch, NP  dorzolamide-timolol (COSOPT) 22.3-6.8 MG/ML ophthalmic solution Place 1 drop into both eyes 2 (two) times daily.   Yes Historical Provider, MD  folic acid (FOLVITE) 1 MG tablet Take  1 tablet (1 mg total) by mouth daily. 01/19/13  Yes Lucrezia Starch, NP  hydrochlorothiazide (HYDRODIURIL) 25 MG tablet Take 1 tablet (25 mg total) by mouth daily. 01/19/13  Yes Lucrezia Starch, NP  insulin glargine (LANTUS) 100 UNIT/ML injection Inject 50 Units into the skin at bedtime.    Yes Historical Provider, MD  latanoprost (XALATAN) 0.005 % ophthalmic solution Place 1 drop into both eyes at bedtime. 01/19/13  Yes Lucrezia Starch, NP   pantoprazole (PROTONIX) 40 MG tablet Take 1 tablet (40 mg total) by mouth daily. 01/19/13  Yes Lucrezia Starch, NP  clindamycin (CLEOCIN) 150 MG capsule Take 1 capsule (150 mg total) by mouth every 6 (six) hours. Patient not taking: Reported on 10/21/2014 06/01/14   Fayrene Helper, PA-C  HYDROcodone-acetaminophen (NORCO/VICODIN) 5-325 MG per tablet Take 1 tablet by mouth every 6 (six) hours as needed. Patient not taking: Reported on 09/24/2014 06/01/14   Fayrene Helper, PA-C  losartan (COZAAR) 100 MG tablet Take 50 mg by mouth daily.    Historical Provider, MD  meclizine (ANTIVERT) 25 MG tablet Take 1 tablet (25 mg total) by mouth 2 (two) times daily. 01/19/13   Lucrezia Starch, NP  metoCLOPramide (REGLAN) 10 MG tablet Take 1 tablet (10 mg total) by mouth every 6 (six) hours as needed (for residuals in feeding tube). 02/02/14   Courtney Forcucci, PA-C  Multiple Vitamin (MULTIVITAMIN WITH MINERALS) TABS Take 1 tablet by mouth daily. 01/19/13   Lucrezia Starch, NP  naproxen sodium (ANAPROX) 220 MG tablet Take 220 mg by mouth 2 (two) times daily as needed (pain).    Historical Provider, MD  OVER THE COUNTER MEDICATION Apply 1 application topically daily as needed (foot cream).    Historical Provider, MD   BP 119/48 mmHg  Pulse 49  Temp(Src) 98.2 F (36.8 C) (Oral)  Resp 11  SpO2 100%   11:24:22 Orthostatic Vital Signs HR  Orthostatic Lying  - BP- Lying: 111/60 mmHg ; Pulse- Lying: 48  Orthostatic Sitting - BP- Sitting: 105/56 mmHg ; Pulse- Sitting: 52  Orthostatic Standing at 0 minutes - BP- Standing at 0 minutes: 120/41 mmHg ; Pulse- Standing at 0 minutes: 60       Physical Exam  1025: Physical examination:  Nursing notes reviewed; Vital signs and O2 SAT reviewed;  Constitutional: Well developed, Well nourished, Well hydrated, In no acute distress; Head:  Normocephalic, atraumatic; Eyes: EOMI, Pupils irregularly shaped per hx. No scleral icterus; ENMT: Mouth and pharynx normal, Mucous membranes moist; Neck:  Supple, Full range of motion, No lymphadenopathy; Cardiovascular: Regular rate and rhythm, No gallop; Respiratory: Breath sounds clear & equal bilaterally, No wheezes.  Speaking full sentences with ease, Normal respiratory effort/excursion; Chest: Nontender, Movement normal; Abdomen: Soft, Nontender, Nondistended, Normal bowel sounds; Genitourinary: No CVA tenderness; Extremities: Pulses normal, No tenderness, No edema, No calf edema or asymmetry.; Neuro: AA&Ox3, Major CN grossly intact.  Speech slurred per hx. +right horizontal end gaze nystagmus. No facial droop. +right sided weakness per hx of previous CVA, otherwise no new gross focal motor deficits in extremities.; Skin: Color normal, Warm, Dry.   ED Course  Procedures     EKG Interpretation None      MDM  MDM Reviewed: previous chart, nursing note and vitals Reviewed previous: ECG and labs Interpretation: labs, ECG and MRI     Results for orders placed or performed during the hospital encounter of 10/21/14  Ethanol  Result Value Ref Range   Alcohol, Ethyl (B) <5 0 - 9 mg/dL  Protime-INR  Result Value Ref Range   Prothrombin Time 13.3 11.6 - 15.2 seconds   INR 1.00 0.00 - 1.49  APTT  Result Value Ref Range   aPTT 26 24 - 37 seconds  CBC  Result Value Ref Range   WBC 4.2 4.0 - 10.5 K/uL   RBC 4.37 4.22 - 5.81 MIL/uL   Hemoglobin 13.3 13.0 - 17.0 g/dL   HCT 16.139.2 09.639.0 - 04.552.0 %   MCV 89.7 78.0 - 100.0 fL   MCH 30.4 26.0 - 34.0 pg   MCHC 33.9 30.0 - 36.0 g/dL   RDW 40.912.7 81.111.5 - 91.415.5 %   Platelets 230 150 - 400 K/uL  Differential  Result Value Ref Range   Neutrophils Relative % 49 43 - 77 %   Neutro Abs 2.1 1.7 - 7.7 K/uL   Lymphocytes Relative 39 12 - 46 %   Lymphs Abs 1.6 0.7 - 4.0 K/uL   Monocytes Relative 11 3 - 12 %   Monocytes Absolute 0.4 0.1 - 1.0 K/uL   Eosinophils Relative 1 0 - 5 %   Eosinophils Absolute 0.0 0.0 - 0.7 K/uL   Basophils Relative 0 0 - 1 %   Basophils Absolute 0.0 0.0 - 0.1 K/uL   Comprehensive metabolic panel  Result Value Ref Range   Sodium 135 135 - 145 mmol/L   Potassium 4.2 3.5 - 5.1 mmol/L   Chloride 99 96 - 112 mmol/L   CO2 26 19 - 32 mmol/L   Glucose, Bld 264 (H) 70 - 99 mg/dL   BUN 18 6 - 23 mg/dL   Creatinine, Ser 7.821.10 0.50 - 1.35 mg/dL   Calcium 9.1 8.4 - 95.610.5 mg/dL   Total Protein 6.3 6.0 - 8.3 g/dL   Albumin 3.4 (L) 3.5 - 5.2 g/dL   AST 16 0 - 37 U/L   ALT 11 0 - 53 U/L   Alkaline Phosphatase 58 39 - 117 U/L   Total Bilirubin 0.8 0.3 - 1.2 mg/dL   GFR calc non Af Amer 62 (L) >90 mL/min   GFR calc Af Amer 72 (L) >90 mL/min   Anion gap 10 5 - 15  Urine Drug Screen  Result Value Ref Range   Opiates NONE DETECTED NONE DETECTED   Cocaine NONE DETECTED NONE DETECTED   Benzodiazepines NONE DETECTED NONE DETECTED   Amphetamines NONE DETECTED NONE DETECTED   Tetrahydrocannabinol NONE DETECTED NONE DETECTED   Barbiturates NONE DETECTED NONE DETECTED  Urinalysis, Routine w reflex microscopic  Result Value Ref Range   Color, Urine YELLOW YELLOW   APPearance CLEAR CLEAR   Specific Gravity, Urine 1.012 1.005 - 1.030   pH 7.5 5.0 - 8.0   Glucose, UA NEGATIVE NEGATIVE mg/dL   Hgb urine dipstick NEGATIVE NEGATIVE   Bilirubin Urine NEGATIVE NEGATIVE   Ketones, ur NEGATIVE NEGATIVE mg/dL   Protein, ur NEGATIVE NEGATIVE mg/dL   Urobilinogen, UA 1.0 0.0 - 1.0 mg/dL   Nitrite NEGATIVE NEGATIVE   Leukocytes, UA NEGATIVE NEGATIVE  I-Stat Chem 8, ED  Result Value Ref Range   Sodium 137 135 - 145 mmol/L   Potassium 4.3 3.5 - 5.1 mmol/L   Chloride 100 96 - 112 mmol/L   BUN 20 6 - 23 mg/dL   Creatinine, Ser 2.131.00 0.50 - 1.35 mg/dL   Glucose, Bld 086271 (H) 70 - 99 mg/dL   Calcium, Ion 5.781.12 (L) 1.13 - 1.30 mmol/L   TCO2 23 0 - 100 mmol/L   Hemoglobin 13.6 13.0 -  17.0 g/dL   HCT 16.1 09.6 - 04.5 %  I-Stat Troponin, ED (not at [facility])  Result Value Ref Range   Troponin i, poc 0.00 0.00 - 0.08 ng/mL   Comment 3           Mr Brain Wo Contrast 10/21/2014    CLINICAL DATA:  Dizziness for 1 day which has progressively gotten worse. History of cerebellar hematoma requiring evacuation. Initial encounter.  EXAM: MRI HEAD WITHOUT CONTRAST  TECHNIQUE: Multiplanar, multiecho pulse sequences of the brain and surrounding structures were obtained without intravenous contrast.  COMPARISON:  Most recent MR head 06/12/2013. Most recent CT head 06/11/2013.  FINDINGS: No evidence for acute infarction, acute hemorrhage, mass lesion, hydrocephalus, or extra-axial fluid. Moderate generalized cerebral and cerebellar atrophy. Mild to moderate changes of small vessel disease in the white matter. Tiny RIGHT pontine lacune. Prominent perivascular spaces consistent with hypertension.  Surgical encephalomalacia in the RIGHT cerebellum status post suboccipital craniectomy for evaluation of hematoma. Hemosiderin stained surgical bed and regional cerebellar soft tissues. Patulous fourth ventricle reflecting regional cerebellar volume loss.  No visible large vessel occlusion of the carotid, basilar, or vertebral arteries. BILATERAL MCA trunks maintain normal flow voids. No tonsillar herniation. Normal pituitary. Mild cervical spondylosis. Extracranial soft tissues unremarkable.  IMPRESSION: No acute intracranial findings. Postsurgical and post ischemic changes as described.  No evidence for large vessel occlusion, new posterior circulation infarct, or visible acute hemorrhage.   Electronically Signed   By: Davonna Belling M.D.   On: 10/21/2014 13:33    1510:  Pt and family reassured (MRI above). Pt not orthostatic on VS. Pt has ambulated with his baseline gait with assist x2 (walker unavailable). Pt states he feels better after meds and "knowing I didn't have another stroke." Pt wants to go home now. Pt's family states pt is at his baseline and would like to take him home now. Pt and family state he has enough rx antivert at home. Dx and testing d/w pt and family.  Questions answered.  Verb  understanding, agreeable to d/c home with outpt f/u.     Samuel Jester, DO 10/24/14 [phone]

## 2014-10-21 NOTE — ED Notes (Addendum)
Pt presents to ED via EMS due to dizziness x 1 day that has progressively gotten worse. Pt has hx of dizziness and is prescribed meclizine, has not taken that today. Hx of stroke in 2003 with RS weakness and speech is not 100% intelligible, which is also a deficit from previous stroke. Pt states this dizziness feels similar to when his previous stroke occurred. VSS. Pt from home, walks with a walker. Pt confused, alert to situation and person. Pt has feeding tube due to difficulty swallowing.

## 2015-04-13 ENCOUNTER — Encounter (HOSPITAL_COMMUNITY): Payer: Self-pay | Admitting: Emergency Medicine

## 2015-04-13 ENCOUNTER — Emergency Department (HOSPITAL_COMMUNITY)
Admission: EM | Admit: 2015-04-13 | Discharge: 2015-04-13 | Disposition: A | Discharge status: Home / Self Care | Payer: Medicare Other | Attending: Emergency Medicine | Admitting: Emergency Medicine

## 2015-04-13 DIAGNOSIS — Z794 Long term (current) use of insulin: Secondary | ICD-10-CM | POA: Insufficient documentation

## 2015-04-13 DIAGNOSIS — Z8673 Personal history of transient ischemic attack (TIA), and cerebral infarction without residual deficits: Secondary | ICD-10-CM | POA: Insufficient documentation

## 2015-04-13 DIAGNOSIS — Z87891 Personal history of nicotine dependence: Secondary | ICD-10-CM | POA: Diagnosis not present

## 2015-04-13 DIAGNOSIS — I10 Essential (primary) hypertension: Secondary | ICD-10-CM | POA: Insufficient documentation

## 2015-04-13 DIAGNOSIS — R531 Weakness: Secondary | ICD-10-CM | POA: Insufficient documentation

## 2015-04-13 DIAGNOSIS — B029 Zoster without complications: Secondary | ICD-10-CM | POA: Diagnosis not present

## 2015-04-13 DIAGNOSIS — Z79899 Other long term (current) drug therapy: Secondary | ICD-10-CM | POA: Diagnosis not present

## 2015-04-13 DIAGNOSIS — E1165 Type 2 diabetes mellitus with hyperglycemia: Secondary | ICD-10-CM | POA: Insufficient documentation

## 2015-04-13 DIAGNOSIS — R739 Hyperglycemia, unspecified: Secondary | ICD-10-CM

## 2015-04-13 DIAGNOSIS — Z880 Allergy status to penicillin: Secondary | ICD-10-CM | POA: Insufficient documentation

## 2015-04-13 DIAGNOSIS — R21 Rash and other nonspecific skin eruption: Secondary | ICD-10-CM | POA: Diagnosis present

## 2015-04-13 LAB — CBG MONITORING, ED
GLUCOSE-CAPILLARY: 345 mg/dL — AB (ref 65–99)
Glucose-Capillary: 317 mg/dL — ABNORMAL HIGH (ref 65–99)

## 2015-04-13 MED ORDER — HYDROCODONE-ACETAMINOPHEN 7.5-325 MG/15ML PO SOLN: 15.0000 mL | Freq: Four times a day (QID) | ORAL | Status: AC | PRN

## 2015-04-13 MED ORDER — ACYCLOVIR 200 MG/5ML PO SUSP: 800.0000 mg | Freq: Every day | ORAL | Status: DC

## 2015-04-13 MED ORDER — INSULIN ASPART 100 UNIT/ML ~~LOC~~ SOLN
5.0000 [IU] | Freq: Once | SUBCUTANEOUS | Status: AC
  Administered 2015-04-13: 5 [IU] via SUBCUTANEOUS
  Filled 2015-04-13: qty 1

## 2015-04-13 NOTE — ED Provider Notes (Signed)
CSN: [id number]     Arrival date & time 04/13/15  1037 History   First MD Initiated Contact with Patient 04/13/15 1256     Chief Complaint  Patient presents with  . Rash     (Consider location/radiation/quality/duration/timing/severity/associated sxs/prior Treatment) The history is provided by the patient.  Bill Jackson is a 80 y.o. male hx of HTN, HL, dysphagia s/p CVA with G tube, here with rash. Rash on the left neck area for the last 3 days. States that he has a burning sensation in that area. Denies any fevers or chills or abdominal pain. Denies any different shampoos or medicines. Has a history of chickenpox in the past. Denies sick contacts.    Past Medical History  Diagnosis Date  . Hypertension   . Diabetes mellitus   . Left shoulder pain   . Hyperlipidemia   . Right knee pain   . Dysphagia following cerebrovascular accident     PEG tube  . Stroke march 2014    R side weakness, slurred speech, dizziness, visual changes, ataxia; hemorrhagic cerebellar CVA, evacuated   Past Surgical History  Procedure Laterality Date  . Heart stents  x3  . Cardiac surgery    . Hernia repair    . Rotator cuff repair    . Craniectomy N/A 10/25/2012    Procedure: Suboccipital Craniectomy for Evacuation of Cerebellar Hematoma;  Surgeon: Maeola Harman, MD;  Location: [facility];  Service: Neurosurgery;  Laterality: N/A;  Suboccipital Craniectomy for Evacuation of Cerebellar Hematoma  . Peg tube placement      placed ?2014, changed 01/2014 (at [facility])   No family history on file. Social History  Substance Use Topics  . Smoking status: Former Smoker -- 1.00 packs/day for 25 years    Quit date: 04/03/2013  . Smokeless tobacco: None  . Alcohol Use: No    Review of Systems  Skin: Positive for rash.  All other systems reviewed and are negative.     Allergies  Lotensin and Penicillins  Home Medications   Prior to Admission medications   Medication Sig Start Date End Date Taking?  Authorizing Provider  acetaminophen (TYLENOL) 325 MG tablet Take 650 mg by mouth every 6 (six) hours as needed for mild pain.    Historical Provider, MD  brimonidine (ALPHAGAN) 0.2 % ophthalmic solution Place 1 drop into both eyes every 8 (eight) hours. 01/19/13   Lucrezia Starch, NP  clindamycin (CLEOCIN) 150 MG capsule Take 1 capsule (150 mg total) by mouth every 6 (six) hours. Patient not taking: Reported on 10/21/2014 06/01/14   Fayrene Helper, PA-C  dorzolamide-timolol (COSOPT) 22.3-6.8 MG/ML ophthalmic solution Place 1 drop into both eyes 2 (two) times daily.    Historical Provider, MD  folic acid (FOLVITE) 1 MG tablet Take 1 tablet (1 mg total) by mouth daily. 01/19/13   Lucrezia Starch, NP  hydrochlorothiazide (HYDRODIURIL) 25 MG tablet Take 1 tablet (25 mg total) by mouth daily. 01/19/13   Lucrezia Starch, NP  HYDROcodone-acetaminophen (NORCO/VICODIN) 5-325 MG per tablet Take 1 tablet by mouth every 6 (six) hours as needed. Patient not taking: Reported on 09/24/2014 06/01/14   Fayrene Helper, PA-C  insulin aspart (NOVOLOG) 100 UNIT/ML injection Inject 2-10 Units into the skin 3 (three) times daily before meals. 8am, noon and 4pm. Sliding scale.    Historical Provider, MD  insulin glargine (LANTUS) 100 UNIT/ML injection Inject 15 Units into the skin at bedtime.     Historical Provider, MD  latanoprost (XALATAN) 0.005 %  ophthalmic solution Place 1 drop into both eyes at bedtime. 01/19/13   Lucrezia Starch, NP  loratadine (CLARITIN) 10 MG tablet Take 10 mg by mouth daily.    Historical Provider, MD  losartan (COZAAR) 100 MG tablet Take 50 mg by mouth daily.    Historical Provider, MD  meclizine (ANTIVERT) 25 MG tablet Take 1 tablet (25 mg total) by mouth 2 (two) times daily. 01/19/13   Lucrezia Starch, NP  metoCLOPramide (REGLAN) 10 MG tablet Take 1 tablet (10 mg total) by mouth every 6 (six) hours as needed (for residuals in feeding tube). Patient not taking: Reported on 10/21/2014 02/02/14   Terri Piedra, PA-C   Multiple Vitamin (MULTIVITAMIN WITH MINERALS) TABS Take 1 tablet by mouth daily. Patient not taking: Reported on 10/21/2014 01/19/13   Lucrezia Starch, NP  pantoprazole (PROTONIX) 40 MG tablet Take 1 tablet (40 mg total) by mouth daily. 01/19/13   Lucrezia Starch, NP  potassium chloride 20 MEQ/15ML (10%) SOLN Take 20 mEq by mouth daily.    Historical Provider, MD   BP 116/65 mmHg  Pulse 75  Temp(Src) 98.1 F (36.7 C)  Resp 17  Ht 6' (1.829 m)  Wt 191 lb (86.637 kg)  BMI 25.90 kg/m2  SpO2 96% Physical Exam  Constitutional: He is oriented to person, place, and time.  Chronically ill, NAD   HENT:  Head: Normocephalic.  Right Ear: External ear normal.  Left Ear: External ear normal.  No obvious lesions in the ear or around the eyes   Eyes: Conjunctivae are normal. Pupils are equal, round, and reactive to light.  Neck: Normal range of motion. Neck supple.  + vesicular rash left side of neck extending to the jaw with various stages of healing   Cardiovascular: Normal rate, regular rhythm and normal heart sounds.   Pulmonary/Chest: Effort normal and breath sounds normal. No respiratory distress. He has no wheezes. He has no rales.  Abdominal: Soft. Bowel sounds are normal. He exhibits no distension. There is no tenderness. There is no rebound.  G tube in place   Musculoskeletal: Normal range of motion.  Neurological: He is alert and oriented to person, place, and time.  Left sided weakness (chronic)   Skin: Skin is warm. Rash noted.  Psychiatric: He has a normal mood and affect. His behavior is normal. Judgment and thought content normal.  Nursing note and vitals reviewed.   ED Course  Procedures (including critical care time) Labs Review Labs Reviewed  CBG MONITORING, ED - Abnormal; Notable for the following:    Glucose-Capillary 317 (*)    All other components within normal limits  CBG MONITORING, ED - Abnormal; Notable for the following:    Glucose-Capillary 345 (*)    All other  components within normal limits    Imaging Review No results found. I have personally reviewed and evaluated these images and lab results as part of my medical decision-making.   EKG Interpretation None      MDM   Final diagnoses:  None    Bill Jackson is a 80 y.o. male here with shingles on L neck. Chronically ill but not acutely ill. Didn't use insulin this morning so CBG 317. No vomiting or fever or abdominal pain to suggest DKA. Given subQ insulin, he also has insulin at home. Given valtrex, vicodin. Told him to stay away from small kids, people on chemo.   Richardean Canal, MD 04/13/15 [phone]

## 2015-04-13 NOTE — Discharge Instructions (Signed)
Take acyclovir 800 mg 5 times daily for a week.   Take hydrocodone as needed for pain.  Your blood sugar is high. Check it frequently and give yourself insulin.  See your doctor.   Return to ER if you have fever, worse rash, severe pain.    Shingles Shingles (herpes zoster) is an infection that is caused by the same virus that causes chickenpox (varicella). The infection causes a painful skin rash and fluid-filled blisters, which eventually break open, crust over, and heal. It may occur in any area of the body, but it usually affects only one side of the body or face. The pain of shingles usually lasts about 1 month. However, some people with shingles may develop long-term (chronic) pain in the affected area of the body. Shingles often occurs many years after the person had chickenpox. It is more common:  In people older than 50 years.  In people with weakened immune systems, such as those with HIV, AIDS, or cancer.  In people taking medicines that weaken the immune system, such as transplant medicines.  In people under great stress. CAUSES  Shingles is caused by the varicella zoster virus (VZV), which also causes chickenpox. After a person is infected with the virus, it can remain in the person's body for years in an inactive state (dormant). To cause shingles, the virus reactivates and breaks out as an infection in a nerve root. The virus can be spread from person to person (contagious) through contact with open blisters of the shingles rash. It will only spread to people who have not had chickenpox. When these people are exposed to the virus, they may develop chickenpox. They will not develop shingles. Once the blisters scab over, the person is no longer contagious and cannot spread the virus to others. SIGNS AND SYMPTOMS  Shingles shows up in stages. The initial symptoms may be pain, itching, and tingling in an area of the skin. This pain is usually described as burning, stabbing, or  throbbing.In a few days or weeks, a painful red rash will appear in the area where the pain, itching, and tingling were felt. The rash is usually on one side of the body in a band or belt-like pattern. Then, the rash usually turns into fluid-filled blisters. They will scab over and dry up in approximately 2-3 weeks. Flu-like symptoms may also occur with the initial symptoms, the rash, or the blisters. These may include:  Fever.  Chills.  Headache.  Upset stomach. DIAGNOSIS  Your health care provider will perform a skin exam to diagnose shingles. Skin scrapings or fluid samples may also be taken from the blisters. This sample will be examined under a microscope or sent to a lab for further testing. TREATMENT  There is no specific cure for shingles. Your health care provider will likely prescribe medicines to help you manage the pain, recover faster, and avoid long-term problems. This may include antiviral drugs, anti-inflammatory drugs, and pain medicines. HOME CARE INSTRUCTIONS   Take a cool bath or apply cool compresses to the area of the rash or blisters as directed. This may help with the pain and itching.   Take medicines only as directed by your health care provider.   Rest as directed by your health care provider.  Keep your rash and blisters clean with mild soap and cool water or as directed by your health care provider.  Do not pick your blisters or scratch your rash. Apply an anti-itch cream or numbing creams to the  affected area as directed by your health care provider.  Keep your shingles rash covered with a loose bandage (dressing).  Avoid skin contact with:  Babies.   Pregnant women.   Children with eczema.   Elderly people with transplants.   People with chronic illnesses, such as leukemia or AIDS.   Wear loose-fitting clothing to help ease the pain of material rubbing against the rash.  Keep all follow-up visits as directed by your health care  provider.If the area involved is on your face, you may receive a referral for a specialist, such as an eye doctor (ophthalmologist) or an ear, nose, and throat (ENT) doctor. Keeping all follow-up visits will help you avoid eye problems, chronic pain, or disability.  SEEK IMMEDIATE MEDICAL CARE IF:   You have facial pain, pain around the eye area, or loss of feeling on one side of your face.  You have ear pain or ringing in your ear.  You have loss of taste.  Your pain is not relieved with prescribed medicines.   Your redness or swelling spreads.   You have more pain and swelling.  Your condition is worsening or has changed.   You have a fever. MAKE SURE YOU:  Understand these instructions.  Will watch your condition.  Will get help right away if you are not doing well or get worse. Document Released: 07/20/2005 Document Revised: 12/04/2013 Document Reviewed: 03/03/2012 [facility] Patient Information 2015 [place], [place]. This information is not intended to replace advice given to you by your health care provider. Make sure you discuss any questions you have with your health care provider.

## 2015-04-13 NOTE — ED Notes (Signed)
Pt. Stated, Bill Jackson had this rash and it burns, for 3 days.

## 2015-06-29 ENCOUNTER — Emergency Department (HOSPITAL_COMMUNITY)
Admission: EM | Admit: 2015-06-29 | Discharge: 2015-06-30 | Disposition: A | Discharge status: Home / Self Care | Payer: Medicare Other | Attending: Emergency Medicine | Admitting: Emergency Medicine

## 2015-06-29 ENCOUNTER — Encounter (HOSPITAL_COMMUNITY): Payer: Self-pay | Admitting: Emergency Medicine

## 2015-06-29 DIAGNOSIS — Z794 Long term (current) use of insulin: Secondary | ICD-10-CM | POA: Insufficient documentation

## 2015-06-29 DIAGNOSIS — Z79899 Other long term (current) drug therapy: Secondary | ICD-10-CM | POA: Diagnosis not present

## 2015-06-29 DIAGNOSIS — I10 Essential (primary) hypertension: Secondary | ICD-10-CM | POA: Insufficient documentation

## 2015-06-29 DIAGNOSIS — Z880 Allergy status to penicillin: Secondary | ICD-10-CM | POA: Insufficient documentation

## 2015-06-29 DIAGNOSIS — Z8673 Personal history of transient ischemic attack (TIA), and cerebral infarction without residual deficits: Secondary | ICD-10-CM | POA: Diagnosis not present

## 2015-06-29 DIAGNOSIS — K9423 Gastrostomy malfunction: Secondary | ICD-10-CM

## 2015-06-29 DIAGNOSIS — E119 Type 2 diabetes mellitus without complications: Secondary | ICD-10-CM | POA: Insufficient documentation

## 2015-06-29 DIAGNOSIS — Z87891 Personal history of nicotine dependence: Secondary | ICD-10-CM | POA: Diagnosis not present

## 2015-06-29 NOTE — ED Provider Notes (Signed)
CSN: [id number]     Arrival date & time 06/29/15  2311 History   By signing my name below, I, Arlan Organshley Leger, attest that this documentation has been prepared under the direction and in the presence of Azalia BilisKevin Aamya Orellana, MD.  Electronically Signed: Arlan OrganAshley Leger, ED Scribe. 06/29/2015. 11:32 PM.   No chief complaint on file.  The history is provided by the patient. No language interpreter was used.    HPI Comments: Bill Jackson is a 80 y.o. male with a PMHx of dysphagia following CVA in 2014, HTN, and DM who presents to the Emergency Department here for a feeding tube replacement this evening. Pt states his tube came out approximately 1 hour prior to arrival. However, he states tube has not been changed in about 1 year. He denies any issues with previous tube once correct tubing sized was placed. No recent fever, chills, nausea, vomiting, chest pain, or shortness of breath. Bill Jackson began using G tubes 4 years ago.  PCP: Bill Jackson, ANNE D, MD    Past Medical History  Diagnosis Date  . Hypertension   . Diabetes mellitus   . Left shoulder pain   . Hyperlipidemia   . Right knee pain   . Dysphagia following cerebrovascular accident     PEG tube  . Stroke march 2014    R side weakness, slurred speech, dizziness, visual changes, ataxia; hemorrhagic cerebellar CVA, evacuated   Past Surgical History  Procedure Laterality Date  . Heart stents  x3  . Cardiac surgery    . Hernia repair    . Rotator cuff repair    . Craniectomy N/A 10/25/2012    Procedure: Suboccipital Craniectomy for Evacuation of Cerebellar Hematoma;  Surgeon: Maeola HarmanJoseph Stern, MD;  Location: [facility];  Service: Neurosurgery;  Laterality: N/A;  Suboccipital Craniectomy for Evacuation of Cerebellar Hematoma  . Peg tube placement      placed ?2014, changed 01/2014 (at [facility])   No family history on file. Social History  Substance Use Topics  . Smoking status: Former Smoker -- 1.00 packs/day for 25 years    Quit date: 04/03/2013   . Smokeless tobacco: Not on file  . Alcohol Use: No    Review of Systems  Gastrointestinal: Negative for nausea and vomiting.  Musculoskeletal: Negative for back pain.  Skin: Negative for rash.  Psychiatric/Behavioral: Negative for confusion.  All other systems reviewed and are negative.     Allergies  Lotensin and Penicillins  Home Medications   Prior to Admission medications   Medication Sig Start Date End Date Taking? Authorizing Provider  acetaminophen (TYLENOL) 325 MG tablet Take 650 mg by mouth every 6 (six) hours as needed for mild pain.    Historical Provider, MD  acyclovir (ZOVIRAX) 200 MG/5ML suspension Take 20 mLs (800 mg total) by mouth 5 (five) times daily. 04/13/15   Richardean Canalavid H Yao, MD  brimonidine (ALPHAGAN) 0.2 % ophthalmic solution Place 1 drop into both eyes every 8 (eight) hours. 01/19/13   Lucrezia StarchEdwina Berry, NP  clindamycin (CLEOCIN) 150 MG capsule Take 1 capsule (150 mg total) by mouth every 6 (six) hours. Patient not taking: Reported on 10/21/2014 06/01/14   Fayrene HelperBowie Tran, PA-C  dorzolamide-timolol (COSOPT) 22.3-6.8 MG/ML ophthalmic solution Place 1 drop into both eyes 2 (two) times daily.    Historical Provider, MD  folic acid (FOLVITE) 1 MG tablet Take 1 tablet (1 mg total) by mouth daily. 01/19/13   Lucrezia StarchEdwina Berry, NP  hydrochlorothiazide (HYDRODIURIL) 25 MG tablet Take 1 tablet (  25 mg total) by mouth daily. 01/19/13   Lucrezia Starch, NP  HYDROcodone-acetaminophen (HYCET) 7.5-325 mg/15 ml solution Take 15 mLs by mouth every 6 (six) hours as needed for moderate pain. 04/13/15 04/12/16  Richardean Canal, MD  insulin aspart (NOVOLOG) 100 UNIT/ML injection Inject 2-10 Units into the skin 3 (three) times daily before meals. 8am, noon and 4pm. Sliding scale.    Historical Provider, MD  insulin glargine (LANTUS) 100 UNIT/ML injection Inject 15 Units into the skin at bedtime.     Historical Provider, MD  latanoprost (XALATAN) 0.005 % ophthalmic solution Place 1 drop into both eyes at bedtime.  01/19/13   Lucrezia Starch, NP  loratadine (CLARITIN) 10 MG tablet Take 10 mg by mouth daily.    Historical Provider, MD  losartan (COZAAR) 100 MG tablet Take 50 mg by mouth daily.    Historical Provider, MD  meclizine (ANTIVERT) 25 MG tablet Take 1 tablet (25 mg total) by mouth 2 (two) times daily. 01/19/13   Lucrezia Starch, NP  metoCLOPramide (REGLAN) 10 MG tablet Take 1 tablet (10 mg total) by mouth every 6 (six) hours as needed (for residuals in feeding tube). Patient not taking: Reported on 10/21/2014 02/02/14   Terri Piedra, PA-C  Multiple Vitamin (MULTIVITAMIN WITH MINERALS) TABS Take 1 tablet by mouth daily. Patient not taking: Reported on 10/21/2014 01/19/13   Lucrezia Starch, NP  pantoprazole (PROTONIX) 40 MG tablet Take 1 tablet (40 mg total) by mouth daily. 01/19/13   Lucrezia Starch, NP  potassium chloride 20 MEQ/15ML (10%) SOLN Take 20 mEq by mouth daily.    Historical Provider, MD   Triage Vitals: There were no vitals taken for this visit.   Physical Exam  Constitutional: He is oriented to person, place, and time. He appears well-developed and well-nourished.  HENT:  Head: Normocephalic.  Eyes: EOM are normal.  Neck: Normal range of motion.  Pulmonary/Chest: Effort normal.  Abdominal: He exhibits no distension. There is no tenderness.  Abdomen non tender G tube insertion site without surrounding signs of infection  Musculoskeletal: Normal range of motion.  Neurological: He is alert and oriented to person, place, and time.  Psychiatric: He has a normal mood and affect.  Nursing note and vitals reviewed.   ED Course  Procedures (including critical care time)   Gastrostomy tube replacement Performed by: Lyanne Co Consent: Verbal consent obtained. Risks and benefits: risks, benefits and alternatives were discussed Required items: required blood products, implants, devices, and special equipment available Patient identity confirmed: hospital-assigned identification number Time  out: Immediately prior to procedure a "time out" was called to verify the correct patient, procedure, equipment, support staff and site/side marked as required. Preparation: Patient was prepped and draped in the usual sterile fashion. Patient tolerance: Patient tolerated the procedure well with no immediate complications. Comments: 22 french Gastrostomy tube placed without difficulty    DIAGNOSTIC STUDIES: Oxygen Saturation is 100% on RA, Normal by my interpretation.    COORDINATION OF CARE: 11:17 PM- Will place G tube. Discussed treatment plan with pt at bedside and pt agreed to plan.     Labs Review Labs Reviewed - No data to display  Imaging Review No results found. I have personally reviewed and evaluated these images and lab results as part of my medical decision-making.   EKG Interpretation None      MDM   Final diagnoses:  PEG tube malfunction (HCC)    12:26 AM g tube placed successfully.  I did have to dilate through  an 67 and 20 French Foley catheter before the 22 [place] G-tube would pass.  Once placed gastric contents were removed from the gastric outlet.  6 cc of air was placed into the balloon.  I personally performed the services described in this documentation, which was scribed in my presence. The recorded information has been reviewed and is accurate.       Azalia Bilis, MD 06/30/15 [phone]

## 2015-06-29 NOTE — ED Notes (Signed)
Pt states his feeding tube came out about an hour ago. Just found it on the ground.

## 2015-06-30 ENCOUNTER — Emergency Department (HOSPITAL_COMMUNITY)
Admission: EM | Admit: 2015-06-30 | Discharge: 2015-06-30 | Disposition: A | Discharge status: Home / Self Care | Payer: Medicare Other | Attending: Emergency Medicine | Admitting: Emergency Medicine

## 2015-06-30 ENCOUNTER — Encounter (HOSPITAL_COMMUNITY): Payer: Self-pay

## 2015-06-30 DIAGNOSIS — Z8673 Personal history of transient ischemic attack (TIA), and cerebral infarction without residual deficits: Secondary | ICD-10-CM | POA: Insufficient documentation

## 2015-06-30 DIAGNOSIS — E119 Type 2 diabetes mellitus without complications: Secondary | ICD-10-CM | POA: Insufficient documentation

## 2015-06-30 DIAGNOSIS — Z794 Long term (current) use of insulin: Secondary | ICD-10-CM | POA: Diagnosis not present

## 2015-06-30 DIAGNOSIS — K9420 Gastrostomy complication, unspecified: Secondary | ICD-10-CM | POA: Diagnosis present

## 2015-06-30 DIAGNOSIS — Z79899 Other long term (current) drug therapy: Secondary | ICD-10-CM | POA: Insufficient documentation

## 2015-06-30 DIAGNOSIS — Z87891 Personal history of nicotine dependence: Secondary | ICD-10-CM | POA: Insufficient documentation

## 2015-06-30 DIAGNOSIS — Z880 Allergy status to penicillin: Secondary | ICD-10-CM | POA: Insufficient documentation

## 2015-06-30 DIAGNOSIS — I10 Essential (primary) hypertension: Secondary | ICD-10-CM | POA: Insufficient documentation

## 2015-06-30 DIAGNOSIS — E785 Hyperlipidemia, unspecified: Secondary | ICD-10-CM | POA: Diagnosis not present

## 2015-06-30 LAB — CBG MONITORING, ED: Glucose-Capillary: 102 mg/dL — ABNORMAL HIGH (ref 65–99)

## 2015-06-30 NOTE — Discharge Instructions (Signed)
Gastrostomy Tube Home Guide, Adult °A gastrostomy tube is a tube that is surgically placed into the stomach. It is also called a "G-tube." G-tubes are used when a person is unable to eat and drink enough on their own to stay healthy. The tube is inserted into the stomach through a small cut (incision) in the skin. This tube is used for: °· Feeding. °· Giving medication. °GASTROSTOMY TUBE CARE °· Wash your hands with soap and water. °· Remove the old dressing (if any). Some styles of G-tubes may need a dressing inserted between the skin and the G-tube. Other types of G-tubes do not require a dressing. Ask your health care provider if a dressing is needed. °· Check the area where the tube enters the skin (insertion site) for redness, swelling, or pus-like (purulent) drainage. A small amount of clear or tan liquid drainage is normal. Check to make sure scar tissue (skin) is not growing around the insertion site. This could have a raised, bumpy appearance. °· A cotton swab can be used to clean the skin around the tube: °¨ When the G-tube is first put in, a normal saline solution or water can be used to clean the skin. °¨ Mild soap and warm water can be used when the skin around the G-tube site has healed. °¨ Roll the cotton swab around the G-tube insertion site to remove any drainage or crusting at the insertion site. °STOMACH RESIDUALS °Feeding tube residuals are the amount of liquids that are in the stomach at any given time. Residuals may be checked before giving feedings, medications, or as instructed by your health care provider. °· Ask your health care provider if there are instances when you would not start tube feedings depending on the amount or type of contents withdrawn from the stomach. °· Check residuals by attaching a syringe to the G-tube and pulling back on the syringe plunger. Note the amount, and return the residual back into the stomach. °FLUSHING THE G-TUBE °· The G-tube should be periodically  flushed with clean warm water to keep it from clogging. °¨ Flush the G-tube after feedings or medications. Draw up 30 mL of warm water in a syringe. Connect the syringe to the G-tube and slowly push the water into the tube. °¨ Do not push feedings, medications, or flushes rapidly. Flush the G-tube gently and slowly. °¨ Only use syringes made for G-tubes to flush medications or feedings. °¨ Your health care provider may want the G-tube flushed more often or with more water. If this is the case, follow your health care provider's instructions. °FEEDINGS °Your health care provider will determine whether feedings are given as a bolus (a certain amount given at one time and at scheduled times) or whether feedings will be given continuously on a feeding pump.  °· Formulas should be given at room temperature. °· If feedings are continuous, no more than 4 hours worth of feedings should be placed in the feeding bag. This helps prevent spoilage or accidental excess infusion. °· Cover and place unused formula in the refrigerator. °· If feedings are continuous, stop the feedings when medications or flushes are given. Be sure to restart the feedings. °· Feeding bags and syringes should be replaced as instructed by your health care provider. °GIVING MEDICATION  °· In general, it is best if all medications are in a liquid form for G-tube administration. Liquid medications are less likely to clog the G-tube. °¨ Mix the liquid medication with 30 mL (or amount recommended by   your health care provider) of warm water. °¨ Draw up the medication into the syringe. °¨ Attach the syringe to the G-tube and slowly push the mixture into the G-tube. °¨ After giving the medication, draw up 30 mL of warm water in the syringe and slowly flush the G-tube. °· For pills or capsules, check with your health care provider first before crushing medications. Some pills are not effective if they are crushed. Some capsules are sustained-release  medications. °¨ If appropriate, crush the pill or capsule and mix with 30 mL of warm water. Using the syringe, slowly push the medication through the tube, then flush the tube with another 30 mL of tap water. °G-TUBE PROBLEMS °G-tube was pulled out. °· Cause: May have been pulled out accidentally. °· Solutions: Cover the opening with clean dressing and tape. Call your health care provider right away. The G-tube should be put in as soon as possible (within 4 hours) so the G-tube opening (tract) does not close. The G-tube needs to be put in at a health care setting. An X-ray needs to be done to confirm placement before the G-tube can be used again. °Redness, irritation, soreness, or foul odor around the gastrostomy site. °· Cause: May be caused by leakage or infection. °· Solutions: Call your health care provider right away. °Large amount of leakage of fluid or mucus-like liquid present (a large amount means it soaks clothing). °· Cause: Many reasons could cause the G-tube to leak. °· Solutions: Call your health care provider to discuss the amount of leakage. °Skin or scar tissue appears to be growing where tube enters skin.  °· Cause: Tissue growth may develop around the insertion site if the G-tube is moved or pulled on excessively. °· Solutions: Secure tube with tape so that excess movement does not occur. Call your health care provider. °G-tube is clogged. °· Cause: Thick formula or medication. °· Solutions: Try to slowly push warm water into the tube with a large syringe. Never try to push any object into the tube to unclog it. Do not force fluid into the G-tube. If you are unable to unclog the tube, call your health care provider right away. °TIPS °· Head of bed (HOB) position refers to the upright position of a person's upper body. °¨ When giving medications or a feeding bolus, keep the HOB up as told by your health care provider. Do this during the feeding and for 1 hour after the feeding or medication  administration. °¨ If continuous feedings are being given, it is best to keep the HOB up as told by your health care provider. When ADLs (activities of daily living) are performed and the HOB needs to be flat, be sure to turn the feeding pump off. Restart the feeding pump when the HOB is returned to the recommended height. °· Do not pull or put tension on the tube. °· To prevent fluid backflow, kink the G-tube before removing the cap or disconnecting a syringe. °· Check the G-tube length every day. Measure from the insertion site to the end of the G-tube. If the length is longer than previous measurements, the tube may be coming out. Call your health care provider if you notice increasing G-tube length. °· Oral care, such as brushing teeth, must be continued. °· You may need to remove excess air (vent) from the G-tube. Your health care provider will tell you if this is needed. °· Always call your health care provider if you have questions or problems with the   G-tube. °SEEK IMMEDIATE MEDICAL CARE IF:  °· You have severe abdominal pain, tenderness, or abdominal bloating (distension). °· You have nausea or vomiting. °· You are constipated or have problems moving your bowels. °· The G-tube insertion site is red, swollen, has a foul smell, or has yellow or brown drainage. °· You have difficulty breathing or shortness of breath. °· You have a fever. °· You have a large amount of feeding tube residuals. °· The G-tube is clogged and cannot be flushed. °MAKE SURE YOU:  °· Understand these instructions. °· Will watch your condition. °· Will get help right away if you are not doing well or get worse. °  °This information is not intended to replace advice given to you by your health care provider. Make sure you discuss any questions you have with your health care provider. °  °Document Released: 09/28/2001 Document Revised: 12/04/2014 Document Reviewed: 03/27/2013 °Elsevier Interactive Patient Education ©2016 [employer] ° °

## 2015-06-30 NOTE — ED Notes (Signed)
Per EMS- patient reports that his feeding tube came out last night and was [facility]. Today, the patient stateshe attempted to feed himself and the feeding tube came out, but no balloon at the end of the tube.

## 2015-06-30 NOTE — ED Provider Notes (Signed)
CSN: [id number]     Arrival date & time 06/30/15  1528 History   First MD Initiated Contact with Patient 06/30/15 1540     Chief Complaint  Patient presents with  . feeding tube out      (Consider location/radiation/quality/duration/timing/severity/associated sxs/prior Treatment) HPI 80 y.o. male presents after his G-tube became dislodged approximately 1 hour prior to arrival. He was seen last night after his G-tube came out and had a replacement placed. He states he did not see the balloon on the tube. He states there is no balloon on the tube when it was removed today. He denies any new abdominal pain, medical illness. Timing of symptoms constant. No exacerbating or alleviating factors. Past Medical History  Diagnosis Date  . Hypertension   . Diabetes mellitus   . Left shoulder pain   . Hyperlipidemia   . Right knee pain   . Dysphagia following cerebrovascular accident     PEG tube  . Stroke [facility](HCC) march 2014    R side weakness, slurred speech, dizziness, visual changes, ataxia; hemorrhagic cerebellar CVA, evacuated   Past Surgical History  Procedure Laterality Date  . Heart stents  x3  . Cardiac surgery    . Hernia repair    . Rotator cuff repair    . Craniectomy N/A 10/25/2012    Procedure: Suboccipital Craniectomy for Evacuation of Cerebellar Hematoma;  Surgeon: Maeola HarmanJoseph Stern, MD;  Location: [facility];  Service: Neurosurgery;  Laterality: N/A;  Suboccipital Craniectomy for Evacuation of Cerebellar Hematoma  . Peg tube placement      [facility] ?2014[facility]   Family History  Problem Relation Age of Onset  . Family history unknown: Yes   Social History  Substance Use Topics  . Smoking status: Former Smoker -- 1.00 packs/day for 25 years    Quit date: 04/03/2013  . Smokeless tobacco: None  . Alcohol Use: No    Review of Systems  All other systems reviewed and are negative.     Allergies  Lotensin and Penicillins  Home Medications   Prior to  Admission medications   Medication Sig Start Date End Date Taking? Authorizing Provider  acetaminophen (TYLENOL) 325 MG tablet Take 650 mg by mouth every 6 (six) hours as needed for mild pain.    Historical Provider, MD  acyclovir (ZOVIRAX) 200 MG/5ML suspension Take 20 mLs (800 mg total) by mouth 5 (five) times daily. 04/13/15   Richardean Canalavid H Yao, MD  brimonidine (ALPHAGAN) 0.2 % ophthalmic solution Place 1 drop into both eyes every 8 (eight) hours. 01/19/13   Lucrezia StarchEdwina Berry, NP  clindamycin (CLEOCIN) 150 MG capsule Take 1 capsule (150 mg total) by mouth every 6 (six) hours. Patient not taking: Reported on 10/21/2014 06/01/14   Fayrene HelperBowie Tran, PA-C  dorzolamide-timolol (COSOPT) 22.3-6.8 MG/ML ophthalmic solution Place 1 drop into both eyes 2 (two) times daily.    Historical Provider, MD  folic acid (FOLVITE) 1 MG tablet Take 1 tablet (1 mg total) by mouth daily. 01/19/13   Lucrezia StarchEdwina Berry, NP  hydrochlorothiazide (HYDRODIURIL) 25 MG tablet Take 1 tablet (25 mg total) by mouth daily. 01/19/13   Lucrezia StarchEdwina Berry, NP  HYDROcodone-acetaminophen (HYCET) 7.5-325 mg/15 ml solution Take 15 mLs by mouth every 6 (six) hours as needed for moderate pain. 04/13/15 04/12/16  Richardean Canalavid H Yao, MD  insulin aspart (NOVOLOG) 100 UNIT/ML injection Inject 2-10 Units into the skin 3 (three) times daily before meals. 8am, noon and 4pm. Sliding scale.    Historical Provider,  MD  insulin glargine (LANTUS) 100 UNIT/ML injection Inject 15 Units into the skin at bedtime.     Historical Provider, MD  latanoprost (XALATAN) 0.005 % ophthalmic solution Place 1 drop into both eyes at bedtime. 01/19/13   Lucrezia Starch, NP  loratadine (CLARITIN) 10 MG tablet Take 10 mg by mouth daily.    Historical Provider, MD  losartan (COZAAR) 100 MG tablet Take 50 mg by mouth daily.    Historical Provider, MD  meclizine (ANTIVERT) 25 MG tablet Take 1 tablet (25 mg total) by mouth 2 (two) times daily. 01/19/13   Lucrezia Starch, NP  metoCLOPramide (REGLAN) 10 MG tablet Take 1  tablet (10 mg total) by mouth every 6 (six) hours as needed (for residuals in feeding tube). Patient not taking: Reported on 10/21/2014 02/02/14   Terri Piedra, PA-C  Multiple Vitamin (MULTIVITAMIN WITH MINERALS) TABS Take 1 tablet by mouth daily. Patient not taking: Reported on 10/21/2014 01/19/13   Lucrezia Starch, NP  pantoprazole (PROTONIX) 40 MG tablet Take 1 tablet (40 mg total) by mouth daily. 01/19/13   Lucrezia Starch, NP  potassium chloride 20 MEQ/15ML (10%) SOLN Take 20 mEq by mouth daily.    Historical Provider, MD   There were no vitals taken for this visit. Physical Exam  Constitutional: He is oriented to person, place, and time. He appears well-developed and well-nourished.  HENT:  Head: Normocephalic and atraumatic.  Eyes: Conjunctivae and EOM are normal.  Neck: Normal range of motion. Neck supple.  Cardiovascular: Normal rate, regular rhythm and normal heart sounds.   Pulmonary/Chest: Effort normal and breath sounds normal. No respiratory distress.  Abdominal: He exhibits no distension. There is no tenderness. There is no rebound and no guarding.  Small amount of bleeding around G tube site  Musculoskeletal: Normal range of motion.  Neurological: He is alert and oriented to person, place, and time.  Skin: Skin is warm and dry.  Vitals reviewed.   ED Course  Gastrostomy tube replacement Date/Time: 06/30/2015 3:53 PM Performed by: Mirian Mo Authorized by: Mirian Mo Consent: Verbal consent obtained. Risks and benefits: risks, benefits and alternatives were discussed Local anesthesia used: no Patient sedated: no Patient tolerance: Patient tolerated the procedure well with no immediate complications   (including critical care time) Labs Review Labs Reviewed - No data to display  Imaging Review No results found. I have personally reviewed and evaluated these images and lab results as part of my medical decision-making.   EKG Interpretation None      MDM    Final diagnoses:  Complication of gastrostomy tube [facility])    80 y.o. male with pertinent PMH of prior CVA with dysphagia and chronic feeding tube dependence presents with recurrent dislodgment of gastric tube. Physical exam as above. Replaced uneventfully. I checked the tube and the balloon both of which were functioning appropriately. Unknown why the balloon was not inflated. Discharged home in stable condition..    I have reviewed all laboratory and imaging studies if ordered as above  1. Complication of gastrostomy tube [facility])         Mirian Mo, MD 06/30/15 [phone]

## 2015-06-30 NOTE — ED Notes (Signed)
Bed: [id number] Expected date:  Expected time:  Means of arrival:  Comments: EMS- feeding tube replaced after dislodgement

## 2015-06-30 NOTE — ED Notes (Signed)
Reviewed d/c instructions with pt, who voiced understanding. Called [facility] to transport pt back to home as he states he has to ambulate with a walker and cannot get up the front steps to his house without assistance. Pt strongly refused [facility] transport when they arrived, and requested to call a cab to save money. I advised pt against going by cab, but pt continued to insist. I escorted pt to the front lobby in a wheelchair so he could wait for his cab. Pt departed in NAD.

## 2016-09-07 ENCOUNTER — Encounter (HOSPITAL_COMMUNITY): Payer: Self-pay | Admitting: *Deleted

## 2016-09-07 ENCOUNTER — Emergency Department (HOSPITAL_COMMUNITY)
Admission: EM | Admit: 2016-09-07 | Discharge: 2016-09-07 | Disposition: A | Discharge status: Home / Self Care | Payer: Medicare Other | Attending: Emergency Medicine | Admitting: Emergency Medicine

## 2016-09-07 ENCOUNTER — Emergency Department (HOSPITAL_COMMUNITY): Payer: Medicare Other

## 2016-09-07 DIAGNOSIS — R1012 Left upper quadrant pain: Secondary | ICD-10-CM | POA: Insufficient documentation

## 2016-09-07 DIAGNOSIS — F17200 Nicotine dependence, unspecified, uncomplicated: Secondary | ICD-10-CM | POA: Insufficient documentation

## 2016-09-07 DIAGNOSIS — Z79899 Other long term (current) drug therapy: Secondary | ICD-10-CM | POA: Diagnosis not present

## 2016-09-07 DIAGNOSIS — L02212 Cutaneous abscess of back [any part, except buttock]: Secondary | ICD-10-CM | POA: Diagnosis present

## 2016-09-07 DIAGNOSIS — R109 Unspecified abdominal pain: Secondary | ICD-10-CM

## 2016-09-07 DIAGNOSIS — Z8673 Personal history of transient ischemic attack (TIA), and cerebral infarction without residual deficits: Secondary | ICD-10-CM | POA: Diagnosis not present

## 2016-09-07 DIAGNOSIS — Z794 Long term (current) use of insulin: Secondary | ICD-10-CM | POA: Insufficient documentation

## 2016-09-07 DIAGNOSIS — E119 Type 2 diabetes mellitus without complications: Secondary | ICD-10-CM | POA: Insufficient documentation

## 2016-09-07 DIAGNOSIS — I10 Essential (primary) hypertension: Secondary | ICD-10-CM | POA: Insufficient documentation

## 2016-09-07 DIAGNOSIS — L0291 Cutaneous abscess, unspecified: Secondary | ICD-10-CM

## 2016-09-07 LAB — COMPREHENSIVE METABOLIC PANEL
ALBUMIN: 3.6 g/dL (ref 3.5–5.0)
ALK PHOS: 57 U/L (ref 38–126)
ALT: 10 U/L — AB (ref 17–63)
ANION GAP: 11 (ref 5–15)
AST: 17 U/L (ref 15–41)
BILIRUBIN TOTAL: 0.9 mg/dL (ref 0.3–1.2)
BUN: 16 mg/dL (ref 6–20)
CO2: 27 mmol/L (ref 22–32)
CREATININE: 1.51 mg/dL — AB (ref 0.61–1.24)
Calcium: 9.3 mg/dL (ref 8.9–10.3)
Chloride: 100 mmol/L — ABNORMAL LOW (ref 101–111)
GFR calc non Af Amer: 42 mL/min — ABNORMAL LOW (ref >60)
GFR, EST AFRICAN AMERICAN: 49 mL/min — AB (ref >60)
GLUCOSE: 225 mg/dL — AB (ref 65–99)
Potassium: 4.5 mmol/L (ref 3.5–5.1)
Sodium: 138 mmol/L (ref 135–145)
TOTAL PROTEIN: 7.3 g/dL (ref 6.5–8.1)

## 2016-09-07 LAB — URINALYSIS, ROUTINE W REFLEX MICROSCOPIC
BILIRUBIN URINE: NEGATIVE
Glucose, UA: NEGATIVE mg/dL
HGB URINE DIPSTICK: NEGATIVE
KETONES UR: NEGATIVE mg/dL
Leukocytes, UA: NEGATIVE
NITRITE: NEGATIVE
PROTEIN: NEGATIVE mg/dL
SPECIFIC GRAVITY, URINE: 1.024 (ref 1.005–1.030)
pH: 5 (ref 5.0–8.0)

## 2016-09-07 LAB — CBC
HCT: 41.4 % (ref 39.0–52.0)
HEMOGLOBIN: 14.3 g/dL (ref 13.0–17.0)
MCH: 31.8 pg (ref 26.0–34.0)
MCHC: 34.5 g/dL (ref 30.0–36.0)
MCV: 92.2 fL (ref 78.0–100.0)
PLATELETS: 241 10*3/uL (ref 150–400)
RBC: 4.49 MIL/uL (ref 4.22–5.81)
RDW: 12.6 % (ref 11.5–15.5)
WBC: 8.2 10*3/uL (ref 4.0–10.5)

## 2016-09-07 LAB — I-STAT TROPONIN, ED: TROPONIN I, POC: 0.06 ng/mL (ref 0.00–0.08)

## 2016-09-07 LAB — LIPASE, BLOOD: Lipase: 39 U/L (ref 11–51)

## 2016-09-07 MED ORDER — IOPAMIDOL (ISOVUE-300) INJECTION 61%
INTRAVENOUS | Status: AC
Start: 2016-09-07 — End: 2016-09-07
  Administered 2016-09-07: 100 mL
  Filled 2016-09-07: qty 100

## 2016-09-07 MED ORDER — LIDOCAINE-EPINEPHRINE (PF) 2 %-1:200000 IJ SOLN
10.0000 mL | Freq: Once | INTRAMUSCULAR | Status: AC
  Administered 2016-09-07: 10 mL
  Filled 2016-09-07: qty 20

## 2016-09-07 MED ORDER — DOXYCYCLINE HYCLATE 100 MG PO TABS
100.0000 mg | ORAL_TABLET | Freq: Once | ORAL | Status: AC
  Administered 2016-09-07: 100 mg via ORAL
  Filled 2016-09-07: qty 1

## 2016-09-07 MED ORDER — DOXYCYCLINE HYCLATE 100 MG PO CAPS: 100.0000 mg | ORAL_CAPSULE | Freq: Two times a day (BID) | ORAL | 0 refills | Status: DC

## 2016-09-07 NOTE — ED Notes (Signed)
EKG given to Dr. Linker 

## 2016-09-07 NOTE — ED Triage Notes (Signed)
Pt with large abscess to center of back x 2 weeks.  Pt also c/o L sided abdominal pain that starts by his feeding tube and radiates to L lower back.

## 2016-09-07 NOTE — ED Notes (Signed)
Patient transported to CT 

## 2016-09-07 NOTE — ED Notes (Signed)
EDP at bedside to drain abscess

## 2016-09-07 NOTE — ED Notes (Signed)
ED Provider at bedside. 

## 2016-09-07 NOTE — ED Provider Notes (Signed)
[facility]-EMERGENCY DEPT Provider Note   CSN: [id number] Arrival date & time: 09/07/16  1011     History   Chief Complaint Chief Complaint  Patient presents with  . Abscess    HPI Bill Jackson is a 82 y.o. male.  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 Patient complains of a large abscess to the center of his back for the past 2 weeks. States that he gets these abscesses every couple of years to have to be drained. States that it became more painful, red, drainage for the past 2 weeks. Denies any fevers. Patient is a diabetic and on insulin. Patient also complains of left-sided abdominal pain. States it starts by his feeding tube and radiates to his left lower back. States he thinks he needs his feeding tube changed. Pain is not associated with food. He denies any nausea, vomiting, diarrhea. He denies any urinary symptoms. The pain is constant. He has not tried any home for the pain. Describes it as a sharp sensation. Patient denies history of same. He denies any fever, chills, headache, vision changes, lightheadedness, dizziness, chest pain, shortness of breath, nausea, emesis, urinary symptoms, change in bowel habits, numbness/tingling.      Past Medical History:  Diagnosis Date  . Diabetes mellitus   . Dysphagia following cerebrovascular accident    PEG tube  . Hyperlipidemia   . Hypertension   . Left shoulder pain   . Right knee pain   . Stroke [facility]) march 2014   R side weakness, slurred speech, dizziness, visual changes, ataxia; hemorrhagic cerebellar CVA, evacuated    Patient Active Problem List   Diagnosis Date Noted  . Left shoulder pain 07/11/2013  . Protein-calorie malnutrition, severe (HCC) 06/12/2013  . Syncope 06/11/2013  . Right shoulder pain 11/17/2012  . Right sided weakness 11/17/2012  . Low back pain  11/15/2012  . Glaucoma 11/03/2012  . Headache(784.0) 10/29/2012  . Cough 10/29/2012  . OA (osteoarthritis) of knee-right 10/28/2012  . Accelerated hypertension 10/28/2012  . Hypokalemia 10/28/2012  . Acute cerebellar hemorrhage (HCC) 10/28/2012  . Encounter for long-term (current) use of medications 10/28/2012  . Type 2 diabetes mellitus (HCC) 10/25/2012  . Hypertension 10/25/2012  . Stroke [facility]) 10/01/2012    Past Surgical History:  Procedure Laterality Date  . CARDIAC SURGERY    . CRANIECTOMY N/A 10/25/2012   Procedure: Suboccipital Craniectomy for Evacuation of Cerebellar Hematoma;  Surgeon: Maeola Harman, MD;  Location: [facility] NEURO ORS;  Service: Neurosurgery;  Laterality: N/A;  Suboccipital Craniectomy for Evacuation of Cerebellar Hematoma  . heart stents  x3  . HERNIA REPAIR    . PEG TUBE PLACEMENT     placed ?2014, changed 01/2014 (at [facility])  . ROTATOR CUFF REPAIR         Home Medications    Prior to Admission medications   Medication Sig Start Date End Date Taking? Authorizing Provider  acetaminophen (TYLENOL) 325 MG tablet Take 650 mg by mouth every 6 (six) hours as needed for mild pain.    Historical Provider, MD  acyclovir (ZOVIRAX) 200 MG/5ML suspension Take 20 mLs (800 mg total) by mouth 5 (five) times daily. 04/13/15   Charlynne Pander, MD  brimonidine (ALPHAGAN) 0.2 % ophthalmic solution Place 1 drop into both eyes every 8 (eight) hours. 01/19/13   Lucrezia Starch, NP  clindamycin (CLEOCIN) 150 MG capsule Take 1 capsule (150  mg total) by mouth every 6 (six) hours. Patient not taking: Reported on 10/21/2014 06/01/14   Fayrene HelperBowie Tran, PA-C  dorzolamide-timolol (COSOPT) 22.3-6.8 MG/ML ophthalmic solution Place 1 drop into both eyes 2 (two) times daily.    Historical Provider, MD  folic acid (FOLVITE) 1 MG tablet Take 1 tablet (1 mg total) by mouth daily. 01/19/13   Lucrezia StarchEdwina Berry, NP  hydrochlorothiazide (HYDRODIURIL) 25 MG tablet Take 1 tablet (25 mg total) by mouth daily. 01/19/13    Lucrezia StarchEdwina Berry, NP  insulin aspart (NOVOLOG) 100 UNIT/ML injection Inject 2-10 Units into the skin 3 (three) times daily before meals. 8am, noon and 4pm. Sliding scale.    Historical Provider, MD  insulin glargine (LANTUS) 100 UNIT/ML injection Inject 15 Units into the skin at bedtime.     Historical Provider, MD  latanoprost (XALATAN) 0.005 % ophthalmic solution Place 1 drop into both eyes at bedtime. 01/19/13   Lucrezia StarchEdwina Berry, NP  loratadine (CLARITIN) 10 MG tablet Take 10 mg by mouth daily.    Historical Provider, MD  losartan (COZAAR) 100 MG tablet Take 50 mg by mouth daily.    Historical Provider, MD  meclizine (ANTIVERT) 25 MG tablet Take 1 tablet (25 mg total) by mouth 2 (two) times daily. 01/19/13   Lucrezia StarchEdwina Berry, NP  metoCLOPramide (REGLAN) 10 MG tablet Take 1 tablet (10 mg total) by mouth every 6 (six) hours as needed (for residuals in feeding tube). Patient not taking: Reported on 10/21/2014 02/02/14   Terri Piedraourtney Forcucci, PA-C  Multiple Vitamin (MULTIVITAMIN WITH MINERALS) TABS Take 1 tablet by mouth daily. Patient not taking: Reported on 10/21/2014 01/19/13   Lucrezia StarchEdwina Berry, NP  pantoprazole (PROTONIX) 40 MG tablet Take 1 tablet (40 mg total) by mouth daily. 01/19/13   Lucrezia StarchEdwina Berry, NP  potassium chloride 20 MEQ/15ML (10%) SOLN Take 20 mEq by mouth daily.    Historical Provider, MD    Family History Family History  Problem Relation Age of Onset  . Family history unknown: Yes    Social History Social History  Substance Use Topics  . Smoking status: Current Every Day Smoker    Packs/day: 0.25    Years: 25.00    Last attempt to quit: 04/03/2013  . Smokeless tobacco: Never Used  . Alcohol use No     Allergies   Lotensin [benazepril hcl] and Penicillins   Review of Systems Review of Systems  Constitutional: Negative for chills and fever.  HENT: Negative for congestion.   Eyes: Negative for visual disturbance.  Respiratory: Negative for cough and shortness of breath.   Cardiovascular:  Negative for chest pain and palpitations.  Gastrointestinal: Positive for abdominal pain. Negative for blood in stool, diarrhea, nausea and vomiting.  Genitourinary: Negative for dysuria, flank pain, frequency, hematuria and urgency.  Musculoskeletal: Positive for back pain (due to abscess).  Skin: Positive for wound.  Neurological: Negative for dizziness, syncope, weakness, light-headedness and numbness.     Physical Exam Updated Vital Signs BP (!) 90/51 (BP Location: Right Arm)   Pulse 79   Temp 97.7 F (36.5 C) (Oral)   Resp 18   Ht 6' (1.829 m)   Wt 93 kg   SpO2 100%   BMI 27.80 kg/m   Physical Exam  Constitutional: He is oriented to person, place, and time. He appears well-developed and well-nourished. No distress.  HENT:  Head: Normocephalic and atraumatic.  Mouth/Throat: Oropharynx is clear and moist.  Eyes: Conjunctivae and EOM are normal. Pupils are equal, round, and reactive to light.  Right eye exhibits no discharge. Left eye exhibits no discharge. No scleral icterus.  Neck: Normal range of motion. Neck supple. No thyromegaly present.  Cardiovascular: Normal rate, regular rhythm, normal heart sounds and intact distal pulses.  Exam reveals no gallop and no friction rub.   No murmur heard. Pulmonary/Chest: Effort normal and breath sounds normal. No respiratory distress. He has no wheezes. He exhibits no tenderness.  Abdominal: Soft. Bowel sounds are normal. He exhibits no distension. There is tenderness in the left upper quadrant and left lower quadrant. There is no rigidity, no rebound, no guarding, no CVA tenderness, no tenderness at McBurney's point and negative Murphy's sign.  PEG tube in place without any signs of infection. No ttp around the area of the tube.  Musculoskeletal: Normal range of motion.  Lymphadenopathy:    He has no cervical adenopathy.  Neurological: He is alert and oriented to person, place, and time.  Skin: Skin is warm and dry. Capillary refill  takes less than 2 seconds.     Nursing note and vitals reviewed.    ED Treatments / Results  Labs (all labs ordered are listed, but only abnormal results are displayed) Labs Reviewed  COMPREHENSIVE METABOLIC PANEL - Abnormal; Notable for the following:       Result Value   Chloride 100 (*)    Glucose, Bld 225 (*)    Creatinine, Ser 1.51 (*)    ALT 10 (*)    GFR calc non Af Amer 42 (*)    GFR calc Af Amer 49 (*)    All other components within normal limits  LIPASE, BLOOD  CBC  URINALYSIS, ROUTINE W REFLEX MICROSCOPIC  I-STAT TROPOININ, ED    EKG  EKG Interpretation  Date/Time:  Monday September 07 2016 12:53:08 EST Ventricular Rate:  79 PR Interval:    QRS Duration: 86 QT Interval:  398 QTC Calculation: 457 R Axis:   -56 Text Interpretation:  Sinus rhythm Left anterior fascicular block Abnormal R-wave progression, early transition Consider anterior infarct No significant change since last tracing Confirmed by Karma Ganja  MD, MARTHA [id number]) on 09/07/2016 1:27:42 PM       Radiology Ct Abdomen Pelvis W Contrast  Result Date: 09/07/2016 CLINICAL DATA:  LEFT side abdominal pain starting near feeding tube and radiating to LEFT lower back, large abscess at center of back for 2 weeks, hypertension, diabetes mellitus, prior stroke EXAM: CT ABDOMEN AND PELVIS WITH CONTRAST TECHNIQUE: Multidetector CT imaging of the abdomen and pelvis was performed using the standard protocol following bolus administration of intravenous contrast. Sagittal and coronal MPR images reconstructed from axial data set. CONTRAST:  ISOVUE-300 IOPAMIDOL (ISOVUE-300) INJECTION 61% IV. No oral contrast administered. COMPARISON:  12/31/2004 FINDINGS: Lower chest: Lung bases clear Hepatobiliary: Multiple hypodense lesions within liver likely cysts, 3.3 x 2.5 cm image 10, several enlarged and several new since previous exam with near complete resolution of a 5 cm cyst at the lateral RIGHT lobe seen on the previous  study. Gallbladder unremarkable. No biliary dilatation. Pancreas: Normal appearance Spleen: Normal appearance Adrenals/Urinary Tract: Adrenal glands normal appearance. Kidneys and ureters normal appearance. Minimal bladder wall thickening with small RIGHT-side bladder diverticulum. No urinary tract calcification. Prostate gland unremarkable. Stomach/Bowel: Normal appendix. Scattered uncomplicated colonic diverticula. G-tube within stomach with suboptimal gastric distention at assessment of wall thickness. Bowel loops otherwise normal. Vascular/Lymphatic: Atherosclerotic calcifications aorta without aneurysm. No adenopathy. Reproductive: N/A Other: Small BILATERAL inguinal hernias containing fat. No free air or free fluid. Subcutaneous abscess LEFT  paraspinal at the level of diaphragm, 4.0 x 3.4 x 4.3 cm with surrounding infiltrative changes and overlying skin thickening. Observed infiltrative/inflammatory changes appear superficial to the muscular fascia. Musculoskeletal: No acute osseous findings. IMPRESSION: Subcutaneous abscess LEFT paraspinal region at the level of the diaphragm. Scattered colonic diverticulosis without evidence of diverticulitis. Small bladder diverticulum. Hepatic cysts. No additional definite acute intra-abdominal or intrapelvic abnormalities. Electronically Signed   By: Ulyses Southward M.D.   On: 09/07/2016 14:04    Procedures .Marland KitchenIncision and Drainage Date/Time: 09/07/2016 10:59 PM Performed by: Demetrios Loll T Authorized by: Demetrios Loll T   Consent:    Consent obtained:  Verbal   Consent given by:  Patient   Risks discussed:  Bleeding, damage to other organs, infection, incomplete drainage and pain   Alternatives discussed:  No treatment Location:    Type:  Abscess   Size:  2x3x3   Location:  Trunk   Trunk location:  Back Pre-procedure details:    Skin preparation:  Betadine Anesthesia (see MAR for exact dosages):    Anesthesia method:  Local infiltration   Local  anesthetic:  Lidocaine 1% WITH epi Procedure type:    Complexity:  Simple Procedure details:    Needle aspiration: no     Incision types:  Stab incision   Incision depth:  Subcutaneous   Scalpel blade:  11   Wound management:  Probed and deloculated and irrigated with saline   Drainage:  Purulent   Drainage amount:  Copious   Wound treatment:  Wound left open   Packing materials:  1/4 in iodoform gauze   Amount 1/4" iodoform:  3 Post-procedure details:    Patient tolerance of procedure:  Tolerated well, no immediate complications   (including critical care time)  Medications Ordered in ED Medications  lidocaine-EPINEPHrine (XYLOCAINE W/EPI) 2 %-1:200000 (PF) injection 10 mL (10 mLs Infiltration Given by Other 09/07/16 1249)  iopamidol (ISOVUE-300) 61 % injection (100 mLs  Contrast Given 09/07/16 1338)  doxycycline (VIBRA-TABS) tablet 100 mg (100 mg Oral Given 09/07/16 1602)     Initial Impression / Assessment and Plan / ED Course  I have reviewed the triage vital signs and the nursing notes.  Pertinent labs & imaging results that were available during my care of the patient were reviewed by me and considered in my medical decision making (see chart for details).     Pt presents to the ED with multiple complaints. Abscess noted to the mid thoracic region of back. Pt with history of abscess to back and need of IandD. Abscess drained in the ED with sig amount of purluent discharge expressed. Pt with sign improvement in pain. Due to surrounding cellulitis and hx of DM will place on doxy. Abscess packed. Encouraged packing change by home health nurse in 2 days. Lab unremarkable. No leukocytosis. Lipase normal. Creatine mildly elevated at 1.5. Baseline is 1.3 Will need recheck by PCP. Electrolytes normal. UA without signs of infection. Abd ct shows subcutaneous abscess. PEG tube in place. No other signs of acute abnormalities. Patient is nontoxic, nonseptic appearing, in no apparent distress.   Patient's pain and other symptoms adequately managed in emergency department.   Labs, imaging and vitals reviewed.  Patient does not meet the SIRS or Sepsis criteria.  On repeat exam patient does not have a surgical abdomin and there are no peritoneal signs.  No indication of appendicitis, bowel obstruction, bowel perforation, cholecystitis, diverticulitis.  Patient discharged home with symptomatic treatment and given strict instructions for follow-up with  their primary care physician.  I have also discussed reasons to return immediately to the ER.  Patient expresses understanding and agrees with plan. Pt seen and examined by DR. Linker who is agreeable to the above plan.    Final Clinical Impressions(s) / ED Diagnoses   Final diagnoses:  Abscess  Abdominal pain, unspecified abdominal location    New Prescriptions Discharge Medication List as of 09/07/2016  3:32 PM    START taking these medications   Details  doxycycline (VIBRAMYCIN) 100 MG capsule Take 1 capsule (100 mg total) by mouth 2 (two) times daily., Starting Mon 09/07/2016, Print         Rise Mu, PA-C 09/07/16 2302    Jerelyn Scott, MD 09/08/16 [phone]

## 2016-09-07 NOTE — ED Notes (Signed)
Care handoff to Gabe RN 

## 2016-09-07 NOTE — Discharge Instructions (Signed)
Please have your wound assessed by the nurse in one to 2 days. Will need the packing replaced. Please take antibiotics as prescribed. Please follow-up with your primary care doctor concerning your PEG tube concerns. Return to the ED if he developed any worsening symptoms including fever, worsening pain, worsening redness, worsening swelling.

## 2017-09-28 ENCOUNTER — Emergency Department (HOSPITAL_COMMUNITY): Payer: Medicare Other

## 2017-09-28 ENCOUNTER — Encounter (HOSPITAL_COMMUNITY): Payer: Self-pay | Admitting: Emergency Medicine

## 2017-09-28 ENCOUNTER — Emergency Department (HOSPITAL_COMMUNITY)
Admission: EM | Admit: 2017-09-28 | Discharge: 2017-09-28 | Disposition: A | Discharge status: Home / Self Care | Payer: Medicare Other | Attending: Physician Assistant | Admitting: Physician Assistant

## 2017-09-28 ENCOUNTER — Other Ambulatory Visit: Payer: Self-pay

## 2017-09-28 DIAGNOSIS — Z794 Long term (current) use of insulin: Secondary | ICD-10-CM | POA: Insufficient documentation

## 2017-09-28 DIAGNOSIS — E119 Type 2 diabetes mellitus without complications: Secondary | ICD-10-CM | POA: Diagnosis not present

## 2017-09-28 DIAGNOSIS — Z79899 Other long term (current) drug therapy: Secondary | ICD-10-CM | POA: Diagnosis not present

## 2017-09-28 DIAGNOSIS — R200 Anesthesia of skin: Secondary | ICD-10-CM | POA: Diagnosis present

## 2017-09-28 DIAGNOSIS — R42 Dizziness and giddiness: Secondary | ICD-10-CM | POA: Diagnosis not present

## 2017-09-28 DIAGNOSIS — I10 Essential (primary) hypertension: Secondary | ICD-10-CM | POA: Diagnosis not present

## 2017-09-28 DIAGNOSIS — Z8673 Personal history of transient ischemic attack (TIA), and cerebral infarction without residual deficits: Secondary | ICD-10-CM | POA: Insufficient documentation

## 2017-09-28 DIAGNOSIS — R29818 Other symptoms and signs involving the nervous system: Secondary | ICD-10-CM

## 2017-09-28 DIAGNOSIS — F17210 Nicotine dependence, cigarettes, uncomplicated: Secondary | ICD-10-CM | POA: Insufficient documentation

## 2017-09-28 LAB — COMPREHENSIVE METABOLIC PANEL
ALBUMIN: 3.6 g/dL (ref 3.5–5.0)
ALT: 13 U/L — ABNORMAL LOW (ref 17–63)
ANION GAP: 13 (ref 5–15)
AST: 20 U/L (ref 15–41)
Alkaline Phosphatase: 63 U/L (ref 38–126)
BILIRUBIN TOTAL: 0.8 mg/dL (ref 0.3–1.2)
BUN: 9 mg/dL (ref 6–20)
CALCIUM: 9.2 mg/dL (ref 8.9–10.3)
CO2: 25 mmol/L (ref 22–32)
Chloride: 104 mmol/L (ref 101–111)
Creatinine, Ser: 1.28 mg/dL — ABNORMAL HIGH (ref 0.61–1.24)
GFR calc non Af Amer: 51 mL/min — ABNORMAL LOW (ref >60)
GFR, EST AFRICAN AMERICAN: 59 mL/min — AB (ref >60)
GLUCOSE: 118 mg/dL — AB (ref 65–99)
POTASSIUM: 4.4 mmol/L (ref 3.5–5.1)
SODIUM: 142 mmol/L (ref 135–145)
TOTAL PROTEIN: 7.4 g/dL (ref 6.5–8.1)

## 2017-09-28 LAB — URINALYSIS, ROUTINE W REFLEX MICROSCOPIC
Bilirubin Urine: NEGATIVE
GLUCOSE, UA: NEGATIVE mg/dL
Hgb urine dipstick: NEGATIVE
Ketones, ur: NEGATIVE mg/dL
LEUKOCYTES UA: NEGATIVE
NITRITE: NEGATIVE
PH: 7 (ref 5.0–8.0)
Protein, ur: NEGATIVE mg/dL
SPECIFIC GRAVITY, URINE: 1.004 — AB (ref 1.005–1.030)

## 2017-09-28 LAB — CBC WITH DIFFERENTIAL/PLATELET
BASOS ABS: 0 10*3/uL (ref 0.0–0.1)
BASOS PCT: 0 %
Eosinophils Absolute: 0.1 10*3/uL (ref 0.0–0.7)
Eosinophils Relative: 2 %
HEMATOCRIT: 44.6 % (ref 39.0–52.0)
HEMOGLOBIN: 15.2 g/dL (ref 13.0–17.0)
LYMPHS PCT: 44 %
Lymphs Abs: 1.7 10*3/uL (ref 0.7–4.0)
MCH: 32.2 pg (ref 26.0–34.0)
MCHC: 34.1 g/dL (ref 30.0–36.0)
MCV: 94.5 fL (ref 78.0–100.0)
Monocytes Absolute: 0.3 10*3/uL (ref 0.1–1.0)
Monocytes Relative: 8 %
NEUTROS ABS: 1.8 10*3/uL (ref 1.7–7.7)
NEUTROS PCT: 46 %
Platelets: 256 10*3/uL (ref 150–400)
RBC: 4.72 MIL/uL (ref 4.22–5.81)
RDW: 12.9 % (ref 11.5–15.5)
WBC: 3.9 10*3/uL — ABNORMAL LOW (ref 4.0–10.5)

## 2017-09-28 LAB — CBG MONITORING, ED: Glucose-Capillary: 73 mg/dL (ref 65–99)

## 2017-09-28 NOTE — ED Notes (Signed)
Walked patient to the bathroom patient did well with his walker

## 2017-09-28 NOTE — ED Notes (Signed)
Pt aware of need for urine  

## 2017-09-28 NOTE — ED Notes (Signed)
Checked patient blood sugar it was 73 notified RN of blood sugar

## 2017-09-28 NOTE — Discharge Instructions (Signed)
We think the dizziness is from your old surgery.  Please follow-up with your primary care physician your physicians such as your neurologist at the [facility].

## 2017-09-28 NOTE — ED Provider Notes (Signed)
**Bill [name]** Bill Bill Jackson   CSN: [id number] Arrival date & time: 09/28/17  1023     History   Chief Complaint Chief Complaint  Patient presents with  . Numbness    HPI Bill Bill Jackson is a 83 y.o. male.  HPI   Patient is an 83 year old male presenting with vague nebulous symptoms.  He is here with caregiver.  Caregiver has not noticed any new changes.  Patient reports that he has had dizziness since 2013.  He reports that is not really any different but may be little bit worse today.  Patient ambulated completely normally with his walker up and down the hallway very quickly.  Patient also says sometimes his legs feel heavy.  He reports he is been seeing him at the [place] for this several times prior, but he is reporting that it is may be a little bit worse today.  Patient has no cough, no fever, no shortness of breath, no chest pain, no new neurologic symptoms at all.  His  Past Medical History:  Diagnosis Date  . Diabetes mellitus   . Dysphagia following cerebrovascular accident    PEG tube  . Hyperlipidemia   . Hypertension   . Left shoulder pain   . Right knee pain   . Stroke [facility]) march 2014   R side weakness, slurred speech, dizziness, visual changes, ataxia; hemorrhagic cerebellar CVA, evacuated    Patient Active Problem List   Diagnosis Date Noted  . Left shoulder pain 07/11/2013  . Protein-calorie malnutrition, severe (HCC) 06/12/2013  . Syncope 06/11/2013  . Right shoulder pain 11/17/2012  . Right sided weakness 11/17/2012  . Low back pain 11/15/2012  . Glaucoma 11/03/2012  . Headache(784.0) 10/29/2012  . Cough 10/29/2012  . OA (osteoarthritis) of knee-right 10/28/2012  . Accelerated hypertension 10/28/2012  . Hypokalemia 10/28/2012  . Acute cerebellar hemorrhage (HCC) 10/28/2012  . Encounter for long-term (current) use of medications 10/28/2012  . Type 2 diabetes mellitus (HCC) 10/25/2012  . Hypertension 10/25/2012  .  Stroke [facility]) 10/01/2012    Past Surgical History:  Procedure Laterality Date  . CARDIAC SURGERY    . CRANIECTOMY N/A 10/25/2012   Procedure: Suboccipital Craniectomy for Evacuation of Cerebellar Hematoma;  Surgeon: Maeola Harman, MD;  Location: [facility];  Service: Neurosurgery;  Laterality: N/A;  Suboccipital Craniectomy for Evacuation of Cerebellar Hematoma  . heart stents  x3  . HERNIA REPAIR    . PEG TUBE PLACEMENT     placed ?2014, changed 01/2014 (at [facility])  . ROTATOR CUFF REPAIR         Home Medications    Prior to Admission medications   Medication Sig Start Date End Date Taking? Authorizing Provider  acetaminophen (TYLENOL) 325 MG tablet Take 650 mg by mouth every 6 (six) hours as needed for mild pain.    [provider]  acyclovir (ZOVIRAX) 200 MG/5ML suspension Take 20 mLs (800 mg total) by mouth 5 (five) times daily. 04/13/15   Charlynne Pander, MD  brimonidine (ALPHAGAN) 0.2 % ophthalmic solution Place 1 drop into both eyes every 8 (eight) hours. 01/19/13   Lucrezia Starch, NP  clindamycin (CLEOCIN) 150 MG capsule Take 1 capsule (150 mg total) by mouth every 6 (six) hours. Patient not taking: Reported on 10/21/2014 06/01/14   Fayrene Helper, PA-C  dorzolamide-timolol (COSOPT) 22.3-6.8 MG/ML ophthalmic solution Place 1 drop into both eyes 2 (two) times daily.    [provider]  doxycycline (VIBRAMYCIN) 100 MG  capsule Take 1 capsule (100 mg total) by mouth 2 (two) times daily. 09/07/16   Rise Mu, PA-C  folic acid (FOLVITE) 1 MG tablet Take 1 tablet (1 mg total) by mouth daily. 01/19/13   Lucrezia Starch, NP  hydrochlorothiazide (HYDRODIURIL) 25 MG tablet Take 1 tablet (25 mg total) by mouth daily. 01/19/13   Lucrezia Starch, NP  insulin aspart (NOVOLOG) 100 UNIT/ML injection Inject 2-10 Units into the skin 3 (three) times daily before meals. 8am, noon and 4pm. Sliding scale.    [provider]  insulin glargine (LANTUS) 100 UNIT/ML injection Inject 15  Units into the skin at bedtime.     [provider]  latanoprost (XALATAN) 0.005 % ophthalmic solution Place 1 drop into both eyes at bedtime. 01/19/13   Lucrezia Starch, NP  loratadine (CLARITIN) 10 MG tablet Take 10 mg by mouth daily.    [provider]  losartan (COZAAR) 100 MG tablet Take 50 mg by mouth daily.    [provider]  meclizine (ANTIVERT) 25 MG tablet Take 1 tablet (25 mg total) by mouth 2 (two) times daily. 01/19/13   Lucrezia Starch, NP  metoCLOPramide (REGLAN) 10 MG tablet Take 1 tablet (10 mg total) by mouth every 6 (six) hours as needed (for residuals in feeding tube). Patient not taking: Reported on 10/21/2014 02/02/14   Terri Piedra, PA-C  Multiple Vitamin (MULTIVITAMIN WITH MINERALS) TABS Take 1 tablet by mouth daily. Patient not taking: Reported on 10/21/2014 01/19/13   Lucrezia Starch, NP  pantoprazole (PROTONIX) 40 MG tablet Take 1 tablet (40 mg total) by mouth daily. 01/19/13   Lucrezia Starch, NP  potassium chloride 20 MEQ/15ML (10%) SOLN Take 20 mEq by mouth daily.    [provider]    Family History Family History  Family history unknown: Yes    Social History Social History   Tobacco Use  . Smoking status: Current Every Day Smoker    Packs/day: 0.25    Years: 25.00    Pack years: 6.25    Last attempt to quit: 04/03/2013    Years since quitting: 4.4  . Smokeless tobacco: Never Used  Substance Use Topics  . Alcohol use: No  . Drug use: No     Allergies   Lotensin [benazepril hcl] and Penicillins   Review of Systems Review of Systems  Constitutional: Negative for activity change.  Respiratory: Negative for shortness of breath.   Cardiovascular: Negative for chest pain.  Gastrointestinal: Negative for abdominal pain.  Neurological: Positive for dizziness and weakness.  All other systems reviewed and are negative.    Physical Exam Updated Vital Signs BP 131/76   Pulse 80   Temp 97.8 F (36.6 C) (Oral)   Resp  18   SpO2 99%   Physical Exam  Constitutional: He is oriented to person, place, and time. He appears well-nourished.  HENT:  Head: Normocephalic.  Eyes: Conjunctivae are normal. Right eye exhibits no discharge. Left eye exhibits no discharge.  Cardiovascular: Normal rate and regular rhythm.  Pulmonary/Chest: Effort normal and breath sounds normal. No respiratory distress.  Abdominal:  G-tube present.  Soft otherwise.  No evidence of infection.  Neurological: He is oriented to person, place, and time.  Mild slurring of words.  Disrupted pupil on the right-hand side.  Dysmetria bilaterally right worse than left.  Skin: Skin is warm and dry. He is not diaphoretic.  Psychiatric: He has a normal mood and affect. His behavior is normal.     ED  Treatments / Results  Labs (all labs ordered are listed, but only abnormal results are displayed) Labs Reviewed  COMPREHENSIVE METABOLIC PANEL  CBC WITH DIFFERENTIAL/PLATELET  URINALYSIS, ROUTINE W REFLEX MICROSCOPIC    EKG  EKG Interpretation  Date/Time:  Tuesday September 28 2017 10:37:57 EST Ventricular Rate:  87 PR Interval:  180 QRS Duration: 104 QT Interval:  396 QTC Calculation: 476 R Axis:   -52 Text Interpretation:  Sinus rhythm with occasional Premature ventricular complexes Left anterior fascicular block Anterior infarct , age undetermined Abnormal ECG Normal sinus rhythm Confirmed by Corlis LeakMackuen, Elfrida Pixley ([id number]) on 09/28/2017 3:27:32 PM       Radiology No results found.  Procedures Procedures (including critical care time)  Medications Ordered in ED Medications - No data to display   Initial Impression / Assessment and Plan / ED Course  I have reviewed the triage vital signs and the nursing notes.  Pertinent labs & imaging results that were available during my care of the patient were reviewed by me and considered in my medical decision making (see chart for details).    Patient is an 83 year old male presenting with  vague nebulous symptoms.  He is here with caregiver.  Caregiver has not noticed any new changes.  Patient reports that he has had dizziness since 2013.  He reports that is not really any different but may be little bit worse today.  Patient ambulated completely normally with his walker up and down the hallway very quickly.  Patient also says sometimes his legs feel heavy.  He reports he is been seeing him at the [facility] for this several times prior, but he is reporting that it is may be a little bit worse today.  Patient has no cough, no fever, no shortness of breath, no chest pain, no new neurologic symptoms at all.  His  11:50 AM None of the symptoms today appear acute.  [facility] and [facility] told him to come to the emergency department.  Patient ambulating well.  Is possible patient had additional strokes (has very strong history of in the past) however he got no new neurologic findings on exam.  We will check and make sure the patient does not have electrolyte abnormalties, or evidence of infection.  Otherwise will have patient follow-up with outpatient neurology.  Reassuring physical exam and imaging.  Patient appears very well.  We will give him a new abdominal binder as requested and have him follow-up with primary care.  Final Clinical Impressions(s) / ED Diagnoses   Final diagnoses:  None    ED Discharge Orders    None       Abelino DerrickMackuen, Lenix Benoist Lyn, MD 09/28/17 1528

## 2017-09-28 NOTE — ED Triage Notes (Addendum)
Pt reports dizziness when he bends over and lies down flat and has residual numbness and symptoms of that from stroke 5 years ago. He states he woke up and "didn't feel right". He states he feels dizzy now. He states he felt weak when woke up this morning. States left leg felt like cramping. Went to bed last night around 9:00 last night. He is usually seen at [facility]. Speech is delayed per baseline. Right side is weak per baseline.

## 2018-04-09 ENCOUNTER — Emergency Department (HOSPITAL_COMMUNITY): Payer: Medicare Other

## 2018-04-09 ENCOUNTER — Emergency Department (HOSPITAL_COMMUNITY)
Admission: EM | Admit: 2018-04-09 | Discharge: 2018-04-09 | Disposition: A | Discharge status: Home / Self Care | Payer: Medicare Other | Attending: Emergency Medicine | Admitting: Emergency Medicine

## 2018-04-09 ENCOUNTER — Encounter (HOSPITAL_COMMUNITY): Payer: Self-pay

## 2018-04-09 DIAGNOSIS — Y939 Activity, unspecified: Secondary | ICD-10-CM | POA: Diagnosis not present

## 2018-04-09 DIAGNOSIS — X58XXXA Exposure to other specified factors, initial encounter: Secondary | ICD-10-CM | POA: Diagnosis not present

## 2018-04-09 DIAGNOSIS — Y929 Unspecified place or not applicable: Secondary | ICD-10-CM | POA: Diagnosis not present

## 2018-04-09 DIAGNOSIS — S0991XA Unspecified injury of ear, initial encounter: Secondary | ICD-10-CM | POA: Diagnosis not present

## 2018-04-09 DIAGNOSIS — I10 Essential (primary) hypertension: Secondary | ICD-10-CM | POA: Insufficient documentation

## 2018-04-09 DIAGNOSIS — E119 Type 2 diabetes mellitus without complications: Secondary | ICD-10-CM | POA: Diagnosis not present

## 2018-04-09 DIAGNOSIS — Z79899 Other long term (current) drug therapy: Secondary | ICD-10-CM | POA: Insufficient documentation

## 2018-04-09 DIAGNOSIS — Z794 Long term (current) use of insulin: Secondary | ICD-10-CM | POA: Insufficient documentation

## 2018-04-09 DIAGNOSIS — Y999 Unspecified external cause status: Secondary | ICD-10-CM | POA: Diagnosis not present

## 2018-04-09 DIAGNOSIS — R42 Dizziness and giddiness: Secondary | ICD-10-CM | POA: Diagnosis not present

## 2018-04-09 DIAGNOSIS — F17210 Nicotine dependence, cigarettes, uncomplicated: Secondary | ICD-10-CM | POA: Insufficient documentation

## 2018-04-09 LAB — URINALYSIS, ROUTINE W REFLEX MICROSCOPIC
BILIRUBIN URINE: NEGATIVE
GLUCOSE, UA: NEGATIVE mg/dL
HGB URINE DIPSTICK: NEGATIVE
KETONES UR: NEGATIVE mg/dL
Leukocytes, UA: NEGATIVE
Nitrite: NEGATIVE
PH: 6 (ref 5.0–8.0)
PROTEIN: NEGATIVE mg/dL
Specific Gravity, Urine: 1.004 — ABNORMAL LOW (ref 1.005–1.030)

## 2018-04-09 LAB — ETHANOL: Alcohol, Ethyl (B): <10 mg/dL (ref <10)

## 2018-04-09 LAB — COMPREHENSIVE METABOLIC PANEL
ALBUMIN: 3.6 g/dL (ref 3.5–5.0)
ALT: 15 U/L (ref 0–44)
ANION GAP: 7 (ref 5–15)
AST: 21 U/L (ref 15–41)
Alkaline Phosphatase: 56 U/L (ref 38–126)
BUN: 13 mg/dL (ref 8–23)
CALCIUM: 9.1 mg/dL (ref 8.9–10.3)
CO2: 28 mmol/L (ref 22–32)
Chloride: 105 mmol/L (ref 98–111)
Creatinine, Ser: 1.34 mg/dL — ABNORMAL HIGH (ref 0.61–1.24)
GFR calc Af Amer: 56 mL/min — ABNORMAL LOW (ref >60)
GFR calc non Af Amer: 48 mL/min — ABNORMAL LOW (ref >60)
GLUCOSE: 188 mg/dL — AB (ref 70–99)
POTASSIUM: 4.6 mmol/L (ref 3.5–5.1)
Sodium: 140 mmol/L (ref 135–145)
TOTAL PROTEIN: 6.8 g/dL (ref 6.5–8.1)
Total Bilirubin: 0.8 mg/dL (ref 0.3–1.2)

## 2018-04-09 LAB — I-STAT CHEM 8, ED
BUN: 14 mg/dL (ref 8–23)
CHLORIDE: 105 mmol/L (ref 98–111)
CREATININE: 1.3 mg/dL — AB (ref 0.61–1.24)
Calcium, Ion: 1.15 mmol/L (ref 1.15–1.40)
Glucose, Bld: 180 mg/dL — ABNORMAL HIGH (ref 70–99)
HEMATOCRIT: 44 % (ref 39.0–52.0)
Hemoglobin: 15 g/dL (ref 13.0–17.0)
Potassium: 4.5 mmol/L (ref 3.5–5.1)
SODIUM: 139 mmol/L (ref 135–145)
TCO2: 26 mmol/L (ref 22–32)

## 2018-04-09 LAB — CBC
HCT: 44.3 % (ref 39.0–52.0)
HEMOGLOBIN: 14.7 g/dL (ref 13.0–17.0)
MCH: 32.2 pg (ref 26.0–34.0)
MCHC: 33.2 g/dL (ref 30.0–36.0)
MCV: 97.1 fL (ref 78.0–100.0)
PLATELETS: 238 10*3/uL (ref 150–400)
RBC: 4.56 MIL/uL (ref 4.22–5.81)
RDW: 12.7 % (ref 11.5–15.5)
WBC: 4.3 10*3/uL (ref 4.0–10.5)

## 2018-04-09 LAB — DIFFERENTIAL
ABS IMMATURE GRANULOCYTES: 0 10*3/uL (ref 0.0–0.1)
BASOS ABS: 0 10*3/uL (ref 0.0–0.1)
BASOS PCT: 1 %
EOS ABS: 0 10*3/uL (ref 0.0–0.7)
Eosinophils Relative: 1 %
IMMATURE GRANULOCYTES: 0 %
Lymphocytes Relative: 42 %
Lymphs Abs: 1.8 10*3/uL (ref 0.7–4.0)
MONOS PCT: 11 %
Monocytes Absolute: 0.5 10*3/uL (ref 0.1–1.0)
NEUTROS ABS: 2 10*3/uL (ref 1.7–7.7)
NEUTROS PCT: 45 %

## 2018-04-09 LAB — RAPID URINE DRUG SCREEN, HOSP PERFORMED
AMPHETAMINES: NOT DETECTED
BARBITURATES: NOT DETECTED
BENZODIAZEPINES: NOT DETECTED
COCAINE: NOT DETECTED
OPIATES: NOT DETECTED
TETRAHYDROCANNABINOL: NOT DETECTED

## 2018-04-09 LAB — PROTIME-INR
INR: 0.99
PROTHROMBIN TIME: 13 s (ref 11.4–15.2)

## 2018-04-09 LAB — APTT: APTT: 26 s (ref 24–36)

## 2018-04-09 LAB — I-STAT TROPONIN, ED: Troponin i, poc: 0.01 ng/mL (ref 0.00–0.08)

## 2018-04-09 MED ORDER — LORAZEPAM 2 MG/ML IJ SOLN
1.0000 mg | Freq: Once | INTRAMUSCULAR | Status: AC
  Administered 2018-04-09: 1 mg via INTRAVENOUS
  Filled 2018-04-09: qty 1

## 2018-04-09 NOTE — Discharge Instructions (Addendum)
Avoid any Q-tips in your ear, follow-up next week with your primary care doctor or an ear nose and throat doctor if the symptoms persist

## 2018-04-09 NOTE — ED Triage Notes (Addendum)
Pt presents with sensation of water in his R ear that he noted today.  Pt reports he stuck a q-tip into R ear and noted blood.  Pt denies any pain at present.  Pt reports being dizzy from previous stroke that is "a little off" today.  During assessment, pt reports upon awakening at 0400 this morning, he noted his L hand wasn't able to scratch his face, reports his speech is not normal.

## 2018-04-09 NOTE — ED Notes (Signed)
Pt told of need for urine specimen. He feels like he is unable to provide sample at this time.

## 2018-04-09 NOTE — ED Provider Notes (Signed)
[name] EMERGENCY DEPARTMENT Provider Note   CSN: [id number] Arrival date & time: 04/09/18  1104     History   Chief Complaint Chief Complaint  Patient presents with  . Ear pain/stroke symptoms    HPI Bill Jackson is a 83 y.o. male.  HPI Patient presents to the emergency room with a couple of complaints.  Patient states he had a sensation that he had some water in his right ear today.  He put a Q-tip in there to relieve the irritation and then noted there was blood.  He denies any pain currently.  He denies any fevers or chills.  Patient has some chronic hearing deficits.  Has not noticed any significant change.  Patient also complains of dizziness and some difficulty with his speech being a little off.  Patient has a history of a stroke in the past that affected his coordination and his speech.  However, over the last few days he has felt that his symptoms have increased.  He feels like his balance is worse than usual.  He also feels like his speech is more slurred than usual.  Patient denies any focal weakness.  He has not fallen.  He denies any visual changes.  According to the medical records the patient's stroke in 2014 left him with right-sided weakness, slurred speech, dizziness, visual changes, and ataxia.  This was related to a hemorrhagic cerebellar infarct. Past Medical History:  Diagnosis Date  . Diabetes mellitus   . Dysphagia following cerebrovascular accident    PEG tube  . Hyperlipidemia   . Hypertension   . Left shoulder pain   . Right knee pain   . Stroke [facility]) march 2014   R side weakness, slurred speech, dizziness, visual changes, ataxia; hemorrhagic cerebellar CVA, evacuated    Patient Active Problem List   Diagnosis Date Noted  . Left shoulder pain 07/11/2013  . Protein-calorie malnutrition, severe (HCC) 06/12/2013  . Syncope 06/11/2013  . Right shoulder pain 11/17/2012  . Right sided weakness 11/17/2012  . Low back pain  11/15/2012  . Glaucoma 11/03/2012  . Headache(784.0) 10/29/2012  . Cough 10/29/2012  . OA (osteoarthritis) of knee-right 10/28/2012  . Accelerated hypertension 10/28/2012  . Hypokalemia 10/28/2012  . Acute cerebellar hemorrhage (HCC) 10/28/2012  . Encounter for long-term (current) use of medications 10/28/2012  . Type 2 diabetes mellitus (HCC) 10/25/2012  . Hypertension 10/25/2012  . Stroke [facility]) 10/01/2012    Past Surgical History:  Procedure Laterality Date  . CARDIAC SURGERY    . CRANIECTOMY N/A 10/25/2012   Procedure: Suboccipital Craniectomy for Evacuation of Cerebellar Hematoma;  Surgeon: Maeola Harman, MD;  Location: [facility];  Service: Neurosurgery;  Laterality: N/A;  Suboccipital Craniectomy for Evacuation of Cerebellar Hematoma  . heart stents  x3  . HERNIA REPAIR    . PEG TUBE PLACEMENT     placed ?2014, changed 01/2014 (at [facility])  . ROTATOR CUFF REPAIR          Home Medications    Prior to Admission medications   Medication Sig Start Date End Date Taking? Authorizing Provider  acetaminophen (TYLENOL) 325 MG tablet Take 650 mg by mouth every 6 (six) hours as needed for mild pain.   Yes [provider]  brimonidine (ALPHAGAN) 0.2 % ophthalmic solution Place 1 drop into both eyes every 8 (eight) hours. 01/19/13  Yes Lucrezia Starch, NP  dorzolamide-timolol (COSOPT) 22.3-6.8 MG/ML ophthalmic solution Place 1 drop into both eyes 2 (two) times daily.  Yes [provider]  hydrochlorothiazide (HYDRODIURIL) 25 MG tablet Take 1 tablet (25 mg total) by mouth daily. 01/19/13  Yes Lucrezia Starch, NP  insulin aspart (NOVOLOG) 100 UNIT/ML injection Inject 2-10 Units into the skin 3 (three) times daily before meals. 8am, noon and 4pm. Sliding scale by the clinic   Yes [provider]  insulin glargine (LANTUS) 100 UNIT/ML injection Inject 15 Units into the skin at bedtime.    Yes [provider]  latanoprost (XALATAN) 0.005 % ophthalmic solution Place  1 drop into both eyes at bedtime. 01/19/13  Yes Lucrezia Starch, NP  loratadine (CLARITIN) 10 MG tablet Take 10 mg by mouth daily.   Yes [provider]  losartan (COZAAR) 100 MG tablet Take 50 mg by mouth daily.   Yes [provider]  meclizine (ANTIVERT) 25 MG tablet Take 1 tablet (25 mg total) by mouth 2 (two) times daily. 01/19/13  Yes Lucrezia Starch, NP  potassium chloride 20 MEQ/15ML (10%) SOLN Take 20 mEq by mouth daily.   Yes [provider]  folic acid (FOLVITE) 1 MG tablet Take 1 tablet (1 mg total) by mouth daily. Patient not taking: Reported on 04/09/2018 01/19/13   Lucrezia Starch, NP  metoCLOPramide (REGLAN) 10 MG tablet Take 1 tablet (10 mg total) by mouth every 6 (six) hours as needed (for residuals in feeding tube). Patient not taking: Reported on 10/21/2014 02/02/14   Terri Piedra, PA-C  Multiple Vitamin (MULTIVITAMIN WITH MINERALS) TABS Take 1 tablet by mouth daily. Patient not taking: Reported on 10/21/2014 01/19/13   Lucrezia Starch, NP  pantoprazole (PROTONIX) 40 MG tablet Take 1 tablet (40 mg total) by mouth daily. Patient not taking: Reported on 04/09/2018 01/19/13   Lucrezia Starch, NP    Family History Family History  Family history unknown: Yes    Social History Social History   Tobacco Use  . Smoking status: Current Every Day Smoker    Packs/day: 0.25    Years: 25.00    Pack years: 6.25    Last attempt to quit: 04/03/2013    Years since quitting: 5.0  . Smokeless tobacco: Never Used  Substance Use Topics  . Alcohol use: No  . Drug use: No     Allergies   Lotensin [benazepril hcl] and Penicillins   Review of Systems Review of Systems  All other systems reviewed and are negative.    Physical Exam Updated Vital Signs BP 132/76   Pulse 68   Temp 98 F (36.7 C) (Oral)   Resp 16   Ht 1.829 m (6')   Wt 92.2 kg   SpO2 100%   BMI 27.57 kg/m   Physical Exam  Constitutional: He is oriented to person, place, and time. He appears  well-developed and well-nourished. No distress.  HENT:  Head: Normocephalic and atraumatic.  Right Ear: External ear normal. Right ear exhibits lacerations. Tympanic membrane is injected. No hemotympanum.  Left Ear: Tympanic membrane and external ear normal.  Mouth/Throat: Oropharynx is clear and moist.  Patient appears to have wax buildup and bloody debris in his right ear canal, small abrasion to the external auditory canal, no foreign body noted  Eyes: Conjunctivae are normal. Right eye exhibits no discharge. Left eye exhibits no discharge. No scleral icterus.  Neck: Neck supple. No tracheal deviation present.  Cardiovascular: Normal rate, regular rhythm and intact distal pulses.  Pulmonary/Chest: Effort normal and breath sounds normal. No stridor. No respiratory distress. He has no wheezes. He has no rales.  Abdominal: Soft. Bowel sounds are normal. He exhibits no distension. There is no tenderness. There is no rebound and no guarding.  Musculoskeletal: He exhibits no edema or tenderness.  Neurological: He is alert and oriented to person, place, and time. He has normal strength. No cranial nerve deficit (mild left facial droop, extraocular movements intact, slurred speech , dysarthria  ) or sensory deficit. He exhibits normal muscle tone. He displays no seizure activity. Coordination normal.  No pronator drift bilateral upper extrem, able to hold both legs off bed for 5 seconds, sensation intact in all extremities, no visual field cuts, no left or right sided neglect, abnormal finger-nose exam , no nystagmus noted   Skin: Skin is warm and dry. No rash noted.  Psychiatric: He has a normal mood and affect.  Nursing note and vitals reviewed.    ED Treatments / Results  Labs (all labs ordered are listed, but only abnormal results are displayed) Labs Reviewed  COMPREHENSIVE METABOLIC PANEL - Abnormal; Notable for the following components:      Result Value   Glucose, Bld 188 (*)     Creatinine, Ser 1.34 (*)    GFR calc non Af Amer 48 (*)    GFR calc Af Amer 56 (*)    All other components within normal limits  URINALYSIS, ROUTINE W REFLEX MICROSCOPIC - Abnormal; Notable for the following components:   Color, Urine STRAW (*)    Specific Gravity, Urine 1.004 (*)    All other components within normal limits  I-STAT CHEM 8, ED - Abnormal; Notable for the following components:   Creatinine, Ser 1.30 (*)    Glucose, Bld 180 (*)    All other components within normal limits  PROTIME-INR  APTT  CBC  DIFFERENTIAL  ETHANOL  RAPID URINE DRUG SCREEN, HOSP PERFORMED  I-STAT TROPONIN, ED  CBG MONITORING, ED    EKG EKG Interpretation  Date/Time:  Saturday April 09 2018 11:23:19 EDT Ventricular Rate:  80 PR Interval:  176 QRS Duration: 104 QT Interval:  404 QTC Calculation: 465 R Axis:   -50 Text Interpretation:  Normal sinus rhythm with sinus arrhythmia Left anterior fascicular block Minimal voltage criteria for LVH, may be normal variant Possible Lateral infarct , age undetermined Abnormal ECG No significant change since last tracing Confirmed by Linwood Dibbles [phone]) on 04/09/2018 1:05:25 PM   Radiology Ct Head Wo Contrast  Result Date: 04/09/2018 CLINICAL DATA:  Patient presents with sensation of water in his right ear EXAM: CT HEAD WITHOUT CONTRAST TECHNIQUE: Contiguous axial images were obtained from the base of the skull through the vertex without intravenous contrast. COMPARISON:  09/28/2017 FINDINGS: Brain: No evidence of acute infarction, hemorrhage, hydrocephalus, extra-axial collection or mass lesion/mass effect. Stable area of encephalomalacia in the right cerebellum from prior right occipital craniectomy. Vascular: Heavy calcific atherosclerotic disease of the intra cavernous carotid arteries. Skull: Normal. Negative for fracture or focal lesion. Sinuses/Orbits: Mild opacification of the ethmoid sinuses. The mastoid air cells are clear. Cerumen noted in the right  ear canal. Other: None. IMPRESSION: No acute intracranial abnormality. Stable encephalomalacia in the right cerebellum, post prior right occipital craniectomy. No evidence of mastoid effusion.  Cerumen in the right ear canal. Electronically Signed   By: Ted Mcalpine M.D.   On: 04/09/2018 13:35   Mr Brain Wo Contrast  Result Date: 04/09/2018 CLINICAL DATA:  Focal neuro deficit, greater than 6 hours, stroke suspected. New onset left facial numbness and slurred speech. EXAM: MRI HEAD WITHOUT CONTRAST TECHNIQUE:  Multiplanar, multiecho pulse sequences of the brain and surrounding structures were obtained without intravenous contrast. COMPARISON:  CT head without contrast 04/09/18. MRI brain 10/21/2014 FINDINGS: Brain: No acute infarct, hemorrhage, or mass lesion is present. Remote right cerebellar encephalomalacia is stable. Mild atrophy and moderate periventricular white matter changes are present bilaterally. The ventricles are proportionate to the degree of atrophy. Dilated perivascular spaces are present in the basal ganglia bilaterally. Remote lacunar infarcts are present in the pons bilaterally. Vascular: Flow is present in the major intracranial arteries. Skull and upper cervical spine: Suboccipital craniotomy is present. Skull base is within normal limits. Degenerative changes are noted in the upper cervical spine. Marrow signal is within normal limits. Sinuses/Orbits: Mild mucosal thickening is present in the ethmoid air cells. The paranasal sinuses and mastoid air cells are otherwise clear. Bilateral lens replacements are present. Globes and orbits are otherwise within normal limits. IMPRESSION: 1. No acute intracranial abnormality. 2. Stable cerebellar encephalomalacia following surgery. 3. Remote lacunar infarcts of the brainstem are stable. Electronically Signed   By: Marin Roberts M.D.   On: 04/09/2018 16:43    Procedures Procedures (including critical care time)  Medications Ordered  in ED Medications  LORazepam (ATIVAN) injection 1 mg (1 mg Intravenous Given 04/09/18 1542)     Initial Impression / Assessment and Plan / ED Course  I have reviewed the triage vital signs and the nursing notes.  Pertinent labs & imaging results that were available during my care of the patient were reviewed by me and considered in my medical decision making (see chart for details).  Clinical Course as of Apr 09 1712  Sat Apr 09, 2018  1352 Laboratory tests reviewed.  No clinically significant abnormalities noted.   [JK]  1353 CT scan shows old stroke.   [JK]  1359 Considering his persistent complaints, will mri to rule out occult cva   [JK]    Clinical Course User Index [JK] Linwood Dibbles, MD    Patient presented to the emergency room for evaluation of an ear injury as well as symptoms concerning for stroke.  Patient had definite neurologic findings on exam however he has had a prior stroke.  CT scan did not show any acute findings however because of his persistent symptoms MRI was performed.  MRI does not show any evidence of acute stroke.  At this time there does not appear to be any evidence of an acute emergency medical condition and the patient appears stable for discharge with appropriate outpatient follow up.  Patient also complained of ear pain.  It appears that he injured his ear with a Q-tip.  He did have a small amount of blood.  Recommended waiting till the injury heals and then consider up with a primary care to be reexamined.  Final Clinical Impressions(s) / ED Diagnoses   Final diagnoses:  Injury of external auditory canal, initial encounter  Dizziness    ED Discharge Orders    None       Linwood Dibbles, MD 04/09/18 [phone]

## 2018-04-09 NOTE — ED Notes (Signed)
Pt back from MRI 

## 2018-04-09 NOTE — ED Notes (Signed)
Patient transported to  mri 

## 2018-04-09 NOTE — ED Notes (Signed)
This RN entered the room to find the pt putting on his clothes trying to leave, this RN informed the pt that he cannot leave until the doctor reads ct results

## 2019-12-04 ENCOUNTER — Ambulatory Visit: Payer: Self-pay | Attending: Internal Medicine

## 2019-12-04 DIAGNOSIS — Z20822 Contact with and (suspected) exposure to covid-19: Secondary | ICD-10-CM | POA: Insufficient documentation

## 2019-12-06 LAB — SARS-COV-2, NAA 2 DAY TAT

## 2019-12-06 LAB — NOVEL CORONAVIRUS, NAA: SARS-CoV-2, NAA: NOT DETECTED

## 2020-10-30 ENCOUNTER — Emergency Department (HOSPITAL_COMMUNITY): Payer: Medicare PPO

## 2020-10-30 ENCOUNTER — Other Ambulatory Visit: Payer: Self-pay

## 2020-10-30 ENCOUNTER — Encounter (HOSPITAL_COMMUNITY): Payer: Self-pay

## 2020-10-30 ENCOUNTER — Observation Stay (HOSPITAL_COMMUNITY)
Admission: EM | Admit: 2020-10-30 | Discharge: 2020-11-01 | Disposition: A | Discharge status: Home / Self Care | Payer: Medicare PPO | Attending: Family Medicine | Admitting: Family Medicine

## 2020-10-30 DIAGNOSIS — Z794 Long term (current) use of insulin: Secondary | ICD-10-CM | POA: Insufficient documentation

## 2020-10-30 DIAGNOSIS — R2689 Other abnormalities of gait and mobility: Secondary | ICD-10-CM | POA: Insufficient documentation

## 2020-10-30 DIAGNOSIS — N1831 Chronic kidney disease, stage 3a: Secondary | ICD-10-CM | POA: Insufficient documentation

## 2020-10-30 DIAGNOSIS — I10 Essential (primary) hypertension: Secondary | ICD-10-CM | POA: Diagnosis present

## 2020-10-30 DIAGNOSIS — K611 Rectal abscess: Secondary | ICD-10-CM | POA: Diagnosis not present

## 2020-10-30 DIAGNOSIS — E1122 Type 2 diabetes mellitus with diabetic chronic kidney disease: Secondary | ICD-10-CM | POA: Insufficient documentation

## 2020-10-30 DIAGNOSIS — L0291 Cutaneous abscess, unspecified: Secondary | ICD-10-CM | POA: Diagnosis present

## 2020-10-30 DIAGNOSIS — L03317 Cellulitis of buttock: Principal | ICD-10-CM | POA: Diagnosis present

## 2020-10-30 DIAGNOSIS — Z79899 Other long term (current) drug therapy: Secondary | ICD-10-CM | POA: Diagnosis not present

## 2020-10-30 DIAGNOSIS — I129 Hypertensive chronic kidney disease with stage 1 through stage 4 chronic kidney disease, or unspecified chronic kidney disease: Secondary | ICD-10-CM | POA: Insufficient documentation

## 2020-10-30 DIAGNOSIS — F17200 Nicotine dependence, unspecified, uncomplicated: Secondary | ICD-10-CM | POA: Diagnosis not present

## 2020-10-30 DIAGNOSIS — Z20822 Contact with and (suspected) exposure to covid-19: Secondary | ICD-10-CM | POA: Insufficient documentation

## 2020-10-30 DIAGNOSIS — R531 Weakness: Secondary | ICD-10-CM

## 2020-10-30 DIAGNOSIS — E119 Type 2 diabetes mellitus without complications: Secondary | ICD-10-CM

## 2020-10-30 LAB — COMPREHENSIVE METABOLIC PANEL
ALT: 9 U/L (ref 0–44)
AST: 15 U/L (ref 15–41)
Albumin: 3.2 g/dL — ABNORMAL LOW (ref 3.5–5.0)
Alkaline Phosphatase: 57 U/L (ref 38–126)
Anion gap: 7 (ref 5–15)
BUN: 12 mg/dL (ref 8–23)
CO2: 28 mmol/L (ref 22–32)
Calcium: 8.8 mg/dL — ABNORMAL LOW (ref 8.9–10.3)
Chloride: 101 mmol/L (ref 98–111)
Creatinine, Ser: 1.33 mg/dL — ABNORMAL HIGH (ref 0.61–1.24)
GFR, Estimated: 53 mL/min — ABNORMAL LOW (ref >60)
Glucose, Bld: 173 mg/dL — ABNORMAL HIGH (ref 70–99)
Potassium: 4.1 mmol/L (ref 3.5–5.1)
Sodium: 136 mmol/L (ref 135–145)
Total Bilirubin: 1 mg/dL (ref 0.3–1.2)
Total Protein: 6.6 g/dL (ref 6.5–8.1)

## 2020-10-30 LAB — RESP PANEL BY RT-PCR (FLU A&B, COVID) ARPGX2
Influenza A by PCR: NEGATIVE
Influenza B by PCR: NEGATIVE
SARS Coronavirus 2 by RT PCR: NEGATIVE

## 2020-10-30 LAB — CBC WITH DIFFERENTIAL/PLATELET
Abs Immature Granulocytes: 0.02 10*3/uL (ref 0.00–0.07)
Basophils Absolute: 0 10*3/uL (ref 0.0–0.1)
Basophils Relative: 0 %
Eosinophils Absolute: 0 10*3/uL (ref 0.0–0.5)
Eosinophils Relative: 0 %
HCT: 40.4 % (ref 39.0–52.0)
Hemoglobin: 13.5 g/dL (ref 13.0–17.0)
Immature Granulocytes: 0 %
Lymphocytes Relative: 21 %
Lymphs Abs: 1.6 10*3/uL (ref 0.7–4.0)
MCH: 32.5 pg (ref 26.0–34.0)
MCHC: 33.4 g/dL (ref 30.0–36.0)
MCV: 97.1 fL (ref 80.0–100.0)
Monocytes Absolute: 0.8 10*3/uL (ref 0.1–1.0)
Monocytes Relative: 11 %
Neutro Abs: 4.9 10*3/uL (ref 1.7–7.7)
Neutrophils Relative %: 68 %
Platelets: 244 10*3/uL (ref 150–400)
RBC: 4.16 MIL/uL — ABNORMAL LOW (ref 4.22–5.81)
RDW: 12.3 % (ref 11.5–15.5)
WBC: 7.4 10*3/uL (ref 4.0–10.5)
nRBC: 0 % (ref 0.0–0.2)

## 2020-10-30 LAB — LACTIC ACID, PLASMA: Lactic Acid, Venous: 1.4 mmol/L (ref 0.5–1.9)

## 2020-10-30 MED ORDER — LATANOPROST 0.005 % OP SOLN
1.0000 [drp] | Freq: Every day | OPHTHALMIC | Status: DC
  Administered 2020-10-31: 1 [drp] via OPHTHALMIC
  Filled 2020-10-30: qty 2.5

## 2020-10-30 MED ORDER — INSULIN ASPART 100 UNIT/ML ~~LOC~~ SOLN
0.0000 [IU] | Freq: Three times a day (TID) | SUBCUTANEOUS | Status: DC
  Administered 2020-10-31: 2 [IU] via SUBCUTANEOUS
  Administered 2020-11-01: 3 [IU] via SUBCUTANEOUS

## 2020-10-30 MED ORDER — SODIUM CHLORIDE 0.9 % IV BOLUS
500.0000 mL | Freq: Once | INTRAVENOUS | Status: AC
  Administered 2020-10-30: 500 mL via INTRAVENOUS

## 2020-10-30 MED ORDER — LIDOCAINE-EPINEPHRINE-TETRACAINE (LET) TOPICAL GEL
3.0000 mL | Freq: Once | TOPICAL | Status: AC
  Administered 2020-10-30: 3 mL via TOPICAL
  Filled 2020-10-30: qty 3

## 2020-10-30 MED ORDER — LORATADINE 10 MG PO TABS
10.0000 mg | ORAL_TABLET | Freq: Every day | ORAL | Status: DC
  Administered 2020-10-31 – 2020-11-01 (×2): 10 mg via ORAL
  Filled 2020-10-30 (×2): qty 1

## 2020-10-30 MED ORDER — MORPHINE SULFATE (PF) 4 MG/ML IV SOLN
4.0000 mg | Freq: Once | INTRAVENOUS | Status: AC
  Administered 2020-10-30: 4 mg via INTRAVENOUS
  Filled 2020-10-30: qty 1

## 2020-10-30 MED ORDER — IOHEXOL 300 MG/ML  SOLN
75.0000 mL | Freq: Once | INTRAMUSCULAR | Status: AC | PRN
  Administered 2020-10-30: 75 mL via INTRAVENOUS

## 2020-10-30 MED ORDER — VANCOMYCIN HCL 1250 MG/250ML IV SOLN
1250.0000 mg | INTRAVENOUS | Status: DC
  Administered 2020-10-30 – 2020-10-31 (×2): 1250 mg via INTRAVENOUS
  Filled 2020-10-30 (×2): qty 250

## 2020-10-30 MED ORDER — BRIMONIDINE TARTRATE 0.2 % OP SOLN
1.0000 [drp] | Freq: Three times a day (TID) | OPHTHALMIC | Status: DC
  Administered 2020-10-31 – 2020-11-01 (×2): 1 [drp] via OPHTHALMIC
  Filled 2020-10-30: qty 5

## 2020-10-30 MED ORDER — NICOTINE 7 MG/24HR TD PT24
7.0000 mg | MEDICATED_PATCH | Freq: Every day | TRANSDERMAL | Status: DC
  Administered 2020-10-30 – 2020-11-01 (×3): 7 mg via TRANSDERMAL
  Filled 2020-10-30 (×3): qty 1

## 2020-10-30 MED ORDER — JEVITY 1.5 CAL/FIBER PO LIQD
237.0000 mL | Freq: Three times a day (TID) | ORAL | Status: DC
  Filled 2020-10-30 (×2): qty 237

## 2020-10-30 MED ORDER — TRAMADOL HCL 50 MG PO TABS: 25.0000 mg | ORAL_TABLET | Freq: Three times a day (TID) | ORAL | Status: DC | PRN

## 2020-10-30 MED ORDER — ENOXAPARIN SODIUM 40 MG/0.4ML ~~LOC~~ SOLN
40.0000 mg | SUBCUTANEOUS | Status: DC
  Administered 2020-11-01: 40 mg via SUBCUTANEOUS
  Filled 2020-10-30: qty 0.4

## 2020-10-30 MED ORDER — POLYETHYLENE GLYCOL 3350 17 G PO PACK: 17.0000 g | PACK | Freq: Every day | ORAL | Status: DC | PRN

## 2020-10-30 MED ORDER — DORZOLAMIDE HCL-TIMOLOL MAL 2-0.5 % OP SOLN
1.0000 [drp] | Freq: Two times a day (BID) | OPHTHALMIC | Status: DC
  Administered 2020-10-31 – 2020-11-01 (×3): 1 [drp] via OPHTHALMIC
  Filled 2020-10-30: qty 10

## 2020-10-30 MED ORDER — LACTATED RINGERS IV SOLN: INTRAVENOUS | Status: AC

## 2020-10-30 NOTE — H&P (Incomplete)
Family Medicine Teaching [facility] Admission History and Physical Service Pager: [phone]  Patient name: Bill Jackson Medical record number: [id number] Date of birth: [birth year] Age: 86 y.o. Gender: male  Primary Care Provider: Margit Hanks, MD Consultants: None Code Status: DNR Preferred Emergency Contact: Misty Stanley, daughter, [phone]  Chief Complaint: Painful boil  Assessment and Plan: Bill Jackson is a 86 y.o. male presenting with painful boil of right buttock and concern for cellulitis. 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  Patient seen in urgent care on 3/29 and prescribed doxycycline, patient took 2 doses but was continuing to have increasing pain and went to be further evaluated.  He had been told at the urgent care that he may need to have a surgeon to lance the area but it would take time to get into the office so he came to the ED.  Patient denied any other recent antibiotic use.  Did note history of prior abscesses on his back that required I&D.  No signs of systemic infection given lack of fevers and normal WBC and lactic acid of 1.4. In the ED, patient was noted to have significant induration without fluctuance and no drainable area.  CT abdomen pelvis was obtained and showed inflammatory changes in the subcutaneous tissue the right buttocks without fluid collection or abscess present.  In the ED patient did receive IV morphine for pain and was started on IV vancomycin.  Physical exam reassuring for nontoxic appearance and stable vital signs; notable for significant pain with palpation of the cellulitic/indurated area.  No signs of perirectal abscess or fistula and no external connection with rectum. - Admit to  FPTS, attending Dr. Miquel Dunn - Tylenol PRN, Tramadol 25mg  if Tylenol not effective - Continue IV vancomycin - Vitals per routine - Up with assistance - PT/OT eval and treat - Warm compresses PRN - F/u Blood cultures  HTN BP on admission normotensive 125/71. Home medications include losartan 50mg  and HCTZ 25mg  daily.  - Continue home Losartan and HCTZ - Continue to monitor BP   T2DM Glucose on admission 173.  Takes novolog 2-10U per SSI and Lantus 10-20U pending glucose levels at home (10U if <175, 15U 175-199, 20U if >200). Patient has tube feeds with Jevity. - CBGs qAC and QHS - sSSI - Consult to dietician for tube feeds and recommendations  CKD stage IIIa Creatinine on admission 1.33 with GFR 53. Baseline appears to be 1.3  Previous CVA: Chronic, stable Prior hemorrhagic cerebellar CVA in 2014 (s/p emergent suboccipital craniectomy) with residual right sided weakness and dysphagia s/p PEG tube placement 2016. Patient currently uses for feeding with Jevity 1.5 TID, patient reports that he sometimes will take the feeds orally if he is able to drink without choking, also takes his medications orally. Residual R sided weakness. Uses a walker to ambulate at baseline, following with PT.   Right foot fractures: Stable Patient has boot on right foot. States that he fell and and 2 fractures in his foot at the end of January and has been in the boot since that time. No records in Epic regarding the fractures. Patient reports he is to possibly get the boot off on 4/4, but is unsure.  - Continue to monitor pain -  Clarify follow-up plan with patient (I.e. provider and dates)   FEN/GI: Jevity 1.5cal/fiber TID per peg tube Prophylaxis: Lovenox  Disposition: Med-surg, observation, anticipate discharge home  History of Present Illness:  Bill Jackson is a 86 y.o. male presenting with worsening cellulitis of his right buttocks.   Initially started last week on Wednesday (3/23) and has progressive  increase in size and induration since onset. He reports significant pain in this region, unable to place full pressure on the area. He saw [facility] yesterday who felt he should be evaluated for I&D or surgery, but prescribed doxycycline in the interim. He has taken two tablets thus far. He decided to present to the ED after discussing wosening with his RN earlier today. Denies any fevers, chills, sweating, abdominal pain, or fatigue. No drainage from the area that he is aware of. He is eating and drinking a little bit less due to pain. He is having regular bowel movements and urinating without concern (reports chronic urgency, sometimes may not make it to the restroom in time), no dark or bloody stools.   When he is laying down, the pain isn't too bad. However when placing pressure is 10/10. He feels like the pain is already getting better with IV antibiotics.   He reports several previous similar episodes in the past, usually resolving with antibiotics, did have two requiring I&D. He has not require hospitalization or surgery for them in the past.   Of note, he also has a right foot injury ("2 breaks in my foot") after a fall in 08/2020. He is in a walking boot, going to see his provider on Monday, 4/4, to evaluate getting his boot off officially. He has chronic leg swelling at its baseline, follows with his PCP for this. He has a PT and RN/home aide (3 times weekly) that come to his house. Lives alone.       Erythema and induration. Patient unable to ambulate secondary to pain. Starting vanc, blood cultures before vanc    Review Of Systems: Per HPI with the following additions:   Review of Systems  Constitutional: Negative for chills, fatigue and fever.  HENT: Negative for congestion, rhinorrhea and sore throat.   Eyes: Negative for visual disturbance.  Respiratory: Negative for chest tightness and shortness of breath.   Cardiovascular: Negative for chest pain and  palpitations.  Gastrointestinal: Negative for abdominal pain, constipation, nausea and vomiting.  Genitourinary: Negative for difficulty urinating.  Neurological: Positive for weakness. Negative for light-headedness and headaches.       Residual right sided weakness from prior CVA     Patient Active Problem List   Diagnosis Date Noted  . Left shoulder pain 07/11/2013  . Protein-calorie malnutrition, severe (HCC) 06/12/2013  . Syncope 06/11/2013  . Right shoulder pain 11/17/2012  . Right sided weakness 11/17/2012  . Low back pain 11/15/2012  . Glaucoma 11/03/2012  . Headache(784.0) 10/29/2012  . Cough 10/29/2012  . OA (osteoarthritis) of knee-right 10/28/2012  . Accelerated hypertension 10/28/2012  . Hypokalemia 10/28/2012  . Acute cerebellar hemorrhage (HCC) 10/28/2012  . Encounter for long-term (current) use of medications 10/28/2012  . Type 2 diabetes mellitus (HCC) 10/25/2012  . Hypertension 10/25/2012  . Stroke [facility]) 10/01/2012    Past Medical History: Past Medical History:  Diagnosis Date  . Diabetes mellitus   . Dysphagia following cerebrovascular accident    PEG tube  . Hyperlipidemia   . Hypertension   . Left shoulder pain   . Right  knee pain   . Stroke [facility](HCC) march 2014   R side weakness, slurred speech, dizziness, visual changes, ataxia; hemorrhagic cerebellar CVA, evacuated    Past Surgical History: Past Surgical History:  Procedure Laterality Date  . CARDIAC SURGERY    . CRANIECTOMY N/A 10/25/2012   Procedure: Suboccipital Craniectomy for Evacuation of Cerebellar Hematoma;  Surgeon: Maeola HarmanJoseph Stern, MD;  Location: [facility] NEURO ORS;  Service: Neurosurgery;  Laterality: N/A;  Suboccipital Craniectomy for Evacuation of Cerebellar Hematoma  . heart stents  x3  . HERNIA REPAIR    . PEG TUBE PLACEMENT     [facility]2014[facility]  . ROTATOR CUFF REPAIR      Social History: Social History   Tobacco Use  . Smoking status: Current Every Day Smoker     Packs/day: 0.25    Years: 25.00    Pack years: 6.25    Last attempt to quit: 04/03/2013    Years since quitting: 7.5  . Smokeless tobacco: Never Used  Substance Use Topics  . Alcohol use: No  . Drug use: No   Additional social history: smokes 1/2 PPD, social alcohol use (maybe 3x/wk), no drug use  Please also refer to relevant sections of EMR.  Family History: Family History  Family history unknown: Yes    Allergies and Medications: Allergies  Allergen Reactions  . Lotensin [Benazepril Hcl] Cough  . Penicillins Other (See Comments)    pt becomes "nervous" Has patient had a PCN reaction causing immediate rash, facial/tongue/throat swelling, SOB or lightheadedness with hypotension:  Has patient had a PCN reaction causing severe rash involving mucus membranes or skin necrosis:  Has patient had a PCN reaction that required hospitalization  Has patient had a PCN reaction occurring within the last 10 years:  If all of the above answers are "NO", then may proceed with Cephalosporin use.    No current facility-administered medications on file prior to encounter.   Current Outpatient Medications on File Prior to Encounter  Medication Sig Dispense Refill  . acetaminophen (TYLENOL) 325 MG tablet Take 650 mg by mouth every 6 (six) hours as needed for mild pain.    . brimonidine (ALPHAGAN) 0.2 % ophthalmic solution Place 1 drop into both eyes every 8 (eight) hours. 5 mL 0  . dorzolamide-timolol (COSOPT) 22.3-6.8 MG/ML ophthalmic solution Place 1 drop into both eyes 2 (two) times daily.    . folic acid (FOLVITE) 1 MG tablet Take 1 tablet (1 mg total) by mouth daily. (Patient not taking: Reported on 04/09/2018) 30 tablet 0  . hydrochlorothiazide (HYDRODIURIL) 25 MG tablet Take 1 tablet (25 mg total) by mouth daily. 30 tablet 0  . insulin aspart (NOVOLOG) 100 UNIT/ML injection Inject 2-10 Units into the skin 3 (three) times daily before meals. 8am, noon and 4pm. Sliding scale by the clinic    .  insulin glargine (LANTUS) 100 UNIT/ML injection Inject 15 Units into the skin at bedtime.     Marland Kitchen. latanoprost (XALATAN) 0.005 % ophthalmic solution Place 1 drop into both eyes at bedtime. 2.5 mL 0  . loratadine (CLARITIN) 10 MG tablet Take 10 mg by mouth daily.    Marland Kitchen. losartan (COZAAR) 100 MG tablet Take 50 mg by mouth daily.    . meclizine (ANTIVERT) 25 MG tablet Take 1 tablet (25 mg total) by mouth 2 (two) times daily. 30 tablet 0  . metoCLOPramide (REGLAN) 10 MG tablet Take 1 tablet (10 mg total) by mouth every 6 (six) hours as needed (for  residuals in feeding tube). (Patient not taking: Reported on 10/21/2014) 30 tablet 0  . Multiple Vitamin (MULTIVITAMIN WITH MINERALS) TABS Take 1 tablet by mouth daily. (Patient not taking: Reported on 10/21/2014)    . pantoprazole (PROTONIX) 40 MG tablet Take 1 tablet (40 mg total) by mouth daily. (Patient not taking: Reported on 04/09/2018) 30 tablet 0  . potassium chloride 20 MEQ/15ML (10%) SOLN Take 20 mEq by mouth daily.      Objective: BP 107/62   Pulse 74   Temp 98.4 F (36.9 C) (Oral)   Resp 15   Ht 6' (1.829 m)   Wt 86.2 kg   SpO2 99%   BMI 25.77 kg/m  Exam: General -- oriented x3, pleasant and cooperative. Speech difficult to understand HEENT -- Head is normocephalic. Right pupil abnormal shape and mildly dilated. EOMI, arcuous senilis bilaterally. Ears, nose and throat were benign. Neck -- supple; no bruits. Integument -- intact. Area of induration and erythema on right buttock (described below)  Chest -- good expansion. Lungs clear to auscultation. Comfortable on room air, no increased WOB Cardiac -- RRR. No murmurs noted.  Abdomen -- soft, nontender. No masses palpable. Bowel sounds present. Gluteal -- 5cm area of induration on superior-medial aspect of right interior buttock, very tender to palpation, no evidence of connection with rectum, no fluctuance CNS -- cranial nerves II through XII grossly intact. Extremeties - no tenderness or  effusions noted. 4/5 strength of RUE, 5/5 strength in left sided extremities. Mild pedal edema bilaterally. Right foot in walking boot. Dorsalis pedis pulses present and symmetrical.    Labs and Imaging: CBC BMET  Recent Labs  Lab 10/30/20 1300  WBC 7.4  HGB 13.5  HCT 40.4  PLT 244   Recent Labs  Lab 10/30/20 1300  NA 136  K 4.1  CL 101  CO2 28  BUN 12  CREATININE 1.33*  GLUCOSE 173*  CALCIUM 8.8*     EKG: My own interpretation (not copied from electronic read) ***   CT PELVIS W CONTRAST  Result Date: 10/30/2020 CLINICAL DATA:  Rectal abscess. EXAM: CT PELVIS WITH CONTRAST TECHNIQUE: Multidetector CT imaging of the pelvis was performed using the standard protocol following the bolus administration of intravenous contrast. CONTRAST:  79mL OMNIPAQUE IOHEXOL 300 MG/ML  SOLN COMPARISON:  Dec 31, 2004. FINDINGS: Urinary Tract: Small diverticulum is seen arising from the right posterolateral portion of the urinary bladder. Bowel:  Unremarkable visualized pelvic bowel loops. Vascular/Lymphatic: Atherosclerosis of abdominal aorta and iliac arteries is noted. No adenopathy is noted. Reproductive:  Normal prostate. Other: Inflammatory changes are seen involving the subcutaneous tissues of the right buttocks medially consistent with cellulitis, but no fluid collection or abscess is noted. Musculoskeletal: No definite abnormality seen. IMPRESSION: Inflammatory changes are seen involving the subcutaneous tissues of the medial portions of the right buttocks, but no abscess or fluid collection is noted. There is no definite evidence of perianal or perirectal abscess. Aortic Atherosclerosis (ICD10-I70.0). Electronically Signed   By: Lupita Raider M.D.   On: 10/30/2020 15:55     Evelena Leyden, DO 10/30/2020, 8:45 PM PGY-1, [facility] Family Medicine FPTS Intern pager: [phone], text pages welcome

## 2020-10-30 NOTE — ED Triage Notes (Signed)
Pt BIB [facility] from home c/o an abscess on his buttocks. Pt states he first noticed it Wednesday and now it is painful to touch, big and hardened. Pt states pain is 10/10.

## 2020-10-30 NOTE — H&P (Addendum)
Family Medicine Teaching [facility] Admission History and Physical Service Pager: [phone]  Patient name: Bill HanksWillie J Jackson Medical record number: [id number] Date of birth: 23-Apr-1935 Age: 86 y.o. Gender: male  Primary Care Provider: Margit HanksAlexander, Anne D, MD Consultants: None Code Status: DNR Preferred Emergency Contact: Bill Jackson, daughter, [phone]  Chief Complaint: Painful boil  Assessment and Plan: Bill Jackson is a 86 y.o. male presenting with painful boil of right buttock and concern for cellulitis. 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  Patient reports 1 week history of "boil" on his right buttock with progressive induration and pain.  Patient seen in urgent care on 3/29 and prescribed doxycycline, patient took 2 doses but was continuing to have increasing pain and went to be further evaluated. Did note history of prior abscesses on his back that required I&Jackson. No signs of systemic infection given lack of fevers and normal WBC and lactic acid of 1.4. Physical exam reassuring for nontoxic appearance and stable vital signs; notable for significant pain with palpation of the cellulitic/indurated area.  No signs of perirectal abscess or fistula or external connection with rectum, consistent with CT abdomen pelvis showing soft tissue inflammation but no fluid collection or abscess present. At this time, do not believe patient needs surgical intervention given findings above, but will monitor for changes. While no abscess or purulent material, given rapid progression and recurrent history, will cover for MRSA. - Admit to FPTS, attending Dr. Miquel Jackson - Continue IV vancomycin (3/30-), de-escalate as appropriate - Tylenol PRN, Tramadol 25mg  if Tylenol not effective - Vitals per routine - Up with assistance - PT/OT eval and treat - Warm  compresses PRN - F/u Blood cultures  HTN BP on admission normotensive 125/71. Home medications include losartan 50mg  and HCTZ 25mg  daily, last taken 3/29.  - Hold home Losartan and HCTZ while normotensive, consider restart 3/31  - Continue to monitor BP   T2DM Glucose on admission 173.  Takes novolog 2-10U per SSI and Lantus 10-20U pending glucose levels at home (10U if <175, 15U 175-199, 20U if >200). Patient has tube feeds with Jevity. - CBGs qAC and QHS - sSSI  CKD stage IIIa: Chronic, stable Creatinine on admission 1.33 with GFR 53. Baseline appears to be 1.3, GFR in 2019 appeared to be around 50. Patient currently on vancomycin and has not had much oral intake in the last 36 hours. - Continue to monitor Creatinine and GFR - LR @ 3075mL/hr  Previous CVA  Dysphagia 2/2 CVA, s/p PEG tube: Chronic, stable Prior hemorrhagic cerebellar CVA in 2014 (s/p emergent suboccipital craniectomy) with residual right sided weakness and dysphagia s/p PEG tube placement 2016. Patient currently uses for feeding with Jevity 1.5 TID, patient reports that he sometimes will take the feeds orally if he is able to drink without choking, also takes his medications orally. Uses a walker to ambulate at baseline, following with PT.  - Consider adding low dose statin for secondary prevention (appears to have previous intolerance)  - Consult to dietician for tube feeds and recommendations - PT/OT - Continue Jevity 3 times daily per PEG   Right foot fractures: Stable Patient has boot on right foot. States that he fell and and 2 fractures in his foot at the end of January 2022 and has been in the boot since that time. No records in Epic regarding the fractures. Patient  reports he is to possibly get the boot off on 4/4, but is unsure.  - Continue to monitor pain - Clarify follow-up plan with patient (I.e. provider and dates)  Glaucoma Patient reports history of glaucoma, currently on nightly eyedrops. Home  medications include COSOPT 1 drop BID, Alphagan 0.2% 1 drop q8h, latanoprost 0.005% 1 drop QHS .  - Continue home Cosopt, Alphagan, and latanoprost   Seasonal Allergies Patient home medications include loratadine  daily. - Continue home loratadine  Tobacco use  Patient currently smokes 1/2 PPD. Patient interested in nicotine patch. - NicoDerm  patch   FEN/GI: Jevity 1.5cal/fiber TID per peg tube Prophylaxis: Lovenox  Disposition: Med-surg, observation, anticipate discharge home  History of Present Illness:  [name] is a 86 y.o. male presenting with worsening cellulitis of his right buttocks.   Patient reports that he had a boil that started up on Wednesday 3/23 with progressive increase in size and induration in the last 4 to 5 days.  He reports significant pain in the area and is unable to put full pressure on the area and has to sleep on his side due to the pain.  Patient reports that on 3/29 his home health nurse was concerned and told him to get evaluated; [facility] and was told that he may need an I&Jackson or surgery, which would require a prior authorization which could take a couple of days and was prescribed doxycycline in the interim.  Instead of waiting, patient decided to come to the ED today due to significant increase in pain.  He reported he is taken 2 tablets the doxycycline so far.  Denies any fevers, chills, sweating, abdominal pain, fatigue.  Reports that there has not been any drainage he is aware of from the area.  His oral intake has decreased a little bit due to pain.  Does report regular bowel movements and urination-does report chronic urgency upon wakening with history of prior incontinence but none recently.  Patient reports that since arrival initiation of antibiotics and pain medication he feels like the pain is getting a little bit better.  He still has significant pain when pressure is applied to the area.  Patient has had 2  "boils " in the past, states that they were located on his back and required I&Jackson followed by antibiotics, but no hospitalization..  Of note, patient has a right foot injury and reports "2 fractures in my foot" after a fall in January 2022.  He is currently in a walking boot and states he is supposed to see his provider on 4/4 to evaluate getting the boot off.  He does have chronic swelling of his legs at baseline and follows up with his PCP for this.  He reports that at home he has PT/RN/home aide that assist him at home a few times a week but lives alone.  In the ED, visualized cellulitis on buttocks with initial concern for perirectal abscess versus fistula, labs and CT abdomen/pelvis were obtained with no systemic signs of infection and no abscess on imaging.  Patient was given morphine and started on vancomycin.  HPI with the following additions:   Review of Systems  Constitutional: Negative for chills, fatigue and fever.  HENT: Negative for congestion, rhinorrhea and sore throat.   Eyes: Negative for visual disturbance.  Respiratory: Negative for chest tightness and shortness of breath.   Cardiovascular: Negative for chest pain and palpitations.  Gastrointestinal: Negative for abdominal pain, constipation,  nausea and vomiting.  Genitourinary: Negative for difficulty urinating.  Neurological: Positive for weakness. Negative for light-headedness and headaches.       Residual right sided weakness from prior CVA     Patient Active Problem List   Diagnosis Date Noted  . Left shoulder pain 07/11/2013  . Protein-calorie malnutrition, severe (HCC) 06/12/2013  . Syncope 06/11/2013  . Right shoulder pain 11/17/2012  . Right sided weakness 11/17/2012  . Low back pain 11/15/2012  . Glaucoma 11/03/2012  . Headache(784.0) 10/29/2012  . Cough 10/29/2012  . OA (osteoarthritis) of knee-right 10/28/2012  . Accelerated hypertension 10/28/2012  . Hypokalemia 10/28/2012  . Acute cerebellar  hemorrhage (HCC) 10/28/2012  . Encounter for long-term (current) use of medications 10/28/2012  . Type 2 diabetes mellitus (HCC) 10/25/2012  . Hypertension 10/25/2012  . Stroke [facility]) 10/01/2012    Past Medical History: Past Medical History:  Diagnosis Date  . Diabetes mellitus   . Dysphagia following cerebrovascular accident    PEG tube  . Hyperlipidemia   . Hypertension   . Left shoulder pain   . Right knee pain   . Stroke [facility]) march 2014   R side weakness, slurred speech, dizziness, visual changes, ataxia; hemorrhagic cerebellar CVA, evacuated    Past Surgical History: Past Surgical History:  Procedure Laterality Date  . CARDIAC SURGERY    . CRANIECTOMY N/A 10/25/2012   Procedure: Suboccipital Craniectomy for Evacuation of Cerebellar Hematoma;  Surgeon: Maeola Harman, MD;  Location: [facility] NEUR[facility] ORS;  Service: Neurosurgery;  Laterality: N/A;  Suboccipital Craniectomy for Evacuation of Cerebellar Hematoma  . heart stents  x3  . HERNIA REPAIR    . PEG TUBE PLACEMENT     placed ?2014, changed 01/2014 (at [facility])  . ROTATOR CUFF REPAIR      Social History: Social History   Tobacco Use  . Smoking status: Current Every Day Smoker    Packs/day: 0.25    Years: 25.00    Pack years: 6.25    Last attempt to quit: 04/03/2013    Years since quitting: 7.5  . Smokeless tobacco: Never Used  Substance Use Topics  . Alcohol use: No  . Drug use: No   Additional social history: smokes 1/2 PPD, social alcohol use (maybe 3x/wk), no drug use  Please also refer to relevant sections of EMR.  Family History: Family History  Family history unknown: Yes    Allergies and Medications: Allergies  Allergen Reactions  . Lotensin [Benazepril Hcl] Cough  . Penicillins Other (See Comments)    pt becomes "nervous" Has patient had a PCN reaction causing immediate rash, facial/tongue/throat swelling, SOB or lightheadedness with hypotension:  Has patient had a PCN reaction causing severe rash involving  mucus membranes or skin necrosis:  Has patient had a PCN reaction that required hospitalization  Has patient had a PCN reaction occurring within the last 10 years:  If all of the above answers are "NO", then may proceed with Cephalosporin use.    No current facility-administered medications on file prior to encounter.   Current Outpatient Medications on File Prior to Encounter  Medication Sig Dispense Refill  . acetaminophen (TYLENOL) 325 MG tablet Take 650 mg by mouth every 6 (six) hours as needed for mild pain.    . brimonidine (ALPHAGAN) 0.2 % ophthalmic solution Place 1 drop into both eyes every 8 (eight) hours. 5 mL 0  . dorzolamide-timolol (COSOPT) 22.3-6.8 MG/ML ophthalmic solution Place 1 drop into both eyes 2 (two) times daily.    Marland Kitchen  folic acid (FOLVITE) 1 MG tablet Take 1 tablet (1 mg total) by mouth daily. (Patient not taking: Reported on 04/09/2018) 30 tablet 0  . hydrochlorothiazide (HYDRODIURIL) 25 MG tablet Take 1 tablet (25 mg total) by mouth daily. 30 tablet 0  . insulin aspart (NOVOLOG) 100 UNIT/ML injection Inject 2-10 Units into the skin 3 (three) times daily before meals. 8am, noon and 4pm. Sliding scale by the clinic    . insulin glargine (LANTUS) 100 UNIT/ML injection Inject 15 Units into the skin at bedtime.     Marland Kitchen latanoprost (XALATAN) 0.005 % ophthalmic solution Place 1 drop into both eyes at bedtime. 2.5 mL 0  . loratadine (CLARITIN) 10 MG tablet Take 10 mg by mouth daily.    Marland Kitchen losartan (COZAAR) 100 MG tablet Take 50 mg by mouth daily.    . meclizine (ANTIVERT) 25 MG tablet Take 1 tablet (25 mg total) by mouth 2 (two) times daily. 30 tablet 0  . metoCLOPramide (REGLAN) 10 MG tablet Take 1 tablet (10 mg total) by mouth every 6 (six) hours as needed (for residuals in feeding tube). (Patient not taking: Reported on 10/21/2014) 30 tablet 0  . Multiple Vitamin (MULTIVITAMIN WITH MINERALS) TABS Take 1 tablet by mouth daily. (Patient not taking: Reported on 10/21/2014)    .  pantoprazole (PROTONIX) 40 MG tablet Take 1 tablet (40 mg total) by mouth daily. (Patient not taking: Reported on 04/09/2018) 30 tablet 0  . potassium chloride 20 MEQ/15ML (10%) SOLN Take 20 mEq by mouth daily.      Objective: BP 107/62   Pulse 74   Temp 98.4 F (36.9 C) (Oral)   Resp 15   Ht 6' (1.829 m)   Wt 86.2 kg   SpO2 99%   BMI 25.77 kg/m  Exam: General -- oriented x3, pleasant and cooperative. Speech difficult to understand HEENT -- Head is normocephalic. Right pupil abnormal shape and mildly dilated. EOMI, arcuous senilis bilaterally. Brown discoloration on tongue Neck -- supple; no bruits. Integument -- intact. Area of induration and erythema on right buttock (described below)  Chest -- good expansion. Lungs clear to auscultation. Comfortable on room air, no increased WOB Cardiac -- RRR. No murmurs noted.  Abdomen -- soft, nontender. No masses palpable. Bowel sounds present. Gluteal -- Approximate 4x5cm area of induration on superior-medial aspect of right interior buttock (within crease) with slight overlying erythema and warm to touch in comparison to surrounding skin. No evidence of connection with rectum. No fluctuance or pus-like drainage present. Very tender to light palpation. Accompanied by Dr. Annia Friendly for examination.  CNS -- Alert and oriented. Cranial nerves II through XII grossly intact.  Can move all extremities spontaneously. Extremeties - no tenderness or effusions noted. 4/5 strength of RUE, 5/5 strength in left sided extremities. Mild pedal edema bilaterally. Right foot in cam walking boot, toes warm and dry with sensation to distal extremities intact bilaterally.    Labs and Imaging: CBC BMET  Recent Labs  Lab 10/30/20 1300  WBC 7.4  HGB 13.5  HCT 40.4  PLT 244   Recent Labs  Lab 10/30/20 1300  NA 136  K 4.1  CL 101  CO2 28  BUN 12  CREATININE 1.33*  GLUCOSE 173*  CALCIUM 8.8*     EKG: Sinus rhythm, RBBB and LAFB  CT PELVIS W  CONTRAST  Result Date: 10/30/2020 CLINICAL DATA:  Rectal abscess. EXAM: CT PELVIS WITH CONTRAST TECHNIQUE: Multidetector CT imaging of the pelvis was performed using the standard protocol  following the bolus administration of intravenous contrast. CONTRAST:  62mL OMNIPAQUE IOHEXOL 300 MG/ML  SOLN COMPARISON:  Dec 31, 2004. FINDINGS: Urinary Tract: Small diverticulum is seen arising from the right posterolateral portion of the urinary bladder. Bowel:  Unremarkable visualized pelvic bowel loops. Vascular/Lymphatic: Atherosclerosis of abdominal aorta and iliac arteries is noted. No adenopathy is noted. Reproductive:  Normal prostate. Other: Inflammatory changes are seen involving the subcutaneous tissues of the right buttocks medially consistent with cellulitis, but no fluid collection or abscess is noted. Musculoskeletal: No definite abnormality seen. IMPRESSION: Inflammatory changes are seen involving the subcutaneous tissues of the medial portions of the right buttocks, but no abscess or fluid collection is noted. There is no definite evidence of perianal or perirectal abscess. Aortic Atherosclerosis (ICD10-I70.0). Electronically Signed   By: Lupita Raider M.Jackson.   On: 10/30/2020 15:55     Evelena Leyden, DO 10/30/2020, 8:45 PM PGY-1, [place] FPTS Intern pager: [phone], text pages welcome  FPTS Upper-Level Resident Addendum   I have independently interviewed and examined the patient. I have discussed the above with the original author and agree with their documentation. My edits for correction/addition/clarification are in green. Please see also any attending notes.    Allayne Stack, DO  Family Medicine PGY-3

## 2020-10-30 NOTE — ED Provider Notes (Signed)
[name] EMERGENCY DEPARTMENT Provider Note   CSN: [id number] Arrival date & time: 10/30/20  1152     History Chief Complaint  Patient presents with  . Abscess    Bill Jackson is a 86 y.o. male.  HPI   Patient with significant medical history of diabetes, PEG tube placement due to CVA, hypertension, frequent abscesses presents with a abscess on his buttock.  He endorses that he noticed t this abscess approximately 1 week ago, started off as a small bump but has progressed and gotten larger.  Endorses that it is painful to the touch, has become more red denies drainage or discharge from the wound.  Patient states he has had these in the past, generally they had a be drained but never surgically.  He denies systemic symptoms like fevers, chills, abdominal pain, nausea, vomiting, diarrhea, worsening constipation.  Patient endorses that he saw his primary care doctor yesterday and they started him on antibiotics, they were concerned that his abscess may have to be drained.  Patient does not remember antibiotics he is taking cannot find notes to collaborate this.  Patient denies alleviating factors.  Patient denies headaches, fevers, chills, shortness of breath, chest pain, abdominal pain, nausea, vomiting, diarrhea, worsening pedal edema.  Past Medical History:  Diagnosis Date  . Diabetes mellitus   . Dysphagia following cerebrovascular accident    PEG tube  . Hyperlipidemia   . Hypertension   . Left shoulder pain   . Right knee pain   . Stroke [facility]) march 2014   R side weakness, slurred speech, dizziness, visual changes, ataxia; hemorrhagic cerebellar CVA, evacuated    Patient Active Problem List   Diagnosis Date Noted  . Left shoulder pain 07/11/2013  . Protein-calorie malnutrition, severe (HCC) 06/12/2013  . Syncope 06/11/2013  . Right shoulder pain 11/17/2012  . Right sided weakness 11/17/2012  . Low back pain 11/15/2012  . Glaucoma 11/03/2012  .  Headache(784.0) 10/29/2012  . Cough 10/29/2012  . OA (osteoarthritis) of knee-right 10/28/2012  . Accelerated hypertension 10/28/2012  . Hypokalemia 10/28/2012  . Acute cerebellar hemorrhage (HCC) 10/28/2012  . Encounter for long-term (current) use of medications 10/28/2012  . Type 2 diabetes mellitus (HCC) 10/25/2012  . Hypertension 10/25/2012  . Stroke [facility]) 10/01/2012    Past Surgical History:  Procedure Laterality Date  . CARDIAC SURGERY    . CRANIECTOMY N/A 10/25/2012   Procedure: Suboccipital Craniectomy for Evacuation of Cerebellar Hematoma;  Surgeon: Maeola Harman, MD;  Location: [facility]UR[facility] ORS;  Service: Neurosurgery;  Laterality: N/A;  Suboccipital Craniectomy for Evacuation of Cerebellar Hematoma  . heart stents  x3  . HERNIA REPAIR    . PEG TUBE PLACEMENT     placed ?2014, changed 01/2014 (at [facility])  . ROTATOR CUFF REPAIR         Family History  Family history unknown: Yes    Social History   Tobacco Use  . Smoking status: Current Every Day Smoker    Packs/day: 0.25    Years: 25.00    Pack years: 6.25    Last attempt to quit: 04/03/2013    Years since quitting: 7.5  . Smokeless tobacco: Never Used  Substance Use Topics  . Alcohol use: No  . Drug use: No    Home Medications Prior to Admission medications   Medication Sig Start Date End Date Taking? Authorizing Provider  acetaminophen (TYLENOL) 325 MG tablet Take 650 mg by mouth every 6 (six) hours as needed for mild  pain.    [provider]  brimonidine (ALPHAGAN) 0.2 % ophthalmic solution Place 1 drop into both eyes every 8 (eight) hours. 01/19/13   Lucrezia Starch, NP  dorzolamide-timolol (COSOPT) 22.3-6.8 MG/ML ophthalmic solution Place 1 drop into both eyes 2 (two) times daily.    [provider]  folic acid (FOLVITE) 1 MG tablet Take 1 tablet (1 mg total) by mouth daily. Patient not taking: Reported on 04/09/2018 01/19/13   Lucrezia Starch, NP  hydrochlorothiazide (HYDRODIURIL) 25 MG tablet Take 1  tablet (25 mg total) by mouth daily. 01/19/13   Lucrezia Starch, NP  insulin aspart (NOVOLOG) 100 UNIT/ML injection Inject 2-10 Units into the skin 3 (three) times daily before meals. 8am, noon and 4pm. Sliding scale by the clinic    [provider]  insulin glargine (LANTUS) 100 UNIT/ML injection Inject 15 Units into the skin at bedtime.     [provider]  latanoprost (XALATAN) 0.005 % ophthalmic solution Place 1 drop into both eyes at bedtime. 01/19/13   Lucrezia Starch, NP  loratadine (CLARITIN) 10 MG tablet Take 10 mg by mouth daily.    [provider]  losartan (COZAAR) 100 MG tablet Take 50 mg by mouth daily.    [provider]  meclizine (ANTIVERT) 25 MG tablet Take 1 tablet (25 mg total) by mouth 2 (two) times daily. 01/19/13   Lucrezia Starch, NP  metoCLOPramide (REGLAN) 10 MG tablet Take 1 tablet (10 mg total) by mouth every 6 (six) hours as needed (for residuals in feeding tube). Patient not taking: Reported on 10/21/2014 02/02/14   Shirleen Schirmer, PA-C  Multiple Vitamin (MULTIVITAMIN WITH MINERALS) TABS Take 1 tablet by mouth daily. Patient not taking: Reported on 10/21/2014 01/19/13   Lucrezia Starch, NP  pantoprazole (PROTONIX) 40 MG tablet Take 1 tablet (40 mg total) by mouth daily. Patient not taking: Reported on 04/09/2018 01/19/13   Lucrezia Starch, NP  potassium chloride 20 MEQ/15ML (10%) SOLN Take 20 mEq by mouth daily.    [provider]    Allergies    Lotensin [benazepril hcl] and Penicillins  Review of Systems   Review of Systems  Constitutional: Negative for chills and fever.  HENT: Negative for congestion.   Respiratory: Negative for shortness of breath.   Cardiovascular: Negative for chest pain.  Gastrointestinal: Negative for abdominal pain, diarrhea, nausea and vomiting.  Genitourinary: Negative for enuresis and flank pain.  Musculoskeletal: Negative for back pain.  Skin: Negative for rash.       Abscess on his buttocks   Neurological: Negative for dizziness and headaches.  Hematological: Does not bruise/bleed easily.    Physical Exam Updated Vital Signs BP 107/62   Pulse 74   Temp 98.4 F (36.9 C) (Oral)   Resp 15   Ht 6' (1.829 m)   Wt 86.2 kg   SpO2 99%   BMI 25.77 kg/m   Physical Exam Vitals and nursing note reviewed. Exam conducted with a chaperone present.  Constitutional:      General: He is not in acute distress.    Appearance: He is not ill-appearing.     Comments: Patient is in a deconditioned state  HENT:     Head: Normocephalic and atraumatic.     Nose: No congestion.  Eyes:     Conjunctiva/sclera: Conjunctivae normal.  Cardiovascular:     Rate and Rhythm: Normal rate and regular rhythm.     Pulses: Normal pulses.     Heart sounds: No murmur heard. No  friction rub. No gallop.   Pulmonary:     Effort: No respiratory distress.     Breath sounds: No wheezing, rhonchi or rales.  Abdominal:     Palpations: Abdomen is soft.     Tenderness: There is no abdominal tenderness.     Comments: Patient is a noted PEG tube in place, there is no surrounding erythema or edema present.  Abdomen is soft nontender to palpation/.    Genitourinary:    Comments: With a chaperone present rectum was visualized, he has a noted 4 x 5 cm area in the gluteal folds, with slight surrounding erythema, it was warm to the touch, induration resent, no drainage or discharge present.  Unable to perform a digital rectal exam as patient was too uncomfortable. Musculoskeletal:     Comments: Patient has a noted cam boot on his right lower leg  Skin:    General: Skin is warm and dry.  Neurological:     Mental Status: He is alert.  Psychiatric:        Mood and Affect: Mood normal.     ED Results / Procedures / Treatments   Labs (all labs ordered are listed, but only abnormal results are displayed) Labs Reviewed  COMPREHENSIVE METABOLIC PANEL - Abnormal; Notable for the following components:      Result  Value   Glucose, Bld 173 (*)    Creatinine, Ser 1.33 (*)    Calcium 8.8 (*)    Albumin 3.2 (*)    GFR, Estimated 53 (*)    All other components within normal limits  CBC WITH DIFFERENTIAL/PLATELET - Abnormal; Notable for the following components:   RBC 4.16 (*)    All other components within normal limits  RESP PANEL BY RT-PCR (FLU A&B, COVID) ARPGX2  CULTURE, BLOOD (ROUTINE X 2)  CULTURE, BLOOD (ROUTINE X 2)  LACTIC ACID, PLASMA  LACTIC ACID, PLASMA    EKG EKG Interpretation  Date/Time:  Wednesday October 30 2020 14:36:09 EDT Ventricular Rate:  85 PR Interval:  199 QRS Duration: 131 QT Interval:  414 QTC Calculation: 493 R Axis:   -69 Text Interpretation: Sinus rhythm RBBB and LAFB Confirmed by Tilden Fossaees, Elizabeth [phone]([id number]) on 10/30/2020 7:47:44 PM   Radiology CT PELVIS W CONTRAST  Result Date: 10/30/2020 CLINICAL DATA:  Rectal abscess. EXAM: CT PELVIS WITH CONTRAST TECHNIQUE: Multidetector CT imaging of the pelvis was performed using the standard protocol following the bolus administration of intravenous contrast. CONTRAST:  75mL OMNIPAQUE IOHEXOL 300 MG/ML  SOLN COMPARISON:  Dec 31, 2004. FINDINGS: Urinary Tract: Small diverticulum is seen arising from the right posterolateral portion of the urinary bladder. Bowel:  Unremarkable visualized pelvic bowel loops. Vascular/Lymphatic: Atherosclerosis of abdominal aorta and iliac arteries is noted. No adenopathy is noted. Reproductive:  Normal prostate. Other: Inflammatory changes are seen involving the subcutaneous tissues of the right buttocks medially consistent with cellulitis, but no fluid collection or abscess is noted. Musculoskeletal: No definite abnormality seen. IMPRESSION: Inflammatory changes are seen involving the subcutaneous tissues of the medial portions of the right buttocks, but no abscess or fluid collection is noted. There is no definite evidence of perianal or perirectal abscess. Aortic Atherosclerosis (ICD10-I70.0).  Electronically Signed   By: Lupita RaiderJames  Green Jr M.D.   On: 10/30/2020 15:55    Procedures Procedures   Medications Ordered in ED Medications  vancomycin (VANCOREADY) IVPB 1250 mg/250 mL (1,250 mg Intravenous New Bag/Given 10/30/20 2100)  iohexol (OMNIPAQUE) 300 MG/ML solution 75 mL (75 mLs Intravenous Contrast Given 10/30/20  1531)  lidocaine-EPINEPHrine-tetracaine (LET) topical gel (3 mLs Topical Given 10/30/20 1640)  morphine 4 MG/ML injection 4 mg (4 mg Intravenous Given 10/30/20 1837)  sodium chloride 0.9 % bolus 500 mL (0 mLs Intravenous Stopped 10/30/20 2015)    ED Course  I have reviewed the triage vital signs and the nursing notes.  Pertinent labs & imaging results that were available during my care of the patient were reviewed by me and considered in my medical decision making (see chart for details).    MDM Rules/Calculators/A&P                         Initial impression-patient presents with an abscess on his buttocks.  He is alert, does not appear acute distress, vital signs reassuring.  Concern for possible perirectal abscess versus fistula as he is extremely tender around his rectum.  Will obtain basic lab work, obtain CT pelvis for further evaluation.  Work-up-CBC unremarkable, CMP shows hyperglycemia 173, creatinine 1.33.  CT abdomen pelvis shows inflammatory changes seen involving the subcutaneous tissue of the right buttocks consistent with cellulitis, there is no noted fluid collection or abscess present.  Reassessment patient reassessed after providing him with morphine, continues to be unable to get up out of bed due to pain.  Recommend that patient  be admitted for IV antibiotics and further treatment.  Patient agreeable to this.  Consult -consult with hospitalist for admission due to cellulitis of the right buttocks.  Spoke with Dr. Clayborne Artist of the hospitalist team, she is except the patient will come down evaluate.  Rule out-low suspicion for systemic infection as patient  is nontoxic-appearing, vital signs reassuring.  low suspicion for perianal fistula and/or abscess CT pelvis did not reveal similar findings.  Low suspicion for drainable abscess as area on buttocks was evaluated there is no fluctuance present,  this correlates with CT pelvis findings.    Plan-admit patient to medicine due to cellulitis of the buttocks.  Anticipate patient will need further IV antibiotics.  Patient care be transferred to admitting team.    Final Clinical Impression(s) / ED Diagnoses Final diagnoses:  Cellulitis of buttock    Rx / DC Orders ED Discharge Orders    None       Barnie Del 10/30/20 2114    Maia Plan, MD 11/01/20 [phone]

## 2020-10-30 NOTE — Progress Notes (Signed)
Pharmacy Antibiotic Note  Bill Jackson is a 86 y.o. male admitted on 10/30/2020 with cellulitis concerning for abscess.  Pharmacy has been consulted for vancomyin dosing. Scr 1.33 which appears close to baseline.  Plan: Vancomycin 1250 mg IV every 24 hours (eAUC 478, goal AUC 400-550) Monitor renal function, C&S, clinical status   Height: 6' (182.9 cm) Weight: 86.2 kg (190 lb) IBW/kg (Calculated) : 77.6  Temp (24hrs), Avg:98.4 F (36.9 C), Min:98.4 F (36.9 C), Max:98.4 F (36.9 C)  Recent Labs  Lab 10/30/20 1300 10/30/20 1751  WBC 7.4  --   CREATININE 1.33*  --   LATICACIDVEN  --  1.4    Estimated Creatinine Clearance: 45.4 mL/min (A) (by C-G formula based on SCr of 1.33 mg/dL (H)).    Allergies  Allergen Reactions  . Lotensin [Benazepril Hcl] Cough  . Penicillins Other (See Comments)    pt becomes "nervous" Has patient had a PCN reaction causing immediate rash, facial/tongue/throat swelling, SOB or lightheadedness with hypotension:  Has patient had a PCN reaction causing severe rash involving mucus membranes or skin necrosis:  Has patient had a PCN reaction that required hospitalization  Has patient had a PCN reaction occurring within the last 10 years:  If all of the above answers are "NO", then may proceed with Cephalosporin use.     Antimicrobials this admission: Vancomycin 3/30>>  Dose adjustments this admission: N/A  Microbiology results: 3/30 Bcx:  Thank you for allowing pharmacy to be a part of this patient's care.  Kinnie Feil, PharmD PGY1 Acute Care Pharmacy Resident 10/30/2020 8:30 PM  Please check [email or web address] for unit specific pharmacy phone numbers.

## 2020-10-31 ENCOUNTER — Other Ambulatory Visit: Payer: Self-pay | Admitting: Family Medicine

## 2020-10-31 DIAGNOSIS — Z794 Long term (current) use of insulin: Secondary | ICD-10-CM | POA: Diagnosis not present

## 2020-10-31 DIAGNOSIS — E1122 Type 2 diabetes mellitus with diabetic chronic kidney disease: Secondary | ICD-10-CM | POA: Diagnosis not present

## 2020-10-31 DIAGNOSIS — N1831 Chronic kidney disease, stage 3a: Secondary | ICD-10-CM | POA: Diagnosis not present

## 2020-10-31 DIAGNOSIS — L03317 Cellulitis of buttock: Secondary | ICD-10-CM | POA: Diagnosis not present

## 2020-10-31 LAB — BASIC METABOLIC PANEL
Anion gap: 9 (ref 5–15)
BUN: 9 mg/dL (ref 8–23)
CO2: 25 mmol/L (ref 22–32)
Calcium: 8.7 mg/dL — ABNORMAL LOW (ref 8.9–10.3)
Chloride: 104 mmol/L (ref 98–111)
Creatinine, Ser: 1.22 mg/dL (ref 0.61–1.24)
GFR, Estimated: 58 mL/min — ABNORMAL LOW (ref >60)
Glucose, Bld: 114 mg/dL — ABNORMAL HIGH (ref 70–99)
Potassium: 3.9 mmol/L (ref 3.5–5.1)
Sodium: 138 mmol/L (ref 135–145)

## 2020-10-31 LAB — GLUCOSE, CAPILLARY
Glucose-Capillary: 192 mg/dL — ABNORMAL HIGH (ref 70–99)
Glucose-Capillary: 222 mg/dL — ABNORMAL HIGH (ref 70–99)

## 2020-10-31 LAB — CBG MONITORING, ED: Glucose-Capillary: 118 mg/dL — ABNORMAL HIGH (ref 70–99)

## 2020-10-31 LAB — CBC
HCT: 37.5 % — ABNORMAL LOW (ref 39.0–52.0)
Hemoglobin: 12.7 g/dL — ABNORMAL LOW (ref 13.0–17.0)
MCH: 33.1 pg (ref 26.0–34.0)
MCHC: 33.9 g/dL (ref 30.0–36.0)
MCV: 97.7 fL (ref 80.0–100.0)
Platelets: 250 10*3/uL (ref 150–400)
RBC: 3.84 MIL/uL — ABNORMAL LOW (ref 4.22–5.81)
RDW: 12.3 % (ref 11.5–15.5)
WBC: 7.8 10*3/uL (ref 4.0–10.5)
nRBC: 0 % (ref 0.0–0.2)

## 2020-10-31 MED ORDER — ADULT MULTIVITAMIN LIQUID CH: 15.0000 mL | Freq: Every day | ORAL | Status: DC

## 2020-10-31 MED ORDER — PROSOURCE TF PO LIQD
45.0000 mL | Freq: Three times a day (TID) | ORAL | Status: DC
  Filled 2020-10-31 (×2): qty 45

## 2020-10-31 MED ORDER — ACETAMINOPHEN 325 MG PO TABS: 650.0000 mg | ORAL_TABLET | Freq: Four times a day (QID) | ORAL | Status: DC | PRN

## 2020-10-31 MED ORDER — JEVITY 1.2 CAL PO LIQD
237.0000 mL | Freq: Three times a day (TID) | ORAL | Status: DC
  Filled 2020-10-31: qty 237

## 2020-10-31 MED ORDER — ADULT MULTIVITAMIN W/MINERALS CH
1.0000 | ORAL_TABLET | Freq: Every day | ORAL | Status: DC
  Administered 2020-10-31 – 2020-11-01 (×2): 1 via ORAL
  Filled 2020-10-31 (×2): qty 1

## 2020-10-31 MED ORDER — PROSOURCE PLUS PO LIQD
30.0000 mL | Freq: Three times a day (TID) | ORAL | Status: DC
  Administered 2020-10-31 – 2020-11-01 (×2): 30 mL via ORAL
  Filled 2020-10-31: qty 30

## 2020-10-31 MED ORDER — FREE WATER: 200.0000 mL | Freq: Three times a day (TID) | Status: DC

## 2020-10-31 MED ORDER — JEVITY 1.5 CAL/FIBER PO LIQD
237.0000 mL | Freq: Three times a day (TID) | ORAL | Status: DC
  Administered 2020-10-31: 237 mL
  Filled 2020-10-31 (×2): qty 237

## 2020-10-31 MED ORDER — JEVITY 1.5 CAL/FIBER PO LIQD
237.0000 mL | Freq: Three times a day (TID) | ORAL | Status: DC
  Administered 2020-10-31 – 2020-11-01 (×2): 237 mL via ORAL
  Filled 2020-10-31 (×5): qty 1000

## 2020-10-31 MED ORDER — JEVITY 1.5 CAL/FIBER PO LIQD
474.0000 mL | Freq: Three times a day (TID) | ORAL | Status: DC
  Filled 2020-10-31: qty 474

## 2020-10-31 MED ORDER — ADULT MULTIVITAMIN W/MINERALS CH: 1.0000 | ORAL_TABLET | Freq: Every day | ORAL | Status: DC

## 2020-10-31 NOTE — Plan of Care (Signed)

## 2020-10-31 NOTE — Progress Notes (Signed)
Family Medicine Teaching Service Daily Progress Note Intern Pager: [phone]  Patient name: Bill Jackson Medical record number: [id number] Date of birth: [birth year] Age: 86 y.o. Gender: male  Primary Care Provider: Margit Hanks, MD Consultants: None  Code Status: DNR  Pt Overview and Major Events to Date:  3/30 Admitted   Assessment and Plan: Bill Jackson is a 86 y.o. male presenting with painful boil of right buttock and concern for cellulitis. 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  No signs of perirectal abscess or fistula or external connection with rectum, consistent with CT abdomen pelvis showing soft tissue inflammation but no fluid collection or abscess present.  - Continue IV vancomycin (3/30-),will consider de-escalating tomorrow to Doxy  - Tylenol PRN, Tramadol 25mg  if Tylenol not effective - Up with assistance - PT/OT eval and treat - Warm compresses PRN - F/u Blood cultures  HTN BP has ranged from 90-131/44-85. Home medications include losartan 50mg  and HCTZ 25mg  daily, last taken 3/29.  - Hold home Losartan and HCTZ while normotensive, consider restart 3/31  - Continue to monitor BP   T2DM Glucose this am 114. Takes novolog 2-10U per SSI and Lantus 10-20U pending glucose levels at home (10U if <175, 15U 175-199, 20U if >200). Patient has tube feeds with Jevity. - CBGs qAC and QHS - sSSI  CKD stage IIIa: Chronic, stable Creatinine on admission 1.33>1.22 with GFR 53>58. Baseline appears to be 1.3, GFR in 2019 appeared to be around 50. Patient currently on vancomycin and has not had much oral intake in the last 36 hours. - Continue to monitor Creatinine and GFR  Previous CVA  Dysphagia 2/2 CVA, s/p PEG tube: Chronic, stable Patient currently uses for feeding with Jevity 1.5 TID, patient reports that  he sometimes will take the feeds orally if he is able to drink without choking, also takes his medications orally. Uses a walker to ambulate at baseline, following with PT.  - Consider adding low dose statin for secondary prevention (appears to have previous intolerance)  - Consult to dietician for tube feeds and recommendations - PT/OT - Continue Jevity 3 times daily per PEG   Right foot fractures: Stable Patient has boot on right foot. States that he fell and and 2 fractures in his foot at the end of January 2022 and has been in the boot since that time. No records in Epic regarding the fractures. Patient reports he is to possibly get the boot off on 4/4, but is unsure.  - Continue to monitor pain - Clarify follow-up plan with patient (I.e. provider and dates)  Glaucoma Patient reports history of glaucoma, currently on nightly eyedrops. Home medications include COSOPT 1 drop BID, Alphagan 0.2% 1 drop q8h, latanoprost 0.005% 1 drop QHS .  - Continue home Cosopt, Alphagan, and latanoprost   Seasonal Allergies Patient home medications include loratadine 10mg  daily. - Continue home loratadine  Tobacco use  Patient currently smokes 1/2 PPD. Patient interested in nicotine patch. - NicoDerm 7mg  patch   FEN/GI: Jevity 1.5cal/fiber TID per peg tube Prophylaxis: Lovenox  Disposition: Med-surg, observation, anticipate discharge home  Subjective:  No acute events overnight. Patient endorses pain from his right buttock. Denies any other complaints   Objective: Temp:  [98.4 F (36.9 C)] 98.4 F (36.9 C) (03/30 1212) Pulse Rate:  [42-90] 69 (03/31 0315) Resp:  [11-23] 18 (  03/31 0315) BP: (90-131)/(44-85) 90/53 (03/31 0315) SpO2:  [95 %-100 %] 97 % (03/31 0315) Weight:  [86.2 kg] 86.2 kg (03/30 1212) Physical Exam: General: alert, pleasant, NAD Cardiovascular: RRR no murmurs Respiratory: CTAB normal WOB Abdomen: soft, non distended, non tender  Extremities: warm, dry. No LE  edema Derm: Right buttock with area of firm induration medially that dose not extend to rectum. Exquisitely tender to palpation. No purulence appreciated   Laboratory: Recent Labs  Lab 10/30/20 1300 10/31/20 0226  WBC 7.4 7.8  HGB 13.5 12.7*  HCT 40.4 37.5*  PLT 244 250   Recent Labs  Lab 10/30/20 1300 10/31/20 0226  NA 136 138  K 4.1 3.9  CL 101 104  CO2 28 25  BUN 12 9  CREATININE 1.33* 1.22  CALCIUM 8.8* 8.7*  PROT 6.6  --   BILITOT 1.0  --   ALKPHOS 57  --   ALT 9  --   AST 15  --   GLUCOSE 173* 114*    Imaging/Diagnostic Tests: CT PELVIS W CONTRAST  Result Date: 10/30/2020 CLINICAL DATA:  Rectal abscess. EXAM: CT PELVIS WITH CONTRAST TECHNIQUE: Multidetector CT imaging of the pelvis was performed using the standard protocol following the bolus administration of intravenous contrast. CONTRAST:  18mL OMNIPAQUE IOHEXOL 300 MG/ML  SOLN COMPARISON:  Dec 31, 2004. FINDINGS: Urinary Tract: Small diverticulum is seen arising from the right posterolateral portion of the urinary bladder. Bowel:  Unremarkable visualized pelvic bowel loops. Vascular/Lymphatic: Atherosclerosis of abdominal aorta and iliac arteries is noted. No adenopathy is noted. Reproductive:  Normal prostate. Other: Inflammatory changes are seen involving the subcutaneous tissues of the right buttocks medially consistent with cellulitis, but no fluid collection or abscess is noted. Musculoskeletal: No definite abnormality seen. IMPRESSION: Inflammatory changes are seen involving the subcutaneous tissues of the medial portions of the right buttocks, but no abscess or fluid collection is noted. There is no definite evidence of perianal or perirectal abscess. Aortic Atherosclerosis (ICD10-I70.0). Electronically Signed   By: Lupita Raider M.D.   On: 10/30/2020 15:55    Cora Collum, DO 10/31/2020, 5:42 AM PGY-1, [facility] FPTS Intern pager: [phone], text pages welcome

## 2020-10-31 NOTE — Evaluation (Signed)
Physical Therapy Evaluation Patient Details Name: Bill Jackson MRN: [id number] DOB: January 15, 1935 Today's Date: 10/31/2020   History of Present Illness  Pt is an 86 y/o male admitted 3/30 secondary to progression of wound on buttock. PMH includes CVA with R sided weakness, DM, HTN, and R foot fx.  Clinical Impression  Pt admitted secondary to problem above with deficits below. Pt with increased buttock pain during session. Overall requiring min A for bed mobility and min guard A for transfers and gait within the ED room. Pt reports he has been working with therapy on T/Th every week and has an aide MTF to assist with ADL and IADL tasks. Feel pt will progress well; recommending continuation of HH services. Will continue to follow acutely.     Follow Up Recommendations Home health PT    Equipment Recommendations  None recommended by PT    Recommendations for Other Services       Precautions / Restrictions Precautions Precautions: Fall Required Braces or Orthoses: Other Brace Other Brace: CAM walker boot on RLE secondary to old foot fx Restrictions Weight Bearing Restrictions: No Other Position/Activity Restrictions: WBAT per pt      Mobility  Bed Mobility Overal bed mobility: Needs Assistance Bed Mobility: Sit to Supine;Supine to Sit     Supine to sit: Min assist Sit to supine: Min assist   General bed mobility comments: Min A for trunk elevation to come to sitting. Min A for RLE assist for return to supine. Increased pain in buttock when sitting.    Transfers Overall transfer level: Needs assistance Equipment used: Rolling walker (2 wheeled) Transfers: Sit to/from Stand Sit to Stand: Min guard         General transfer comment: Min guard for safety. No overt LOB noted.  Ambulation/Gait Ambulation/Gait assistance: Min guard Gait Distance (Feet): 10 Feet Assistive device: Rolling walker (2 wheeled) Gait Pattern/deviations: Step-to pattern;Decreased weight shift to  right Gait velocity: Decreased   General Gait Details: Pt with step to pattern secondary to R CAM walker boot weight. No overt LOB noted with ambulation in ED room.  Stairs            Wheelchair Mobility    Modified Rankin (Stroke Patients Only)       Balance Overall balance assessment: Needs assistance Sitting-balance support: No upper extremity supported;Feet supported Sitting balance-Leahy Scale: Good     Standing balance support: Bilateral upper extremity supported Standing balance-Leahy Scale: Poor Standing balance comment: Reliant on BUE support                             Pertinent Vitals/Pain Pain Assessment: Faces Faces Pain Scale: Hurts even more Pain Location: bottom Pain Descriptors / Indicators: Guarding;Grimacing Pain Intervention(s): Limited activity within patient's tolerance;Monitored during session;Repositioned    Home Living Family/patient expects to be discharged to:: Private residence Living Arrangements: Alone Available Help at Discharge: Family;Personal care attendant;Available PRN/intermittently Type of Home: House Home Access: Ramped entrance     Home Layout: One level Home Equipment: Grab bars - tub/shower;Walker - 4 wheels;Shower seat - built in Additional Comments: Reports PT comes on T/TH and Aide come MWF for 2 hours    Prior Function Level of Independence: Needs assistance   Gait / Transfers Assistance Needed: Uses rollator for ambulation  ADL's / Homemaking Assistance Needed: Aide assists with bathing and cleaning tasks        Hand Dominance  Extremity/Trunk Assessment   Upper Extremity Assessment Upper Extremity Assessment: Defer to OT evaluation    Lower Extremity Assessment Lower Extremity Assessment: RLE deficits/detail RLE Deficits / Details: Old R foot fx. reports he is supposed to get his CAM boot off soon. RLE weakness at basleine secondary to CVA.    Cervical / Trunk  Assessment Cervical / Trunk Assessment: Kyphotic  Communication   Communication: Other (comment) (dysarthria at baseline)  Cognition Arousal/Alertness: Awake/alert Behavior During Therapy: WFL for tasks assessed/performed Overall Cognitive Status: No family/caregiver present to determine baseline cognitive functioning                                 General Comments: CVA at baseline with dysarthria. Overall appropriate throughout session; likely close to baseline      General Comments      Exercises     Assessment/Plan    PT Assessment Patient needs continued PT services  PT Problem List Decreased strength;Decreased balance;Decreased activity tolerance;Decreased mobility;Decreased knowledge of use of DME;Decreased knowledge of precautions;Pain       PT Treatment Interventions DME instruction;Gait training;Functional mobility training;Therapeutic exercise;Therapeutic activities;Balance training;Patient/family education    PT Goals (Current goals can be found in the Care Plan section)  Acute Rehab PT Goals Patient Stated Goal: to go home PT Goal Formulation: With patient Time For Goal Achievement: 11/14/20 Potential to Achieve Goals: Good    Frequency Min 3X/week   Barriers to discharge        Co-evaluation               AM-PAC PT "6 Clicks" Mobility  Outcome Measure Help needed turning from your back to your side while in a flat bed without using bedrails?: A Little Help needed moving from lying on your back to sitting on the side of a flat bed without using bedrails?: A Little Help needed moving to and from a bed to a chair (including a wheelchair)?: A Little Help needed standing up from a chair using your arms (e.g., wheelchair or bedside chair)?: A Little Help needed to walk in hospital room?: A Little Help needed climbing 3-5 steps with a railing? : A Lot 6 Click Score: 17    End of Session Equipment Utilized During Treatment: Gait  belt Activity Tolerance: Patient tolerated treatment well Patient left: in bed;with call bell/phone within reach (on stretcher in ED) Nurse Communication: Mobility status PT Visit Diagnosis: Other abnormalities of gait and mobility (R26.89);Pain Pain - part of body:  (buttock)    Time: 5400-8676 PT Time Calculation (min) (ACUTE ONLY): 16 min   Charges:   PT Evaluation $PT Eval Low Complexity: 1 Low          Cindee Salt, DPT  Acute Rehabilitation Services  Pager: [phone] Office: [phone]   Lehman Prom 10/31/2020, 11:03 AM

## 2020-10-31 NOTE — ED Notes (Signed)
Patient ambulated to the bathroom with walker and no assistance needed. Shuffling cautious gait, but noted to be steady w CAM boot on the right foot. Non skid sock placed to left. Patient back in bed after getting cleaned up. See note in ADL.

## 2020-10-31 NOTE — Progress Notes (Addendum)
Initial Nutrition Assessment  DOCUMENTATION CODES:   Not applicable  INTERVENTION:    Jevity 1.5 237 ml (1 carton) PO TID.  Prosource Plus 45 ml TID, each supplement provides 100 kcal and 15 gm protein.  MVI with minerals once daily.  NUTRITION DIAGNOSIS:   Increased nutrient needs related to wound healing as evidenced by estimated needs.  GOAL:   Patient will meet greater than or equal to 90% of their needs  MONITOR:   TF tolerance,Skin,Labs  REASON FOR ASSESSMENT:   Consult Enteral/tube feeding initiation and management  ASSESSMENT:   86 yo male admitted with acute cellulitis of right buttock. PMH includes HTN, DM-2, HLD, dysphagia after CVA, PEG.   Patient is currently NPO. Per discussion with RN, patient is drinking the Jevity 1.5, he does not put it through the PEG.  Patient is currently sleeping, unable to get further information from him at this time.   Labs reviewed.  CBG: 118  Medications reviewed and include novolog, IV vancomycin.   No recent weights available for review.   Diet Order:   Diet Order    None      EDUCATION NEEDS:   Not appropriate for education at this time  Skin:  Skin Assessment: Skin Integrity Issues: Skin Integrity Issues:: Other (Comment) Other: cellulitis R buttocks  Last BM:  3/31  Height:   Ht Readings from Last 1 Encounters:  10/30/20 6' (1.829 m)    Weight:   Wt Readings from Last 1 Encounters:  10/30/20 86.2 kg    Ideal Body Weight:  80.9 kg  BMI:  Body mass index is 25.77 kg/m.  Estimated Nutritional Needs:   Kcal:  2200-2400  Protein:  110-130 gm  Fluid:  >/= 2 L    Gabriel Rainwater, RD, LDN, CNSC Please refer to Amion for contact information.

## 2020-11-01 DIAGNOSIS — R531 Weakness: Secondary | ICD-10-CM | POA: Diagnosis not present

## 2020-11-01 DIAGNOSIS — L03317 Cellulitis of buttock: Secondary | ICD-10-CM | POA: Diagnosis not present

## 2020-11-01 DIAGNOSIS — E1122 Type 2 diabetes mellitus with diabetic chronic kidney disease: Secondary | ICD-10-CM | POA: Diagnosis not present

## 2020-11-01 DIAGNOSIS — N1831 Chronic kidney disease, stage 3a: Secondary | ICD-10-CM | POA: Diagnosis not present

## 2020-11-01 LAB — GLUCOSE, CAPILLARY
Glucose-Capillary: 207 mg/dL — ABNORMAL HIGH (ref 70–99)
Glucose-Capillary: 255 mg/dL — ABNORMAL HIGH (ref 70–99)

## 2020-11-01 MED ORDER — DOXYCYCLINE HYCLATE 100 MG PO TABS
100.0000 mg | ORAL_TABLET | Freq: Two times a day (BID) | ORAL | Status: DC
Start: 2020-11-01 — End: 2020-11-01

## 2020-11-01 MED ORDER — DOXYCYCLINE HYCLATE 100 MG PO TABS: 100.0000 mg | ORAL_TABLET | Freq: Two times a day (BID) | ORAL | 0 refills | Status: DC

## 2020-11-01 NOTE — Hospital Course (Addendum)
Cellulitis  Hypertension Type 2 DM CKD stage 3a Prior CVA with residual weakness Right foot fracture Glaucoma Seasonal allergies Tobacco use    Bill Jackson is a 86 y.o. male presenting with painful boil of right buttock and concern for cellulitis. 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  Acute Cellulitis or Buttocks Patient presented with 1 week history of boil on right buttocks. He was seen in urgent care and given 2 doses of doxycycline and decided to come to the ED after experiencing increasing pain. On admission, lactic acid 1.4 and absence of leukocytosis. CT abdomen pelvis notable for soft tissue inflammation without fluid collection or presence of abscess. Patient admitted for antibiotics and pain control. Given vancomycin for 3 days and then transitioned to doxycycline. Patient noted to have significant improvement. Blood cultures notable for no growth for 2 days. Discharged with doxycycline and instructed to complete remainder of antibiotic course. No further complications noted and no additional interventions performed.   Issues for follow up: Encourage smoking cessation. Ensure follow up with podiatry for boot placed s/p right foot fracture. Given patient's history of previous CVA, consider initiation of statin therapy. HCTZ discontinued, patient on restarted on home losartan given normotensive blood pressures during admission. Follow up with PCP for BP check to reassess and makes changes as appropriate.  Ensure patient is receiving PT and OT home health as these were ordered upon discharge.

## 2020-11-01 NOTE — Evaluation (Signed)
Occupational Therapy Evaluation Patient Details Name: Bill Jackson MRN: [id number] DOB: March 11, 1935 Today's Date: 11/01/2020    History of Present Illness Pt is an 86 y/o male admitted 3/30 secondary to progression of wound on buttock. PMH includes CVA with R sided weakness, DM, HTN, and R foot fx.   Clinical Impression   PTA, pt lives alone, uses Rollator for mobility and reports light assist from [facility] aide MWF for sponge bathing and IADL tasks. Pt presents with minor deficits in standing balance, endurance, shoulder ROM, strength and buttocks pain from boil. Pt overall Min A for bed mobility, Supervision for short mobility in room using RW. Pt able to stand for ADLs at sink > 5 min though notably fatigued after this task. Pt requires Min A for UB and LB ADLs. Pt reports difficulty in ability to manage cam boot and just leaves it on at home. Pt would benefit from [facility] follow-up at DC to maximize independence with ADLs, assess home setup and increase confidence for showering tasks as pt is fearful of falling.     Follow Up Recommendations  Home health OT;Supervision - Intermittent    Equipment Recommendations  None recommended by OT    Recommendations for Other Services       Precautions / Restrictions Precautions Precautions: Fall;Other (comment) Precaution Comments: feeding tube Required Braces or Orthoses: Other Brace Other Brace: CAM walker boot on RLE secondary to old foot fx Restrictions Weight Bearing Restrictions: Yes Other Position/Activity Restrictions: WBAT per pt      Mobility Bed Mobility Overal bed mobility: Needs Assistance Bed Mobility: Sit to Supine;Supine to Sit     Supine to sit: Min guard Sit to supine: Min assist   General bed mobility comments: min guard and increased time to sit EOB, Min A to get R LE back into bed. Pt reports he "throws" his LE up on bed at home    Transfers Overall transfer level: Needs assistance Equipment used: Rolling walker (2  wheeled) Transfers: Sit to/from Stand Sit to Stand: Supervision         General transfer comment: supervision for safety in standing, no overt LOB.    Balance Overall balance assessment: Needs assistance Sitting-balance support: No upper extremity supported;Feet supported Sitting balance-Leahy Scale: Good     Standing balance support: Bilateral upper extremity supported Standing balance-Leahy Scale: Fair Standing balance comment: fair static standing at sink, reliant on B UE support for mobility                           ADL either performed or assessed with clinical judgement   ADL Overall ADL's : Needs assistance/impaired Eating/Feeding: NPO   Grooming: Supervision/safety;Standing;Oral care Grooming Details (indicate cue type and reason): Supervision to ensure safety, no LOB Upper Body Bathing: Minimal assistance;Sitting Upper Body Bathing Details (indicate cue type and reason): reports difficulty reaching back Lower Body Bathing: Minimal assistance;Sit to/from stand   Upper Body Dressing : Set up;Sitting   Lower Body Dressing: Minimal assistance;Sit to/from stand;Sitting/lateral leans Lower Body Dressing Details (indicate cue type and reason): Pt reports difficulty managing cam boot. typically sleeps in it, leaves it on, etc. Pt reports may have cam boot removed with MD follow-up 4/4 Toilet Transfer: Supervision/safety;Ambulation;RW   Toileting- Clothing Manipulation and Hygiene: Supervision/safety;Sitting/lateral lean;Sit to/from stand       Functional mobility during ADLs: Supervision/safety;Rolling walker General ADL Comments: Minor limitations in ability to reach and manage LB ADLs, decreased B shoulder ROM  so difficulty bathing back (aide assists. Educated on possible modification to make pt feel more safe with showering task attempts (also would increased ability to reach back for bathing with handheld shower head)     Vision Baseline Vision/History:  Wears glasses Wears Glasses: At all times Patient Visual Report: No change from baseline Vision Assessment?: No apparent visual deficits     Perception     Praxis      Pertinent Vitals/Pain Pain Assessment: Faces Faces Pain Scale: Hurts a little bit Pain Location: bottom Pain Descriptors / Indicators: Guarding;Grimacing Pain Intervention(s): Monitored during session;Repositioned     Hand Dominance Right   Extremity/Trunk Assessment Upper Extremity Assessment Upper Extremity Assessment: Overall WFL for tasks assessed (difficulty reaching behind back fully with B UE)   Lower Extremity Assessment Lower Extremity Assessment: Defer to PT evaluation   Cervical / Trunk Assessment Cervical / Trunk Assessment: Kyphotic   Communication Communication Communication: Other (comment) (dysarthria at baseline)   Cognition Arousal/Alertness: Awake/alert Behavior During Therapy: WFL for tasks assessed/performed Overall Cognitive Status: No family/caregiver present to determine baseline cognitive functioning                                 General Comments: CVA at baseline with dysarthria. Overall appropriate throughout session; likely close to baseline   General Comments  VSS. Pt asking for breakfast though with feeding tube and noted with NPO status - NT aware of pt questions for meals    Exercises     Shoulder Instructions      Home Living Family/patient expects to be discharged to:: Private residence Living Arrangements: Alone Available Help at Discharge: Family;Personal care attendant;Available PRN/intermittently Type of Home: House Home Access: Ramped entrance     Home Layout: One level     Bathroom Shower/Tub: Producer, television/film/video: Handicapped height     Home Equipment: Grab bars - tub/shower;Walker - 4 wheels;Shower seat - built in;Hand held shower head   Additional Comments: Reports PT comes on T/TH and Aide come MWF for 2 hours       Prior Functioning/Environment Level of Independence: Needs assistance  Gait / Transfers Assistance Needed: Uses rollator for ambulation ADL's / Homemaking Assistance Needed: Aide assists with sponge bathing as needed and cleaning tasks. Pt reports able to complete some laundry, fearful of getting into shower due to fear of falling. Pt reports having a feeding tube and has "protein drinks" he has 3x/day for meals.            OT Problem List: Decreased strength;Decreased activity tolerance;Decreased range of motion;Impaired balance (sitting and/or standing);Pain      OT Treatment/Interventions: Self-care/ADL training;Therapeutic exercise;DME and/or AE instruction;Therapeutic activities;Patient/family education;Balance training    OT Goals(Current goals can be found in the care plan section) Acute Rehab OT Goals Patient Stated Goal: have breakfast, get a bath OT Goal Formulation: With patient Time For Goal Achievement: 11/15/20 Potential to Achieve Goals: Good ADL Goals Pt Will Perform Grooming: with modified independence;standing Pt Will Perform Upper Body Bathing: with modified independence;with adaptive equipment;sitting Pt Will Perform Lower Body Dressing: with modified independence;with adaptive equipment;sitting/lateral leans;sit to/from stand Pt Will Transfer to Toilet: with modified independence;ambulating Pt/caregiver will Perform Home Exercise Program: Increased strength;Increased ROM;Both right and left upper extremity;With theraband;Independently;With written HEP provided  OT Frequency: Min 2X/week   Barriers to D/C:            Co-evaluation  AM-PAC OT "6 Clicks" Daily Activity     Outcome Measure Help from another person eating meals?: Total (NPO) Help from another person taking care of personal grooming?: A Little Help from another person toileting, which includes using toliet, bedpan, or urinal?: A Little Help from another person bathing  (including washing, rinsing, drying)?: A Little Help from another person to put on and taking off regular upper body clothing?: A Little Help from another person to put on and taking off regular lower body clothing?: A Little 6 Click Score: 16   End of Session Equipment Utilized During Treatment: Gait belt;Rolling walker Nurse Communication: Mobility status;Other (comment) (request for breakfast)  Activity Tolerance: Patient tolerated treatment well Patient left: in bed;with call bell/phone within reach;with bed alarm set  OT Visit Diagnosis: Unsteadiness on feet (R26.81);Other abnormalities of gait and mobility (R26.89);Muscle weakness (generalized) (M62.81);Pain Pain - part of body:  (buttocks)                Time: 0712-0739 OT Time Calculation (min): 27 min Charges:  OT General Charges $OT Visit: 1 Visit OT Evaluation $OT Eval Low Complexity: 1 Low OT Treatments $Self Care/Home Management : 8-22 mins  Bradd Canary, OTR/L Acute Rehab Services Office: [phone]  Lorre Munroe 11/01/2020, 7:53 AM

## 2020-11-01 NOTE — TOC Transition Note (Signed)
Transition of Care [facility]) - CM/SW Discharge Note   [name] MRN: [id number] Date of Birth: [birth year]  Transition of Care [facility]) CM/SW Contact:  Leone Haven, RN Phone Number: 11/01/2020, 12:53 PM   Clinical Narrative:    Patient is for dc today, he is active with [facility] for [facility] services.  Patient is observation so will not need resumption orders.  Patient has a walker at home.  He also has an aide with comfort care on MWF from 9 am to 11.  He has transportation home today.  NCM informed Marylene Land with [facility] that patient is for dc today.   Final next level of care: Home w Home Health Services Barriers to Discharge: No Barriers Identified   Patient Goals and CMS Choice Patient states their goals for this hospitalization and ongoing recovery are:: return home      Discharge Placement                       Discharge Plan and Services                  DME Agency: NA       HH Arranged: RN,PT HH Agency: [facility] Date [facility] Agency Contacted: 11/01/20 Time HH Agency Contacted: [phone] Representative spoke with at [facility] Agency: Marylene Land  Social Determinants of Health (SDOH) Interventions     Readmission Risk Interventions No flowsheet data found.

## 2020-11-01 NOTE — Progress Notes (Signed)
Physical Therapy Treatment Patient Details Name: Bill Jackson MRN: [id number] DOB: 08/28/1934 Today's Date: 11/01/2020    History of Present Illness Pt is an 86 y/o male admitted 3/30 secondary to progression of wound on buttock. PMH includes CVA with R sided weakness, DM, HTN, and R foot fx.    PT Comments    Pt progressing towards goals. Increased ambulation distance this session. Continues to present with slow, cautious gait, however, no overt LOB noted. Overall at a min guard to supervision level. Current recommendations appropriate. Will continue to follow acutely.     Follow Up Recommendations  Home health PT     Equipment Recommendations  None recommended by PT    Recommendations for Other Services       Precautions / Restrictions Precautions Precautions: Fall;Other (comment) Precaution Comments: feeding tube Required Braces or Orthoses: Other Brace Other Brace: CAM walker boot on RLE secondary to old foot fx Restrictions Weight Bearing Restrictions: No Other Position/Activity Restrictions: WBAT per pt    Mobility  Bed Mobility Overal bed mobility: Needs Assistance Bed Mobility: Sit to Supine;Supine to Sit     Supine to sit: Mod assist Sit to supine: Min assist   General bed mobility comments: mod A for trunk elevation and increased time to sit EOB;  Min A to get R LE back into bed. Pt    Transfers Overall transfer level: Needs assistance Equipment used: Rolling walker (2 wheeled) Transfers: Sit to/from Stand Sit to Stand: Supervision         General transfer comment: supervision for safety to stand. Demonstrated safe hand placement.  Ambulation/Gait Ambulation/Gait assistance: Supervision Gait Distance (Feet): 100 Feet Assistive device: Rolling walker (2 wheeled) Gait Pattern/deviations: Step-to pattern;Decreased weight shift to right Gait velocity: Decreased   General Gait Details: Pt with step to pattern secondary to R CAM walker boot weight.  No overt LOB noted. Supervision for safety.   Stairs             Wheelchair Mobility    Modified Rankin (Stroke Patients Only)       Balance Overall balance assessment: Needs assistance Sitting-balance support: No upper extremity supported;Feet supported Sitting balance-Leahy Scale: Good     Standing balance support: Bilateral upper extremity supported Standing balance-Leahy Scale: Fair Standing balance comment: fair static standing at sink, reliant on B UE support for mobility                            Cognition Arousal/Alertness: Awake/alert Behavior During Therapy: WFL for tasks assessed/performed Overall Cognitive Status: No family/caregiver present to determine baseline cognitive functioning                                 General Comments: CVA at baseline with dysarthria. Overall appropriate throughout session; likely close to baseline      Exercises      General Comments        Pertinent Vitals/Pain Pain Assessment: Faces Faces Pain Scale: Hurts a little bit Pain Location: bottom Pain Descriptors / Indicators: Guarding;Grimacing Pain Intervention(s): Limited activity within patient's tolerance;Monitored during session;Repositioned    Home Living Family/patient expects to be discharged to:: Private residence Living Arrangements: Alone                  Prior Function            PT Goals (current goals can  now be found in the care plan section) Acute Rehab PT Goals Patient Stated Goal: have breakfast, get a bath PT Goal Formulation: With patient Time For Goal Achievement: 11/14/20 Potential to Achieve Goals: Good Progress towards PT goals: Progressing toward goals    Frequency    Min 3X/week      PT Plan Current plan remains appropriate    Co-evaluation              AM-PAC PT "6 Clicks" Mobility   Outcome Measure  Help needed turning from your back to your side while in a flat bed without  using bedrails?: A Little Help needed moving from lying on your back to sitting on the side of a flat bed without using bedrails?: A Lot Help needed moving to and from a bed to a chair (including a wheelchair)?: A Little Help needed standing up from a chair using your arms (e.g., wheelchair or bedside chair)?: A Little Help needed to walk in hospital room?: A Little Help needed climbing 3-5 steps with a railing? : A Lot 6 Click Score: 16    End of Session Equipment Utilized During Treatment: Gait belt Activity Tolerance: Patient tolerated treatment well Patient left: in bed;with call bell/phone within reach;with nursing/sitter in room (NT in room) Nurse Communication: Mobility status PT Visit Diagnosis: Other abnormalities of gait and mobility (R26.89);Pain Pain - part of body:  (buttock)     Time: 1206-1229 PT Time Calculation (min) (ACUTE ONLY): 23 min  Charges:  $Gait Training: 23-37 mins                     Cindee Salt, DPT  Acute Rehabilitation Services  Pager: [phone] Office: [phone]    Lehman Prom 11/01/2020, 1:30 PM

## 2020-11-01 NOTE — Plan of Care (Signed)

## 2020-11-01 NOTE — Progress Notes (Signed)
Family Medicine Teaching Service Daily Progress Note Intern Pager: [phone]  Patient name: Bill Jackson Medical record number: [id number] Date of birth: 30-Aug-1934 Age: 86 y.o. Gender: male  Primary Care Provider: Margit Hanks, MD Consultants: None Code Status: DNR  Pt Overview and Major Events to Date:  3/30: Admitted   Assessment and Plan: Bill Jackson is a 86 y.o. male presenting with painful boil of right buttock and concern for cellulitis. 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  No signs of perirectal abscess or fistula or external connection with rectum, consistent with CT abdomen pelvis showing soft tissue inflammation but no fluid collection or abscess present. Blood cultures thus far notable for no growth for <12 hours. PT recommended Home health PT without further recommendations. OT recommends home health OT with intermittent supervision.  - IV vancomycin (3/30-) Consider deescalating to doxycycline for 5 remaining days.   - Tylenol PRN, Tramadol 25mg  if Tylenol not effective - Up with assistance - Warm compresses PRN - continue to follow blood cultures  HTN Chronic and stable. Currently at goal with BP normotensive at 122/64. Home medications include losartan 50mg  and HCTZ 25mg  daily, last taken 3/29.  - Hold home Losartan and HCTZ while normotensive, consider restarting an anti-hypertensive agent prior to discharge - monitor BP  T2DM CBG 207. Takes novolog 2-10U per SSI and Lantus 10-20U pending glucose levels at home (10U if <175, 15U 175-199, 20U if >200). Patient has tube feeds with Jevity. - CBGs qAC and QHS - sSSI  CKD stage IIIa: Chronic, stable Creatinine on admission 1.33>1.22 with GFR 53>58. Baseline appears to be 1.3, GFR in 2019 appeared to be around 50. Patient currently on vancomycin and  has not had much oral intake in the last 36 hours. -monitor BMP  Previous CVA  Dysphagia 2/2 CVA, s/p PEG tube: Chronic, stable Patient currently uses for feeding with Jevity 1.5 TID, patient reports that he sometimes will take the feeds orally if he is able to drink without choking, also takes his medications orally. Residual right-sided weakness from prior CVA. Uses a walker to ambulate at baseline, following with PT.  -consider statin initiation therapy  - Consult to dietician for tube feeds and recommendations - PT/OT - Continue Jevity 3 times daily per PEG   Right foot fractures: Stable Patient has boot on right foot. States that he fell and and 2 fractures in his foot at the end of January 2022 and has been in the boot since that time. No records in Epic regarding the fractures. Patient reports he is to possibly get the boot off on 4/4, but is unsure.  - Continue to monitor pain  Glaucoma Patient reports history of glaucoma, currently on nightly eyedrops. Home medications include COSOPT 1 drop BID, Alphagan 0.2% 1 drop q8h, latanoprost 0.005% 1 drop QHS .  - Continue home Cosopt, Alphagan, and latanoprost   Seasonal Allergies Patient home medications include loratadine 10mg  daily. - Continue home loratadine  Tobacco use  Patient currently smokes 1/2 PPD.  -nicotine patch 7 mg   FEN/GI: Jevity 1.5cal/fiber TID per peg tube PPx: Lovenox    Status is: Observation  The patient remains OBS appropriate and will d/c before 2 midnights.  Dispo: The patient is from: Home              Anticipated d/c is to: Home  Patient currently is not medically stable to d/c.   Difficult to place patient No        Subjective:  No acute overnight events. Patient states that he was recommended by outpatient podiatrist that he was seeing to keep wearing the boot for 5 weeks. He has an upcoming follow up appointment with them,   Objective: Temp:  [97.6 F (36.4 C)-100.2  F (37.9 C)] 97.6 F (36.4 C) (04/01 0750) Pulse Rate:  [47-69] 47 (04/01 0750) Resp:  [16-20] 20 (04/01 0750) BP: (102-139)/(46-102) 122/64 (04/01 0750) SpO2:  [92 %-100 %] 97 % (04/01 0750) Physical Exam: General: Patient laying comfortably in bed, in no acute distress. Cardiovascular: RRR, no murmurs or gallops auscultated  Respiratory: CTAB, decreased air movement throughout all lung fields Abdomen: soft, nontender, nondistended, BS+ GU: slightly hyperpigmented area located just lateral to gluteal fold, no rash, fistula or other lesion noted Extremities: right LE boot in place, no LE edema noted bilaterally, left distal pulses intact, radial pulses strong and equal bilaterally Neuro: CN 2-12 grossly intact, 5/5 strength UE bilaterally and left LE, gross sensation intact  Psych: mood appropriate   Laboratory: Recent Labs  Lab 10/30/20 1300 10/31/20 0226  WBC 7.4 7.8  HGB 13.5 12.7*  HCT 40.4 37.5*  PLT 244 250   Recent Labs  Lab 10/30/20 1300 10/31/20 0226  NA 136 138  K 4.1 3.9  CL 101 104  CO2 28 25  BUN 12 9  CREATININE 1.33* 1.22  CALCIUM 8.8* 8.7*  PROT 6.6  --   BILITOT 1.0  --   ALKPHOS 57  --   ALT 9  --   AST 15  --   GLUCOSE 173* 114*      Imaging/Diagnostic Tests: No results found.  Reece Leader, DO 11/01/2020, 8:08 AM PGY-1, [facility] Family Medicine FPTS Intern pager: [phone], text pages welcome

## 2020-11-01 NOTE — Discharge Summary (Addendum)
Family Medicine Teaching [facility] Discharge Summary  Patient name: Bill Jackson Medical record number: [id number] Date of birth: 03-08-1935 Age: 86 y.o. Gender: male Date of Admission: 10/30/2020  Date of Discharge: 11/01/2020 Admitting Physician: Billey Co, MD  Primary Care Provider: Margit Hanks, MD Consultants: None  Indication for Hospitalization: painful boil concerning for cellulitis   Discharge Diagnoses/Problem List:  Cellulitis  Hypertension Type 2 DM CKD stage 3a Prior CVA with residual weakness Right foot fracture Glaucoma Seasonal allergies Tobacco use   Disposition: home  Discharge Condition: medically stable  Discharge Exam:  Temp:  [97.6 F (36.4 C)-100.2 F (37.9 C)] 97.6 F (36.4 C) (04/01 0750) Pulse Rate:  [47-69] 47 (04/01 0750) Resp:  [16-20] 20 (04/01 0750) BP: (102-139)/(46-102) 122/64 (04/01 0750) SpO2:  [92 %-100 %] 97 % (04/01 0750)  Physical Exam: General: Patient laying comfortably in bed, in no acute distress. Cardiovascular: RRR, no murmurs or gallops auscultated  Respiratory: CTAB, decreased air movement throughout all lung fields Abdomen: soft, nontender, nondistended, BS+ GU: slightly hyperpigmented area located just lateral to gluteal fold, no rash, fistula or other lesion noted Extremities: right LE boot in place, no LE edema noted bilaterally, left distal pulses intact, radial pulses strong and equal bilaterally Neuro: CN 2-12 grossly intact, 5/5 strength UE bilaterally and left LE, gross sensation intact  Psych: mood appropriate   Brief Hospital Course:  Bill Jackson is a 86 y.o. male presenting with painful boil of right buttock and concern for cellulitis. 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  Acute Cellulitis or Buttocks Patient presented with 1 week history of boil on right buttocks. He was seen in  urgent care and given 2 doses of doxycycline and decided to come to the ED after experiencing increasing pain. On admission, lactic acid was 1.4 and he was noted to have an absence of leukocytosis. CT abdomen/pelvis was notable for soft tissue inflammation without fluid collection or presence of abscess. Patient admitted for antibiotics and pain control. He was given vancomycin for 3 days and then transitioned to doxycycline. Patient noted to have significant improvement. Blood cultures notable for no growth for 2 days. Discharged with doxycycline and instructed to complete remainder of antibiotic course. No further complications noted and no additional interventions performed.   All other issues were chronic and stable.   Issues for Follow Up:  Encourage smoking cessation. Ensure follow up with podiatry for boot placed s/p right foot fracture. Given patient's history of previous CVA, consider initiation of statin therapy. HCTZ discontinued, patient on restarted on home losartan given normotensive blood pressures during admission. Follow up with PCP for BP check to reassess and makes changes as appropriate.  Ensure patient is receiving PT and OT home health as these were ordered upon discharge.   Significant Procedures:  none  Significant Labs and Imaging:  Recent Labs  Lab 10/30/20 1300 10/31/20 0226  WBC 7.4 7.8  HGB 13.5 12.7*  HCT 40.4 37.5*  PLT 244 250   Recent Labs  Lab 10/30/20 1300 10/31/20 0226  NA 136 138  K 4.1 3.9  CL 101 104  CO2 28 25  GLUCOSE 173* 114*  BUN 12 9  CREATININE 1.33* 1.22  CALCIUM 8.8* 8.7*  ALKPHOS 57  --   AST 15  --   ALT 9  --   ALBUMIN 3.2*  --       Results/Tests  Pending at Time of Discharge: none  Discharge Medications:  Allergies as of 11/01/2020       Reactions   Lotensin [benazepril Hcl] Cough   Penicillins Other (See Comments)   pt becomes "nervous" Has patient had a PCN reaction causing immediate rash, facial/tongue/throat  swelling, SOB or lightheadedness with hypotension:  Has patient had a PCN reaction causing severe rash involving mucus membranes or skin necrosis:  Has patient had a PCN reaction that required hospitalization  Has patient had a PCN reaction occurring within the last 10 years:  If all of the above answers are "NO", then may proceed with Cephalosporin use.        Medication List     STOP taking these medications    doxycycline 100 MG capsule Commonly known as: VIBRAMYCIN Replaced by: doxycycline 100 MG tablet   folic acid 1 MG tablet Commonly known as: FOLVITE   hydrochlorothiazide 25 MG tablet Commonly known as: HYDRODIURIL   pantoprazole 40 MG tablet Commonly known as: PROTONIX   potassium chloride 20 MEQ/15ML (10%) Soln       TAKE these medications    acetaminophen 325 MG tablet Commonly known as: TYLENOL Take 650 mg by mouth every 6 (six) hours as needed for mild pain.   brimonidine 0.2 % ophthalmic solution Commonly known as: ALPHAGAN Place 1 drop into both eyes every 8 (eight) hours.   dorzolamide-timolol 22.3-6.8 MG/ML ophthalmic solution Commonly known as: COSOPT Place 1 drop into both eyes 2 (two) times daily.   doxycycline 100 MG tablet Commonly known as: VIBRA-TABS Take 1 tablet (100 mg total) by mouth every 12 (twelve) hours. Replaces: doxycycline 100 MG capsule   insulin aspart 100 UNIT/ML injection Commonly known as: novoLOG Inject 2-10 Units into the skin 3 (three) times daily before meals. 8am, noon and 4pm. Sliding scale by the clinic   insulin glargine 100 UNIT/ML injection Commonly known as: LANTUS Inject 10-20 Units into the skin See admin instructions. If bs is  175 or below =10 units, if bs is 175 = 15 units. If bs is 200= 20 units   latanoprost 0.005 % ophthalmic solution Commonly known as: XALATAN Place 1 drop into both eyes at bedtime.   loratadine 10 MG tablet Commonly known as: CLARITIN Take 10 mg by mouth daily.   losartan  100 MG tablet Commonly known as: COZAAR Take 50 mg by mouth daily.   meclizine 25 MG tablet Commonly known as: ANTIVERT Take 1 tablet (25 mg total) by mouth 2 (two) times daily.   metoCLOPramide 10 MG tablet Commonly known as: REGLAN Take 1 tablet (10 mg total) by mouth every 6 (six) hours as needed (for residuals in feeding tube).   multivitamin with minerals Tabs tablet Take 1 tablet by mouth daily.        Discharge Instructions: Please refer to Patient Instructions section of EMR for full details.  Patient was counseled important signs and symptoms that should prompt return to medical care, changes in medications, dietary instructions, activity restrictions, and follow up appointments.   Follow-Up Appointments:  Follow-up Information     [facility] Follow up.   Specialty: Home Health Services Why: [facility] Contact information: [address] [place] [address] [phone]                 Ronnald Ramp, MD 11/01/2020, 7:42 PM PGY-1, [facility] Family Medicine

## 2020-11-05 LAB — CULTURE, BLOOD (ROUTINE X 2)
Culture: NO GROWTH
Culture: NO GROWTH

## 2021-01-14 ENCOUNTER — Ambulatory Visit (INDEPENDENT_AMBULATORY_CARE_PROVIDER_SITE_OTHER): Payer: Medicare PPO

## 2021-01-14 ENCOUNTER — Other Ambulatory Visit: Payer: Self-pay

## 2021-01-14 ENCOUNTER — Encounter (HOSPITAL_COMMUNITY): Payer: Self-pay | Admitting: Emergency Medicine

## 2021-01-14 ENCOUNTER — Ambulatory Visit (HOSPITAL_COMMUNITY)
Admission: EM | Admit: 2021-01-14 | Discharge: 2021-01-14 | Disposition: A | Discharge status: Home / Self Care | Payer: Medicare PPO | Attending: Physician Assistant | Admitting: Physician Assistant

## 2021-01-14 DIAGNOSIS — M25561 Pain in right knee: Secondary | ICD-10-CM

## 2021-01-14 DIAGNOSIS — M79671 Pain in right foot: Secondary | ICD-10-CM

## 2021-01-14 DIAGNOSIS — M25571 Pain in right ankle and joints of right foot: Secondary | ICD-10-CM

## 2021-01-14 DIAGNOSIS — W19XXXA Unspecified fall, initial encounter: Secondary | ICD-10-CM | POA: Diagnosis not present

## 2021-01-14 DIAGNOSIS — G8929 Other chronic pain: Secondary | ICD-10-CM

## 2021-01-14 NOTE — ED Provider Notes (Signed)
[facility]    CSN: [id number] Arrival date & time: 01/14/21  1031      History   Chief Complaint Chief Complaint  Patient presents with   Fall   Leg Pain   Knee Pain    right    HPI Bill Jackson is a 86 y.o. male.   Patient presents today accompanied by a family member following fall that occurred 2 days ago.  He reports his right knee gave out and he fell onto his right leg.  He is confident that he did not hit his head and denies any loss of consciousness, blurred vision, nausea, vomiting.  Patient reports that he has had weakness and pain in his right knee that has gradually been worsening.  This is at least partially related to history of CVA in 2014 but states this is gradually been worsening and he is having difficulty with ambulation.  He is afraid to walk as he is concerned that his knee will give out causing him to fall.  He reports pain is rated 5 on a 0-10 pain scale, localized to knee/ankle/foot, described as throbbing, worse with attempted ambulation, no alleviating factors identified.  He denies any numbness, weakness, paresthesias.  He has not tried any over-the-counter medications for symptom management.  He has not seen an orthopedic provider in the past.   Past Medical History:  Diagnosis Date   Diabetes mellitus    Dysphagia following cerebrovascular accident    PEG tube   Hyperlipidemia    Hypertension    Left shoulder pain    Right knee pain    Stroke [facility]) march 2014   R side weakness, slurred speech, dizziness, visual changes, ataxia; hemorrhagic cerebellar CVA, evacuated    Patient Active Problem List   Diagnosis Date Noted   Cellulitis of buttock 10/30/2020   Left shoulder pain 07/11/2013   Protein-calorie malnutrition, severe (HCC) 06/12/2013   Syncope 06/11/2013   Right shoulder pain 11/17/2012   Right sided weakness 11/17/2012   Low back pain 11/15/2012   Glaucoma 11/03/2012   Headache(784.0) 10/29/2012   Cough 10/29/2012    OA (osteoarthritis) of knee-right 10/28/2012   Accelerated hypertension 10/28/2012   Hypokalemia 10/28/2012   Acute cerebellar hemorrhage (HCC) 10/28/2012   Encounter for long-term (current) use of medications 10/28/2012   Type 2 diabetes mellitus (HCC) 10/25/2012   Hypertension 10/25/2012   Stroke (HCC) 10/01/2012    Past Surgical History:  Procedure Laterality Date   CARDIAC SURGERY     CRANIECTOMY N/A 10/25/2012   Procedure: Suboccipital Craniectomy for Evacuation of Cerebellar Hematoma;  Surgeon: Maeola Harman, MD;  Location: [facility];  Service: Neurosurgery;  Laterality: N/A;  Suboccipital Craniectomy for Evacuation of Cerebellar Hematoma   heart stents  x3   HERNIA REPAIR     PEG TUBE PLACEMENT     placed ?2014, changed 01/2014 (at [facility])   ROTATOR CUFF REPAIR         Home Medications    Prior to Admission medications   Medication Sig Start Date End Date Taking? Authorizing Provider  acetaminophen (TYLENOL) 325 MG tablet Take 650 mg by mouth every 6 (six) hours as needed for mild pain.    [provider]  brimonidine (ALPHAGAN) 0.2 % ophthalmic solution Place 1 drop into both eyes every 8 (eight) hours. 01/19/13   Lucrezia Starch, NP  dorzolamide-timolol (COSOPT) 22.3-6.8 MG/ML ophthalmic solution Place 1 drop into both eyes 2 (two) times daily.    [provider]  doxycycline (VIBRA-TABS) 100 MG tablet Take 1 tablet (100 mg total) by mouth every 12 (twelve) hours. 11/01/20   Simmons-Robinson, Makiera, MD  insulin aspart (NOVOLOG) 100 UNIT/ML injection Inject 2-10 Units into the skin 3 (three) times daily before meals. 8am, noon and 4pm. Sliding scale by the clinic    [provider]  insulin glargine (LANTUS) 100 UNIT/ML injection Inject 10-20 Units into the skin See admin instructions. If bs is  175 or below =10 units, if bs is 175 = 15 units. If bs is 200= 20 units    [provider]  latanoprost (XALATAN) 0.005 % ophthalmic solution Place 1  drop into both eyes at bedtime. 01/19/13   Lucrezia Starch, NP  loratadine (CLARITIN) 10 MG tablet Take 10 mg by mouth daily.    [provider]  losartan (COZAAR) 100 MG tablet Take 50 mg by mouth daily.    [provider]  meclizine (ANTIVERT) 25 MG tablet Take 1 tablet (25 mg total) by mouth 2 (two) times daily. 01/19/13   Lucrezia Starch, NP  metoCLOPramide (REGLAN) 10 MG tablet Take 1 tablet (10 mg total) by mouth every 6 (six) hours as needed (for residuals in feeding tube). 02/02/14   Shirleen Schirmer, PA-C  Multiple Vitamin (MULTIVITAMIN WITH MINERALS) TABS Take 1 tablet by mouth daily. 01/19/13   Lucrezia Starch, NP    Family History Family History  Family history unknown: Yes    Social History Social History   Tobacco Use   Smoking status: Every Day    Packs/day: 0.25    Years: 25.00    Pack years: 6.25    Types: Cigarettes    Last attempt to quit: 04/03/2013    Years since quitting: 7.7   Smokeless tobacco: Never  Substance Use Topics   Alcohol use: No   Drug use: No     Allergies   Lotensin [benazepril hcl] and Penicillins   Review of Systems Review of Systems  Constitutional:  Positive for activity change. Negative for appetite change, fatigue and fever.  Respiratory:  Negative for cough and shortness of breath.   Cardiovascular:  Negative for chest pain.  Gastrointestinal:  Negative for abdominal pain, diarrhea, nausea and vomiting.  Musculoskeletal:  Positive for arthralgias and gait problem. Negative for myalgias.  Neurological:  Negative for dizziness, light-headedness and headaches.    Physical Exam Triage Vital Signs ED Triage Vitals  Enc Vitals Group     BP 01/14/21 1231 (!) 136/55     Pulse Rate 01/14/21 1231 78     Resp 01/14/21 1231 17     Temp 01/14/21 1231 98.2 F (36.8 C)     Temp Source 01/14/21 1231 Oral     SpO2 01/14/21 1231 100 %     Weight --      Height --      Head Circumference --      Peak Flow --      Pain Score  01/14/21 1229 5     Pain Loc --      Pain Edu? --      Excl. in GC? --    No data found.  Updated Vital Signs BP (!) 136/55 (BP Location: Left Arm)   Pulse 78   Temp 98.2 F (36.8 C) (Oral)   Resp 17   SpO2 100%   Visual Acuity Right Eye Distance:   Left Eye Distance:   Bilateral Distance:    Right Eye Near:   Left Eye Near:  Bilateral Near:     Physical Exam Vitals reviewed.  Constitutional:      General: He is awake.     Appearance: Normal appearance. He is normal weight. He is not ill-appearing.     Comments: Very pleasant male appears stated age in no acute distress sitting comfortably in wheelchair  HENT:     Head: Normocephalic and atraumatic.     Mouth/Throat:     Pharynx: No oropharyngeal exudate or posterior oropharyngeal erythema.  Eyes:     Comments: Arcus senilis bilaterally   Cardiovascular:     Rate and Rhythm: Normal rate and regular rhythm.     Pulses:          Posterior tibial pulses are 1+ on the right side and 1+ on the left side.     Heart sounds: Normal heart sounds, S1 normal and S2 normal. No murmur heard. Pulmonary:     Effort: Pulmonary effort is normal.     Breath sounds: Normal breath sounds. No stridor. No wheezing, rhonchi or rales.     Comments: Clear to auscultation bilaterally Musculoskeletal:     Right knee: Swelling present. No erythema or bony tenderness. Decreased range of motion. Tenderness present over the medial joint line. No LCL laxity, MCL laxity, ACL laxity or PCL laxity.     Right lower leg: 2+ Edema present.     Left lower leg: No edema.     Comments: Right knee: Patient unable to get onto exam table for complete examination.  No ligamentous laxity on exam.  Nontender to patient over medial joint line.  No deformity noted.  Right ankle and foot:  Decreased range of motion with dorsiflexion and plantarflexion.  2+ edema to ankle.  Foot neurovascularly intact.  No deformity noted.  Neurological:     Mental Status: He is  alert.  Psychiatric:        Behavior: Behavior is cooperative.     UC Treatments / Results  Labs (all labs ordered are listed, but only abnormal results are displayed) Labs Reviewed - No data to display  EKG   Radiology DG Ankle Complete Right  Result Date: 01/14/2021 CLINICAL DATA:  Right ankle and foot pain and swelling after a fall. Initial encounter. EXAM: RIGHT ANKLE - COMPLETE 3+ VIEW COMPARISON:  None. FINDINGS: Soft tissues about the ankle appear swollen. Bones are osteopenic. A small well corticated fragment dorsal to the navicular bone may be due to remote fracture. No acute fracture is identified. IMPRESSION: Soft tissue swelling without acute bony or joint abnormality. Electronically Signed   By: Drusilla Kanner M.D.   On: 01/14/2021 13:24   DG Knee Complete 4 Views Right  Result Date: 01/14/2021 CLINICAL DATA:  Right knee pain since a fall.  Initial encounter. EXAM: RIGHT KNEE - COMPLETE 4+ VIEW COMPARISON:  None. FINDINGS: No evidence of fracture, dislocation, or joint effusion. No evidence of arthropathy or other focal bone abnormality. Soft tissues are unremarkable. IMPRESSION: Negative exam. Electronically Signed   By: Drusilla Kanner M.D.   On: 01/14/2021 13:25   DG Foot Complete Right  Result Date: 01/14/2021 CLINICAL DATA:  Right ankle and foot pain and swelling after a fall. Initial encounter. EXAM: RIGHT FOOT COMPLETE - 3+ VIEW COMPARISON:  None. FINDINGS: Bones are osteopenic. The patient has a late subacute to remote fracture of the diaphysis of the fifth metatarsal. No acute fracture is identified. No evidence of arthropathy. Soft tissues appear somewhat swollen. IMPRESSION: Negative for acute bony abnormality. Late  subacute to remote diaphyseal fracture fifth metatarsal. Soft tissues appear swollen. Electronically Signed   By: Drusilla Kannerhomas  Dalessio M.D.   On: 01/14/2021 13:27    Procedures Procedures (including critical care time)  Medications Ordered in  UC Medications - No data to display  Initial Impression / Assessment and Plan / UC Course  I have reviewed the triage vital signs and the nursing notes.  Pertinent labs & imaging results that were available during my care of the patient were reviewed by me and considered in my medical decision making (see chart for details).      X-ray of right knee/ankle/foot were all normal with no acute osseous abnormality.  Discussed patient utility of seeing orthopedic provider given increased knee pain with associated instability.  He was given contact information for local orthopedic group and encouraged to call them to schedule an appointment at his convenience.  Recommended he use RICE protocol for symptom relief.  He can use Tylenol for pain.  Discussed that if he has any recurrent falls need to be evaluated immediately.  Discussed that if he should ever hit his head or have any head injury related to fall he needs to go to the emergency room immediately for head CT to which she expressed understanding.  Discussed alarm symptoms that warrant emergent evaluation.  Recommended he follow-up with PCP within 1 week.  Strict return precautions given to which patient expressed understanding.  Final Clinical Impressions(s) / UC Diagnoses   Final diagnoses:  Fall, initial encounter  Chronic pain of right knee  Acute right ankle pain  Right foot pain     Discharge Instructions      Your x-rays were normal with no evidence of a fracture.  I am concerned there might be some instability in your knee due to soft tissue injury.  I think it is reasonable for you to follow-up with an orthopedic provider.  Please keep leg elevated and use Ace bandage and ice for symptom relief.  You can use Tylenol for pain.  If you have any falls where you hit your head you need to go to the emergency room immediately.  Please follow-up with your primary care provider this week.     ED Prescriptions   None    PDMP not  reviewed this encounter.   Jeani HawkingRaspet, Trenell Moxey K, PA-C 01/14/21 1340

## 2021-01-14 NOTE — Discharge Instructions (Addendum)
Your x-rays were normal with no evidence of a fracture.  I am concerned there might be some instability in your knee due to soft tissue injury.  I think it is reasonable for you to follow-up with an orthopedic provider.  Please keep leg elevated and use Ace bandage and ice for symptom relief.  You can use Tylenol for pain.  If you have any falls where you hit your head you need to go to the emergency room immediately.  Please follow-up with your primary care provider this week.

## 2021-01-14 NOTE — ED Triage Notes (Signed)
Pt presents with Right leg and knee pain. States Fell Sunday. States has had multiple episodes where knee gives out and falls.

## 2021-09-19 ENCOUNTER — Other Ambulatory Visit (HOSPITAL_COMMUNITY)
Admission: RE | Admit: 2021-09-19 | Discharge: 2021-09-19 | Disposition: A | Discharge status: Home / Self Care | Payer: Medicare PPO | Source: Other Acute Inpatient Hospital | Attending: Internal Medicine | Admitting: Internal Medicine

## 2021-09-19 DIAGNOSIS — E43 Unspecified severe protein-calorie malnutrition: Secondary | ICD-10-CM | POA: Diagnosis not present

## 2021-09-19 DIAGNOSIS — I69398 Other sequelae of cerebral infarction: Secondary | ICD-10-CM | POA: Diagnosis present

## 2021-09-19 DIAGNOSIS — Z931 Gastrostomy status: Secondary | ICD-10-CM | POA: Insufficient documentation

## 2021-09-19 DIAGNOSIS — E119 Type 2 diabetes mellitus without complications: Secondary | ICD-10-CM | POA: Insufficient documentation

## 2021-09-19 DIAGNOSIS — I10 Essential (primary) hypertension: Secondary | ICD-10-CM | POA: Diagnosis not present

## 2021-09-19 LAB — BASIC METABOLIC PANEL
Anion gap: 9 (ref 5–15)
BUN: 24 mg/dL — ABNORMAL HIGH (ref 8–23)
CO2: 26 mmol/L (ref 22–32)
Calcium: 8.7 mg/dL — ABNORMAL LOW (ref 8.9–10.3)
Chloride: 99 mmol/L (ref 98–111)
Creatinine, Ser: 1.36 mg/dL — ABNORMAL HIGH (ref 0.61–1.24)
GFR, Estimated: 51 mL/min — ABNORMAL LOW (ref >60)
Glucose, Bld: 214 mg/dL — ABNORMAL HIGH (ref 70–99)
Potassium: 4.1 mmol/L (ref 3.5–5.1)
Sodium: 134 mmol/L — ABNORMAL LOW (ref 135–145)

## 2021-09-19 LAB — MAGNESIUM: Magnesium: 1.9 mg/dL (ref 1.7–2.4)

## 2022-04-13 ENCOUNTER — Emergency Department (HOSPITAL_COMMUNITY)
Admission: EM | Admit: 2022-04-13 | Discharge: 2022-04-14 | Disposition: A | Discharge status: Home / Self Care | Payer: Medicare PPO | Attending: Emergency Medicine | Admitting: Emergency Medicine

## 2022-04-13 ENCOUNTER — Emergency Department (HOSPITAL_COMMUNITY): Payer: Medicare PPO

## 2022-04-13 ENCOUNTER — Other Ambulatory Visit: Payer: Self-pay

## 2022-04-13 ENCOUNTER — Encounter (HOSPITAL_COMMUNITY): Payer: Self-pay

## 2022-04-13 DIAGNOSIS — Z79899 Other long term (current) drug therapy: Secondary | ICD-10-CM | POA: Insufficient documentation

## 2022-04-13 DIAGNOSIS — E119 Type 2 diabetes mellitus without complications: Secondary | ICD-10-CM | POA: Diagnosis not present

## 2022-04-13 DIAGNOSIS — Z794 Long term (current) use of insulin: Secondary | ICD-10-CM | POA: Diagnosis not present

## 2022-04-13 DIAGNOSIS — H538 Other visual disturbances: Secondary | ICD-10-CM | POA: Insufficient documentation

## 2022-04-13 DIAGNOSIS — R4781 Slurred speech: Secondary | ICD-10-CM | POA: Insufficient documentation

## 2022-04-13 DIAGNOSIS — I10 Essential (primary) hypertension: Secondary | ICD-10-CM | POA: Insufficient documentation

## 2022-04-13 DIAGNOSIS — Z20822 Contact with and (suspected) exposure to covid-19: Secondary | ICD-10-CM | POA: Diagnosis not present

## 2022-04-13 DIAGNOSIS — R531 Weakness: Secondary | ICD-10-CM | POA: Insufficient documentation

## 2022-04-13 DIAGNOSIS — R519 Headache, unspecified: Secondary | ICD-10-CM | POA: Insufficient documentation

## 2022-04-13 DIAGNOSIS — R42 Dizziness and giddiness: Secondary | ICD-10-CM | POA: Insufficient documentation

## 2022-04-13 LAB — CBC WITH DIFFERENTIAL/PLATELET
Abs Immature Granulocytes: 0.01 10*3/uL (ref 0.00–0.07)
Basophils Absolute: 0 10*3/uL (ref 0.0–0.1)
Basophils Relative: 0 %
Eosinophils Absolute: 0 10*3/uL (ref 0.0–0.5)
Eosinophils Relative: 1 %
HCT: 41.5 % (ref 39.0–52.0)
Hemoglobin: 14.2 g/dL (ref 13.0–17.0)
Immature Granulocytes: 0 %
Lymphocytes Relative: 46 %
Lymphs Abs: 1.9 10*3/uL (ref 0.7–4.0)
MCH: 33.3 pg (ref 26.0–34.0)
MCHC: 34.2 g/dL (ref 30.0–36.0)
MCV: 97.2 fL (ref 80.0–100.0)
Monocytes Absolute: 0.4 10*3/uL (ref 0.1–1.0)
Monocytes Relative: 10 %
Neutro Abs: 1.7 10*3/uL (ref 1.7–7.7)
Neutrophils Relative %: 43 %
Platelets: 275 10*3/uL (ref 150–400)
RBC: 4.27 MIL/uL (ref 4.22–5.81)
RDW: 12.5 % (ref 11.5–15.5)
WBC: 4 10*3/uL (ref 4.0–10.5)
nRBC: 0 % (ref 0.0–0.2)

## 2022-04-13 LAB — BASIC METABOLIC PANEL
Anion gap: 8 (ref 5–15)
BUN: 12 mg/dL (ref 8–23)
CO2: 29 mmol/L (ref 22–32)
Calcium: 9 mg/dL (ref 8.9–10.3)
Chloride: 106 mmol/L (ref 98–111)
Creatinine, Ser: 1.35 mg/dL — ABNORMAL HIGH (ref 0.61–1.24)
GFR, Estimated: 51 mL/min — ABNORMAL LOW (ref >60)
Glucose, Bld: 117 mg/dL — ABNORMAL HIGH (ref 70–99)
Potassium: 3.8 mmol/L (ref 3.5–5.1)
Sodium: 143 mmol/L (ref 135–145)

## 2022-04-13 LAB — RESP PANEL BY RT-PCR (FLU A&B, COVID) ARPGX2
Influenza A by PCR: NEGATIVE
Influenza B by PCR: NEGATIVE
SARS Coronavirus 2 by RT PCR: NEGATIVE

## 2022-04-13 NOTE — ED Notes (Signed)
Call placed to lab to check status of Covid swab collected at 1248, per lab sample was never rcvd, this RN will collect and send sample

## 2022-04-13 NOTE — Progress Notes (Addendum)
TOC CSW spoke with pts god daughter, Michiel Cowboy.  Pt receives HH ([facility], but under a different name) for 3 days a week (MWF) from 9am-11am.  Pt currently has a walker.  Cedar Ditullio Tarpley-Carter, MSW, LCSW-A Pronouns:  She/Her/Hers [facility] Clinical Social Worker Direct Number:  [phone] [email or web address]

## 2022-04-13 NOTE — Progress Notes (Signed)
TOC CSW attempted to contact pts god daughter, Bill Jackson [phone] as requested.  CSW left HIPPA compliant message with my contact information.  CSW will continue with the attempt to contact Bill Jackson.   Bill Jackson, MSW, LCSW-A Pronouns:  She/Her/Hers [facility] Clinical Social Worker Direct Number:  [phone] [email or web address]

## 2022-04-13 NOTE — ED Provider Notes (Signed)
[name] EMERGENCY DEPARTMENT Provider Note   CSN: [id number] Arrival date & time: 04/13/22  1153     History {Add pertinent medical, surgical, social history, OB history to HPI:1} Chief Complaint  Patient presents with   Headache    Bill Jackson is a 87 y.o. male.   Headache     Past Medical History:  Diagnosis Date   Diabetes mellitus    Dysphagia following cerebrovascular accident    PEG tube   Hyperlipidemia    Hypertension    Left shoulder pain    Right knee pain    Stroke [facility]) march 2014   R side weakness, slurred speech, dizziness, visual changes, ataxia; hemorrhagic cerebellar CVA, evacuated   Past Surgical History:  Procedure Laterality Date   CARDIAC SURGERY     CRANIECTOMY N/A 10/25/2012   Procedure: Suboccipital Craniectomy for Evacuation of Cerebellar Hematoma;  Surgeon: Maeola Harman, MD;  Location: [facility];  Service: Neurosurgery;  Laterality: N/A;  Suboccipital Craniectomy for Evacuation of Cerebellar Hematoma   heart stents  x3   HERNIA REPAIR     PEG TUBE PLACEMENT     placed ?2014, changed 01/2014 (at [facility])   ROTATOR CUFF REPAIR        Home Medications Prior to Admission medications   Medication Sig Start Date End Date Taking? Authorizing Provider  acetaminophen (TYLENOL) 325 MG tablet Take 650 mg by mouth every 6 (six) hours as needed for mild pain.    [provider]  brimonidine (ALPHAGAN) 0.2 % ophthalmic solution Place 1 drop into both eyes every 8 (eight) hours. 01/19/13   Lucrezia Starch, NP  dorzolamide-timolol (COSOPT) 22.3-6.8 MG/ML ophthalmic solution Place 1 drop into both eyes 2 (two) times daily.    [provider]  doxycycline (VIBRA-TABS) 100 MG tablet Take 1 tablet (100 mg total) by mouth every 12 (twelve) hours. 11/01/20   Simmons-Robinson, Makiera, MD  insulin aspart (NOVOLOG) 100 UNIT/ML injection Inject 2-10 Units into the skin 3 (three) times daily before meals. 8am, noon and 4pm.  Sliding scale by the clinic    [provider]  insulin glargine (LANTUS) 100 UNIT/ML injection Inject 10-20 Units into the skin See admin instructions. If bs is  175 or below =10 units, if bs is 175 = 15 units. If bs is 200= 20 units    [provider]  latanoprost (XALATAN) 0.005 % ophthalmic solution Place 1 drop into both eyes at bedtime. 01/19/13   Lucrezia Starch, NP  loratadine (CLARITIN) 10 MG tablet Take 10 mg by mouth daily.    [provider]  losartan (COZAAR) 100 MG tablet Take 50 mg by mouth daily.    [provider]  meclizine (ANTIVERT) 25 MG tablet Take 1 tablet (25 mg total) by mouth 2 (two) times daily. 01/19/13   Lucrezia Starch, NP  metoCLOPramide (REGLAN) 10 MG tablet Take 1 tablet (10 mg total) by mouth every 6 (six) hours as needed (for residuals in feeding tube). 02/02/14   Shirleen Schirmer, PA-C  Multiple Vitamin (MULTIVITAMIN WITH MINERALS) TABS Take 1 tablet by mouth daily. 01/19/13   Lucrezia Starch, NP      Allergies    Lotensin [benazepril hcl] and Penicillins    Review of Systems   Review of Systems  Neurological:  Positive for headaches.    Physical Exam Updated Vital Signs BP (!) 116/94 (BP Location: Left Arm)   Pulse (!) 53   Temp 97.9 F (36.6 C) (Oral)  Resp 18   Ht 6' (1.829 m)   Wt 86.2 kg   SpO2 100%   BMI 25.77 kg/m  Physical Exam  ED Results / Procedures / Treatments   Labs (all labs ordered are listed, but only abnormal results are displayed) Labs Reviewed  BASIC METABOLIC PANEL - Abnormal; Notable for the following components:      Result Value   Glucose, Bld 117 (*)    Creatinine, Ser 1.35 (*)    GFR, Estimated 51 (*)    All other components within normal limits  RESP PANEL BY RT-PCR (FLU A&B, COVID) ARPGX2  CBC WITH DIFFERENTIAL/PLATELET    EKG None  Radiology CT Head Wo Contrast  Result Date: 04/13/2022 CLINICAL DATA:  Worsening headache EXAM: CT HEAD WITHOUT CONTRAST TECHNIQUE: Contiguous  axial images were obtained from the base of the skull through the vertex without intravenous contrast. RADIATION DOSE REDUCTION: This exam was performed according to the departmental dose-optimization program which includes automated exposure control, adjustment of the mA and/or kV according to patient size and/or use of iterative reconstruction technique. COMPARISON:  MRI 04/09/2018, CT brain 04/09/2018 FINDINGS: Brain: No acute territorial infarction, hemorrhage, or intracranial mass. Stable encephalomalacia within the right cerebellum. Atrophy and chronic small vessel ischemic changes of the white matter. Probable chronic lacunar infarcts in the pons. The ventricles are nonenlarged. Vascular: No hyperdense vessels. Vertebral and carotid vascular calcification Skull: Suboccipital craniotomy Sinuses/Orbits: No acute finding. Other: None IMPRESSION: 1. No CT evidence for acute intracranial abnormality. 2. Atrophy and chronic small vessel ischemic changes of the white matter. Stable right cerebellar encephalomalacia Electronically Signed   By: Jasmine Pang M.D.   On: 04/13/2022 15:28    Procedures Procedures  {Document cardiac monitor, telemetry assessment procedure when appropriate:1}  Medications Ordered in ED Medications - No data to display  ED Course/ Medical Decision Making/ A&P                           Medical Decision Making  ***  {Document critical care time when appropriate:1} {Document review of labs and clinical decision tools ie heart score, Chads2Vasc2 etc:1}  {Document your independent review of radiology images, and any outside records:1} {Document your discussion with family members, caretakers, and with consultants:1} {Document social determinants of health affecting pt's care:1} {Document your decision making why or why not admission, treatments were needed:1} Final Clinical Impression(s) / ED Diagnoses Final diagnoses:  None    Rx / DC Orders ED Discharge Orders      None

## 2022-04-13 NOTE — Discharge Instructions (Signed)
It appears that the findings are likely related to previous stroke.  [facility]  I do not see a reason to bring you in the hospital at this time however.

## 2022-04-13 NOTE — ED Provider Triage Note (Signed)
Emergency Medicine Provider Triage Evaluation Note  Bill Jackson , a 87 y.o. male  was evaluated in triage.  Pt complains of headache x3 days, particularly on the left side of his head.  He states that last night, he felt that his head was "swollen".  He so has numbness and tingling in the bilateral lower extremities.  He is also endorsing dizziness described as lightheadedness and some frequent falls.  When trying to ascertain when these particular symptoms started, his response varies from within a few days, 2 years or 5 years.  He does have a history of a stroke 10 years ago with residual right-sided deficits and speech difficulties..  Review of Systems  Positive:  Negative:   Physical Exam  BP 128/82   Pulse 76   Temp 98.2 F (36.8 C) (Oral)   Resp 14   Ht 6' (1.829 m)   Wt 86.2 kg   SpO2 99%   BMI 25.77 kg/m  Gen:   Awake, no distress   Resp:  Normal effort  MSK:   Moves extremities without difficulty  Other:  Mildly dysarthric.  Neuro exam consistent with right-sided deficits.  Medical Decision Making  Medically screening exam initiated at 12:27 PM.  Appropriate orders placed.  JERMANIE MINSHALL was informed that the remainder of the evaluation will be completed by another provider, this initial triage assessment does not replace that evaluation, and the importance of remaining in the ED until their evaluation is complete.     Janell Quiet, [place] 04/13/22 1229

## 2022-04-13 NOTE — ED Triage Notes (Addendum)
Patient complains of headache x 3 days that is dull starting in the middle of head going to  left side.  Hx of stroke 10 years ago with right sided deficits and slurred speech.  Patient complains of numbness tingling to bilateral legs.

## 2022-04-14 NOTE — ED Notes (Signed)
This EMT woke patient for vitals. The patient sounded confused and his speech sounded garbled upon first speaking. The patient refused vitals aside from blood pressure which is noted in the chart. The patient asked this EMT to leave and close the door. This EMT informed the RN of this incident.

## 2022-10-02 DIAGNOSIS — I619 Nontraumatic intracerebral hemorrhage, unspecified: Secondary | ICD-10-CM

## 2022-12-09 ENCOUNTER — Inpatient Hospital Stay (HOSPITAL_COMMUNITY)
Admission: EM | Admit: 2022-12-09 | Discharge: 2022-12-16 | DRG: 205 | Disposition: A | Discharge status: Skilled Nursing Facility | Payer: Medicare PPO | Attending: Internal Medicine | Admitting: Internal Medicine

## 2022-12-09 ENCOUNTER — Other Ambulatory Visit: Payer: Self-pay

## 2022-12-09 ENCOUNTER — Encounter (HOSPITAL_COMMUNITY): Payer: Self-pay | Admitting: Internal Medicine

## 2022-12-09 ENCOUNTER — Emergency Department (HOSPITAL_COMMUNITY): Payer: Medicare PPO

## 2022-12-09 ENCOUNTER — Observation Stay (HOSPITAL_COMMUNITY): Payer: Medicare PPO

## 2022-12-09 DIAGNOSIS — S2231XA Fracture of one rib, right side, initial encounter for closed fracture: Principal | ICD-10-CM | POA: Diagnosis present

## 2022-12-09 DIAGNOSIS — W19XXXA Unspecified fall, initial encounter: Secondary | ICD-10-CM | POA: Diagnosis present

## 2022-12-09 DIAGNOSIS — E871 Hypo-osmolality and hyponatremia: Secondary | ICD-10-CM | POA: Diagnosis present

## 2022-12-09 DIAGNOSIS — I959 Hypotension, unspecified: Secondary | ICD-10-CM | POA: Diagnosis not present

## 2022-12-09 DIAGNOSIS — W06XXXA Fall from bed, initial encounter: Secondary | ICD-10-CM | POA: Diagnosis present

## 2022-12-09 DIAGNOSIS — I10 Essential (primary) hypertension: Secondary | ICD-10-CM | POA: Diagnosis present

## 2022-12-09 DIAGNOSIS — Z955 Presence of coronary angioplasty implant and graft: Secondary | ICD-10-CM

## 2022-12-09 DIAGNOSIS — E785 Hyperlipidemia, unspecified: Secondary | ICD-10-CM | POA: Diagnosis present

## 2022-12-09 DIAGNOSIS — E860 Dehydration: Secondary | ICD-10-CM | POA: Diagnosis present

## 2022-12-09 DIAGNOSIS — F17210 Nicotine dependence, cigarettes, uncomplicated: Secondary | ICD-10-CM | POA: Diagnosis present

## 2022-12-09 DIAGNOSIS — I214 Non-ST elevation (NSTEMI) myocardial infarction: Secondary | ICD-10-CM | POA: Insufficient documentation

## 2022-12-09 DIAGNOSIS — M545 Low back pain, unspecified: Secondary | ICD-10-CM | POA: Diagnosis present

## 2022-12-09 DIAGNOSIS — Y92003 Bedroom of unspecified non-institutional (private) residence as the place of occurrence of the external cause: Secondary | ICD-10-CM

## 2022-12-09 DIAGNOSIS — R911 Solitary pulmonary nodule: Secondary | ICD-10-CM | POA: Diagnosis present

## 2022-12-09 DIAGNOSIS — M6282 Rhabdomyolysis: Secondary | ICD-10-CM | POA: Insufficient documentation

## 2022-12-09 DIAGNOSIS — I5041 Acute combined systolic (congestive) and diastolic (congestive) heart failure: Secondary | ICD-10-CM | POA: Insufficient documentation

## 2022-12-09 DIAGNOSIS — Z79899 Other long term (current) drug therapy: Secondary | ICD-10-CM

## 2022-12-09 DIAGNOSIS — I5020 Unspecified systolic (congestive) heart failure: Secondary | ICD-10-CM

## 2022-12-09 DIAGNOSIS — I69391 Dysphagia following cerebral infarction: Secondary | ICD-10-CM

## 2022-12-09 DIAGNOSIS — I2510 Atherosclerotic heart disease of native coronary artery without angina pectoris: Secondary | ICD-10-CM | POA: Diagnosis present

## 2022-12-09 DIAGNOSIS — Z880 Allergy status to penicillin: Secondary | ICD-10-CM

## 2022-12-09 DIAGNOSIS — Z8673 Personal history of transient ischemic attack (TIA), and cerebral infarction without residual deficits: Secondary | ICD-10-CM | POA: Diagnosis not present

## 2022-12-09 DIAGNOSIS — R0781 Pleurodynia: Secondary | ICD-10-CM | POA: Diagnosis not present

## 2022-12-09 DIAGNOSIS — R7989 Other specified abnormal findings of blood chemistry: Secondary | ICD-10-CM | POA: Insufficient documentation

## 2022-12-09 DIAGNOSIS — Z888 Allergy status to other drugs, medicaments and biological substances status: Secondary | ICD-10-CM

## 2022-12-09 DIAGNOSIS — Z8679 Personal history of other diseases of the circulatory system: Secondary | ICD-10-CM

## 2022-12-09 DIAGNOSIS — H409 Unspecified glaucoma: Secondary | ICD-10-CM | POA: Diagnosis present

## 2022-12-09 DIAGNOSIS — S2249XA Multiple fractures of ribs, unspecified side, initial encounter for closed fracture: Secondary | ICD-10-CM | POA: Insufficient documentation

## 2022-12-09 DIAGNOSIS — E876 Hypokalemia: Secondary | ICD-10-CM | POA: Diagnosis present

## 2022-12-09 DIAGNOSIS — E119 Type 2 diabetes mellitus without complications: Secondary | ICD-10-CM

## 2022-12-09 DIAGNOSIS — Z9181 History of falling: Secondary | ICD-10-CM

## 2022-12-09 DIAGNOSIS — I480 Paroxysmal atrial fibrillation: Secondary | ICD-10-CM

## 2022-12-09 DIAGNOSIS — I472 Ventricular tachycardia, unspecified: Secondary | ICD-10-CM | POA: Diagnosis present

## 2022-12-09 DIAGNOSIS — R001 Bradycardia, unspecified: Secondary | ICD-10-CM | POA: Insufficient documentation

## 2022-12-09 DIAGNOSIS — N1831 Chronic kidney disease, stage 3a: Secondary | ICD-10-CM

## 2022-12-09 DIAGNOSIS — N179 Acute kidney failure, unspecified: Secondary | ICD-10-CM | POA: Diagnosis present

## 2022-12-09 DIAGNOSIS — K760 Fatty (change of) liver, not elsewhere classified: Secondary | ICD-10-CM | POA: Diagnosis present

## 2022-12-09 DIAGNOSIS — E1140 Type 2 diabetes mellitus with diabetic neuropathy, unspecified: Secondary | ICD-10-CM | POA: Diagnosis present

## 2022-12-09 DIAGNOSIS — E870 Hyperosmolality and hypernatremia: Secondary | ICD-10-CM | POA: Insufficient documentation

## 2022-12-09 DIAGNOSIS — G9341 Metabolic encephalopathy: Secondary | ICD-10-CM | POA: Insufficient documentation

## 2022-12-09 DIAGNOSIS — I4729 Other ventricular tachycardia: Secondary | ICD-10-CM | POA: Insufficient documentation

## 2022-12-09 DIAGNOSIS — Z66 Do not resuscitate: Secondary | ICD-10-CM | POA: Diagnosis present

## 2022-12-09 DIAGNOSIS — E1122 Type 2 diabetes mellitus with diabetic chronic kidney disease: Secondary | ICD-10-CM

## 2022-12-09 DIAGNOSIS — R4781 Slurred speech: Secondary | ICD-10-CM | POA: Diagnosis present

## 2022-12-09 DIAGNOSIS — R55 Syncope and collapse: Secondary | ICD-10-CM | POA: Insufficient documentation

## 2022-12-09 DIAGNOSIS — I110 Hypertensive heart disease with heart failure: Secondary | ICD-10-CM | POA: Diagnosis present

## 2022-12-09 DIAGNOSIS — Z794 Long term (current) use of insulin: Secondary | ICD-10-CM

## 2022-12-09 DIAGNOSIS — Z931 Gastrostomy status: Secondary | ICD-10-CM

## 2022-12-09 DIAGNOSIS — K219 Gastro-esophageal reflux disease without esophagitis: Secondary | ICD-10-CM | POA: Diagnosis present

## 2022-12-09 DIAGNOSIS — I619 Nontraumatic intracerebral hemorrhage, unspecified: Secondary | ICD-10-CM

## 2022-12-09 LAB — I-STAT CHEM 8, ED
BUN: 12 mg/dL (ref 8–23)
Calcium, Ion: 1.11 mmol/L — ABNORMAL LOW (ref 1.15–1.40)
Chloride: 108 mmol/L (ref 98–111)
Creatinine, Ser: 1.4 mg/dL — ABNORMAL HIGH (ref 0.61–1.24)
Glucose, Bld: 73 mg/dL (ref 70–99)
HCT: 40 % (ref 39.0–52.0)
Hemoglobin: 13.6 g/dL (ref 13.0–17.0)
Potassium: 3.2 mmol/L — ABNORMAL LOW (ref 3.5–5.1)
Sodium: 148 mmol/L — ABNORMAL HIGH (ref 135–145)
TCO2: 23 mmol/L (ref 22–32)

## 2022-12-09 LAB — CBC WITH DIFFERENTIAL/PLATELET
Abs Immature Granulocytes: 0.03 10*3/uL (ref 0.00–0.07)
Basophils Absolute: 0 10*3/uL (ref 0.0–0.1)
Basophils Relative: 0 %
Eosinophils Absolute: 0 10*3/uL (ref 0.0–0.5)
Eosinophils Relative: 0 %
HCT: 40.7 % (ref 39.0–52.0)
Hemoglobin: 13.9 g/dL (ref 13.0–17.0)
Immature Granulocytes: 0 %
Lymphocytes Relative: 10 %
Lymphs Abs: 0.8 10*3/uL (ref 0.7–4.0)
MCH: 34.2 pg — ABNORMAL HIGH (ref 26.0–34.0)
MCHC: 34.2 g/dL (ref 30.0–36.0)
MCV: 100.2 fL — ABNORMAL HIGH (ref 80.0–100.0)
Monocytes Absolute: 0.9 10*3/uL (ref 0.1–1.0)
Monocytes Relative: 12 %
Neutro Abs: 6 10*3/uL (ref 1.7–7.7)
Neutrophils Relative %: 78 %
Platelets: 254 10*3/uL (ref 150–400)
RBC: 4.06 MIL/uL — ABNORMAL LOW (ref 4.22–5.81)
RDW: 12.7 % (ref 11.5–15.5)
WBC: 7.7 10*3/uL (ref 4.0–10.5)
nRBC: 0 % (ref 0.0–0.2)

## 2022-12-09 LAB — MAGNESIUM: Magnesium: 1.4 mg/dL — ABNORMAL LOW (ref 1.7–2.4)

## 2022-12-09 LAB — TROPONIN I (HIGH SENSITIVITY)
Troponin I (High Sensitivity): 3208 ng/L (ref <18)
Troponin I (High Sensitivity): 3838 ng/L (ref <18)

## 2022-12-09 LAB — BASIC METABOLIC PANEL
Anion gap: 17 — ABNORMAL HIGH (ref 5–15)
Anion gap: 14 (ref 5–15)
BUN: 13 mg/dL (ref 8–23)
BUN: 13 mg/dL (ref 8–23)
CO2: 25 mmol/L (ref 22–32)
CO2: 25 mmol/L (ref 22–32)
Calcium: 8.6 mg/dL — ABNORMAL LOW (ref 8.9–10.3)
Calcium: 8.2 mg/dL — ABNORMAL LOW (ref 8.9–10.3)
Chloride: 108 mmol/L (ref 98–111)
Chloride: 107 mmol/L (ref 98–111)
Creatinine, Ser: 1.54 mg/dL — ABNORMAL HIGH (ref 0.61–1.24)
Creatinine, Ser: 1.44 mg/dL — ABNORMAL HIGH (ref 0.61–1.24)
GFR, Estimated: 44 mL/min — ABNORMAL LOW (ref >60)
GFR, Estimated: 47 mL/min — ABNORMAL LOW (ref >60)
Glucose, Bld: 77 mg/dL (ref 70–99)
Glucose, Bld: 76 mg/dL (ref 70–99)
Potassium: 3.2 mmol/L — ABNORMAL LOW (ref 3.5–5.1)
Potassium: 3 mmol/L — ABNORMAL LOW (ref 3.5–5.1)
Sodium: 150 mmol/L — ABNORMAL HIGH (ref 135–145)
Sodium: 146 mmol/L — ABNORMAL HIGH (ref 135–145)

## 2022-12-09 LAB — CK
Total CK: 1017 U/L — ABNORMAL HIGH (ref 49–397)
Total CK: 1419 U/L — ABNORMAL HIGH (ref 49–397)

## 2022-12-09 LAB — LACTIC ACID, PLASMA
Lactic Acid, Venous: 1.8 mmol/L (ref 0.5–1.9)
Lactic Acid, Venous: 2.3 mmol/L (ref 0.5–1.9)

## 2022-12-09 MED ORDER — MECLIZINE HCL 25 MG PO TABS
25.0000 mg | ORAL_TABLET | Freq: Two times a day (BID) | ORAL | Status: DC
  Administered 2022-12-09 – 2022-12-10 (×2): 25 mg via ORAL
  Filled 2022-12-09 (×4): qty 1

## 2022-12-09 MED ORDER — POLYETHYLENE GLYCOL 3350 17 G PO PACK
17.0000 g | PACK | Freq: Every day | ORAL | Status: DC | PRN
  Administered 2022-12-09 – 2022-12-14 (×2): 17 g via ORAL
  Filled 2022-12-09: qty 1

## 2022-12-09 MED ORDER — POTASSIUM CHLORIDE 20 MEQ PO PACK
60.0000 meq | PACK | Freq: Two times a day (BID) | ORAL | Status: DC
  Administered 2022-12-09 – 2022-12-10 (×3): 60 meq via ORAL
  Filled 2022-12-09 (×3): qty 3

## 2022-12-09 MED ORDER — ASPIRIN 81 MG PO CHEW
324.0000 mg | CHEWABLE_TABLET | Freq: Once | ORAL | Status: DC
  Filled 2022-12-09: qty 4

## 2022-12-09 MED ORDER — SODIUM CHLORIDE 0.9% FLUSH
3.0000 mL | Freq: Two times a day (BID) | INTRAVENOUS | Status: DC
  Administered 2022-12-09 – 2022-12-15 (×11): 3 mL via INTRAVENOUS

## 2022-12-09 MED ORDER — MAGNESIUM SULFATE 2 GM/50ML IV SOLN
2.0000 g | Freq: Once | INTRAVENOUS | Status: AC
  Administered 2022-12-09: 2 g via INTRAVENOUS
  Filled 2022-12-09: qty 50

## 2022-12-09 MED ORDER — ACETAMINOPHEN 325 MG PO TABS
650.0000 mg | ORAL_TABLET | Freq: Four times a day (QID) | ORAL | Status: DC | PRN
  Administered 2022-12-09 – 2022-12-16 (×7): 650 mg via ORAL
  Filled 2022-12-09 (×7): qty 2

## 2022-12-09 MED ORDER — ACETAMINOPHEN 650 MG RE SUPP: 650.0000 mg | Freq: Four times a day (QID) | RECTAL | Status: DC | PRN

## 2022-12-09 MED ORDER — IOHEXOL 350 MG/ML SOLN
75.0000 mL | Freq: Once | INTRAVENOUS | Status: AC | PRN
  Administered 2022-12-09: 75 mL via INTRAVENOUS

## 2022-12-09 MED ORDER — FENTANYL CITRATE PF 50 MCG/ML IJ SOSY
50.0000 ug | PREFILLED_SYRINGE | Freq: Once | INTRAMUSCULAR | Status: AC
  Administered 2022-12-09: 50 ug via INTRAVENOUS
  Filled 2022-12-09: qty 1

## 2022-12-09 MED ORDER — SODIUM CHLORIDE 0.9 % IV SOLN: INTRAVENOUS | Status: DC

## 2022-12-09 MED ORDER — LATANOPROST 0.005 % OP SOLN
1.0000 [drp] | Freq: Every day | OPHTHALMIC | Status: DC
  Administered 2022-12-09 – 2022-12-15 (×7): 1 [drp] via OPHTHALMIC
  Filled 2022-12-09 (×2): qty 2.5

## 2022-12-09 MED ORDER — ENOXAPARIN SODIUM 40 MG/0.4ML IJ SOSY
40.0000 mg | PREFILLED_SYRINGE | INTRAMUSCULAR | Status: DC
  Administered 2022-12-09 – 2022-12-15 (×7): 40 mg via SUBCUTANEOUS
  Filled 2022-12-09 (×7): qty 0.4

## 2022-12-09 MED ORDER — OXYCODONE-ACETAMINOPHEN 5-325 MG PO TABS: 1.0000 | ORAL_TABLET | Freq: Four times a day (QID) | ORAL | 0 refills | Status: DC | PRN

## 2022-12-09 MED ORDER — DORZOLAMIDE HCL-TIMOLOL MAL 2-0.5 % OP SOLN
1.0000 [drp] | Freq: Two times a day (BID) | OPHTHALMIC | Status: DC
  Administered 2022-12-09 – 2022-12-16 (×14): 1 [drp] via OPHTHALMIC
  Filled 2022-12-09 (×2): qty 10

## 2022-12-09 MED ORDER — SODIUM CHLORIDE 0.9 % IV BOLUS
500.0000 mL | Freq: Once | INTRAVENOUS | Status: AC
  Administered 2022-12-09: 500 mL via INTRAVENOUS

## 2022-12-09 MED ORDER — BRIMONIDINE TARTRATE 0.2 % OP SOLN
1.0000 [drp] | Freq: Three times a day (TID) | OPHTHALMIC | Status: DC
  Administered 2022-12-09 – 2022-12-16 (×21): 1 [drp] via OPHTHALMIC
  Filled 2022-12-09: qty 5

## 2022-12-09 NOTE — Discharge Instructions (Addendum)
The CT scan showed that you have a lung nodule in the upper left lung.  You will need a repeat CT scan in 6 weeks to make sure this is not getting any bigger.  Please call your doctor in the morning to follow-up these results.  Unfortunately it often takes ribs approximately 1 month to heal and get better and during that time you will have some degree of pain which gets better over time.  Please make sure you are taking plenty of deep breaths  You do have 1 broken rib, please use the incentive spirometer to help take deep breaths.  I have also prescribed pain medication to help with your pain, take 1 tablet every 6 hours only as needed for severe pain.  This may make you nauseated or constipated so be careful using it and take a stool softener with it.  Thank you for allowing [place] to treat you in the emergency department today.  After reviewing your examination and potential testing that was done it appears that you are safe to go home.  I would like for you to follow-up with your doctor within the next several days, have them obtain your results and follow-up with them to review all of these tests.  If you should develop severe or worsening symptoms return to the emergency department immediately

## 2022-12-09 NOTE — Progress Notes (Signed)
Transition of Care [facility]) - Emergency Department Mini Assessment   [name] MRN: [id number] Date of Birth: Aug 21, 1934  Transition of Care [facility]) CM/SW Contact:    Oletta Cohn, RN Phone Number: 12/09/2022, 3:41 PM   Clinical Narrative: RNCM met with pt and daughter, Bill Jackson [phone] at bedside regarding discharge planning.  Pt lives at home alone and has Risk analyst (through [place]) come in M-W-F.  Bill Jackson states they have been working on getting additional coverage.  RNCM placed call to [facility] team and left message for a call back to assist with this matter. Will update team with findings.      ED Mini Assessment: What brought you to the Emergency Department? : (P) fall  Barriers to Discharge: (P) No Barriers Identified     Means of departure: (P) Car  Interventions which prevented an admission or readmission: (P) Home Health Consult or Services    Patient Contact and Communications     Spoke with: (P) Bill Jackson Contact Date: (P) 12/09/22,   Contact time: (P) 1509 Contact Phone Number: (P) [phone]    Patient states their goals for this hospitalization and ongoing recovery are:: (P) "get additional home care services after getting checked out for my fall"      Admission diagnosis:  fall Patient Active Problem List   Diagnosis Date Noted   Cellulitis of buttock 10/30/2020   Left shoulder pain 07/11/2013   Protein-calorie malnutrition, severe (HCC) 06/12/2013   Syncope 06/11/2013   Right shoulder pain 11/17/2012   Right sided weakness 11/17/2012   Low back pain 11/15/2012   Glaucoma 11/03/2012   Headache(784.0) 10/29/2012   Cough 10/29/2012   OA (osteoarthritis) of knee-right 10/28/2012   Accelerated hypertension 10/28/2012   Hypokalemia 10/28/2012   Acute cerebellar hemorrhage (HCC) 10/28/2012   Encounter for long-term (current) use of medications 10/28/2012   Type 2 diabetes mellitus (HCC) 10/25/2012    Hypertension 10/25/2012   Stroke (HCC) 10/01/2012   PCP:  Margit Hanks, MD (Inactive) Pharmacy:   [facility] [phone] Ginette Otto, [place] - [address] AT [facility] OF [address] [facility] [address] [place] [address] Phone: [phone] Fax: [phone]

## 2022-12-09 NOTE — ED Triage Notes (Signed)
Pt is coming from home via EMS after a fall where he rolled out of bed and was on the floor for an unknown amount of time. Pt denies LOC and is a&ox3. Pt complains of right sided thorax/rib/chest pain and right elbow pain with an abrasion to site. Pt denies any other pain.

## 2022-12-09 NOTE — ED Provider Notes (Signed)
Care of patient assumed from Dr. Hyacinth Meeker.  This patient presented after a fall last night followed by approximately 14 hours of downtime on the floor.  He lives independently and utilizes wheelchair and walker at home.  He was found to have a single rib fracture.  CK was mildly elevated.  He initially requested discharge home.  Currently, he has a home health aide this also 3 times a week.  Family is concerned of his mobility and fall risk.  Social work to see. Physical Exam  BP 132/84 (BP Location: Right Arm)   Pulse 98   Temp 98.1 F (36.7 C) (Oral)   Resp 18   Ht 6' (1.829 m)   Wt 83.9 kg   SpO2 100%   BMI 25.09 kg/m   Physical Exam Vitals and nursing note reviewed.  Constitutional:      General: He is not in acute distress.    Appearance: He is well-developed. He is not ill-appearing, toxic-appearing or diaphoretic.  HENT:     Head: Normocephalic and atraumatic.     Right Ear: External ear normal.     Left Ear: External ear normal.     Nose: Nose normal.     Mouth/Throat:     Mouth: Mucous membranes are moist.  Eyes:     Extraocular Movements: Extraocular movements intact.     Conjunctiva/sclera: Conjunctivae normal.  Cardiovascular:     Rate and Rhythm: Normal rate and regular rhythm.  Pulmonary:     Effort: Pulmonary effort is normal. No respiratory distress.     Breath sounds: No wheezing or rales.  Chest:     Chest wall: Tenderness present.  Abdominal:     General: There is no distension.     Palpations: Abdomen is soft.     Tenderness: There is no abdominal tenderness.  Musculoskeletal:        General: Swelling and tenderness present.     Cervical back: Normal range of motion and neck supple.     Comments: Swelling, erythema, and tenderness to bilateral knees and right elbow.  Skin:    General: Skin is warm and dry.     Capillary Refill: Capillary refill takes less than 2 seconds.     Coloration: Skin is not jaundiced or pale.  Neurological:     General: No  focal deficit present.     Mental Status: He is alert and oriented to person, place, and time.  Psychiatric:        Mood and Affect: Mood normal.     Procedures  Procedures  ED Course / MDM    Medical Decision Making Amount and/or Complexity of Data Reviewed Labs: ordered. Radiology: ordered.  Risk Prescription drug management. Decision regarding hospitalization.   On assessment, patient is resting on ED stretcher with daughter at bedside.  Daughter believes that he likely fell yesterday evening due to him still wearing his daytime clothes.  Estimated downtime is more likely 18 hours or more.  Patient endorses pain in bilateral knees and right elbow.  There is a mildly swollen and tender.  Left knee does have some warmth.  X-ray imaging studies were ordered.  Shortly prior to my assessment, patient did receive some fentanyl for analgesia.  I discussed abnormal lab findings with patient and daughter.  They are agreeable to admission.  Daughter reports that his speech seems more slurred than normal.  Will obtain CT of head and MRI.  Further lab work was notable for hypomagnesemia, uptrending CK, and markedly  elevated troponin.  Replacement magnesium was ordered.  ASA was given.  Patient denies any chest pain other than the area of his rib fracture.  Suspicion is for demand ischemia secondary to prolonged downtime.  Cardiology was consulted, however, no callback was ever obtained.  X-rays and CT imaging did not show acute findings.  Patient was admitted to medicine for further management.       Gloris Manchester, MD 12/10/22 0001

## 2022-12-09 NOTE — Discharge Planning (Signed)
Pt active at [facility] Medical Provider: Bartholomew Boards: Frazier Richards  Desk phone: [phone] Please call [facility] coordinator for additional information [phone]

## 2022-12-09 NOTE — ED Provider Notes (Signed)
[place] EMERGENCY DEPARTMENT AT [facility] Provider Note   CSN: [id number] Arrival date & time: 12/09/22  1219     History  Chief Complaint  Patient presents with   Fall    Pt coming from home with a fall where he was on the floor for an extended period of time. Complains of right sided thorax/chest/rib pain, and right elbow pain.     Bill Jackson is a 88 y.o. male.  HPI   This patient is a pleasant 88 year old male, he lives by himself, he has a nurse that comes to the house several times a week, he drinks his food as pured as he has had a prior stroke and actually required a feeding tube in the past.  He states that at some point overnight he fell out of bed and feels like he was on the floor since about 10:00 last night until about 12:00 today.  Family members reported that they could not get a hold of him so they had called paramedics to go check on him, the paramedics found the patient on the floor beside his bed on his back awake alert and answering questions.  He was complaining of right-sided rib pain from the fall.  Also complains of abdominal pain from the fall, there is no seizure activity no tongue biting no urination incontinence, no fecal incontinence, he denies headache or neck pain.  Patient does not take any anticoagulants  Home Medications Prior to Admission medications   Medication Sig Start Date End Date Taking? Authorizing Provider  oxyCODONE-acetaminophen (PERCOCET/ROXICET) 5-325 MG tablet Take 1 tablet by mouth every 6 (six) hours as needed for severe pain. 12/09/22  Yes Eber Hong, MD  acetaminophen (TYLENOL) 325 MG tablet Take 650 mg by mouth every 6 (six) hours as needed for mild pain.    [provider]  brimonidine (ALPHAGAN) 0.2 % ophthalmic solution Place 1 drop into both eyes every 8 (eight) hours. 01/19/13   Lucrezia Starch, NP  dorzolamide-timolol (COSOPT) 22.3-6.8 MG/ML ophthalmic solution Place 1 drop into both eyes 2 (two) times  daily.    [provider]  doxycycline (VIBRA-TABS) 100 MG tablet Take 1 tablet (100 mg total) by mouth every 12 (twelve) hours. 11/01/20   Simmons-Robinson, Makiera, MD  insulin aspart (NOVOLOG) 100 UNIT/ML injection Inject 2-10 Units into the skin 3 (three) times daily before meals. 8am, noon and 4pm. Sliding scale by the clinic    [provider]  insulin glargine (LANTUS) 100 UNIT/ML injection Inject 10-20 Units into the skin See admin instructions. If bs is  175 or below =10 units, if bs is 175 = 15 units. If bs is 200= 20 units    [provider]  latanoprost (XALATAN) 0.005 % ophthalmic solution Place 1 drop into both eyes at bedtime. 01/19/13   Lucrezia Starch, NP  loratadine (CLARITIN) 10 MG tablet Take 10 mg by mouth daily.    [provider]  losartan (COZAAR) 100 MG tablet Take 50 mg by mouth daily.    [provider]  meclizine (ANTIVERT) 25 MG tablet Take 1 tablet (25 mg total) by mouth 2 (two) times daily. 01/19/13   Lucrezia Starch, NP  metoCLOPramide (REGLAN) 10 MG tablet Take 1 tablet (10 mg total) by mouth every 6 (six) hours as needed (for residuals in feeding tube). 02/02/14   Shirleen Schirmer, PA-C  Multiple Vitamin (MULTIVITAMIN WITH MINERALS) TABS Take 1 tablet by mouth daily. 01/19/13   Lucrezia Starch, NP  Allergies    Lotensin [benazepril hcl] and Penicillins    Review of Systems   Review of Systems  All other systems reviewed and are negative.   Physical Exam Updated Vital Signs BP 132/84 (BP Location: Right Arm)   Pulse 98   Temp 98.1 F (36.7 C) (Oral)   Resp 18   Ht 1.829 m (6')   Wt 83.9 kg   SpO2 100%   BMI 25.09 kg/m  Physical Exam Vitals and nursing note reviewed.  Constitutional:      General: He is not in acute distress.    Appearance: He is well-developed.  HENT:     Head: Normocephalic and atraumatic.     Mouth/Throat:     Mouth: Mucous membranes are moist.     Pharynx: No oropharyngeal exudate.  Eyes:      General: No scleral icterus.       Right eye: No discharge.        Left eye: No discharge.     Conjunctiva/sclera: Conjunctivae normal.     Comments: Bilateral abnormal shaped pupil status post surgery  Neck:     Thyroid: No thyromegaly.     Vascular: No JVD.  Cardiovascular:     Rate and Rhythm: Normal rate and regular rhythm.     Heart sounds: Normal heart sounds. No murmur heard.    No friction rub. No gallop.  Pulmonary:     Effort: Pulmonary effort is normal. No respiratory distress.     Breath sounds: Normal breath sounds. No wheezing or rales.  Abdominal:     General: Bowel sounds are normal. There is no distension.     Palpations: Abdomen is soft. There is no mass.     Tenderness: There is no abdominal tenderness.  Musculoskeletal:        General: Tenderness present.     Cervical back: Normal range of motion and neck supple.     Right lower leg: No edema.     Left lower leg: No edema.     Comments: All 4 extremities appear normal without deformity or leg length shortening, he has normal-appearing bilateral upper extremities, with range of motion of the right arm there is some tenderness around the right ribs which are also tender to palpation but no crepitance or subcutaneous emphysema  Lymphadenopathy:     Cervical: No cervical adenopathy.  Skin:    General: Skin is warm and dry.     Findings: No erythema or rash.  Neurological:     Mental Status: He is alert.     Coordination: Coordination normal.     Comments: Alert, awake, follows commands, answers questions appropriately.  He is able to give me an accurate description of what happened  Psychiatric:        Behavior: Behavior normal.     ED Results / Procedures / Treatments   Labs (all labs ordered are listed, but only abnormal results are displayed) Labs Reviewed  CK - Abnormal; Notable for the following components:      Result Value   Total CK 1,017 (*)    All other components within normal limits   BASIC METABOLIC PANEL - Abnormal; Notable for the following components:   Sodium 150 (*)    Potassium 3.2 (*)    Creatinine, Ser 1.54 (*)    Calcium 8.6 (*)    GFR, Estimated 44 (*)    Anion gap 17 (*)    All other components within normal limits  CBC WITH  DIFFERENTIAL/PLATELET - Abnormal; Notable for the following components:   RBC 4.06 (*)    MCV 100.2 (*)    MCH 34.2 (*)    All other components within normal limits  I-STAT CHEM 8, ED - Abnormal; Notable for the following components:   Sodium 148 (*)    Potassium 3.2 (*)    Creatinine, Ser 1.40 (*)    Calcium, Ion 1.11 (*)    All other components within normal limits    EKG EKG Interpretation  Date/Time:  Wednesday Dec 09 2022 12:32:56 EDT Ventricular Rate:  95 PR Interval:  182 QRS Duration: 121 QT Interval:  426 QTC Calculation: 536 R Axis:   -74 Text Interpretation: Sinus rhythm RBBB and LAFB ST elevation suggests acute pericarditis Confirmed by Eber Hong ([id number]) on 12/09/2022 12:55:52 PM  Radiology CT CHEST ABDOMEN PELVIS W CONTRAST  Result Date: 12/09/2022 CLINICAL DATA:  Fall from bed, right-sided rib pain. EXAM: CT CHEST, ABDOMEN, AND PELVIS WITH CONTRAST TECHNIQUE: Multidetector CT imaging of the chest, abdomen and pelvis was performed following the standard protocol during bolus administration of intravenous contrast. RADIATION DOSE REDUCTION: This exam was performed according to the departmental dose-optimization program which includes automated exposure control, adjustment of the mA and/or kV according to patient size and/or use of iterative reconstruction technique. CONTRAST:  75mL OMNIPAQUE IOHEXOL 350 MG/ML SOLN COMPARISON:  CT pelvis 10/30/2020, CT abdomen pelvis 09/07/2016. FINDINGS: CT CHEST FINDINGS Cardiovascular: Atherosclerotic calcification of the aorta, aortic valve and coronary arteries. Heart is at the upper limits of normal in size. No pericardial effusion. Mediastinum/Nodes: No pathologically  enlarged mediastinal, hilar or axillary lymph nodes. Esophagus is grossly unremarkable. Lungs/Pleura: Image quality is degraded by respiratory motion. Mild dependent atelectasis bilaterally. 8 mm posterior left upper lobe nodule (5/46) with surrounding ground-glass. No pleural fluid. Airway is unremarkable. Musculoskeletal: Nondisplaced fracture of the posterolateral right eighth rib. CT ABDOMEN PELVIS FINDINGS Hepatobiliary: Liver is decreased in attenuation diffusely. Multiple hypoattenuating cysts in the liver. Liver and gallbladder are otherwise unremarkable. No biliary ductal dilatation. Pancreas: Negative. Spleen: Negative. Adrenals/Urinary Tract: Adrenal glands and kidneys are unremarkable. Ureters are decompressed. Right posterolateral bladder diverticulum. Bladder is otherwise grossly unremarkable. Stomach/Bowel: Stomach, small bowel, appendix and colon are unremarkable. Vascular/Lymphatic: Atherosclerotic calcification of the aorta. No pathologically enlarged lymph nodes. Reproductive: Prostate is visualized. Other: No free fluid.  Mesenteries and peritoneum are unremarkable. Musculoskeletal: Degenerative changes in the spine. Dextroconvex scoliosis. IMPRESSION: 1. Nondisplaced posterolateral right eighth rib fracture. No additional evidence of acute trauma. 2. 8 mm posterior left upper lobe nodule, possibly infectious/inflammatory in etiology. Recommend follow-up CT chest without contrast in 4-6 weeks in further evaluation, as clinically indicated, as malignancy cannot be excluded. 3. Hepatic steatosis. 4. Aortic atherosclerosis (ICD10-I70.0). Coronary artery calcification. Electronically Signed   By: Leanna Battles M.D.   On: 12/09/2022 14:12   DG Chest Port 1 View  Result Date: 12/09/2022 CLINICAL DATA:  Trauma EXAM: PORTABLE CHEST 1 VIEW COMPARISON:  CXR 09/22/14 FINDINGS: No pleural effusion. No pneumothorax. No focal airspace opacity. Normal cardiac and mediastinal contours. No radiographically  apparent displaced rib fractures. Visualized upper abdomen is unremarkable. IMPRESSION: No focal airspace opacity. Electronically Signed   By: Lorenza Cambridge M.D.   On: 12/09/2022 13:16   DG Pelvis Portable  Result Date: 12/09/2022 CLINICAL DATA:  Pain after trauma EXAM: PORTABLE PELVIS 1 VIEWS COMPARISON:  None Available. FINDINGS: Osteopenia. Hyperostosis. No fracture or dislocation. Preserved joint spaces of the hips and pubic symphysis. Mild degenerative changes of the  sacroiliac joints. Vascular calcifications. Overlapping cardiac leads. With this level of osteopenia evaluation for subtle nondisplaced injury is difficult to completely exclude and if there is further concern, additional cross-sectional imaging as clinically appropriate IMPRESSION: Osteopenia.  Mild degenerative changes. Electronically Signed   By: Karen Kays M.D.   On: 12/09/2022 13:12    Procedures Procedures    Medications Ordered in ED Medications  sodium chloride 0.9 % bolus 500 mL (0 mLs Intravenous Stopped 12/09/22 1402)  iohexol (OMNIPAQUE) 350 MG/ML injection 75 mL (75 mLs Intravenous Contrast Given 12/09/22 1335)  fentaNYL (SUBLIMAZE) injection 50 mcg (50 mcg Intravenous Given 12/09/22 1518)    ED Course/ Medical Decision Making/ A&P                             Medical Decision Making Amount and/or Complexity of Data Reviewed Labs: ordered. Radiology: ordered.  Risk Prescription drug management.    This patient presents to the ED for concern of fall, unfortunately he fell without his life alert bracelet on, this involves an extensive number of treatment options, and is a complaint that carries with it a high risk of complications and morbidity.  The differential diagnosis includes possible fractures of the ribs, rhabdomyolysis   Co morbidities that complicate the patient evaluation  Hypertension, elderly   Additional history obtained:  Additional history obtained from medical record External records  from outside source obtained and reviewed including multiple prior office visits with family doctors, going back over time I do not see any recent admissions to the hospital in the last couple of years, admitted for cellulitis in March 2022, PEG tube was noted as far back as 2016   Lab Tests:  I Ordered, and personally interpreted labs.  The pertinent results include: Minimal hyponatremia, renal function is at baseline, mild creatine kinase elevation at 1000 but no renal dysfunction, IV fluids given   Imaging Studies ordered:  I ordered imaging studies including CT scan of the chest abdomen and pelvis shows a single rib fracture of the right eighth rib posteriorly, no pneumothorax, no intra-abdominal pathology, there was a lung nodule seen I independently visualized and interpreted imaging which showed lung nodule and rib fracture as noted I agree with the radiologist interpretation   Cardiac Monitoring: / EKG:  The patient was maintained on a cardiac monitor.  I personally viewed and interpreted the cardiac monitored which showed an underlying rhythm of: Normal sinus rhythm   Consultations Obtained:  I asked social work and transition of care team to see the patient to see if they could increase his home health services.  At the time of change of shift care signed out to Dr. Durwin Nora to follow-up recommendations and disposition accordingly.   Problem List / ED Course / Critical interventions / Medication management  Patient well-appearing, given pain medication and an incentive spirometer to treat the rib fracture, informed of all of his results including the nodule of the lung. I ordered medication including fentanyl and IV fluids for rib fracture and mild rhabdo Reevaluation of the patient after these medicines showed that the patient improved I have reviewed the patients home medicines and have made adjustments as needed   Social Determinants of Health:  Prior stroke, has home  health services but has some difficulty getting around   Test / Admission - Considered:  Anticipate discharge if able to set up home health services and engage family for adequate care at home  Final Clinical Impression(s) / ED Diagnoses Final diagnoses:  Closed fracture of one rib of right side, initial encounter  Lung nodule    Rx / DC Orders ED Discharge Orders          Ordered    oxyCODONE-acetaminophen (PERCOCET/ROXICET) 5-325 MG tablet  Every 6 hours PRN        12/09/22 1526              Eber Hong, MD 12/09/22 1528

## 2022-12-09 NOTE — Progress Notes (Signed)
Lab report critical lab lactic acid 2.3 MD was informeed

## 2022-12-09 NOTE — ED Notes (Signed)
He did not pass his swallow study. He swallowed the water and when asked said that it did not go down right.

## 2022-12-09 NOTE — Progress Notes (Signed)
Visited with patient and provided emotional and spiritual support. Provided snacks for wife per her request. Chaplain available as needed.  Venida Jarvis, [place], [facility], Pager [phone]

## 2022-12-09 NOTE — ED Notes (Signed)
ED TO INPATIENT HANDOFF REPORT  ED Nurse Name and Phone #:  Marisue Ivan [phone]  R Name/Age/Gender Margit Hanks 88 y.o. male Room/Bed: 024C/024C  Code Status   Code Status: DNR  Home/SNF/Other Home Patient oriented to: self and place Is this baseline? Yes   Triage Complete: Triage complete  Chief Complaint Fall [W19.XXXA]  Triage Note Pt is coming from home via EMS after a fall where he rolled out of bed and was on the floor for an unknown amount of time. Pt denies LOC and is a&ox3. Pt complains of right sided thorax/rib/chest pain and right elbow pain with an abrasion to site. Pt denies any other pain.   Allergies Allergies  Allergen Reactions   Lotensin [Benazepril Hcl] Cough   Penicillins Other (See Comments)    pt becomes "nervous" Has patient had a PCN reaction causing immediate rash, facial/tongue/throat swelling, SOB or lightheadedness with hypotension:  Has patient had a PCN reaction causing severe rash involving mucus membranes or skin necrosis:  Has patient had a PCN reaction that required hospitalization  Has patient had a PCN reaction occurring within the last 10 years:  If all of the above answers are "NO", then may proceed with Cephalosporin use.     Level of Care/Admitting Diagnosis ED Disposition     ED Disposition  Admit   Condition  --   Comment  Hospital Area: [name] [[id number]]  Level of Care: Telemetry Medical [104]  May place patient in observation at [facility] or [facility] if equivalent level of care is available:: No  Covid Evaluation: Asymptomatic - no recent exposure (last 10 days) testing not required  Diagnosis: Fall [[id number]]  Admitting Physician: Synetta Fail [[id number]]  Attending Physician: Synetta Fail [[id number]]          B Medical/Surgery History Past Medical History:  Diagnosis Date   Diabetes mellitus    Dysphagia following cerebrovascular accident    PEG tube   Hyperlipidemia     Hypertension    Left shoulder pain    Right knee pain    Stroke [facility] march 2014   R side weakness, slurred speech, dizziness, visual changes, ataxia; hemorrhagic cerebellar CVA, evacuated   Past Surgical History:  Procedure Laterality Date   CARDIAC SURGERY     CRANIECTOMY N/A 10/25/2012   Procedure: Suboccipital Craniectomy for Evacuation of Cerebellar Hematoma;  Surgeon: Maeola Harman, MD;  Location: [facility];  Service: Neurosurgery;  Laterality: N/A;  Suboccipital Craniectomy for Evacuation of Cerebellar Hematoma   heart stents  x3   HERNIA REPAIR     PEG TUBE PLACEMENT     placed ?2014, changed 01/2014 (at [facility])   ROTATOR CUFF REPAIR       A IV Location/Drains/Wounds Patient Lines/Drains/Airways Status     Active Line/Drains/Airways     Name Placement date Placement time Site Days   Peripheral IV 12/09/22 20 G Anterior;Distal;Left;Upper Arm 12/09/22  1248  Arm  less than 1   Gastrostomy/Enterostomy Gastrostomy 22 Fr. LUQ 06/30/15  0050  LUQ  2719            Intake/Output Last 24 hours No intake or output data in the 24 hours ending 12/09/22 1816  Labs/Imaging Results for orders placed or performed during the hospital encounter of 12/09/22 (from the past 48 hour(s))  CK     Status: Abnormal   Collection Time: 12/09/22 12:52 PM  Result Value Ref Range   Total CK 1,017 (H) 49 -  397 U/L    Comment: Performed at [facility], [address], [place], [place] [address]  Basic metabolic panel     Status: Abnormal   Collection Time: 12/09/22 12:52 PM  Result Value Ref Range   Sodium 150 (H) 135 - 145 mmol/L   Potassium 3.2 (L) 3.5 - 5.1 mmol/L   Chloride 108 98 - 111 mmol/L   CO2 25 22 - 32 mmol/L   Glucose, Bld 77 70 - 99 mg/dL    Comment: Glucose reference range applies only to samples taken after fasting for at least 8 hours.   BUN 13 8 - 23 mg/dL   Creatinine, Ser 1.91 (H) 0.61 - 1.24 mg/dL   Calcium 8.6 (L) 8.9 - 10.3 mg/dL   GFR, Estimated 44 (L) >60  mL/min    Comment: (NOTE) Calculated using the CKD-EPI Creatinine Equation (2021)    Anion gap 17 (H) 5 - 15    Comment: Performed at [facility], [address] [place], [place] [address]  CBC with Differential     Status: Abnormal   Collection Time: 12/09/22 12:52 PM  Result Value Ref Range   WBC 7.7 4.0 - 10.5 K/uL   RBC 4.06 (L) 4.22 - 5.81 MIL/uL   Hemoglobin 13.9 13.0 - 17.0 g/dL   HCT 56.2 13.0 - 86.5 %   MCV 100.2 (H) 80.0 - 100.0 fL   MCH 34.2 (H) 26.0 - 34.0 pg   MCHC 34.2 30.0 - 36.0 g/dL   RDW 78.4 69.6 - 29.5 %   Platelets 254 150 - 400 K/uL   nRBC 0.0 0.0 - 0.2 %   Neutrophils Relative % 78 %   Neutro Abs 6.0 1.7 - 7.7 K/uL   Lymphocytes Relative 10 %   Lymphs Abs 0.8 0.7 - 4.0 K/uL   Monocytes Relative 12 %   Monocytes Absolute 0.9 0.1 - 1.0 K/uL   Eosinophils Relative 0 %   Eosinophils Absolute 0.0 0.0 - 0.5 K/uL   Basophils Relative 0 %   Basophils Absolute 0.0 0.0 - 0.1 K/uL   Immature Granulocytes 0 %   Abs Immature Granulocytes 0.03 0.00 - 0.07 K/uL    Comment: Performed at [facility], [address], [place], [place] [address]  I-stat chem 8, ED (not at [facility], [facility] or [facility])     Status: Abnormal   Collection Time: 12/09/22 12:56 PM  Result Value Ref Range   Sodium 148 (H) 135 - 145 mmol/L   Potassium 3.2 (L) 3.5 - 5.1 mmol/L   Chloride 108 98 - 111 mmol/L   BUN 12 8 - 23 mg/dL   Creatinine, Ser 2.44 (H) 0.61 - 1.24 mg/dL   Glucose, Bld 73 70 - 99 mg/dL    Comment: Glucose reference range applies only to samples taken after fasting for at least 8 hours.   Calcium, Ion 1.11 (L) 1.15 - 1.40 mmol/L   TCO2 23 22 - 32 mmol/L   Hemoglobin 13.6 13.0 - 17.0 g/dL   HCT 01.0 27.2 - 53.6 %  CK     Status: Abnormal   Collection Time: 12/09/22  4:21 PM  Result Value Ref Range   Total CK 1,419 (H) 49 - 397 U/L    Comment: Performed at [facility], [address] [place], [place] [address]  Troponin I (High Sensitivity)     Status: Abnormal    Collection Time: 12/09/22  4:21 PM  Result Value Ref Range  Troponin I (High Sensitivity) 3,208 (HH) <18 ng/L    Comment: CRITICAL RESULT CALLED TO, READ BACK BY AND VERIFIED WITH Ginette Otto, RN @ [phone] 12/09/22 BY SEKDAHL (NOTE) Elevated high sensitivity troponin I (hsTnI) values and significant  changes across serial measurements may suggest ACS but many other  chronic and acute conditions are known to elevate hsTnI results.  Refer to the "Links" section for chest pain algorithms and additional  guidance. Performed at [facility], [address], [place], [place] [address]   Basic metabolic panel     Status: Abnormal   Collection Time: 12/09/22  4:21 PM  Result Value Ref Range   Sodium 146 (H) 135 - 145 mmol/L   Potassium 3.0 (L) 3.5 - 5.1 mmol/L   Chloride 107 98 - 111 mmol/L   CO2 25 22 - 32 mmol/L   Glucose, Bld 76 70 - 99 mg/dL    Comment: Glucose reference range applies only to samples taken after fasting for at least 8 hours.   BUN 13 8 - 23 mg/dL   Creatinine, Ser 4.09 (H) 0.61 - 1.24 mg/dL   Calcium 8.2 (L) 8.9 - 10.3 mg/dL   GFR, Estimated 47 (L) >60 mL/min    Comment: (NOTE) Calculated using the CKD-EPI Creatinine Equation (2021)    Anion gap 14 5 - 15    Comment: Performed at [facility], [address] [place], [place] [address]  Magnesium     Status: Abnormal   Collection Time: 12/09/22  4:21 PM  Result Value Ref Range   Magnesium 1.4 (L) 1.7 - 2.4 mg/dL    Comment: Performed at [facility], [address] [place], [place] [address]   DG Elbow 2 Views Right  Result Date: 12/09/2022 CLINICAL DATA:  Fall, patient rolled out of bed and was on the floor for unknown amount of time. EXAM: RIGHT ELBOW - 2 VIEW COMPARISON:  None Available. FINDINGS: There is no evidence of fracture, dislocation, or joint effusion. There is no evidence of arthropathy or other focal bone abnormality. Soft tissues are unremarkable. IMPRESSION: Negative.  Electronically Signed   By: Larose Hires D.O.   On: 12/09/2022 17:34   DG Knee 2 Views Left  Result Date: 12/09/2022 CLINICAL DATA:  Fall patient rolled out of bed and was on the floor for unknown amount of time. EXAM: LEFT KNEE - 1-2 VIEW COMPARISON:  None Available. FINDINGS: No evidence of fracture, dislocation, or joint effusion. Tricompartmental knee joint space narrowing. Vascular calcifications. IMPRESSION: 1. No acute fracture or dislocation. 2. Tricompartmental knee joint space narrowing suggesting mild-to-moderate osteoarthritis. Electronically Signed   By: Larose Hires D.O.   On: 12/09/2022 17:32   DG Knee 2 Views Right  Result Date: 12/09/2022 CLINICAL DATA:  Fall.  Rolled out of bed and was on the floor EXAM: RIGHT KNEE - 1-2 VIEW COMPARISON:  None Available. FINDINGS: No evidence of fracture, dislocation, or joint effusion. Tricompartmental knee joint space narrowing. Soft tissues are unremarkable. IMPRESSION: 1. No fracture or dislocation. 2. Mild knee osteoarthritis. Electronically Signed   By: Larose Hires D.O.   On: 12/09/2022 17:31   CT Head Wo Contrast  Result Date: 12/09/2022 CLINICAL DATA:  Neuro deficit.  Acute stroke suspected. EXAM: CT HEAD WITHOUT CONTRAST TECHNIQUE: Contiguous axial images were obtained from the base of the skull through the vertex without intravenous contrast. RADIATION DOSE REDUCTION: This exam was performed according to the departmental dose-optimization program which includes automated exposure  control, adjustment of the mA and/or kV according to patient size and/or use of iterative reconstruction technique. COMPARISON:  04/13/2022 FINDINGS: Brain: There is no evidence for acute hemorrhage, hydrocephalus, mass lesion, or abnormal extra-axial fluid collection. No definite CT evidence for acute infarction. Diffuse loss of parenchymal volume is consistent with atrophy. Patchy low attenuation in the deep hemispheric and periventricular white matter is nonspecific,  but likely reflects chronic microvascular ischemic demyelination. Cerebellar encephalomalacia is stable with evidence of prior suboccipital craniectomy. Vascular: No hyperdense vessel or unexpected calcification. Skull: Suboccipital craniectomy defect evident. Sinuses/Orbits: The visualized paranasal sinuses and mastoid air cells are clear. Visualized portions of the globes and intraorbital fat are unremarkable. Other: None. IMPRESSION: 1. Stable.  No acute intracranial abnormality. 2. Atrophy with chronic small vessel ischemic disease. 3. Status post suboccipital craniectomy with stable cerebellar encephalomalacia. Electronically Signed   By: Kennith Center M.D.   On: 12/09/2022 17:06   CT CHEST ABDOMEN PELVIS W CONTRAST  Result Date: 12/09/2022 CLINICAL DATA:  Fall from bed, right-sided rib pain. EXAM: CT CHEST, ABDOMEN, AND PELVIS WITH CONTRAST TECHNIQUE: Multidetector CT imaging of the chest, abdomen and pelvis was performed following the standard protocol during bolus administration of intravenous contrast. RADIATION DOSE REDUCTION: This exam was performed according to the departmental dose-optimization program which includes automated exposure control, adjustment of the mA and/or kV according to patient size and/or use of iterative reconstruction technique. CONTRAST:  75mL OMNIPAQUE IOHEXOL 350 MG/ML SOLN COMPARISON:  CT pelvis 10/30/2020, CT abdomen pelvis 09/07/2016. FINDINGS: CT CHEST FINDINGS Cardiovascular: Atherosclerotic calcification of the aorta, aortic valve and coronary arteries. Heart is at the upper limits of normal in size. No pericardial effusion. Mediastinum/Nodes: No pathologically enlarged mediastinal, hilar or axillary lymph nodes. Esophagus is grossly unremarkable. Lungs/Pleura: Image quality is degraded by respiratory motion. Mild dependent atelectasis bilaterally. 8 mm posterior left upper lobe nodule (5/46) with surrounding ground-glass. No pleural fluid. Airway is unremarkable.  Musculoskeletal: Nondisplaced fracture of the posterolateral right eighth rib. CT ABDOMEN PELVIS FINDINGS Hepatobiliary: Liver is decreased in attenuation diffusely. Multiple hypoattenuating cysts in the liver. Liver and gallbladder are otherwise unremarkable. No biliary ductal dilatation. Pancreas: Negative. Spleen: Negative. Adrenals/Urinary Tract: Adrenal glands and kidneys are unremarkable. Ureters are decompressed. Right posterolateral bladder diverticulum. Bladder is otherwise grossly unremarkable. Stomach/Bowel: Stomach, small bowel, appendix and colon are unremarkable. Vascular/Lymphatic: Atherosclerotic calcification of the aorta. No pathologically enlarged lymph nodes. Reproductive: Prostate is visualized. Other: No free fluid.  Mesenteries and peritoneum are unremarkable. Musculoskeletal: Degenerative changes in the spine. Dextroconvex scoliosis. IMPRESSION: 1. Nondisplaced posterolateral right eighth rib fracture. No additional evidence of acute trauma. 2. 8 mm posterior left upper lobe nodule, possibly infectious/inflammatory in etiology. Recommend follow-up CT chest without contrast in 4-6 weeks in further evaluation, as clinically indicated, as malignancy cannot be excluded. 3. Hepatic steatosis. 4. Aortic atherosclerosis (ICD10-I70.0). Coronary artery calcification. Electronically Signed   By: Leanna Battles M.D.   On: 12/09/2022 14:12   DG Chest Port 1 View  Result Date: 12/09/2022 CLINICAL DATA:  Trauma EXAM: PORTABLE CHEST 1 VIEW COMPARISON:  CXR 09/22/14 FINDINGS: No pleural effusion. No pneumothorax. No focal airspace opacity. Normal cardiac and mediastinal contours. No radiographically apparent displaced rib fractures. Visualized upper abdomen is unremarkable. IMPRESSION: No focal airspace opacity. Electronically Signed   By: Lorenza Cambridge M.D.   On: 12/09/2022 13:16   DG Pelvis Portable  Result Date: 12/09/2022 CLINICAL DATA:  Pain after trauma EXAM: PORTABLE PELVIS 1 VIEWS COMPARISON:   None Available. FINDINGS: Osteopenia.  Hyperostosis. No fracture or dislocation. Preserved joint spaces of the hips and pubic symphysis. Mild degenerative changes of the sacroiliac joints. Vascular calcifications. Overlapping cardiac leads. With this level of osteopenia evaluation for subtle nondisplaced injury is difficult to completely exclude and if there is further concern, additional cross-sectional imaging as clinically appropriate IMPRESSION: Osteopenia.  Mild degenerative changes. Electronically Signed   By: Karen Kays M.D.   On: 12/09/2022 13:12    Pending Labs Unresulted Labs (From admission, onward)     Start     Ordered   12/16/22 0500  Creatinine, serum  (enoxaparin (LOVENOX)    CrCl >/= 30 ml/min)  Weekly,   R     Comments: while on enoxaparin therapy    12/09/22 1809   12/10/22 0500  Comprehensive metabolic panel  Tomorrow morning,   R        12/09/22 1809   12/10/22 0500  CBC  Tomorrow morning,   R        12/09/22 1809   12/09/22 1630  Lactic acid, plasma  Now then every 2 hours,   R (with STAT occurrences)      12/09/22 1629            Vitals/Pain Today's Vitals   12/09/22 1237 12/09/22 1240 12/09/22 1245 12/09/22 1615  BP:   132/84   Pulse:   98   Resp:   18   Temp:   98.1 F (36.7 C)   TempSrc:   Oral   SpO2: 95%  100%   Weight:  83.9 kg    Height:  6' (1.829 m)    PainSc:  8  8  4      Isolation Precautions No active isolations  Medications Medications  magnesium sulfate IVPB 2 g 50 mL (has no administration in time range)  aspirin chewable tablet 324 mg (has no administration in time range)  latanoprost (XALATAN) 0.005 % ophthalmic solution 1 drop (has no administration in time range)  dorzolamide-timolol (COSOPT) 2-0.5 % ophthalmic solution 1 drop (has no administration in time range)  brimonidine (ALPHAGAN) 0.2 % ophthalmic solution 1 drop (has no administration in time range)  meclizine (ANTIVERT) tablet 25 mg (has no administration in time  range)  sodium chloride flush (NS) 0.9 % injection 3 mL (has no administration in time range)  acetaminophen (TYLENOL) tablet 650 mg (has no administration in time range)    Or  acetaminophen (TYLENOL) suppository 650 mg (has no administration in time range)  polyethylene glycol (MIRALAX / GLYCOLAX) packet 17 g (has no administration in time range)  enoxaparin (LOVENOX) injection 40 mg (has no administration in time range)  sodium chloride 0.9 % bolus 500 mL (0 mLs Intravenous Stopped 12/09/22 1402)  iohexol (OMNIPAQUE) 350 MG/ML injection 75 mL (75 mLs Intravenous Contrast Given 12/09/22 1335)  fentaNYL (SUBLIMAZE) injection 50 mcg (50 mcg Intravenous Given 12/09/22 1518)    Mobility walks with device     Focused Assessments Cardiac Assessment Handoff:  Cardiac Rhythm: Normal sinus rhythm Lab Results  Component Value Date   CKTOTAL 1,419 (H) 12/09/2022   TROPONINI <0.30 06/11/2013   No results found for: "DDIMER" Does the Patient currently have chest pain? No   , Neuro Assessment Handoff:  Swallow screen pass? Yes  Cardiac Rhythm: Normal sinus rhythm       Neuro Assessment:   Neuro Checks:      Has TPA been given? No If patient is a Neuro Trauma and patient is going to OR before  floor call report to 4N Charge nurse: [phone] or [phone]   R Recommendations: See Admitting Provider Note  Report given to:   Additional Notes:

## 2022-12-09 NOTE — ED Notes (Signed)
Pt is a&ox3, and complains of pain to right side of chest/thorax/rib area. Pain to right elbow with abrasion noted. Pt denies any other pain. Pt changed into a gown, attached to monitor/vitals. Side rails up x 2, call light within reach.

## 2022-12-09 NOTE — H&P (Signed)
History and Physical   TRAVARIS WIDRICK ZOX:[id number] DOB: [birth year] DOA: 12/09/2022  PCP: Margit Hanks, MD (Inactive)   Patient coming from: Home  Chief Complaint: Fall, found down  HPI: Bill Jackson is a 88 y.o. male with medical history significant of diabetes, stroke, syncope, hypertension, low back pain, glaucoma, neuropathy, GERD, history of gastrostomy presenting after fall at home.  History obtained with assistance of chart review and family.  Patient lives alone and has nursing visits several times a week (on Monday Wednesday Friday through a service provided by the [place]).  Reportedly eats a pured diet due to his history of stroke and previously required a feeding tube.   At some point overnight, patient believes around 10 PM but family thinks earlier due to being in his daytime close, patient fell on the floor and remained there until 12 PM today.  Family was unable to reach patient by phone and called EMS to check on patient.  He was found down but alert and awake.  No incontinence nor oral trauma noted.  He is complained of right rib pain and right abdominal pain.  His family feels that his speech may be more slurred than his baseline.  He denies fevers, chills, shortness of breath, abdominal pain, constipation, diarrhea, nausea, vomiting.  ED Course: Vital signs in the ED stable.  Lab workup notable for BMP showing sodium of 150, improved to 146 on subsequent labs.  Potassium 3.  Additional labs included creatinine elevated to 1.54, calcium 8.6.  CBC within normal limits.  Magnesium 1.4.  CK trend 1017, 1419.  Troponin elevated to 3200 with repeat pending.  Lactic acid pending.  Chest x-ray, pelvis x-ray, right knee x-ray, left knee x-ray, right elbow x-ray all without acute abnormality.  CT head without acute abnormality.  CT of the chest abdomen and pelvis noting only eighth rib fracture on the right, left upper lobe nodule of 8 mm, hepatic steatosis.  MR brain pending.   Patient received fentanyl, 500 cc IV fluids in the ED.  Also received aspirin and magnesium.  Cardiology consulted towards the end of his workup with troponin came back positive at 3200.  Review of Systems: As per HPI otherwise all other systems reviewed and are negative.  Past Medical History:  Diagnosis Date   Diabetes mellitus    Dysphagia following cerebrovascular accident    PEG tube   Hyperlipidemia    Hypertension    Left shoulder pain    Right knee pain    Stroke [facility]) march 2014   R side weakness, slurred speech, dizziness, visual changes, ataxia; hemorrhagic cerebellar CVA, evacuated    Past Surgical History:  Procedure Laterality Date   CARDIAC SURGERY     CRANIECTOMY N/A 10/25/2012   Procedure: Suboccipital Craniectomy for Evacuation of Cerebellar Hematoma;  Surgeon: Maeola Harman, MD;  Location: [facility];  Service: Neurosurgery;  Laterality: N/A;  Suboccipital Craniectomy for Evacuation of Cerebellar Hematoma   heart stents  x3   HERNIA REPAIR     PEG TUBE PLACEMENT     placed ?2014, changed 01/2014 (at [facility])   ROTATOR CUFF REPAIR      Social History  reports that he has been smoking. He has a 6.25 pack-year smoking history. He has never used smokeless tobacco. He reports that he does not drink alcohol and does not use drugs.  Allergies  Allergen Reactions   Lotensin [Benazepril Hcl] Cough   Penicillins Other (See Comments)    pt becomes "  nervous" Has patient had a PCN reaction causing immediate rash, facial/tongue/throat swelling, SOB or lightheadedness with hypotension:  Has patient had a PCN reaction causing severe rash involving mucus membranes or skin necrosis:  Has patient had a PCN reaction that required hospitalization  Has patient had a PCN reaction occurring within the last 10 years:  If all of the above answers are "NO", then may proceed with Cephalosporin use.     Family History  Family history unknown: Yes  Reviewed on admission.  Prior to  Admission medications   Medication Sig Start Date End Date Taking? Authorizing Provider  oxyCODONE-acetaminophen (PERCOCET/ROXICET) 5-325 MG tablet Take 1 tablet by mouth every 6 (six) hours as needed for severe pain. 12/09/22  Yes Eber Hong, MD  acetaminophen (TYLENOL) 325 MG tablet Take 650 mg by mouth every 6 (six) hours as needed for mild pain.    [provider]  brimonidine (ALPHAGAN) 0.2 % ophthalmic solution Place 1 drop into both eyes every 8 (eight) hours. 01/19/13   Lucrezia Starch, NP  dorzolamide-timolol (COSOPT) 22.3-6.8 MG/ML ophthalmic solution Place 1 drop into both eyes 2 (two) times daily.    [provider]  doxycycline (VIBRA-TABS) 100 MG tablet Take 1 tablet (100 mg total) by mouth every 12 (twelve) hours. 11/01/20   Simmons-Robinson, Makiera, MD  insulin aspart (NOVOLOG) 100 UNIT/ML injection Inject 2-10 Units into the skin 3 (three) times daily before meals. 8am, noon and 4pm. Sliding scale by the clinic    [provider]  insulin glargine (LANTUS) 100 UNIT/ML injection Inject 10-20 Units into the skin See admin instructions. If bs is  175 or below =10 units, if bs is 175 = 15 units. If bs is 200= 20 units    [provider]  latanoprost (XALATAN) 0.005 % ophthalmic solution Place 1 drop into both eyes at bedtime. 01/19/13   Lucrezia Starch, NP  loratadine (CLARITIN) 10 MG tablet Take 10 mg by mouth daily.    [provider]  losartan (COZAAR) 100 MG tablet Take 50 mg by mouth daily.    [provider]  meclizine (ANTIVERT) 25 MG tablet Take 1 tablet (25 mg total) by mouth 2 (two) times daily. 01/19/13   Lucrezia Starch, NP  metoCLOPramide (REGLAN) 10 MG tablet Take 1 tablet (10 mg total) by mouth every 6 (six) hours as needed (for residuals in feeding tube). 02/02/14   Shirleen Schirmer, PA-C  Multiple Vitamin (MULTIVITAMIN WITH MINERALS) TABS Take 1 tablet by mouth daily. 01/19/13   Lucrezia Starch, NP    Physical Exam: Vitals:    12/09/22 1237 12/09/22 1240 12/09/22 1245  BP:   132/84  Pulse:   98  Resp:   18  Temp:   98.1 F (36.7 C)  TempSrc:   Oral  SpO2: 95%  100%  Weight:  83.9 kg   Height:  6' (1.829 m)     Physical Exam Constitutional:      General: He is not in acute distress.    Appearance: Normal appearance.  HENT:     Head: Normocephalic and atraumatic.     Mouth/Throat:     Mouth: Mucous membranes are moist.     Pharynx: Oropharynx is clear.  Eyes:     Extraocular Movements: Extraocular movements intact.     Pupils: Pupils are equal, round, and reactive to light.  Cardiovascular:     Rate and Rhythm: Normal rate and regular rhythm.     Pulses: Normal pulses.  Heart sounds: Normal heart sounds.  Pulmonary:     Effort: Pulmonary effort is normal. No respiratory distress.     Breath sounds: Normal breath sounds.  Abdominal:     General: Bowel sounds are normal. There is no distension.     Palpations: Abdomen is soft.     Tenderness: There is no abdominal tenderness.  Musculoskeletal:        General: Tenderness (right chest wall) present. No swelling or deformity.  Skin:    General: Skin is warm and dry.  Neurological:     General: No focal deficit present.     Mental Status: Mental status is at baseline.    Labs on Admission: I have personally reviewed following labs and imaging studies  CBC: Recent Labs  Lab 12/09/22 1252 12/09/22 1256  WBC 7.7  --   NEUTROABS 6.0  --   HGB 13.9 13.6  HCT 40.7 40.0  MCV 100.2*  --   PLT 254  --     Basic Metabolic Panel: Recent Labs  Lab 12/09/22 1252 12/09/22 1256 12/09/22 1621  NA 150* 148* 146*  K 3.2* 3.2* 3.0*  CL 108 108 107  CO2 25  --  25  GLUCOSE 77 73 76  BUN 13 12 13   CREATININE 1.54* 1.40* 1.44*  CALCIUM 8.6*  --  8.2*  MG  --   --  1.4*    GFR: Estimated Creatinine Clearance: 40.4 mL/min (A) (by C-G formula based on SCr of 1.44 mg/dL (H)).  Liver Function Tests: No results for input(s): "AST", "ALT",  "ALKPHOS", "BILITOT", "PROT", "ALBUMIN" in the last 168 hours.  Urine analysis:    Component Value Date/Time   COLORURINE STRAW (A) 04/09/2018 1348   APPEARANCEUR CLEAR 04/09/2018 1348   LABSPEC 1.004 (L) 04/09/2018 1348   PHURINE 6.0 04/09/2018 1348   GLUCOSEU NEGATIVE 04/09/2018 1348   HGBUR NEGATIVE 04/09/2018 1348   BILIRUBINUR NEGATIVE 04/09/2018 1348   KETONESUR NEGATIVE 04/09/2018 1348   PROTEINUR NEGATIVE 04/09/2018 1348   UROBILINOGEN 1.0 10/21/2014 1059   NITRITE NEGATIVE 04/09/2018 1348   LEUKOCYTESUR NEGATIVE 04/09/2018 1348    Radiological Exams on Admission: DG Elbow 2 Views Right  Result Date: 12/09/2022 CLINICAL DATA:  Fall, patient rolled out of bed and was on the floor for unknown amount of time. EXAM: RIGHT ELBOW - 2 VIEW COMPARISON:  None Available. FINDINGS: There is no evidence of fracture, dislocation, or joint effusion. There is no evidence of arthropathy or other focal bone abnormality. Soft tissues are unremarkable. IMPRESSION: Negative. Electronically Signed   By: Larose Hires D.O.   On: 12/09/2022 17:34   DG Knee 2 Views Left  Result Date: 12/09/2022 CLINICAL DATA:  Fall patient rolled out of bed and was on the floor for unknown amount of time. EXAM: LEFT KNEE - 1-2 VIEW COMPARISON:  None Available. FINDINGS: No evidence of fracture, dislocation, or joint effusion. Tricompartmental knee joint space narrowing. Vascular calcifications. IMPRESSION: 1. No acute fracture or dislocation. 2. Tricompartmental knee joint space narrowing suggesting mild-to-moderate osteoarthritis. Electronically Signed   By: Larose Hires D.O.   On: 12/09/2022 17:32   DG Knee 2 Views Right  Result Date: 12/09/2022 CLINICAL DATA:  Fall.  Rolled out of bed and was on the floor EXAM: RIGHT KNEE - 1-2 VIEW COMPARISON:  None Available. FINDINGS: No evidence of fracture, dislocation, or joint effusion. Tricompartmental knee joint space narrowing. Soft tissues are unremarkable. IMPRESSION: 1. No  fracture or dislocation. 2. Mild knee osteoarthritis. Electronically  Signed   By: Larose Hires D.O.   On: 12/09/2022 17:31   CT Head Wo Contrast  Result Date: 12/09/2022 CLINICAL DATA:  Neuro deficit.  Acute stroke suspected. EXAM: CT HEAD WITHOUT CONTRAST TECHNIQUE: Contiguous axial images were obtained from the base of the skull through the vertex without intravenous contrast. RADIATION DOSE REDUCTION: This exam was performed according to the departmental dose-optimization program which includes automated exposure control, adjustment of the mA and/or kV according to patient size and/or use of iterative reconstruction technique. COMPARISON:  04/13/2022 FINDINGS: Brain: There is no evidence for acute hemorrhage, hydrocephalus, mass lesion, or abnormal extra-axial fluid collection. No definite CT evidence for acute infarction. Diffuse loss of parenchymal volume is consistent with atrophy. Patchy low attenuation in the deep hemispheric and periventricular white matter is nonspecific, but likely reflects chronic microvascular ischemic demyelination. Cerebellar encephalomalacia is stable with evidence of prior suboccipital craniectomy. Vascular: No hyperdense vessel or unexpected calcification. Skull: Suboccipital craniectomy defect evident. Sinuses/Orbits: The visualized paranasal sinuses and mastoid air cells are clear. Visualized portions of the globes and intraorbital fat are unremarkable. Other: None. IMPRESSION: 1. Stable.  No acute intracranial abnormality. 2. Atrophy with chronic small vessel ischemic disease. 3. Status post suboccipital craniectomy with stable cerebellar encephalomalacia. Electronically Signed   By: Kennith Center M.D.   On: 12/09/2022 17:06   CT CHEST ABDOMEN PELVIS W CONTRAST  Result Date: 12/09/2022 CLINICAL DATA:  Fall from bed, right-sided rib pain. EXAM: CT CHEST, ABDOMEN, AND PELVIS WITH CONTRAST TECHNIQUE: Multidetector CT imaging of the chest, abdomen and pelvis was performed  following the standard protocol during bolus administration of intravenous contrast. RADIATION DOSE REDUCTION: This exam was performed according to the departmental dose-optimization program which includes automated exposure control, adjustment of the mA and/or kV according to patient size and/or use of iterative reconstruction technique. CONTRAST:  75mL OMNIPAQUE IOHEXOL 350 MG/ML SOLN COMPARISON:  CT pelvis 10/30/2020, CT abdomen pelvis 09/07/2016. FINDINGS: CT CHEST FINDINGS Cardiovascular: Atherosclerotic calcification of the aorta, aortic valve and coronary arteries. Heart is at the upper limits of normal in size. No pericardial effusion. Mediastinum/Nodes: No pathologically enlarged mediastinal, hilar or axillary lymph nodes. Esophagus is grossly unremarkable. Lungs/Pleura: Image quality is degraded by respiratory motion. Mild dependent atelectasis bilaterally. 8 mm posterior left upper lobe nodule (5/46) with surrounding ground-glass. No pleural fluid. Airway is unremarkable. Musculoskeletal: Nondisplaced fracture of the posterolateral right eighth rib. CT ABDOMEN PELVIS FINDINGS Hepatobiliary: Liver is decreased in attenuation diffusely. Multiple hypoattenuating cysts in the liver. Liver and gallbladder are otherwise unremarkable. No biliary ductal dilatation. Pancreas: Negative. Spleen: Negative. Adrenals/Urinary Tract: Adrenal glands and kidneys are unremarkable. Ureters are decompressed. Right posterolateral bladder diverticulum. Bladder is otherwise grossly unremarkable. Stomach/Bowel: Stomach, small bowel, appendix and colon are unremarkable. Vascular/Lymphatic: Atherosclerotic calcification of the aorta. No pathologically enlarged lymph nodes. Reproductive: Prostate is visualized. Other: No free fluid.  Mesenteries and peritoneum are unremarkable. Musculoskeletal: Degenerative changes in the spine. Dextroconvex scoliosis. IMPRESSION: 1. Nondisplaced posterolateral right eighth rib fracture. No  additional evidence of acute trauma. 2. 8 mm posterior left upper lobe nodule, possibly infectious/inflammatory in etiology. Recommend follow-up CT chest without contrast in 4-6 weeks in further evaluation, as clinically indicated, as malignancy cannot be excluded. 3. Hepatic steatosis. 4. Aortic atherosclerosis (ICD10-I70.0). Coronary artery calcification. Electronically Signed   By: Leanna Battles M.D.   On: 12/09/2022 14:12   DG Chest Port 1 View  Result Date: 12/09/2022 CLINICAL DATA:  Trauma EXAM: PORTABLE CHEST 1 VIEW COMPARISON:  CXR 09/22/14  FINDINGS: No pleural effusion. No pneumothorax. No focal airspace opacity. Normal cardiac and mediastinal contours. No radiographically apparent displaced rib fractures. Visualized upper abdomen is unremarkable. IMPRESSION: No focal airspace opacity. Electronically Signed   By: Lorenza Cambridge M.D.   On: 12/09/2022 13:16   DG Pelvis Portable  Result Date: 12/09/2022 CLINICAL DATA:  Pain after trauma EXAM: PORTABLE PELVIS 1 VIEWS COMPARISON:  None Available. FINDINGS: Osteopenia. Hyperostosis. No fracture or dislocation. Preserved joint spaces of the hips and pubic symphysis. Mild degenerative changes of the sacroiliac joints. Vascular calcifications. Overlapping cardiac leads. With this level of osteopenia evaluation for subtle nondisplaced injury is difficult to completely exclude and if there is further concern, additional cross-sectional imaging as clinically appropriate IMPRESSION: Osteopenia.  Mild degenerative changes. Electronically Signed   By: Karen Kays M.D.   On: 12/09/2022 13:12    EKG: Independently reviewed.  Sinus rhythm at 95 beats minute.  Right bundle blanch block.  Nonspecific T wave flattening.  Assessment/Plan Principal Problem:   Fall Active Problems:   Type 2 diabetes mellitus (HCC)   Hypertension   Glaucoma   Low back pain   History of CVA (cerebrovascular accident)   Diabetic neuropathy (HCC)   Esophageal reflux   Elevated  troponin   Fall Rib fracture ?  Focal neurologic deficit, rule out repeat CVA > Patient fell out of bed versus before getting in bed yesterday evening. > Found down by EMS who was sent by his family when they were unable to reach by phone. > Appears to been down for at least 14 hours.  Initial [id number], repeat 1419. > Full imaging workup performed in the ED.  Right eighth rib fracture was only acute abnormality found. > 500 cc IV fluid in the ED. > Family feels as though his speech is more slurred than baseline. > With significant history of prior stroke MRI brain has been ordered in the ED to rule out recurrent stroke - Monitor on telemetry overnight - Continue with IV fluids - Follow-up MRI  Troponin elevation NSTEMI versus demand ischemia > Troponin elevated to 3200 with repeat pending. > Cardiology consulted. > Patient only reporting chest pain at the listed location of his right rib fracture.  > Unclear if traumatic versus NSTEMI versus other (could fall have been from arrhythmia?).  During exam, when I pressed on his right chest wall, in addition to his pain, on monitor he had a 6 or 7 beat run of V. tach. - Appreciate cardiology recommendations - Trend troponin - Monitor on telemetry as above  Hyponatremia Hypokalemia Hypomagnesemia ?Dehydration > Lab work showed initial sodium at 150 which has since improved to 146.  Potassium now 3.0.  Magnesium 1.4. > Creatinine mildly elevated at 1.54 initially now improved to 1.44 from baseline of 1.3.  Likely representing a degree of dehydration at this improved with half a liter bolus in the ED. > Did receive 2 g IV magnesium. - Monitor on telemetry as above - 60 mill equivalents oral potassium - Continue with IV fluids overnight - Trend renal function and electrolytes  History of CVA > History of stroke, previously requiring gastrostomy tube now is on pured diet with some soft foods.  No formal dysphagia diet recommendation known.   Appears to have exceptions as tolerated at home. > Stroke appears to have been cerebellar hemorrhage. > Currently reporting worsening slurred speech. - Follow-up repeat MRI as above - Dysphagia 1 diet for now - SLP eval and treat  Hypertension - Holding  home losartan in the setting of low normal blood pressure and awaiting results of MRI as above.  Glaucoma - Continue home eyedrops  DVT prophylaxis: Lovenox Code Status:   DNR, confirmed on admission Family Communication:  Updated at bedside Disposition Plan:   Patient is from:  Home  Anticipated DC to:  Pending clinical course  Anticipated DC date:  1 to 2 days  Anticipated DC barriers: If placement required  Consults called:  Cardiology called by EDP, awaiting recommendations Admission status:  Observation, telemetry  Severity of Illness: The appropriate patient status for this patient is OBSERVATION. Observation status is judged to be reasonable and necessary in order to provide the required intensity of service to ensure the patient's safety. The patient's presenting symptoms, physical exam findings, and initial radiographic and laboratory data in the context of their medical condition is felt to place them at decreased risk for further clinical deterioration. Furthermore, it is anticipated that the patient will be medically stable for discharge from the hospital within 2 midnights of admission.    Synetta Fail MD [facility]  How to contact the [facility] Attending or Consulting provider 7A - 7P or covering provider during after hours 7P -7A, for this patient?   Check the care team in [facility] and look for a) attending/consulting [facility] provider listed and b) the [facility] team listed Log into [email or web address] and use [place]'s universal password to access. If you do not have the password, please contact the hospital operator. Locate the [facility] provider you are looking for under [facility] and page to a number that you can be  directly reached. If you still have difficulty reaching the provider, please page the [facility] (Director on Call) for the Hospitalists listed on amion for assistance.  12/09/2022, 6:11 PM

## 2022-12-10 ENCOUNTER — Observation Stay (HOSPITAL_COMMUNITY): Payer: Medicare PPO

## 2022-12-10 ENCOUNTER — Encounter (HOSPITAL_COMMUNITY): Payer: Self-pay | Admitting: Internal Medicine

## 2022-12-10 DIAGNOSIS — I5041 Acute combined systolic (congestive) and diastolic (congestive) heart failure: Secondary | ICD-10-CM

## 2022-12-10 DIAGNOSIS — R7989 Other specified abnormal findings of blood chemistry: Secondary | ICD-10-CM | POA: Diagnosis not present

## 2022-12-10 DIAGNOSIS — I619 Nontraumatic intracerebral hemorrhage, unspecified: Secondary | ICD-10-CM

## 2022-12-10 DIAGNOSIS — S2231XA Fracture of one rib, right side, initial encounter for closed fracture: Secondary | ICD-10-CM

## 2022-12-10 DIAGNOSIS — I110 Hypertensive heart disease with heart failure: Secondary | ICD-10-CM | POA: Diagnosis not present

## 2022-12-10 DIAGNOSIS — I5020 Unspecified systolic (congestive) heart failure: Secondary | ICD-10-CM

## 2022-12-10 HISTORY — DX: Nontraumatic intracerebral hemorrhage, unspecified: I61.9

## 2022-12-10 LAB — URINALYSIS, W/ REFLEX TO CULTURE (INFECTION SUSPECTED)
Bacteria, UA: NONE SEEN
Glucose, UA: NEGATIVE mg/dL
Hgb urine dipstick: NEGATIVE
Ketones, ur: 20 mg/dL — AB
Leukocytes,Ua: NEGATIVE
Nitrite: NEGATIVE
Protein, ur: 30 mg/dL — AB
Specific Gravity, Urine: 1.035 — ABNORMAL HIGH (ref 1.005–1.030)
pH: 5 (ref 5.0–8.0)

## 2022-12-10 LAB — CBC
HCT: 38.1 % — ABNORMAL LOW (ref 39.0–52.0)
Hemoglobin: 13.2 g/dL (ref 13.0–17.0)
MCH: 35 pg — ABNORMAL HIGH (ref 26.0–34.0)
MCHC: 34.6 g/dL (ref 30.0–36.0)
MCV: 101.1 fL — ABNORMAL HIGH (ref 80.0–100.0)
Platelets: 235 10*3/uL (ref 150–400)
RBC: 3.77 MIL/uL — ABNORMAL LOW (ref 4.22–5.81)
RDW: 13.1 % (ref 11.5–15.5)
WBC: 6.7 10*3/uL (ref 4.0–10.5)
nRBC: 0.3 % — ABNORMAL HIGH (ref 0.0–0.2)

## 2022-12-10 LAB — COMPREHENSIVE METABOLIC PANEL
ALT: 23 U/L (ref 0–44)
AST: 64 U/L — ABNORMAL HIGH (ref 15–41)
Albumin: 2.6 g/dL — ABNORMAL LOW (ref 3.5–5.0)
Alkaline Phosphatase: 58 U/L (ref 38–126)
Anion gap: 13 (ref 5–15)
BUN: 16 mg/dL (ref 8–23)
CO2: 24 mmol/L (ref 22–32)
Calcium: 8.2 mg/dL — ABNORMAL LOW (ref 8.9–10.3)
Chloride: 107 mmol/L (ref 98–111)
Creatinine, Ser: 1.43 mg/dL — ABNORMAL HIGH (ref 0.61–1.24)
GFR, Estimated: 48 mL/min — ABNORMAL LOW (ref >60)
Glucose, Bld: 247 mg/dL — ABNORMAL HIGH (ref 70–99)
Potassium: 4.1 mmol/L (ref 3.5–5.1)
Sodium: 144 mmol/L (ref 135–145)
Total Bilirubin: 1.9 mg/dL — ABNORMAL HIGH (ref 0.3–1.2)
Total Protein: 5.6 g/dL — ABNORMAL LOW (ref 6.5–8.1)

## 2022-12-10 LAB — ECHOCARDIOGRAM COMPLETE
AR max vel: 2.11 cm2
AV Peak grad: 5 mmHg
Ao pk vel: 1.12 m/s
Area-P 1/2: 4.21 cm2
Est EF: 30
Height: 72 in
S' Lateral: 3.3 cm
Weight: 2960 oz

## 2022-12-10 LAB — VITAMIN B12: Vitamin B-12: 448 pg/mL (ref 180–914)

## 2022-12-10 LAB — LACTIC ACID, PLASMA: Lactic Acid, Venous: 2 mmol/L (ref 0.5–1.9)

## 2022-12-10 LAB — GLUCOSE, CAPILLARY
Glucose-Capillary: 182 mg/dL — ABNORMAL HIGH (ref 70–99)
Glucose-Capillary: 242 mg/dL — ABNORMAL HIGH (ref 70–99)
Glucose-Capillary: 377 mg/dL — ABNORMAL HIGH (ref 70–99)
Glucose-Capillary: 120 mg/dL — ABNORMAL HIGH (ref 70–99)

## 2022-12-10 LAB — HEMOGLOBIN A1C
Hgb A1c MFr Bld: 5.6 % (ref 4.8–5.6)
Mean Plasma Glucose: 114.02 mg/dL

## 2022-12-10 LAB — MAGNESIUM: Magnesium: 1.9 mg/dL (ref 1.7–2.4)

## 2022-12-10 LAB — TSH: TSH: 2.206 u[IU]/mL (ref 0.350–4.500)

## 2022-12-10 LAB — TROPONIN I (HIGH SENSITIVITY): Troponin I (High Sensitivity): 3125 ng/L (ref <18)

## 2022-12-10 LAB — CK: Total CK: 1570 U/L — ABNORMAL HIGH (ref 49–397)

## 2022-12-10 MED ORDER — FUROSEMIDE 10 MG/ML IJ SOLN
40.0000 mg | Freq: Once | INTRAMUSCULAR | Status: AC
  Administered 2022-12-10: 40 mg via INTRAVENOUS
  Filled 2022-12-10: qty 4

## 2022-12-10 MED ORDER — INSULIN ASPART 100 UNIT/ML IJ SOLN
0.0000 [IU] | Freq: Three times a day (TID) | INTRAMUSCULAR | Status: DC
  Administered 2022-12-10: 15 [IU] via SUBCUTANEOUS
  Administered 2022-12-11 (×2): 8 [IU] via SUBCUTANEOUS
  Administered 2022-12-11: 2 [IU] via SUBCUTANEOUS
  Administered 2022-12-12: 5 [IU] via SUBCUTANEOUS
  Administered 2022-12-13: 3 [IU] via SUBCUTANEOUS
  Administered 2022-12-13 – 2022-12-14 (×2): 2 [IU] via SUBCUTANEOUS
  Administered 2022-12-14: 3 [IU] via SUBCUTANEOUS
  Administered 2022-12-14: 5 [IU] via SUBCUTANEOUS
  Administered 2022-12-15 (×3): 3 [IU] via SUBCUTANEOUS
  Administered 2022-12-16: 2 [IU] via SUBCUTANEOUS
  Administered 2022-12-16: 3 [IU] via SUBCUTANEOUS

## 2022-12-10 MED ORDER — PERFLUTREN LIPID MICROSPHERE
1.0000 mL | INTRAVENOUS | Status: DC | PRN
  Administered 2022-12-10: 4 mL via INTRAVENOUS

## 2022-12-10 MED ORDER — AMIODARONE HCL IN DEXTROSE 360-4.14 MG/200ML-% IV SOLN
60.0000 mg/h | INTRAVENOUS | Status: AC
  Administered 2022-12-10 (×2): 60 mg/h via INTRAVENOUS
  Filled 2022-12-10 (×2): qty 200

## 2022-12-10 MED ORDER — CARVEDILOL 12.5 MG PO TABS
12.5000 mg | ORAL_TABLET | Freq: Two times a day (BID) | ORAL | Status: DC
  Administered 2022-12-11: 12.5 mg via ORAL
  Filled 2022-12-10: qty 1

## 2022-12-10 MED ORDER — AMLODIPINE BESYLATE 5 MG PO TABS: 5.0000 mg | ORAL_TABLET | Freq: Every day | ORAL | Status: DC

## 2022-12-10 MED ORDER — DIGOXIN 0.25 MG/ML IJ SOLN
0.1250 mg | Freq: Once | INTRAMUSCULAR | Status: AC
  Administered 2022-12-10: 0.125 mg via INTRAVENOUS
  Filled 2022-12-10: qty 0.5

## 2022-12-10 MED ORDER — AMIODARONE HCL IN DEXTROSE 360-4.14 MG/200ML-% IV SOLN
30.0000 mg/h | INTRAVENOUS | Status: DC
  Administered 2022-12-11 (×2): 30 mg/h via INTRAVENOUS
  Filled 2022-12-10 (×13): qty 200

## 2022-12-10 MED ORDER — GUAIFENESIN ER 600 MG PO TB12
1200.0000 mg | ORAL_TABLET | Freq: Two times a day (BID) | ORAL | Status: AC
  Administered 2022-12-10 – 2022-12-14 (×10): 1200 mg via ORAL
  Filled 2022-12-10 (×10): qty 2

## 2022-12-10 MED ORDER — SODIUM CHLORIDE 0.9 % IV SOLN: INTRAVENOUS | Status: AC

## 2022-12-10 MED ORDER — AMIODARONE LOAD VIA INFUSION
150.0000 mg | Freq: Once | INTRAVENOUS | Status: AC
  Administered 2022-12-10: 150 mg via INTRAVENOUS
  Filled 2022-12-10: qty 83.34

## 2022-12-10 MED ORDER — METOPROLOL TARTRATE 5 MG/5ML IV SOLN
5.0000 mg | Freq: Four times a day (QID) | INTRAVENOUS | Status: DC | PRN
  Filled 2022-12-10: qty 5

## 2022-12-10 NOTE — Progress Notes (Signed)
   12/10/22 1912  Vitals  Temp 98.3 F (36.8 C)  Temp Source Oral  BP (!) 105/92  MAP (mmHg) 98  BP Location Right Arm  BP Method Automatic  Patient Position (if appropriate) Lying  Pulse Rate (!) 142  Pulse Rate Source Monitor  ECG Heart Rate (!) 160  Resp 20  MEWS COLOR  MEWS Score Color Yellow  Oxygen Therapy  SpO2 93 %  O2 Device Room Air  MEWS Score  MEWS Temp 0  MEWS Systolic 0  MEWS Pulse 3  MEWS RR 0  MEWS LOC 0  MEWS Score 3  Provider Notification  Provider Name/Title Dr. Caleb Popp  Date Provider Notified 12/10/22  Time Provider Notified 1912  Method of Notification Page  Notification Reason Change in status  Provider response See new orders  Date of Provider Response 12/10/22  Time of Provider Response 1912

## 2022-12-10 NOTE — Progress Notes (Signed)
Echocardiogram 2D Echocardiogram has been performed.  Lucendia Herrlich 12/10/2022, 10:14 AM

## 2022-12-10 NOTE — Progress Notes (Addendum)
PROGRESS NOTE    MARC MIARS  ZOX:[id number] DOB: [birth year] DOA: 12/09/2022 PCP: Margit Hanks, MD (Inactive)   Brief Narrative: Bill Jackson is a 88 y.o. male with a history of stroke, diabetes, syncope, hypertension, low back pain, glaucoma, neuropathy, GERD.  Patient presents secondary to having a fall and being found down.  On admission he is found to have evidence of a right eighth rib fracture in addition to elevated troponin concern for possible NSTEMI.  Patient also was found to have multiple electrolyte abnormalities and was started on IV fluids in addition to electrolyte supplementation.  Cardiology was consulted for elevated troponin and ongoing evaluation.   Assessment and Plan:  Rib fracture Secondary to fall involving the right eighth rib.  Syncope and collapse Unclear etiology. Patient states he has been having falls since he had his stroke several years ago. He reports passing out, however. Unclear if this is cardiac related as patient was noted to have a short run of V-tach. -Continue telemetry -Transthoracic Echocardiogram  Elevated troponin In setting of prolonged time down. Troponin peak of 3,838. Patient without reports of substernal chest pain. No dyspnea. Transthoracic Echocardiogram ordered. Cardiology consulted. -Continue telemetry -Cardiology recommendations  Ventricular tachycardia Non-sustained. Noted on telemetry. Complicated by patient's history of fall and syncope. Potassium and magnesium low on admission and are improved. -Continue telemetry  Possible rhabdomyolysis CK increasing. Unclear if this is related to cardiac etiology vs traumatic rhabdomyolysis from fall and being down. Patient managed with IV fluids which are now discontinued secondary to developing pulmonary vascular congestion.  Confusion Possible delirium. Per sister, patient has had confusion for at least the last month. Current presentation is not acute. -Check urinalysis,  B1, B12, TSH  Hypernatremia Sodium of 150 on admission. Resolved with IV fluids.  Hypokalemia Potassium of 3.2 on admission with a low of 3.0. Improved to 4.1.  Diabetes mellitus, type II Patient is on Lantus and appears to be on a sliding scale of 10 to 20 units daily based on blood sugar.  Blood sugars uncontrolled this admission.  Unknown hemoglobin A1c. -Start sliding scale and likely start Semglee depending on blood sugar trend -Check hemoglobin A1c  Hypomagnesemia Magnesium of 1.4 on admission. Improved with repletion.  History of CVA Concern for acute stroke on admission. MRI ruled out acute stroke.   Primary hypertension Blood pressure uncontrolled. -Start amlodipine 5 mg daily  Glaucoma -Continue latanoprost, Cosopt and brimonidine  Left upper pulmonary nodule Noted on imaging. Possibly infectious/inflammatory measuring 8 mm. Recommendation for follow-up CT chest without contrast in 4-6 weeks  DVT prophylaxis: Lovenox Code Status:   Code Status: DNR Family Communication: Sister at bedside Disposition Plan: Discharge pending continued management of medical issues, cardiology recommendations   Consultants:  Cardiology  Procedures:  None  Antimicrobials: None    Subjective: Patient reports some right sided chest pain that he attributes to his rib fracture. No substernal chest pain or dyspnea.  Objective: BP (!) 127/94 (BP Location: Right Arm)   Pulse 76   Temp 98.1 F (36.7 C) (Oral)   Resp (!) 22   Ht 6' (1.829 m)   Wt 83.9 kg   SpO2 99%   BMI 25.09 kg/m   Examination:  General exam: Appears calm and comfortable Respiratory system: Clear to auscultation. Respiratory effort normal. Cardiovascular system: S1 & S2 heard, RRR. No murmurs. Gastrointestinal system: Abdomen is nondistended, soft and nontender. Normal bowel sounds heard. Central nervous system: Alert and oriented this morning. Became disoriented  in the afternoon. Dysarthria  noted. Musculoskeletal: No edema. No calf tenderness Skin: No cyanosis. No rashes Psychiatry: Judgement and insight appear normal. Mood & affect appropriate.    Data Reviewed: I have personally reviewed following labs and imaging studies  CBC Lab Results  Component Value Date   WBC 6.7 12/10/2022   RBC 3.77 (L) 12/10/2022   HGB 13.2 12/10/2022   HCT 38.1 (L) 12/10/2022   MCV 101.1 (H) 12/10/2022   MCH 35.0 (H) 12/10/2022   PLT 235 12/10/2022   MCHC 34.6 12/10/2022   RDW 13.1 12/10/2022   LYMPHSABS 0.8 12/09/2022   MONOABS 0.9 12/09/2022   EOSABS 0.0 12/09/2022   BASOSABS 0.0 12/09/2022     Last metabolic panel Lab Results  Component Value Date   NA 144 12/10/2022   K 4.1 12/10/2022   CL 107 12/10/2022   CO2 24 12/10/2022   BUN 16 12/10/2022   CREATININE 1.43 (H) 12/10/2022   GLUCOSE 247 (H) 12/10/2022   GFRNONAA 48 (L) 12/10/2022   GFRAA 56 (L) 04/09/2018   CALCIUM 8.2 (L) 12/10/2022   PROT 5.6 (L) 12/10/2022   ALBUMIN 2.6 (L) 12/10/2022   BILITOT 1.9 (H) 12/10/2022   ALKPHOS 58 12/10/2022   AST 64 (H) 12/10/2022   ALT 23 12/10/2022   ANIONGAP 13 12/10/2022    GFR: Estimated Creatinine Clearance: 40.7 mL/min (A) (by C-G formula based on SCr of 1.43 mg/dL (H)).  No results found for this or any previous visit (from the past 240 hour(s)).    Radiology Studies: MR BRAIN WO CONTRAST  Result Date: 12/09/2022 CLINICAL DATA:  Acute neurologic deficit EXAM: MRI HEAD WITHOUT CONTRAST TECHNIQUE: Multiplanar, multiecho pulse sequences of the brain and surrounding structures were obtained without intravenous contrast. COMPARISON:  04/19/2018 FINDINGS: Brain: No acute infarct, mass effect or extra-axial collection. Postsurgical changes of the cerebellum with encephalomalacia and right-sided siderosis. There is multifocal hyperintense T2-weighted signal within the white matter. Generalized volume loss. The midline structures are normal. Vascular: Major flow voids are  preserved. Skull and upper cervical spine: Normal calvarium and skull base. Visualized upper cervical spine and soft tissues are normal. Sinuses/Orbits:No paranasal sinus fluid levels or advanced mucosal thickening. No mastoid or middle ear effusion. Normal orbits. IMPRESSION: 1. No acute intracranial abnormality. 2. Postsurgical changes of the cerebellum with encephalomalacia and siderosis. 3. Chronic ischemic microangiopathy. Electronically Signed   By: Deatra Robinson M.D.   On: 12/09/2022 21:45   DG Elbow 2 Views Right  Result Date: 12/09/2022 CLINICAL DATA:  Fall, patient rolled out of bed and was on the floor for unknown amount of time. EXAM: RIGHT ELBOW - 2 VIEW COMPARISON:  None Available. FINDINGS: There is no evidence of fracture, dislocation, or joint effusion. There is no evidence of arthropathy or other focal bone abnormality. Soft tissues are unremarkable. IMPRESSION: Negative. Electronically Signed   By: Larose Hires D.O.   On: 12/09/2022 17:34   DG Knee 2 Views Left  Result Date: 12/09/2022 CLINICAL DATA:  Fall patient rolled out of bed and was on the floor for unknown amount of time. EXAM: LEFT KNEE - 1-2 VIEW COMPARISON:  None Available. FINDINGS: No evidence of fracture, dislocation, or joint effusion. Tricompartmental knee joint space narrowing. Vascular calcifications. IMPRESSION: 1. No acute fracture or dislocation. 2. Tricompartmental knee joint space narrowing suggesting mild-to-moderate osteoarthritis. Electronically Signed   By: Larose Hires D.O.   On: 12/09/2022 17:32   DG Knee 2 Views Right  Result Date: 12/09/2022 CLINICAL DATA:  Fall.  Rolled out of bed and was on the floor EXAM: RIGHT KNEE - 1-2 VIEW COMPARISON:  None Available. FINDINGS: No evidence of fracture, dislocation, or joint effusion. Tricompartmental knee joint space narrowing. Soft tissues are unremarkable. IMPRESSION: 1. No fracture or dislocation. 2. Mild knee osteoarthritis. Electronically Signed   By: Larose Hires D.O.   On: 12/09/2022 17:31   CT Head Wo Contrast  Result Date: 12/09/2022 CLINICAL DATA:  Neuro deficit.  Acute stroke suspected. EXAM: CT HEAD WITHOUT CONTRAST TECHNIQUE: Contiguous axial images were obtained from the base of the skull through the vertex without intravenous contrast. RADIATION DOSE REDUCTION: This exam was performed according to the departmental dose-optimization program which includes automated exposure control, adjustment of the mA and/or kV according to patient size and/or use of iterative reconstruction technique. COMPARISON:  04/13/2022 FINDINGS: Brain: There is no evidence for acute hemorrhage, hydrocephalus, mass lesion, or abnormal extra-axial fluid collection. No definite CT evidence for acute infarction. Diffuse loss of parenchymal volume is consistent with atrophy. Patchy low attenuation in the deep hemispheric and periventricular white matter is nonspecific, but likely reflects chronic microvascular ischemic demyelination. Cerebellar encephalomalacia is stable with evidence of prior suboccipital craniectomy. Vascular: No hyperdense vessel or unexpected calcification. Skull: Suboccipital craniectomy defect evident. Sinuses/Orbits: The visualized paranasal sinuses and mastoid air cells are clear. Visualized portions of the globes and intraorbital fat are unremarkable. Other: None. IMPRESSION: 1. Stable.  No acute intracranial abnormality. 2. Atrophy with chronic small vessel ischemic disease. 3. Status post suboccipital craniectomy with stable cerebellar encephalomalacia. Electronically Signed   By: Kennith Center M.D.   On: 12/09/2022 17:06   CT CHEST ABDOMEN PELVIS W CONTRAST  Result Date: 12/09/2022 CLINICAL DATA:  Fall from bed, right-sided rib pain. EXAM: CT CHEST, ABDOMEN, AND PELVIS WITH CONTRAST TECHNIQUE: Multidetector CT imaging of the chest, abdomen and pelvis was performed following the standard protocol during bolus administration of intravenous contrast.  RADIATION DOSE REDUCTION: This exam was performed according to the departmental dose-optimization program which includes automated exposure control, adjustment of the mA and/or kV according to patient size and/or use of iterative reconstruction technique. CONTRAST:  75mL OMNIPAQUE IOHEXOL 350 MG/ML SOLN COMPARISON:  CT pelvis 10/30/2020, CT abdomen pelvis 09/07/2016. FINDINGS: CT CHEST FINDINGS Cardiovascular: Atherosclerotic calcification of the aorta, aortic valve and coronary arteries. Heart is at the upper limits of normal in size. No pericardial effusion. Mediastinum/Nodes: No pathologically enlarged mediastinal, hilar or axillary lymph nodes. Esophagus is grossly unremarkable. Lungs/Pleura: Image quality is degraded by respiratory motion. Mild dependent atelectasis bilaterally. 8 mm posterior left upper lobe nodule (5/46) with surrounding ground-glass. No pleural fluid. Airway is unremarkable. Musculoskeletal: Nondisplaced fracture of the posterolateral right eighth rib. CT ABDOMEN PELVIS FINDINGS Hepatobiliary: Liver is decreased in attenuation diffusely. Multiple hypoattenuating cysts in the liver. Liver and gallbladder are otherwise unremarkable. No biliary ductal dilatation. Pancreas: Negative. Spleen: Negative. Adrenals/Urinary Tract: Adrenal glands and kidneys are unremarkable. Ureters are decompressed. Right posterolateral bladder diverticulum. Bladder is otherwise grossly unremarkable. Stomach/Bowel: Stomach, small bowel, appendix and colon are unremarkable. Vascular/Lymphatic: Atherosclerotic calcification of the aorta. No pathologically enlarged lymph nodes. Reproductive: Prostate is visualized. Other: No free fluid.  Mesenteries and peritoneum are unremarkable. Musculoskeletal: Degenerative changes in the spine. Dextroconvex scoliosis. IMPRESSION: 1. Nondisplaced posterolateral right eighth rib fracture. No additional evidence of acute trauma. 2. 8 mm posterior left upper lobe nodule, possibly  infectious/inflammatory in etiology. Recommend follow-up CT chest without contrast in 4-6 weeks in further evaluation, as clinically indicated, as malignancy  cannot be excluded. 3. Hepatic steatosis. 4. Aortic atherosclerosis (ICD10-I70.0). Coronary artery calcification. Electronically Signed   By: Leanna Battles M.D.   On: 12/09/2022 14:12   DG Chest Port 1 View  Result Date: 12/09/2022 CLINICAL DATA:  Trauma EXAM: PORTABLE CHEST 1 VIEW COMPARISON:  CXR 09/22/14 FINDINGS: No pleural effusion. No pneumothorax. No focal airspace opacity. Normal cardiac and mediastinal contours. No radiographically apparent displaced rib fractures. Visualized upper abdomen is unremarkable. IMPRESSION: No focal airspace opacity. Electronically Signed   By: Lorenza Cambridge M.D.   On: 12/09/2022 13:16   DG Pelvis Portable  Result Date: 12/09/2022 CLINICAL DATA:  Pain after trauma EXAM: PORTABLE PELVIS 1 VIEWS COMPARISON:  None Available. FINDINGS: Osteopenia. Hyperostosis. No fracture or dislocation. Preserved joint spaces of the hips and pubic symphysis. Mild degenerative changes of the sacroiliac joints. Vascular calcifications. Overlapping cardiac leads. With this level of osteopenia evaluation for subtle nondisplaced injury is difficult to completely exclude and if there is further concern, additional cross-sectional imaging as clinically appropriate IMPRESSION: Osteopenia.  Mild degenerative changes. Electronically Signed   By: Karen Kays M.D.   On: 12/09/2022 13:12      LOS: 0 days    Jacquelin Hawking, MD [facility] 12/10/2022, 9:50 AM   If 7PM-7AM, please contact night-coverage [email or web address]

## 2022-12-10 NOTE — Consult Note (Addendum)
Cardiology Consultation   Patient ID: DENO DILKS MRN: [id number]; DOB: 1935-04-17  Admit date: 12/09/2022 Date of Consult: 12/10/2022  PCP:  Margit Hanks, MD (Inactive)   [facility] Providers Cardiologist:  None   {  Patient Profile:   Bill Jackson is a 88 y.o. male with a hx of hypertension, type 2 diabetes mellitus, hemorrhagic stroke 2014, hyperlipidemia who is being seen 12/10/2022 for the evaluation of elevated troponins at the request of Dr. Caleb Popp.  History of Present Illness:   Mr. Traywick has no prior cardiac history.  Per prior notes, patient was admitted last night due to a fall that resulted in a rib fracture and had a down time of approximately 14-18hrs+ on the floor.  Cardiology has been consulted due to elevated troponins of [phone]   Exam was significantly limited by patient's altered mental status, confusion, and slurred speech (reported to be around baseline, however family thinks its gotten worse). Unsure of what patient's baseline mentation is however patient is clearly confused and thinks that he has seen me before, he is confused about the dates of the event, states he's been here in the hospital since Sunday, and unaware of what the year is or his birthday.  Patient also short of breath, only able to speak in 2-3 sentence bursts and has audible upper airway congestion.  During discussion, patient gives very conflicting information stating that he has never had chest pain before until this incident however then proceeded to say that he has had chest pain for years, but was unable to provide any further definition or details.  EKG showing normal sinus rhythm with a heart rate of 89, no significant ST-T wave changes.  Lactic acid 2, creatinine 1.43, GFR 48, potassium 3, magnesium 1.4. CK 1017.  MRI negative for stroke.  CT of the chest indicating eighth rib fracture on the right side. Past Medical History:  Diagnosis Date   Diabetes mellitus     Dysphagia following cerebrovascular accident    PEG tube   Hyperlipidemia    Hypertension    Left shoulder pain    Right knee pain    Stroke [facility]) march 2014   R side weakness, slurred speech, dizziness, visual changes, ataxia; hemorrhagic cerebellar CVA, evacuated    Past Surgical History:  Procedure Laterality Date   CARDIAC SURGERY     CRANIECTOMY N/A 10/25/2012   Procedure: Suboccipital Craniectomy for Evacuation of Cerebellar Hematoma;  Surgeon: Maeola Harman, MD;  Location: [facility];  Service: Neurosurgery;  Laterality: N/A;  Suboccipital Craniectomy for Evacuation of Cerebellar Hematoma   heart stents  x3   HERNIA REPAIR     PEG TUBE PLACEMENT     placed ?2014, changed 01/2014 (at [facility])   ROTATOR CUFF REPAIR      Inpatient Medications: Scheduled Meds:  aspirin  324 mg Oral Once   brimonidine  1 drop Both Eyes Q8H   dorzolamide-timolol  1 drop Both Eyes BID   enoxaparin (LOVENOX) injection  40 mg Subcutaneous Q24H   guaiFENesin  1,200 mg Oral BID   latanoprost  1 drop Both Eyes QHS   meclizine  25 mg Oral BID   potassium chloride  60 mEq Oral BID   sodium chloride flush  3 mL Intravenous Q12H   Continuous Infusions:   PRN Meds: acetaminophen **OR** acetaminophen, polyethylene glycol  Allergies:    Allergies  Allergen Reactions   Colestipol Other (See Comments)    Unknown reaction per [facility]  Ezetimibe Other (See Comments)    Unknown reaction per [facility]    Hydrocodone-Acetaminophen Other (See Comments)    Unknown reaction per [facility]    Lisinopril Cough   Lotensin [Benazepril Hcl] Cough    Not listed under allergies/reactions per [facility]   Lovastatin Other (See Comments)    Unknown reaction per [facility]  **Mevacor**   Penicillins Other (See Comments)    pt becomes "nervous" Has patient had a PCN reaction causing immediate rash, facial/tongue/throat swelling, SOB or lightheadedness with hypotension:  Has patient had a PCN reaction causing severe rash involving mucus  membranes or skin necrosis:  Has patient had a PCN reaction that required hospitalization  Has patient had a PCN reaction occurring within the last 10 years:  If all of the above answers are "NO", then may proceed with Cephalosporin use.    Simvastatin Other (See Comments)    Unknown reaction per [facility]     Social History:   Social History   Socioeconomic History   Marital status: Divorced    Spouse name: Not on file   Number of children: Not on file   Years of education: Not on file   Highest education level: Not on file  Occupational History   Not on file  Tobacco Use   Smoking status: Every Day    Packs/day: 0.25    Years: 25.00    Additional pack years: 0.00    Total pack years: 6.25    Types: Cigarettes    Last attempt to quit: 04/03/2013    Years since quitting: 9.6   Smokeless tobacco: Never  Substance and Sexual Activity   Alcohol use: No   Drug use: No   Sexual activity: Not on file  Other Topics Concern   Not on file  Social History Narrative   Not on file   Social Determinants of Health   Financial Resource Strain: Not on file  Food Insecurity: No Food Insecurity (12/09/2022)   Hunger Vital Sign    Worried About Running Out of Food in the Last Year: Never true    Ran Out of Food in the Last Year: Never true  Transportation Needs: No Transportation Needs (12/09/2022)   PRAPARE - Administrator, Civil Service (Medical): No    Lack of Transportation (Non-Medical): No  Physical Activity: Not on file  Stress: Not on file  Social Connections: Not on file  Intimate Partner Violence: Not At Risk (12/09/2022)   Humiliation, Afraid, Rape, and Kick questionnaire    Fear of Current or Ex-Partner: No    Emotionally Abused: No    Physically Abused: No    Sexually Abused: No    Family History:   Family History  Family history unknown: Yes     ROS:  Please see the history of present illness.  All other ROS reviewed and negative.     Physical  Exam/Data:   Vitals:   12/09/22 2259 12/10/22 0300 12/10/22 0749 12/10/22 1141  BP: 116/80 (!) 118/93 (!) 127/94 (!) 140/103  Pulse: 78 78 76 96  Resp: 20 20 (!) 22 19  Temp: 97.7 F (36.5 C) 98.2 F (36.8 C) 98.1 F (36.7 C) 98 F (36.7 C)  TempSrc: Oral Oral Oral Oral  SpO2: 94% 91% 99% 98%  Weight:      Height:       No intake or output data in the 24 hours ending 12/10/22 1215    12/09/2022   12:40 PM 04/13/2022   12:11  PM 10/30/2020   12:12 PM  Last 3 Weights  Weight (lbs) 185 lb 190 lb 190 lb  Weight (kg) 83.915 kg 86.183 kg 86.183 kg     Body mass index is 25.09 kg/m.  General: Obviously confused.  Not oriented to year or birthday. HEENT: normal Neck: no JVD Vascular: No carotid bruits; Distal pulses 2+ bilaterally Cardiac:  normal S1, S2; RRR; no murmur  Lungs: Rhonchi throughout, upper airway congestion  Abd: soft, nontender, no hepatomegaly  Ext: no edema Musculoskeletal:  No deformities, BUE and BLE strength normal and equal Skin: warm and dry  Neuro:  CNs 2-12 intact, no focal abnormalities noted Psych: Confused  EKG:  The EKG was personally reviewed and demonstrates: Normal sinus rhythm heart rate 89, no ST-T wave changes. Telemetry:  Telemetry was personally reviewed and demonstrates:  NSR   Relevant CV Studies:   Laboratory Data:  High Sensitivity Troponin:   Recent Labs  Lab 12/09/22 1621 12/09/22 1750 12/10/22 0853  TROPONINIHS 3,208* 3,838* 3,125*     Chemistry Recent Labs  Lab 12/09/22 1252 12/09/22 1256 12/09/22 1621 12/10/22 0056  NA 150* 148* 146* 144  K 3.2* 3.2* 3.0* 4.1  CL 108 108 107 107  CO2 25  --  25 24  GLUCOSE 77 73 76 247*  BUN 13 12 13 16   CREATININE 1.54* 1.40* 1.44* 1.43*  CALCIUM 8.6*  --  8.2* 8.2*  MG  --   --  1.4* 1.9  GFRNONAA 44*  --  47* 48*  ANIONGAP 17*  --  14 13    Recent Labs  Lab 12/10/22 0056  PROT 5.6*  ALBUMIN 2.6*  AST 64*  ALT 23  ALKPHOS 58  BILITOT 1.9*   Lipids No results for  input(s): "CHOL", "TRIG", "HDL", "LABVLDL", "LDLCALC", "CHOLHDL" in the last 168 hours.  Hematology Recent Labs  Lab 12/09/22 1252 12/09/22 1256 12/10/22 0056  WBC 7.7  --  6.7  RBC 4.06*  --  3.77*  HGB 13.9 13.6 13.2  HCT 40.7 40.0 38.1*  MCV 100.2*  --  101.1*  MCH 34.2*  --  35.0*  MCHC 34.2  --  34.6  RDW 12.7  --  13.1  PLT 254  --  235   Thyroid No results for input(s): "TSH", "FREET4" in the last 168 hours.  BNPNo results for input(s): "BNP", "PROBNP" in the last 168 hours.  DDimer No results for input(s): "DDIMER" in the last 168 hours.   Radiology/Studies:  MR BRAIN WO CONTRAST  Result Date: 12/09/2022 CLINICAL DATA:  Acute neurologic deficit EXAM: MRI HEAD WITHOUT CONTRAST TECHNIQUE: Multiplanar, multiecho pulse sequences of the brain and surrounding structures were obtained without intravenous contrast. COMPARISON:  04/19/2018 FINDINGS: Brain: No acute infarct, mass effect or extra-axial collection. Postsurgical changes of the cerebellum with encephalomalacia and right-sided siderosis. There is multifocal hyperintense T2-weighted signal within the white matter. Generalized volume loss. The midline structures are normal. Vascular: Major flow voids are preserved. Skull and upper cervical spine: Normal calvarium and skull base. Visualized upper cervical spine and soft tissues are normal. Sinuses/Orbits:No paranasal sinus fluid levels or advanced mucosal thickening. No mastoid or middle ear effusion. Normal orbits. IMPRESSION: 1. No acute intracranial abnormality. 2. Postsurgical changes of the cerebellum with encephalomalacia and siderosis. 3. Chronic ischemic microangiopathy. Electronically Signed   By: Deatra Robinson M.D.   On: 12/09/2022 21:45   DG Elbow 2 Views Right  Result Date: 12/09/2022 CLINICAL DATA:  Fall, patient rolled out of bed  and was on the floor for unknown amount of time. EXAM: RIGHT ELBOW - 2 VIEW COMPARISON:  None Available. FINDINGS: There is no evidence of  fracture, dislocation, or joint effusion. There is no evidence of arthropathy or other focal bone abnormality. Soft tissues are unremarkable. IMPRESSION: Negative. Electronically Signed   By: Larose Hires D.O.   On: 12/09/2022 17:34   DG Knee 2 Views Left  Result Date: 12/09/2022 CLINICAL DATA:  Fall patient rolled out of bed and was on the floor for unknown amount of time. EXAM: LEFT KNEE - 1-2 VIEW COMPARISON:  None Available. FINDINGS: No evidence of fracture, dislocation, or joint effusion. Tricompartmental knee joint space narrowing. Vascular calcifications. IMPRESSION: 1. No acute fracture or dislocation. 2. Tricompartmental knee joint space narrowing suggesting mild-to-moderate osteoarthritis. Electronically Signed   By: Larose Hires D.O.   On: 12/09/2022 17:32   DG Knee 2 Views Right  Result Date: 12/09/2022 CLINICAL DATA:  Fall.  Rolled out of bed and was on the floor EXAM: RIGHT KNEE - 1-2 VIEW COMPARISON:  None Available. FINDINGS: No evidence of fracture, dislocation, or joint effusion. Tricompartmental knee joint space narrowing. Soft tissues are unremarkable. IMPRESSION: 1. No fracture or dislocation. 2. Mild knee osteoarthritis. Electronically Signed   By: Larose Hires D.O.   On: 12/09/2022 17:31   CT Head Wo Contrast  Result Date: 12/09/2022 CLINICAL DATA:  Neuro deficit.  Acute stroke suspected. EXAM: CT HEAD WITHOUT CONTRAST TECHNIQUE: Contiguous axial images were obtained from the base of the skull through the vertex without intravenous contrast. RADIATION DOSE REDUCTION: This exam was performed according to the departmental dose-optimization program which includes automated exposure control, adjustment of the mA and/or kV according to patient size and/or use of iterative reconstruction technique. COMPARISON:  04/13/2022 FINDINGS: Brain: There is no evidence for acute hemorrhage, hydrocephalus, mass lesion, or abnormal extra-axial fluid collection. No definite CT evidence for acute  infarction. Diffuse loss of parenchymal volume is consistent with atrophy. Patchy low attenuation in the deep hemispheric and periventricular white matter is nonspecific, but likely reflects chronic microvascular ischemic demyelination. Cerebellar encephalomalacia is stable with evidence of prior suboccipital craniectomy. Vascular: No hyperdense vessel or unexpected calcification. Skull: Suboccipital craniectomy defect evident. Sinuses/Orbits: The visualized paranasal sinuses and mastoid air cells are clear. Visualized portions of the globes and intraorbital fat are unremarkable. Other: None. IMPRESSION: 1. Stable.  No acute intracranial abnormality. 2. Atrophy with chronic small vessel ischemic disease. 3. Status post suboccipital craniectomy with stable cerebellar encephalomalacia. Electronically Signed   By: Kennith Center M.D.   On: 12/09/2022 17:06   CT CHEST ABDOMEN PELVIS W CONTRAST  Result Date: 12/09/2022 CLINICAL DATA:  Fall from bed, right-sided rib pain. EXAM: CT CHEST, ABDOMEN, AND PELVIS WITH CONTRAST TECHNIQUE: Multidetector CT imaging of the chest, abdomen and pelvis was performed following the standard protocol during bolus administration of intravenous contrast. RADIATION DOSE REDUCTION: This exam was performed according to the departmental dose-optimization program which includes automated exposure control, adjustment of the mA and/or kV according to patient size and/or use of iterative reconstruction technique. CONTRAST:  75mL OMNIPAQUE IOHEXOL 350 MG/ML SOLN COMPARISON:  CT pelvis 10/30/2020, CT abdomen pelvis 09/07/2016. FINDINGS: CT CHEST FINDINGS Cardiovascular: Atherosclerotic calcification of the aorta, aortic valve and coronary arteries. Heart is at the upper limits of normal in size. No pericardial effusion. Mediastinum/Nodes: No pathologically enlarged mediastinal, hilar or axillary lymph nodes. Esophagus is grossly unremarkable. Lungs/Pleura: Image quality is degraded by respiratory  motion. Mild dependent atelectasis bilaterally.  8 mm posterior left upper lobe nodule (5/46) with surrounding ground-glass. No pleural fluid. Airway is unremarkable. Musculoskeletal: Nondisplaced fracture of the posterolateral right eighth rib. CT ABDOMEN PELVIS FINDINGS Hepatobiliary: Liver is decreased in attenuation diffusely. Multiple hypoattenuating cysts in the liver. Liver and gallbladder are otherwise unremarkable. No biliary ductal dilatation. Pancreas: Negative. Spleen: Negative. Adrenals/Urinary Tract: Adrenal glands and kidneys are unremarkable. Ureters are decompressed. Right posterolateral bladder diverticulum. Bladder is otherwise grossly unremarkable. Stomach/Bowel: Stomach, small bowel, appendix and colon are unremarkable. Vascular/Lymphatic: Atherosclerotic calcification of the aorta. No pathologically enlarged lymph nodes. Reproductive: Prostate is visualized. Other: No free fluid.  Mesenteries and peritoneum are unremarkable. Musculoskeletal: Degenerative changes in the spine. Dextroconvex scoliosis. IMPRESSION: 1. Nondisplaced posterolateral right eighth rib fracture. No additional evidence of acute trauma. 2. 8 mm posterior left upper lobe nodule, possibly infectious/inflammatory in etiology. Recommend follow-up CT chest without contrast in 4-6 weeks in further evaluation, as clinically indicated, as malignancy cannot be excluded. 3. Hepatic steatosis. 4. Aortic atherosclerosis (ICD10-I70.0). Coronary artery calcification. Electronically Signed   By: Leanna Battles M.D.   On: 12/09/2022 14:12   DG Chest Port 1 View  Result Date: 12/09/2022 CLINICAL DATA:  Trauma EXAM: PORTABLE CHEST 1 VIEW COMPARISON:  CXR 09/22/14 FINDINGS: No pleural effusion. No pneumothorax. No focal airspace opacity. Normal cardiac and mediastinal contours. No radiographically apparent displaced rib fractures. Visualized upper abdomen is unremarkable. IMPRESSION: No focal airspace opacity. Electronically Signed   By:  Lorenza Cambridge M.D.   On: 12/09/2022 13:16   DG Pelvis Portable  Result Date: 12/09/2022 CLINICAL DATA:  Pain after trauma EXAM: PORTABLE PELVIS 1 VIEWS COMPARISON:  None Available. FINDINGS: Osteopenia. Hyperostosis. No fracture or dislocation. Preserved joint spaces of the hips and pubic symphysis. Mild degenerative changes of the sacroiliac joints. Vascular calcifications. Overlapping cardiac leads. With this level of osteopenia evaluation for subtle nondisplaced injury is difficult to completely exclude and if there is further concern, additional cross-sectional imaging as clinically appropriate IMPRESSION: Osteopenia.  Mild degenerative changes. Electronically Signed   By: Karen Kays M.D.   On: 12/09/2022 13:12     Assessment and Plan:   Altered mental status Patient clearly confused and is not oriented to current events, year, birthday, and unable to give accurate history making medical decision making difficult. Unsure what his baseline function, is but discussed and made aware to primary team if need for further workup.  Elevated troponin [phone]  Rhabdo vs stress cardiomypathy vs NSTEMI Patient admitted for fall and rib fracture with a prolonged downtime.  Troponins have been elevated, and patient complaining of right-sided chest pain on the side of rib fracture.  EKG not showing any acute ischemic changes. Given patients AMS, it is unclear the etiology of his fall, if he has had prior chest pain. His HPI does seem to be suggestive of rhabdo given elevated CK, lactate levels, and AKI. However, preliminary review of echo shows hypokinesis of the septal wall making stress cardiomyopathy also possible vs infarct. Additionally, patient did have coronary calcifications on his CT. Currently, patient would not be a good candidate for cath right now due to rib fracture and AMS. Echo still pending. Will discuss with MD about treatment plan. Patient has history of hemorrhagic stroke and would not  be a candidate for heparin.  Aspirin has not been given due to inability to swallow properly  If reduced EF will try to optimize GDMT.  Need to be cautious about infusion of fluids given pulmonary congestion. Diuresis would also  be difficult given possible rhabdo and elevated lactate levels.   Fall Still of unknown etiology, consider cardiac monitor at discharge  Hypertension  Generally well controlled during this admission. PTA meds held due to pending MRI results, but has AKI now and would recommend continuing to hold.   Hx of hemorrhagic stroke with residual dysarthria (neg MRI) Hyponatremia Hypokalemia Hypomagnesemia Per primary team     Risk Assessment/Risk Scores:   TIMI Risk Score for Unstable Angina or Non-ST Elevation MI:   The patient's TIMI risk score is 3, which indicates a 13% risk of all cause mortality, new or recurrent myocardial infarction or need for urgent revascularization in the next 14 days.{  For questions or updates, please contact [facility] Please consult [email or web address] for contact info under    Signed, Abagail Kitchens, PA-C  12/10/2022 12:15 PM

## 2022-12-10 NOTE — Inpatient Diabetes Management (Signed)
Inpatient Diabetes Program Recommendations  AACE/ADA: New Consensus Statement on Inpatient Glycemic Control (2015)  Target Ranges:  Prepandial:   less than 140 mg/dL      Peak postprandial:   less than 180 mg/dL (1-2 hours)      Critically ill patients:  140 - 180 mg/dL   Lab Results  Component Value Date   GLUCAP 242 (H) 12/10/2022   HGBA1C 6.2 (H) 06/11/2013    Review of Glycemic Control  Latest Reference Range & Units 12/10/22 08:03 12/10/22 11:39  Glucose-Capillary 70 - 99 mg/dL 161 (H) 096 (H)   Diabetes history: DM  Outpatient Diabetes medications:  Lantus 10-20 units daily (If CBG is 175 or below=10 units, If CBG is >175 but less than 200=15 units, if >200- 20 units) Current orders for Inpatient glycemic control:  None  Inpatient Diabetes Program Recommendations:    Consider adding Novolog 0-6 units tid with meals for correction.   Thanks  Beryl Meager, RN, BC-ADM Inpatient Diabetes Coordinator Pager [phone]  (8a-5p)

## 2022-12-10 NOTE — TOC CAGE-AID Note (Signed)
Transition of Care [facility]) - CAGE-AID Screening   [name] MRN: [id number] Date of Birth: 07-01-1935  Transition of Care [facility]) CM/SW Contact:    Katha Hamming, RN Phone [phone] 12/10/2022, 8:21 PM   CAGE-AID Screening: Substance Abuse Screening unable to be completed due to: : Patient unable to participate (pt confused this shift, unable to screen)

## 2022-12-10 NOTE — Progress Notes (Signed)
Pt remains confused, irritable, pulling lines and trying to get out of bed. MD notified and sitter order received. Pt threatening to hit staff. Pt reoriented to room and surroundings, safety sitter at bedside. Pt's CBG up to 377 and MD notified. New orders received. RN will continue to closely monitor pt. Dionne Bucy RN

## 2022-12-10 NOTE — Progress Notes (Signed)
Pt remain confused and agitated, HR up in the 160's to 180's and noted to be Afib on the monitor, Dr Caleb Popp was notified and EKG order received. EKG shows Afib RVR, placed in pt's chart and MD notified. New order for metoprolol received and MD asked RN to notify cardiology team. Dr. Wyline Mood in Cardiology notified. Reported off to oncoming RN and PA from cardiology at bedside. Dionne Bucy RN

## 2022-12-10 NOTE — Hospital Course (Addendum)
Bill Jackson is a 88 y.o. male with a history of stroke, diabetes, syncope, hypertension, low back pain, glaucoma, neuropathy, GERD.  Patient presents secondary to having a fall and being found down.  On admission he is found to have evidence of a right eighth rib fracture in addition to elevated troponin concern for possible NSTEMI.  Patient also was found to have multiple electrolyte abnormalities and was started on IV fluids in addition to electrolyte supplementation.  Cardiology was consulted for elevated troponin and ongoing evaluation.  Echocardiogram revealed reduced LVEF of 30% with cardiology assessment of Takotsubo cardiomyopathy and associated demand ischemia.  During admission, patient developed transition of rhythm to atrial fibrillation with associated rapid ventricular rate.  Rate controlled eventually with metoprolol, digoxin, amiodarone.  Patient converted back to sinus rhythm.  Hospitalization also complicated by confusion/delirium.

## 2022-12-10 NOTE — Evaluation (Signed)
Clinical/Bedside Swallow Evaluation [name] MRN: [id number] Date of Birth: 10-Aug-1934  Today's Date: 12/10/2022 Time: SLP Start Time (ACUTE ONLY): 1340 SLP Stop Time (ACUTE ONLY): 1400 SLP Time Calculation (min) (ACUTE ONLY): 20 min  Past Medical History:  Past Medical History:  Diagnosis Date   Diabetes mellitus    Dysphagia following cerebrovascular accident    PEG tube   Hyperlipidemia    Hypertension    Left shoulder pain    Right knee pain    Stroke [facility]) march 2014   R side weakness, slurred speech, dizziness, visual changes, ataxia; hemorrhagic cerebellar CVA, evacuated   Past Surgical History:  Past Surgical History:  Procedure Laterality Date   CARDIAC SURGERY     CRANIECTOMY N/A 10/25/2012   Procedure: Suboccipital Craniectomy for Evacuation of Cerebellar Hematoma;  Surgeon: Maeola Harman, MD;  Location: [facility];  Service: Neurosurgery;  Laterality: N/A;  Suboccipital Craniectomy for Evacuation of Cerebellar Hematoma   heart stents  x3   HERNIA REPAIR     PEG TUBE PLACEMENT     placed ?2014, changed 01/2014 (at [facility])   ROTATOR CUFF REPAIR     HPI:  Patient is an 88 y.o. male with PMH: DM, CVA (with h/o dysphagia requiring modified diet and PEG), syncope, HTN, GERD, neuropathy He presented to the hospital on 12/09/22 following fall at home. (patient lives by himself but with nursing visits several times a week) Family was unable to reach him on the phone and they called EMS. In ED, patient's vitals were stable, CT head without acute intracranial abnormality, T of the chest abdomen and pelvis noting only eighth rib fracture on the right, left upper lobe nodule of 8 mm, hepatic steatosis. SLP swallow evaluation ordered secondary to observed coughing with PO's by nursing.    Assessment / Plan / Recommendation  Clinical Impression  Patient presents with clinical s/s of dysphagia as per this bedside swallow evaluation. Per his daughter he coughs  "sometimes" at home and has been on a pureed solids, thin liquids diet since his CVA. SLP assessed patient's swallow with honey thick, nectar thick, thin liquids and puree solids. He exhibited immediate coughing with thin and nectar thick liquids but no overt s/s aspiration or penetration with puree solids or honey thick liquids. SLP is recommending downgrade liquids to honey thick and plan for objective swallow study prior to advancing. Daughter in room is in agreement with this plan. SLP Visit Diagnosis: Dysphagia, unspecified (R13.10)    Aspiration Risk  Severe aspiration risk;Moderate aspiration risk    Diet Recommendation Honey-thick liquid;Dysphagia 1 (Puree)   Liquid Administration via: Cup Medication Administration: Whole meds with liquid Supervision: Patient able to self feed;Full supervision/cueing for compensatory strategies Compensations: Small sips/bites;Slow rate Postural Changes: Seated upright at 90 degrees    Other  Recommendations Oral Care Recommendations: Oral care BID    Recommendations for follow up therapy are one component of a multi-disciplinary discharge planning process, led by the attending physician.  Recommendations may be updated based on patient status, additional functional criteria and insurance authorization.  Follow up Recommendations Follow physician's recommendations for discharge plan and follow up therapies      Assistance Recommended at Discharge    Functional Status Assessment Patient has had a recent decline in their functional status and demonstrates the ability to make significant improvements in function in a reasonable and predictable amount of time.  Frequency and Duration min 2x/week  2 weeks  Prognosis Prognosis for improved oropharyngeal function: Fair Barriers to Reach Goals: Time post onset;Severity of deficits      Swallow Study   General Date of Onset: 12/09/22 HPI: Patient is an 88 y.o. male with PMH: DM, CVA (with h/o  dysphagia requiring modified diet and PEG), syncope, HTN, GERD, neuropathy He presented to the hospital on 12/09/22 following fall at home. (patient lives by himself but with nursing visits several times a week) Family was unable to reach him on the phone and they called EMS. In ED, patient's vitals were stable, CT head without acute intracranial abnormality, T of the chest abdomen and pelvis noting only eighth rib fracture on the right, left upper lobe nodule of 8 mm, hepatic steatosis. SLP swallow evaluation ordered secondary to observed coughing with PO's by nursing. Type of Study: Bedside Swallow Evaluation Previous Swallow Assessment: was in [facility] in 2014 following CVA Diet Prior to this Study: Dysphagia 1 (pureed);Thin liquids (Level 0) Temperature Spikes Noted: No Respiratory Status: Room air History of Recent Intubation: No Behavior/Cognition: Alert;Cooperative;Pleasant mood Oral Cavity Assessment: Within Functional Limits Oral Care Completed by SLP: No Oral Cavity - Dentition: Adequate natural dentition Vision: Functional for self-feeding Self-Feeding Abilities: Able to feed self;Needs assist Patient Positioning: Upright in bed Baseline Vocal Quality: Low vocal intensity Volitional Cough: Congested Volitional Swallow: Able to elicit    Oral/Motor/Sensory Function Overall Oral Motor/Sensory Function: Within functional limits   Ice Chips     Thin Liquid Thin Liquid: Impaired Presentation: Straw Pharyngeal  Phase Impairments: Suspected delayed Swallow;Decreased hyoid-laryngeal movement;Cough - Immediate    Nectar Thick Nectar Thick Liquid: Impaired Presentation: Straw Pharyngeal Phase Impairments: Suspected delayed Swallow;Decreased hyoid-laryngeal movement;Cough - Immediate   Honey Thick Honey Thick Liquid: Impaired Presentation: Cup Pharyngeal Phase Impairments: Suspected delayed Swallow   Puree Puree: Impaired Presentation: Self Fed;Spoon Pharyngeal Phase Impairments: Suspected  delayed Swallow   Solid     Solid: Not tested     Angela Nevin, MA, CCC-SLP Speech Therapy

## 2022-12-10 NOTE — Progress Notes (Addendum)
Called to bedside for a change in rhythm. Telemetry reviewed, appears to have converted to Afib with RVR initially in the 180s, now 130-160s. He is hypotensive with SBP dropping from 140s to 97 systolic. I discussed with his daughter at bedside the need to control his rate/rhythm given his hypotension and that we are unable to use BB/CCB. I will start amiodarone bolus and infusion along with 0.125 mg IV digoxin - confirmed with pharmacy this dose given his renal function.   I opted not to heparinize given his hx of hemorrhagic stroke. I discussed risk of stroke with his daughter at bedside if we chemically convert him - we do have an onset of Afib on telemetry, but unclear etiology of his fall. She confirms she is not aware of any history of Afib/arrhythmia.  Given that his pressure continues to drift down, I have ordered gentle fluids to help support him. He opens his eyes and responds with very light stimulation, remains confused, is not in distress.  May need lasix later this evening. Per pharmacy, may receive another dose of IV digoxin in 6 hrs if needed.  Case reviewed with Dr. Tereso Newcomer.

## 2022-12-11 ENCOUNTER — Observation Stay (HOSPITAL_COMMUNITY): Payer: Medicare PPO

## 2022-12-11 ENCOUNTER — Inpatient Hospital Stay (HOSPITAL_COMMUNITY): Payer: Medicare PPO

## 2022-12-11 DIAGNOSIS — W19XXXA Unspecified fall, initial encounter: Secondary | ICD-10-CM | POA: Diagnosis not present

## 2022-12-11 DIAGNOSIS — N179 Acute kidney failure, unspecified: Secondary | ICD-10-CM | POA: Diagnosis present

## 2022-12-11 DIAGNOSIS — I69391 Dysphagia following cerebral infarction: Secondary | ICD-10-CM | POA: Diagnosis not present

## 2022-12-11 DIAGNOSIS — I959 Hypotension, unspecified: Secondary | ICD-10-CM | POA: Diagnosis not present

## 2022-12-11 DIAGNOSIS — Y92003 Bedroom of unspecified non-institutional (private) residence as the place of occurrence of the external cause: Secondary | ICD-10-CM | POA: Diagnosis not present

## 2022-12-11 DIAGNOSIS — I110 Hypertensive heart disease with heart failure: Secondary | ICD-10-CM | POA: Diagnosis present

## 2022-12-11 DIAGNOSIS — I5020 Unspecified systolic (congestive) heart failure: Secondary | ICD-10-CM

## 2022-12-11 DIAGNOSIS — S2231XA Fracture of one rib, right side, initial encounter for closed fracture: Secondary | ICD-10-CM | POA: Diagnosis not present

## 2022-12-11 DIAGNOSIS — Z794 Long term (current) use of insulin: Secondary | ICD-10-CM | POA: Diagnosis not present

## 2022-12-11 DIAGNOSIS — K219 Gastro-esophageal reflux disease without esophagitis: Secondary | ICD-10-CM | POA: Diagnosis not present

## 2022-12-11 DIAGNOSIS — G9341 Metabolic encephalopathy: Secondary | ICD-10-CM | POA: Diagnosis present

## 2022-12-11 DIAGNOSIS — E785 Hyperlipidemia, unspecified: Secondary | ICD-10-CM | POA: Diagnosis present

## 2022-12-11 DIAGNOSIS — E870 Hyperosmolality and hypernatremia: Secondary | ICD-10-CM | POA: Diagnosis present

## 2022-12-11 DIAGNOSIS — Z66 Do not resuscitate: Secondary | ICD-10-CM | POA: Diagnosis present

## 2022-12-11 DIAGNOSIS — E871 Hypo-osmolality and hyponatremia: Secondary | ICD-10-CM | POA: Diagnosis present

## 2022-12-11 DIAGNOSIS — E1140 Type 2 diabetes mellitus with diabetic neuropathy, unspecified: Secondary | ICD-10-CM | POA: Diagnosis present

## 2022-12-11 DIAGNOSIS — M6282 Rhabdomyolysis: Secondary | ICD-10-CM | POA: Diagnosis present

## 2022-12-11 DIAGNOSIS — R7989 Other specified abnormal findings of blood chemistry: Secondary | ICD-10-CM | POA: Diagnosis not present

## 2022-12-11 DIAGNOSIS — I480 Paroxysmal atrial fibrillation: Secondary | ICD-10-CM | POA: Diagnosis present

## 2022-12-11 DIAGNOSIS — I214 Non-ST elevation (NSTEMI) myocardial infarction: Secondary | ICD-10-CM | POA: Diagnosis present

## 2022-12-11 DIAGNOSIS — E876 Hypokalemia: Secondary | ICD-10-CM | POA: Diagnosis present

## 2022-12-11 DIAGNOSIS — R0781 Pleurodynia: Secondary | ICD-10-CM | POA: Diagnosis present

## 2022-12-11 DIAGNOSIS — Z8673 Personal history of transient ischemic attack (TIA), and cerebral infarction without residual deficits: Secondary | ICD-10-CM | POA: Diagnosis not present

## 2022-12-11 DIAGNOSIS — K760 Fatty (change of) liver, not elsewhere classified: Secondary | ICD-10-CM | POA: Diagnosis present

## 2022-12-11 DIAGNOSIS — I472 Ventricular tachycardia, unspecified: Secondary | ICD-10-CM | POA: Diagnosis present

## 2022-12-11 DIAGNOSIS — Z79899 Other long term (current) drug therapy: Secondary | ICD-10-CM | POA: Diagnosis not present

## 2022-12-11 DIAGNOSIS — W06XXXA Fall from bed, initial encounter: Secondary | ICD-10-CM | POA: Diagnosis present

## 2022-12-11 DIAGNOSIS — I5041 Acute combined systolic (congestive) and diastolic (congestive) heart failure: Secondary | ICD-10-CM | POA: Diagnosis present

## 2022-12-11 DIAGNOSIS — Z931 Gastrostomy status: Secondary | ICD-10-CM | POA: Diagnosis not present

## 2022-12-11 DIAGNOSIS — H409 Unspecified glaucoma: Secondary | ICD-10-CM | POA: Diagnosis present

## 2022-12-11 DIAGNOSIS — E860 Dehydration: Secondary | ICD-10-CM | POA: Diagnosis present

## 2022-12-11 LAB — CK TOTAL AND CKMB (NOT AT ARMC)
CK, MB: 10.1 ng/mL — ABNORMAL HIGH (ref 0.5–5.0)
Total CK: 969 U/L — ABNORMAL HIGH (ref 49–397)

## 2022-12-11 LAB — GLUCOSE, CAPILLARY
Glucose-Capillary: 261 mg/dL — ABNORMAL HIGH (ref 70–99)
Glucose-Capillary: 264 mg/dL — ABNORMAL HIGH (ref 70–99)
Glucose-Capillary: 198 mg/dL — ABNORMAL HIGH (ref 70–99)
Glucose-Capillary: 144 mg/dL — ABNORMAL HIGH (ref 70–99)
Glucose-Capillary: 49 mg/dL — ABNORMAL LOW (ref 70–99)
Glucose-Capillary: 68 mg/dL — ABNORMAL LOW (ref 70–99)
Glucose-Capillary: 95 mg/dL (ref 70–99)

## 2022-12-11 LAB — MAGNESIUM: Magnesium: 1.6 mg/dL — ABNORMAL LOW (ref 1.7–2.4)

## 2022-12-11 LAB — PHOSPHORUS: Phosphorus: 1.9 mg/dL — ABNORMAL LOW (ref 2.5–4.6)

## 2022-12-11 LAB — COMPREHENSIVE METABOLIC PANEL
ALT: 25 U/L (ref 0–44)
AST: 56 U/L — ABNORMAL HIGH (ref 15–41)
Albumin: 2.5 g/dL — ABNORMAL LOW (ref 3.5–5.0)
Alkaline Phosphatase: 58 U/L (ref 38–126)
Anion gap: 12 (ref 5–15)
BUN: 22 mg/dL (ref 8–23)
CO2: 25 mmol/L (ref 22–32)
Calcium: 8.4 mg/dL — ABNORMAL LOW (ref 8.9–10.3)
Chloride: 110 mmol/L (ref 98–111)
Creatinine, Ser: 1.51 mg/dL — ABNORMAL HIGH (ref 0.61–1.24)
GFR, Estimated: 45 mL/min — ABNORMAL LOW (ref >60)
Glucose, Bld: 242 mg/dL — ABNORMAL HIGH (ref 70–99)
Potassium: 5 mmol/L (ref 3.5–5.1)
Sodium: 147 mmol/L — ABNORMAL HIGH (ref 135–145)
Total Bilirubin: 1 mg/dL (ref 0.3–1.2)
Total Protein: 6 g/dL — ABNORMAL LOW (ref 6.5–8.1)

## 2022-12-11 LAB — SODIUM: Sodium: 147 mmol/L — ABNORMAL HIGH (ref 135–145)

## 2022-12-11 MED ORDER — INSULIN GLARGINE-YFGN 100 UNIT/ML ~~LOC~~ SOLN
10.0000 [IU] | Freq: Every day | SUBCUTANEOUS | Status: DC
  Administered 2022-12-11 – 2022-12-16 (×6): 10 [IU] via SUBCUTANEOUS
  Filled 2022-12-11 (×6): qty 0.1

## 2022-12-11 MED ORDER — AMIODARONE HCL 200 MG PO TABS: 200.0000 mg | ORAL_TABLET | Freq: Every day | ORAL | Status: DC

## 2022-12-11 MED ORDER — CARVEDILOL 6.25 MG PO TABS
6.2500 mg | ORAL_TABLET | Freq: Two times a day (BID) | ORAL | Status: DC
  Administered 2022-12-11 – 2022-12-16 (×8): 6.25 mg via ORAL
  Filled 2022-12-11 (×10): qty 1

## 2022-12-11 MED ORDER — DEXTROSE-NACL 5-0.45 % IV SOLN: INTRAVENOUS | Status: AC

## 2022-12-11 MED ORDER — AMIODARONE HCL 200 MG PO TABS
200.0000 mg | ORAL_TABLET | Freq: Two times a day (BID) | ORAL | Status: DC
  Administered 2022-12-11 – 2022-12-16 (×11): 200 mg via ORAL
  Filled 2022-12-11 (×11): qty 1

## 2022-12-11 NOTE — Progress Notes (Signed)
PROGRESS NOTE    Bill Jackson  ZOX:[id number] DOB: April 10, 1935 DOA: 12/09/2022 PCP: Margit Hanks, MD (Inactive)   Brief Narrative: Bill Jackson is a 88 y.o. male with a history of stroke, diabetes, syncope, hypertension, low back pain, glaucoma, neuropathy, GERD.  Patient presents secondary to having a fall and being found down.  On admission he is found to have evidence of a right eighth rib fracture in addition to elevated troponin concern for possible NSTEMI.  Patient also was found to have multiple electrolyte abnormalities and was started on IV fluids in addition to electrolyte supplementation.  Cardiology was consulted for elevated troponin and ongoing evaluation.  Echocardiogram revealed reduced LVEF of 30% with cardiology assessment of Takotsubo cardiomyopathy and associated demand ischemia.  During admission, patient developed transition of rhythm to atrial fibrillation with associated rapid ventricular rate.  Rate controlled eventually with metoprolol, digoxin, amiodarone.  Patient converted back to sinus rhythm.  Hospitalization also complicated by confusion/delirium.   Assessment and Plan:  Rib fracture Secondary to fall involving the right eighth rib. -Pain management  Syncope and collapse Unclear etiology. Patient states he has been having falls since he had his stroke several years ago. He reports passing out, however. Unclear if this is cardiac related as patient was noted to have a short run of V-tach. Transthoracic Echocardiogram significant for reduced LVEF of 30%. Patient has now developed atrial fibrillation. -Continue telemetry  Elevated troponin In setting of prolonged time down. Troponin peak of 3,838. Patient without reports of substernal chest pain. No dyspnea. Transthoracic Echocardiogram ordered. Cardiology consulted. -Continue telemetry -Cardiology recommendations: demand ischemia, no ischemic workup  Acute combined systolic and diastolic heart  failure Transthoracic Echocardiogram significant for an LVEF of 30%. Cardiology concerned for Takotsubo cardiomyopathy. Secondary to behavioral concerns, ischemic workup is deferred and recommendation for ongoing medical management. Coreg started. Plan for ARB/Entresto and spironolactone as renal function allows.  Non-sustained bentricular tachycardia Non-sustained. Noted on telemetry. Complicated by patient's history of fall and syncope. Potassium and magnesium low on admission and are improved. -Continue telemetry  Paroxysmal atrial fibrillation with RVR Patient converted to atrial fibrillation with associated rapid heart rate on 5/9. Metoprolol given with resultant soft blood pressure. Cardiology started amiodarone and gave one dose of digoxin. Patient has converted back into sinus rhythm. Amiodarone continued.  Possible rhabdomyolysis CK increasing. Unclear if this is related to cardiac etiology vs traumatic rhabdomyolysis from fall and being down. Patient managed with IV fluids which are now discontinued secondary to developing pulmonary vascular congestion. CK trending down.  Confusion Possible delirium. Per sister, patient has had confusion for at least the last month. Current presentation is not acute. Urinalysis not suggestive of infection. TSH normal. Vitamin B12 normal. Confusion is improved this morning, but if he has underling cognitive impairment with sundowning, possible he will have recurrent episodes. -Delirium precautions  Hypernatremia Sodium of 150 on admission. Resolved with IV fluids but has worsened again -Restart fluids with D5 1/2 NS at 50 mL/hr x10 hours  Hypokalemia Potassium of 3.2 on admission with a low of 3.0. Improved to 4.1 and now up to 5.0. -Discontinue potassium supplementation  Diabetes mellitus, type II Patient is on Lantus and appears to be on a sliding scale of 10 to 20 units daily based on blood sugar.  Blood sugars uncontrolled this admission.  Hemoglobin A1c of 5.6%. -Continue sliding scale -Start Semglee 10 units daily  Hypomagnesemia Magnesium of 1.4 on admission. Improved with repletion.  History of CVA Concern  for acute stroke on admission. MRI ruled out acute stroke.   Primary hypertension Blood pressure uncontrolled.  Glaucoma -Continue latanoprost, Cosopt and brimonidine  Left upper pulmonary nodule Noted on imaging. Possibly infectious/inflammatory measuring 8 mm. Recommendation for follow-up CT chest without contrast in 4-6 weeks  DVT prophylaxis: Lovenox Code Status:   Code Status: DNR Family Communication: None at bedside. Sister via telephone, but went to voicemail. Disposition Plan: Discharge pending continued management of medical issues, cardiology recommendations   Consultants:  Cardiology  Procedures:  5/9: Transthoracic Echocardiogram   Antimicrobials: None    Subjective: Patient reports no concerns today. He states he will not eat his breakfast because he has been trying to lose weight.  Objective: BP 109/77 (BP Location: Right Arm)   Pulse 74   Temp 97.9 F (36.6 C) (Oral)   Resp (!) 23   Ht 6' (1.829 m)   Wt 83.9 kg   SpO2 98%   BMI 25.09 kg/m   Examination:  General exam: Appears calm and comfortable Respiratory system: Clear to auscultation. Respiratory effort normal. Cardiovascular system: S1 & S2 heard, RRR. Gastrointestinal system: Abdomen is nondistended, soft and nontender. Normal bowel sounds heard. Central nervous system: Alert and oriented to person and place. Dysarthria. Musculoskeletal: No edema. No calf tenderness Skin: No cyanosis. No rashes   Data Reviewed: I have personally reviewed following labs and imaging studies  CBC Lab Results  Component Value Date   WBC 6.7 12/10/2022   RBC 3.77 (L) 12/10/2022   HGB 13.2 12/10/2022   HCT 38.1 (L) 12/10/2022   MCV 101.1 (H) 12/10/2022   MCH 35.0 (H) 12/10/2022   PLT 235 12/10/2022   MCHC 34.6 12/10/2022    RDW 13.1 12/10/2022   LYMPHSABS 0.8 12/09/2022   MONOABS 0.9 12/09/2022   EOSABS 0.0 12/09/2022   BASOSABS 0.0 12/09/2022     Last metabolic panel Lab Results  Component Value Date   NA 147 (H) 12/11/2022   K 5.0 12/11/2022   CL 110 12/11/2022   CO2 25 12/11/2022   BUN 22 12/11/2022   CREATININE 1.51 (H) 12/11/2022   GLUCOSE 242 (H) 12/11/2022   GFRNONAA 45 (L) 12/11/2022   GFRAA 56 (L) 04/09/2018   CALCIUM 8.4 (L) 12/11/2022   PROT 6.0 (L) 12/11/2022   ALBUMIN 2.5 (L) 12/11/2022   BILITOT 1.0 12/11/2022   ALKPHOS 58 12/11/2022   AST 56 (H) 12/11/2022   ALT 25 12/11/2022   ANIONGAP 12 12/11/2022    GFR: Estimated Creatinine Clearance: 38.5 mL/min (A) (by C-G formula based on SCr of 1.51 mg/dL (H)).  No results found for this or any previous visit (from the past 240 hour(s)).    Radiology Studies: ECHOCARDIOGRAM COMPLETE  Result Date: 12/10/2022    ECHOCARDIOGRAM REPORT   Patient Name:   Bill Jackson Date of Exam: 12/10/2022 Medical Rec #:  [id number]      Height:       72.0 in Accession #:    [id number]     Weight:       185.0 lb Date of Birth:  1934/12/16      BSA:          2.061 m Patient Age:    88 years       BP:           127/94 mmHg Patient Gender: M              HR:  84 bpm. Exam Location:  Inpatient Procedure: 2D Echo, Cardiac Doppler, Color Doppler and Intracardiac            Opacification Agent Indications:    Elevated Troponin  History:        Patient has no prior history of Echocardiogram examinations.                 Risk Factors:Hypertension and Diabetes.  Sonographer:    Lucendia Herrlich Referring Phys: [phone] Shanika Levings A Jenny Omdahl IMPRESSIONS  1. Left ventricular ejection fraction, by estimation, is 30%. The left ventricle has moderately decreased function. The left ventricle demonstrates regional wall motion abnormalities (see scoring diagram/findings for description). Left ventricular diastolic parameters are consistent with Grade I diastolic dysfunction  (impaired relaxation).  2. Slow flowing apical contrast without discrete thrombus.  3. Right ventricular systolic function is normal. The right ventricular size is normal. There is normal pulmonary artery systolic pressure. The estimated right ventricular systolic pressure is 34.2 mmHg.  4. The mitral valve is grossly normal. Trivial mitral valve regurgitation.  5. The aortic valve is grossly normal. Aortic valve regurgitation is trivial. No aortic stenosis is present.  6. The inferior vena cava is normal in size with <50% respiratory variability, suggesting right atrial pressure of 8 mmHg. FINDINGS  Left Ventricle: Left ventricular ejection fraction, by estimation, is 30%. The left ventricle has moderately decreased function. The left ventricle demonstrates regional wall motion abnormalities. Definity contrast agent was given IV to delineate the left ventricular endocardial borders. The left ventricular internal cavity size was normal in size. There is no left ventricular hypertrophy. Left ventricular diastolic parameters are consistent with Grade I diastolic dysfunction (impaired relaxation).  LV Wall Scoring: The mid and distal anterior wall, mid and distal lateral wall, mid and distal anterior septum, mid and distal inferior wall, and mid inferoseptal segment are akinetic. The mid anterolateral segment is hypokinetic. Right Ventricle: The right ventricular size is normal. No increase in right ventricular wall thickness. Right ventricular systolic function is normal. There is normal pulmonary artery systolic pressure. The tricuspid regurgitant velocity is 2.56 m/s, and  with an assumed right atrial pressure of 8 mmHg, the estimated right ventricular systolic pressure is 34.2 mmHg. Left Atrium: Left atrial size was normal in size. Right Atrium: Right atrial size was normal in size. Pericardium: There is no evidence of pericardial effusion. Mitral Valve: The mitral valve is grossly normal. Trivial mitral valve  regurgitation. Tricuspid Valve: The tricuspid valve is grossly normal. Tricuspid valve regurgitation is mild. Aortic Valve: The aortic valve is grossly normal. Aortic valve regurgitation is trivial. No aortic stenosis is present. Aortic valve peak gradient measures 5.0 mmHg. Pulmonic Valve: The pulmonic valve was grossly normal. Pulmonic valve regurgitation is trivial. Aorta: The aortic root is normal in size and structure. Venous: The inferior vena cava is normal in size with less than 50% respiratory variability, suggesting right atrial pressure of 8 mmHg. IAS/Shunts: The interatrial septum was not well visualized.  LEFT VENTRICLE PLAX 2D LVIDd:         4.60 cm   Diastology LVIDs:         3.30 cm   LV e' medial:    4.04 cm/s LV PW:         1.00 cm   LV E/e' medial:  9.5 LV IVS:        0.90 cm   LV e' lateral:   4.97 cm/s LVOT diam:     2.10 cm   LV  E/e' lateral: 7.7 LV SV:         39 LV SV Index:   19 LVOT Area:     3.46 cm  IVC IVC diam: 1.90 cm LEFT ATRIUM           Index        RIGHT ATRIUM           Index LA diam:      2.30 cm 1.12 cm/m   RA Area:     14.20 cm LA Vol (A4C): 47.5 ml 23.05 ml/m  RA Volume:   36.00 ml  17.47 ml/m  AORTIC VALVE                 PULMONIC VALVE AV Area (Vmax): 2.11 cm     PR End Diast Vel: 9.86 msec AV Vmax:        112.00 cm/s AV Peak Grad:   5.0 mmHg LVOT Vmax:      68.23 cm/s LVOT Vmean:     40.900 cm/s LVOT VTI:       0.112 m  AORTA Ao Root diam: 3.50 cm Ao Asc diam:  3.20 cm MITRAL VALVE               TRICUSPID VALVE MV Area (PHT): 4.21 cm    TR Peak grad:   26.2 mmHg MV Decel Time: 180 msec    TR Vmax:        256.00 cm/s MV E velocity: 38.30 cm/s MV A velocity: 77.10 cm/s  SHUNTS MV E/A ratio:  0.50        Systemic VTI:  0.11 m                            Systemic Diam: 2.10 cm Bill Brass MD Electronically signed by Bill Brass MD Signature Date/Time: 12/10/2022/2:26:28 PM    Final    DG CHEST PORT 1 VIEW  Result Date: 12/10/2022 CLINICAL DATA:  Rales EXAM:  PORTABLE CHEST 1 VIEW COMPARISON:  X-ray 12/09/2022 FINDINGS: Stable cardiopericardial silhouette. Film is rotated to the left. Tortuous aorta. Increasing vascular congestion and trace edema, right-greater-than-left. No pneumothorax or effusion. No consolidation. Overlapping cardiac leads. Recommend follow-up IMPRESSION: Increasing vascular congestion and trace edema. Electronically Signed   By: Karen Kays M.D.   On: 12/10/2022 14:20   MR BRAIN WO CONTRAST  Result Date: 12/09/2022 CLINICAL DATA:  Acute neurologic deficit EXAM: MRI HEAD WITHOUT CONTRAST TECHNIQUE: Multiplanar, multiecho pulse sequences of the brain and surrounding structures were obtained without intravenous contrast. COMPARISON:  04/19/2018 FINDINGS: Brain: No acute infarct, mass effect or extra-axial collection. Postsurgical changes of the cerebellum with encephalomalacia and right-sided siderosis. There is multifocal hyperintense T2-weighted signal within the white matter. Generalized volume loss. The midline structures are normal. Vascular: Major flow voids are preserved. Skull and upper cervical spine: Normal calvarium and skull base. Visualized upper cervical spine and soft tissues are normal. Sinuses/Orbits:No paranasal sinus fluid levels or advanced mucosal thickening. No mastoid or middle ear effusion. Normal orbits. IMPRESSION: 1. No acute intracranial abnormality. 2. Postsurgical changes of the cerebellum with encephalomalacia and siderosis. 3. Chronic ischemic microangiopathy. Electronically Signed   By: Deatra Robinson M.D.   On: 12/09/2022 21:45   DG Elbow 2 Views Right  Result Date: 12/09/2022 CLINICAL DATA:  Fall, patient rolled out of bed and was on the floor for unknown amount of time. EXAM: RIGHT ELBOW - 2 VIEW COMPARISON:  None Available. FINDINGS:  There is no evidence of fracture, dislocation, or joint effusion. There is no evidence of arthropathy or other focal bone abnormality. Soft tissues are unremarkable. IMPRESSION:  Negative. Electronically Signed   By: Larose Hires D.O.   On: 12/09/2022 17:34   DG Knee 2 Views Left  Result Date: 12/09/2022 CLINICAL DATA:  Fall patient rolled out of bed and was on the floor for unknown amount of time. EXAM: LEFT KNEE - 1-2 VIEW COMPARISON:  None Available. FINDINGS: No evidence of fracture, dislocation, or joint effusion. Tricompartmental knee joint space narrowing. Vascular calcifications. IMPRESSION: 1. No acute fracture or dislocation. 2. Tricompartmental knee joint space narrowing suggesting mild-to-moderate osteoarthritis. Electronically Signed   By: Larose Hires D.O.   On: 12/09/2022 17:32   DG Knee 2 Views Right  Result Date: 12/09/2022 CLINICAL DATA:  Fall.  Rolled out of bed and was on the floor EXAM: RIGHT KNEE - 1-2 VIEW COMPARISON:  None Available. FINDINGS: No evidence of fracture, dislocation, or joint effusion. Tricompartmental knee joint space narrowing. Soft tissues are unremarkable. IMPRESSION: 1. No fracture or dislocation. 2. Mild knee osteoarthritis. Electronically Signed   By: Larose Hires D.O.   On: 12/09/2022 17:31   CT Head Wo Contrast  Result Date: 12/09/2022 CLINICAL DATA:  Neuro deficit.  Acute stroke suspected. EXAM: CT HEAD WITHOUT CONTRAST TECHNIQUE: Contiguous axial images were obtained from the base of the skull through the vertex without intravenous contrast. RADIATION DOSE REDUCTION: This exam was performed according to the departmental dose-optimization program which includes automated exposure control, adjustment of the mA and/or kV according to patient size and/or use of iterative reconstruction technique. COMPARISON:  04/13/2022 FINDINGS: Brain: There is no evidence for acute hemorrhage, hydrocephalus, mass lesion, or abnormal extra-axial fluid collection. No definite CT evidence for acute infarction. Diffuse loss of parenchymal volume is consistent with atrophy. Patchy low attenuation in the deep hemispheric and periventricular white matter is  nonspecific, but likely reflects chronic microvascular ischemic demyelination. Cerebellar encephalomalacia is stable with evidence of prior suboccipital craniectomy. Vascular: No hyperdense vessel or unexpected calcification. Skull: Suboccipital craniectomy defect evident. Sinuses/Orbits: The visualized paranasal sinuses and mastoid air cells are clear. Visualized portions of the globes and intraorbital fat are unremarkable. Other: None. IMPRESSION: 1. Stable.  No acute intracranial abnormality. 2. Atrophy with chronic small vessel ischemic disease. 3. Status post suboccipital craniectomy with stable cerebellar encephalomalacia. Electronically Signed   By: Kennith Center M.D.   On: 12/09/2022 17:06   CT CHEST ABDOMEN PELVIS W CONTRAST  Result Date: 12/09/2022 CLINICAL DATA:  Fall from bed, right-sided rib pain. EXAM: CT CHEST, ABDOMEN, AND PELVIS WITH CONTRAST TECHNIQUE: Multidetector CT imaging of the chest, abdomen and pelvis was performed following the standard protocol during bolus administration of intravenous contrast. RADIATION DOSE REDUCTION: This exam was performed according to the departmental dose-optimization program which includes automated exposure control, adjustment of the mA and/or kV according to patient size and/or use of iterative reconstruction technique. CONTRAST:  75mL OMNIPAQUE IOHEXOL 350 MG/ML SOLN COMPARISON:  CT pelvis 10/30/2020, CT abdomen pelvis 09/07/2016. FINDINGS: CT CHEST FINDINGS Cardiovascular: Atherosclerotic calcification of the aorta, aortic valve and coronary arteries. Heart is at the upper limits of normal in size. No pericardial effusion. Mediastinum/Nodes: No pathologically enlarged mediastinal, hilar or axillary lymph nodes. Esophagus is grossly unremarkable. Lungs/Pleura: Image quality is degraded by respiratory motion. Mild dependent atelectasis bilaterally. 8 mm posterior left upper lobe nodule (5/46) with surrounding ground-glass. No pleural fluid. Airway is  unremarkable. Musculoskeletal: Nondisplaced fracture of  the posterolateral right eighth rib. CT ABDOMEN PELVIS FINDINGS Hepatobiliary: Liver is decreased in attenuation diffusely. Multiple hypoattenuating cysts in the liver. Liver and gallbladder are otherwise unremarkable. No biliary ductal dilatation. Pancreas: Negative. Spleen: Negative. Adrenals/Urinary Tract: Adrenal glands and kidneys are unremarkable. Ureters are decompressed. Right posterolateral bladder diverticulum. Bladder is otherwise grossly unremarkable. Stomach/Bowel: Stomach, small bowel, appendix and colon are unremarkable. Vascular/Lymphatic: Atherosclerotic calcification of the aorta. No pathologically enlarged lymph nodes. Reproductive: Prostate is visualized. Other: No free fluid.  Mesenteries and peritoneum are unremarkable. Musculoskeletal: Degenerative changes in the spine. Dextroconvex scoliosis. IMPRESSION: 1. Nondisplaced posterolateral right eighth rib fracture. No additional evidence of acute trauma. 2. 8 mm posterior left upper lobe nodule, possibly infectious/inflammatory in etiology. Recommend follow-up CT chest without contrast in 4-6 weeks in further evaluation, as clinically indicated, as malignancy cannot be excluded. 3. Hepatic steatosis. 4. Aortic atherosclerosis (ICD10-I70.0). Coronary artery calcification. Electronically Signed   By: Leanna Battles M.D.   On: 12/09/2022 14:12   DG Chest Port 1 View  Result Date: 12/09/2022 CLINICAL DATA:  Trauma EXAM: PORTABLE CHEST 1 VIEW COMPARISON:  CXR 09/22/14 FINDINGS: No pleural effusion. No pneumothorax. No focal airspace opacity. Normal cardiac and mediastinal contours. No radiographically apparent displaced rib fractures. Visualized upper abdomen is unremarkable. IMPRESSION: No focal airspace opacity. Electronically Signed   By: Lorenza Cambridge M.D.   On: 12/09/2022 13:16   DG Pelvis Portable  Result Date: 12/09/2022 CLINICAL DATA:  Pain after trauma EXAM: PORTABLE PELVIS 1 VIEWS  COMPARISON:  None Available. FINDINGS: Osteopenia. Hyperostosis. No fracture or dislocation. Preserved joint spaces of the hips and pubic symphysis. Mild degenerative changes of the sacroiliac joints. Vascular calcifications. Overlapping cardiac leads. With this level of osteopenia evaluation for subtle nondisplaced injury is difficult to completely exclude and if there is further concern, additional cross-sectional imaging as clinically appropriate IMPRESSION: Osteopenia.  Mild degenerative changes. Electronically Signed   By: Karen Kays M.D.   On: 12/09/2022 13:12      LOS: 0 days    Jacquelin Hawking, MD [facility] 12/11/2022, 7:45 AM   If 7PM-7AM, please contact night-coverage [email or web address]

## 2022-12-11 NOTE — Progress Notes (Signed)
Modified Barium Swallow Study  [name] MRN: [id number] Date of Birth: 1935-01-16  Today's Date: 12/11/2022  Modified Barium Swallow completed.  Full report located under Chart Review in the Imaging Section.  History of Present Illness Patient is an 88 y.o. male with PMH: DM, CVA (with h/o dysphagia requiring modified diet and PEG), syncope, HTN, GERD, neuropathy He presented to the hospital on 12/09/22 following fall at home. (patient lives by himself but with nursing visits several times a week) Family was unable to reach him on the phone and they called EMS. In ED, patient's vitals were stable, CT head without acute intracranial abnormality, T of the chest abdomen and pelvis noting only eighth rib fracture on the right, left upper lobe nodule of 8 mm, hepatic steatosis. SLP swallow evaluation ordered secondary to observed coughing with PO's by nursing.   Clinical Impression Pt demonstrates impaired swallowing with sensed, moderately significant aspiration events of large nectar and thin sips before the swallow. When thin or nectar sips are smaller and controlled pt has instances of trace aspiration just below the vocal folds with ejection. A chin tuck with large sips also reduced severity of aspiration to trace quantities that are ejected. Puree and mechanical soft solids are tolerated with minimal pharyngeal residue. Pt does not appear to have significant weakness but there was inconsistent collections of mild residue in sulci. For now, pt to continue dys 2/honey thick, but would like therapists that f/u to engage pt and family in discussion about acceptance of risk with thin liquids and strategies that may reduce risk when possible. Pt says he hates honey thick liquids and is likely not consuming them at home anyway. Factors that may increase risk of adverse event in presence of aspiration Rubye Oaks & Clearance Coots 2021): Reduced cognitive function  Swallow Evaluation  Recommendations Recommendations: PO diet PO Diet Recommendation: Moderately thick liquids (Level 3, honey thick);Dysphagia 2 (Finely chopped) Medication Administration: Whole meds with puree Supervision: Staff to assist with self-feeding Swallowing strategies  : Slow rate;Small bites/sips      Lazarius Rivkin, Riley Nearing 12/11/2022,2:28 PM

## 2022-12-11 NOTE — Progress Notes (Signed)
Rounding Note    Patient Name: Bill Jackson Date of Encounter: 12/11/2022  [facility] Cardiologist: None   Subjective   Feeling OK.  No Cp/SOB  Inpatient Medications    Scheduled Meds:  aspirin  324 mg Oral Once   brimonidine  1 drop Both Eyes Q8H   carvedilol  12.5 mg Oral BID WC   dorzolamide-timolol  1 drop Both Eyes BID   enoxaparin (LOVENOX) injection  40 mg Subcutaneous Q24H   guaiFENesin  1,200 mg Oral BID   insulin aspart  0-15 Units Subcutaneous TID WC   insulin glargine-yfgn  10 Units Subcutaneous Daily   latanoprost  1 drop Both Eyes QHS   sodium chloride flush  3 mL Intravenous Q12H   Continuous Infusions:  amiodarone 30 mg/hr (12/11/22 0844)   dextrose 5 % and 0.45% NaCl 50 mL/hr at 12/11/22 0908   PRN Meds: acetaminophen **OR** acetaminophen, metoprolol tartrate, polyethylene glycol   Vital Signs    Vitals:   12/11/22 0300 12/11/22 0400 12/11/22 0735 12/11/22 0908  BP: 110/80 (!) 117/91 109/77 122/82  Pulse: 66 74 74 77  Resp: 16 (!) 22 (!) 23   Temp:   97.9 F (36.6 C)   TempSrc:   Oral   SpO2: 100% 98% 98%   Weight:      Height:        Intake/Output Summary (Last 24 hours) at 12/11/2022 1044 Last data filed at 12/11/2022 0718 Gross per 24 hour  Intake 1889.72 ml  Output 750 ml  Net 1139.72 ml      12/09/2022   12:40 PM 04/13/2022   12:11 PM 10/30/2020   12:12 PM  Last 3 Weights  Weight (lbs) 185 lb 190 lb 190 lb  Weight (kg) 83.915 kg 86.183 kg 86.183 kg      Telemetry    Sinus rhythm now.  Previously atrial fibrillation with RVR.  - Personally Reviewed  ECG     Atrial fibrillation.  Rate 165 bpm. CRO inferior infarct - Personally Reviewed  Physical Exam   VS:  BP 122/82   Pulse 77   Temp 97.9 F (36.6 C) (Oral)   Resp (!) 23   Ht 6' (1.829 m)   Wt 83.9 kg   SpO2 98%   BMI 25.09 kg/m  , BMI Body mass index is 25.09 kg/m. GENERAL:  Confused.  Pleasant.  No acute distress HEENT: Pupils equal round and  reactive, fundi not visualized, oral mucosa unremarkable NECK:  No jugular venous distention, waveform within normal limits, carotid upstroke brisk and symmetric, no bruits, no thyromegaly LUNGS: Diffuse rhonchi.  No crackles or wheezes HEART:  RRR.  PMI not displaced or sustained,S1 and S2 within normal limits, no S3, no S4, no clicks, no rubs, no murmurs ABD:  Flat, positive bowel sounds normal in frequency in pitch, no bruits, no rebound, no guarding, no midline pulsatile mass, no hepatomegaly, no splenomegaly EXT:  2 plus pulses throughout, no edema, no cyanosis no clubbing SKIN:  No rashes no nodules NEURO:  Cranial nerves II through XII grossly intact, motor grossly intact throughout PSYCH:  Confused.  Oriented to person.   Labs    High Sensitivity Troponin:   Recent Labs  Lab 12/09/22 1621 12/09/22 1750 12/10/22 0853  TROPONINIHS 3,208* 3,838* 3,125*     Chemistry Recent Labs  Lab 12/09/22 1621 12/10/22 0056 12/11/22 0406  NA 146* 144 147*  K 3.0* 4.1 5.0  CL 107 107 110  CO2 25  24 25  GLUCOSE 76 247* 242*  BUN 13 16 22   CREATININE 1.44* 1.43* 1.51*  CALCIUM 8.2* 8.2* 8.4*  MG 1.4* 1.9 1.6*  PROT  --  5.6* 6.0*  ALBUMIN  --  2.6* 2.5*  AST  --  64* 56*  ALT  --  23 25  ALKPHOS  --  58 58  BILITOT  --  1.9* 1.0  GFRNONAA 47* 48* 45*  ANIONGAP 14 13 12     Lipids No results for input(s): "CHOL", "TRIG", "HDL", "LABVLDL", "LDLCALC", "CHOLHDL" in the last 168 hours.  Hematology Recent Labs  Lab 12/09/22 1252 12/09/22 1256 12/10/22 0056  WBC 7.7  --  6.7  RBC 4.06*  --  3.77*  HGB 13.9 13.6 13.2  HCT 40.7 40.0 38.1*  MCV 100.2*  --  101.1*  MCH 34.2*  --  35.0*  MCHC 34.2  --  34.6  RDW 12.7  --  13.1  PLT 254  --  235   Thyroid  Recent Labs  Lab 12/10/22 0853  TSH 2.206    BNPNo results for input(s): "BNP", "PROBNP" in the last 168 hours.  DDimer No results for input(s): "DDIMER" in the last 168 hours.   Radiology    ECHOCARDIOGRAM  COMPLETE  Result Date: 12/10/2022    ECHOCARDIOGRAM REPORT   Patient Name:   Bill Jackson Date of Exam: 12/10/2022 Medical Rec #:  [id number]      Height:       72.0 in Accession #:    [id number]     Weight:       185.0 lb Date of Birth:  [birth year]      BSA:          2.061 m Patient Age:    88 years       BP:           127/94 mmHg Patient Gender: M              HR:           84 bpm. Exam Location:  Inpatient Procedure: 2D Echo, Cardiac Doppler, Color Doppler and Intracardiac            Opacification Agent Indications:    Elevated Troponin  History:        Patient has no prior history of Echocardiogram examinations.                 Risk Factors:Hypertension and Diabetes.  Sonographer:    Lucendia Herrlich Referring Phys: [phone] RALPH A NETTEY IMPRESSIONS  1. Left ventricular ejection fraction, by estimation, is 30%. The left ventricle has moderately decreased function. The left ventricle demonstrates regional wall motion abnormalities (see scoring diagram/findings for description). Left ventricular diastolic parameters are consistent with Grade I diastolic dysfunction (impaired relaxation).  2. Slow flowing apical contrast without discrete thrombus.  3. Right ventricular systolic function is normal. The right ventricular size is normal. There is normal pulmonary artery systolic pressure. The estimated right ventricular systolic pressure is 34.2 mmHg.  4. The mitral valve is grossly normal. Trivial mitral valve regurgitation.  5. The aortic valve is grossly normal. Aortic valve regurgitation is trivial. No aortic stenosis is present.  6. The inferior vena cava is normal in size with <50% respiratory variability, suggesting right atrial pressure of 8 mmHg. FINDINGS  Left Ventricle: Left ventricular ejection fraction, by estimation, is 30%. The left ventricle has moderately decreased function. The left ventricle demonstrates regional wall motion abnormalities. Definity contrast agent  was given IV to delineate the left  ventricular endocardial borders. The left ventricular internal cavity size was normal in size. There is no left ventricular hypertrophy. Left ventricular diastolic parameters are consistent with Grade I diastolic dysfunction (impaired relaxation).  LV Wall Scoring: The mid and distal anterior wall, mid and distal lateral wall, mid and distal anterior septum, mid and distal inferior wall, and mid inferoseptal segment are akinetic. The mid anterolateral segment is hypokinetic. Right Ventricle: The right ventricular size is normal. No increase in right ventricular wall thickness. Right ventricular systolic function is normal. There is normal pulmonary artery systolic pressure. The tricuspid regurgitant velocity is 2.56 m/s, and  with an assumed right atrial pressure of 8 mmHg, the estimated right ventricular systolic pressure is 34.2 mmHg. Left Atrium: Left atrial size was normal in size. Right Atrium: Right atrial size was normal in size. Pericardium: There is no evidence of pericardial effusion. Mitral Valve: The mitral valve is grossly normal. Trivial mitral valve regurgitation. Tricuspid Valve: The tricuspid valve is grossly normal. Tricuspid valve regurgitation is mild. Aortic Valve: The aortic valve is grossly normal. Aortic valve regurgitation is trivial. No aortic stenosis is present. Aortic valve peak gradient measures 5.0 mmHg. Pulmonic Valve: The pulmonic valve was grossly normal. Pulmonic valve regurgitation is trivial. Aorta: The aortic root is normal in size and structure. Venous: The inferior vena cava is normal in size with less than 50% respiratory variability, suggesting right atrial pressure of 8 mmHg. IAS/Shunts: The interatrial septum was not well visualized.  LEFT VENTRICLE PLAX 2D LVIDd:         4.60 cm   Diastology LVIDs:         3.30 cm   LV e' medial:    4.04 cm/s LV PW:         1.00 cm   LV E/e' medial:  9.5 LV IVS:        0.90 cm   LV e' lateral:   4.97 cm/s LVOT diam:     2.10 cm   LV E/e'  lateral: 7.7 LV SV:         39 LV SV Index:   19 LVOT Area:     3.46 cm  IVC IVC diam: 1.90 cm LEFT ATRIUM           Index        RIGHT ATRIUM           Index LA diam:      2.30 cm 1.12 cm/m   RA Area:     14.20 cm LA Vol (A4C): 47.5 ml 23.05 ml/m  RA Volume:   36.00 ml  17.47 ml/m  AORTIC VALVE                 PULMONIC VALVE AV Area (Vmax): 2.11 cm     PR End Diast Vel: 9.86 msec AV Vmax:        112.00 cm/s AV Peak Grad:   5.0 mmHg LVOT Vmax:      68.23 cm/s LVOT Vmean:     40.900 cm/s LVOT VTI:       0.112 m  AORTA Ao Root diam: 3.50 cm Ao Asc diam:  3.20 cm MITRAL VALVE               TRICUSPID VALVE MV Area (PHT): 4.21 cm    TR Peak grad:   26.2 mmHg MV Decel Time: 180 msec    TR Vmax:  256.00 cm/s MV E velocity: 38.30 cm/s MV A velocity: 77.10 cm/s  SHUNTS MV E/A ratio:  0.50        Systemic VTI:  0.11 m                            Systemic Diam: 2.10 cm Weston Brass MD Electronically signed by Weston Brass MD Signature Date/Time: 12/10/2022/2:26:28 PM    Final    DG CHEST PORT 1 VIEW  Result Date: 12/10/2022 CLINICAL DATA:  [place]ales EXAM: PORTABLE CHEST 1 VIEW COMPARISON:  X-ray 12/09/2022 FINDINGS: Stable cardiopericardial silhouette. Film is rotated to the left. Tortuous aorta. Increasing vascular congestion and trace edema, right-greater-than-left. No pneumothorax or effusion. No consolidation. Overlapping cardiac leads. Recommend follow-up IMPRESSION: Increasing vascular congestion and trace edema. Electronically Signed   By: Karen Kays M.D.   On: 12/10/2022 14:20   MR BRAIN WO CONTRAST  Result Date: 12/09/2022 CLINICAL DATA:  Acute neurologic deficit EXAM: MRI HEAD WITHOUT CONTRAST TECHNIQUE: Multiplanar, multiecho pulse sequences of the brain and surrounding structures were obtained without intravenous contrast. COMPARISON:  04/19/2018 FINDINGS: Brain: No acute infarct, mass effect or extra-axial collection. Postsurgical changes of the cerebellum with encephalomalacia and right-sided  siderosis. There is multifocal hyperintense T2-weighted signal within the white matter. Generalized volume loss. The midline structures are normal. Vascular: Major flow voids are preserved. Skull and upper cervical spine: Normal calvarium and skull base. Visualized upper cervical spine and soft tissues are normal. Sinuses/Orbits:No paranasal sinus fluid levels or advanced mucosal thickening. No mastoid or middle ear effusion. Normal orbits. IMPRESSION: 1. No acute intracranial abnormality. 2. Postsurgical changes of the cerebellum with encephalomalacia and siderosis. 3. Chronic ischemic microangiopathy. Electronically Signed   By: Deatra Robinson M.D.   On: 12/09/2022 21:45   DG Elbow 2 Views Right  Result Date: 12/09/2022 CLINICAL DATA:  Fall, patient rolled out of bed and was on the floor for unknown amount of time. EXAM: RIGHT ELBOW - 2 VIEW COMPARISON:  None Available. FINDINGS: There is no evidence of fracture, dislocation, or joint effusion. There is no evidence of arthropathy or other focal bone abnormality. Soft tissues are unremarkable. IMPRESSION: Negative. Electronically Signed   By: Larose Hires D.O.   On: 12/09/2022 17:34   DG Knee 2 Views Left  Result Date: 12/09/2022 CLINICAL DATA:  Fall patient rolled out of bed and was on the floor for unknown amount of time. EXAM: LEFT KNEE - 1-2 VIEW COMPARISON:  None Available. FINDINGS: No evidence of fracture, dislocation, or joint effusion. Tricompartmental knee joint space narrowing. Vascular calcifications. IMPRESSION: 1. No acute fracture or dislocation. 2. Tricompartmental knee joint space narrowing suggesting mild-to-moderate osteoarthritis. Electronically Signed   By: Larose Hires D.O.   On: 12/09/2022 17:32   DG Knee 2 Views Right  Result Date: 12/09/2022 CLINICAL DATA:  Fall.  Rolled out of bed and was on the floor EXAM: RIGHT KNEE - 1-2 VIEW COMPARISON:  None Available. FINDINGS: No evidence of fracture, dislocation, or joint effusion.  Tricompartmental knee joint space narrowing. Soft tissues are unremarkable. IMPRESSION: 1. No fracture or dislocation. 2. Mild knee osteoarthritis. Electronically Signed   By: Larose Hires D.O.   On: 12/09/2022 17:31   CT Head Wo Contrast  Result Date: 12/09/2022 CLINICAL DATA:  Neuro deficit.  Acute stroke suspected. EXAM: CT HEAD WITHOUT CONTRAST TECHNIQUE: Contiguous axial images were obtained from the base of the skull through the vertex without intravenous contrast. RADIATION DOSE  REDUCTION: This exam was performed according to the departmental dose-optimization program which includes automated exposure control, adjustment of the mA and/or kV according to patient size and/or use of iterative reconstruction technique. COMPARISON:  04/13/2022 FINDINGS: Brain: There is no evidence for acute hemorrhage, hydrocephalus, mass lesion, or abnormal extra-axial fluid collection. No definite CT evidence for acute infarction. Diffuse loss of parenchymal volume is consistent with atrophy. Patchy low attenuation in the deep hemispheric and periventricular white matter is nonspecific, but likely reflects chronic microvascular ischemic demyelination. Cerebellar encephalomalacia is stable with evidence of prior suboccipital craniectomy. Vascular: No hyperdense vessel or unexpected calcification. Skull: Suboccipital craniectomy defect evident. Sinuses/Orbits: The visualized paranasal sinuses and mastoid air cells are clear. Visualized portions of the globes and intraorbital fat are unremarkable. Other: None. IMPRESSION: 1. Stable.  No acute intracranial abnormality. 2. Atrophy with chronic small vessel ischemic disease. 3. Status post suboccipital craniectomy with stable cerebellar encephalomalacia. Electronically Signed   By: Kennith Center M.D.   On: 12/09/2022 17:06   CT CHEST ABDOMEN PELVIS W CONTRAST  Result Date: 12/09/2022 CLINICAL DATA:  Fall from bed, right-sided rib pain. EXAM: CT CHEST, ABDOMEN, AND PELVIS WITH  CONTRAST TECHNIQUE: Multidetector CT imaging of the chest, abdomen and pelvis was performed following the standard protocol during bolus administration of intravenous contrast. RADIATION DOSE REDUCTION: This exam was performed according to the departmental dose-optimization program which includes automated exposure control, adjustment of the mA and/or kV according to patient size and/or use of iterative reconstruction technique. CONTRAST:  75mL OMNIPAQUE IOHEXOL 350 MG/ML SOLN COMPARISON:  CT pelvis 10/30/2020, CT abdomen pelvis 09/07/2016. FINDINGS: CT CHEST FINDINGS Cardiovascular: Atherosclerotic calcification of the aorta, aortic valve and coronary arteries. Heart is at the upper limits of normal in size. No pericardial effusion. Mediastinum/Nodes: No pathologically enlarged mediastinal, hilar or axillary lymph nodes. Esophagus is grossly unremarkable. Lungs/Pleura: Image quality is degraded by respiratory motion. Mild dependent atelectasis bilaterally. 8 mm posterior left upper lobe nodule (5/46) with surrounding ground-glass. No pleural fluid. Airway is unremarkable. Musculoskeletal: Nondisplaced fracture of the posterolateral right eighth rib. CT ABDOMEN PELVIS FINDINGS Hepatobiliary: Liver is decreased in attenuation diffusely. Multiple hypoattenuating cysts in the liver. Liver and gallbladder are otherwise unremarkable. No biliary ductal dilatation. Pancreas: Negative. Spleen: Negative. Adrenals/Urinary Tract: Adrenal glands and kidneys are unremarkable. Ureters are decompressed. Right posterolateral bladder diverticulum. Bladder is otherwise grossly unremarkable. Stomach/Bowel: Stomach, small bowel, appendix and colon are unremarkable. Vascular/Lymphatic: Atherosclerotic calcification of the aorta. No pathologically enlarged lymph nodes. Reproductive: Prostate is visualized. Other: No free fluid.  Mesenteries and peritoneum are unremarkable. Musculoskeletal: Degenerative changes in the spine. Dextroconvex  scoliosis. IMPRESSION: 1. Nondisplaced posterolateral right eighth rib fracture. No additional evidence of acute trauma. 2. 8 mm posterior left upper lobe nodule, possibly infectious/inflammatory in etiology. Recommend follow-up CT chest without contrast in 4-6 weeks in further evaluation, as clinically indicated, as malignancy cannot be excluded. 3. Hepatic steatosis. 4. Aortic atherosclerosis (ICD10-I70.0). Coronary artery calcification. Electronically Signed   By: Leanna Battles M.D.   On: 12/09/2022 14:12   DG Chest Port 1 View  Result Date: 12/09/2022 CLINICAL DATA:  Trauma EXAM: PORTABLE CHEST 1 VIEW COMPARISON:  CXR 09/22/14 FINDINGS: No pleural effusion. No pneumothorax. No focal airspace opacity. Normal cardiac and mediastinal contours. No radiographically apparent displaced rib fractures. Visualized upper abdomen is unremarkable. IMPRESSION: No focal airspace opacity. Electronically Signed   By: Lorenza Cambridge M.D.   On: 12/09/2022 13:16   DG Pelvis Portable  Result Date: 12/09/2022 CLINICAL DATA:  Pain after trauma EXAM: PORTABLE PELVIS 1 VIEWS COMPARISON:  None Available. FINDINGS: Osteopenia. Hyperostosis. No fracture or dislocation. Preserved joint spaces of the hips and pubic symphysis. Mild degenerative changes of the sacroiliac joints. Vascular calcifications. Overlapping cardiac leads. With this level of osteopenia evaluation for subtle nondisplaced injury is difficult to completely exclude and if there is further concern, additional cross-sectional imaging as clinically appropriate IMPRESSION: Osteopenia.  Mild degenerative changes. Electronically Signed   By: Karen Kays M.D.   On: 12/09/2022 13:12    Cardiac Studies   Echo 12/11/22: 1. Left ventricular ejection fraction, by estimation, is 30%. The left  ventricle has moderately decreased function. The left ventricle  demonstrates regional wall motion abnormalities (see scoring  diagram/findings for description). Left ventricular   diastolic parameters are consistent with Grade I diastolic dysfunction  (impaired relaxation).   2. Slow flowing apical contrast without discrete thrombus.   3. Right ventricular systolic function is normal. The right ventricular  size is normal. There is normal pulmonary artery systolic pressure. The  estimated right ventricular systolic pressure is 34.2 mmHg.   4. The mitral valve is grossly normal. Trivial mitral valve  regurgitation.   5. The aortic valve is grossly normal. Aortic valve regurgitation is  trivial. No aortic stenosis is present.   6. The inferior vena cava is normal in size with <50% respiratory  variability, suggesting right atrial pressure of 8 mmHg.    Patient Profile     Mr. Snowball is an 31M with hemorrhagic stroke, hypertension, hyperlipidemia, DM admitted with broken rib after a fall and being down for hours. Cardiology consulted for newly diagnosed HF and elevated troponin.   Assessment & Plan    # Acute systolic and diastolic HF:  # Hypertension:  LVEF this admission 30%.  On my view, concerning for Takotsubo cardiomyopathy with basal sparing and hypokinesis of the mid and apical regions.  He is confused and non-cooperative.  Not a candidate for an ischemic evaluation at this time.  Recommend medical therapy and repeat echo as an outpatient to assess for improvement.  He has no chest pain.  Amlodipine discontinued given reduced systolic function.  He was started on carvedilol 12.5mg  bid.  Add ARB/Entresto and spironolactone as renal function allows.    # Elevated troponin:  Suspect demand ischemia and Takotsubo as above.  Conservative management.    # PAF:  Newly noted this admission.  Converted to sinus rhythm on IV amiodarone.  Not started on anticoagulation due to h/o hemorrhagic stroke.  Will convert amiodarone to 200mg  bid x14 days then 200mg  daily.  Not a Watchman candidate at this time.       For questions or updates, please contact [facility] Please consult [email or web address] for contact info under        Signed, Chilton Si, MD  12/11/2022, 10:44 AM

## 2022-12-11 NOTE — Progress Notes (Signed)
Pt had 5 beat run of VTACH. VSS. 96 HR, 116/92 BP 23 RR. Oncall provider notified.Labs added . Will continue to monitor

## 2022-12-11 NOTE — Progress Notes (Signed)
PA Meng with cardiology called and spoke with RN, changes made to pt's carvedilol. Pt remain stable and report given to oncoming RN. Dionne Bucy RN

## 2022-12-11 NOTE — Progress Notes (Signed)
Pt back to the unit. VSS, will continue to monitor closely. Dionne Bucy RN

## 2022-12-11 NOTE — Progress Notes (Signed)
MEWS Progress Note  Patient Details Name: Bill Jackson MRN: [id number] DOB: 1934/08/16 Today's Date: 12/11/2022   MEWS Flowsheet Documentation:  Assess: MEWS Score Temp: 99.1 F (37.3 C) BP: 94/70 MAP (mmHg): 78 Pulse Rate: (!) 102 ECG Heart Rate: (!) 139 Resp: (!) 30 Level of Consciousness: Alert SpO2: 99 % O2 Device: Nasal Cannula O2 Flow Rate (L/min): 2 L/min Assess: MEWS Score MEWS Temp: 0 MEWS Systolic: 1 MEWS Pulse: 3 MEWS RR: 0 MEWS LOC: 0 MEWS Score: 4 MEWS Score Color: Red Assess: SIRS CRITERIA SIRS Temperature : 0 SIRS Respirations : 0 SIRS Pulse: 1 SIRS WBC: 0 SIRS Score Sum : 1 SIRS Temperature : 0 SIRS Pulse: 1 SIRS Respirations : 0 SIRS WBC: 0 SIRS Score Sum : 1 Assess: if the MEWS score is Yellow or Red Were vital signs taken at a resting state?: Yes Focused Assessment: Change from prior assessment (see assessment flowsheet) Does the patient meet 2 or more of the SIRS criteria?: No MEWS guidelines implemented : Yes, red Treat MEWS Interventions: Considered administering scheduled or prn medications/treatments as ordered Take Vital Signs Increase Vital Sign Frequency : Red: Q1hr x2, continue Q4hrs until patient remains green for 12hrs Escalate MEWS: Escalate: Red: Discuss with charge nurse and notify provider. Consider notifying RRT. If remains red for 2 hours consider need for higher level of care Notify: Charge Nurse/RN Name of Charge Nurse/RN Notified: [facility] Provider Notification Provider Name/Title: Saundra Shelling (Cardiology) Date Provider Notified: 12/10/22 Time Provider Notified: 1947 Method of Notification: Face-to-face Notification Reason: New onset of dysrhythmia Provider response: See new orders Date of Provider Response: 12/10/22 Time of Provider Response: 1945      Bill Jackson 12/11/2022, 12:37 AM

## 2022-12-11 NOTE — Progress Notes (Signed)
Pt off unit to fluoroscopy. Bill Jackson

## 2022-12-11 NOTE — Progress Notes (Addendum)
Transition of Care Department [facility]) has reviewed patient and note pt from home alone, Pt is followed by [facility] clinic for primary care needs- CSW contact at the [place]- [phone] ext [phone]. She request to be contacted when EDD known for coordination of care and patients needs at home. We will continue to monitor patient advancement through interdisciplinary progression rounds. If new patient transition needs arise, please place a TOC consult.

## 2022-12-11 NOTE — Progress Notes (Signed)
Pt's HR noted to be in the upper 40's to lower 60 range. Pt BP soft in the 80's to 90's and MD attending aware. Telemetry called to report pt having 23 beats run of SVT. Pt asymptomatic, BP 99/77 with MAP of 86. HR in the 50's. Dr. Caleb Popp notified and asked for RN to notify cardiology. On-call cardiology PA [facility] notified via page, awaiting on response. Pt resting in bed with call light within reach. Will continue to closely monitor. Dionne Bucy RN

## 2022-12-12 DIAGNOSIS — Z8673 Personal history of transient ischemic attack (TIA), and cerebral infarction without residual deficits: Secondary | ICD-10-CM | POA: Diagnosis not present

## 2022-12-12 DIAGNOSIS — R7989 Other specified abnormal findings of blood chemistry: Secondary | ICD-10-CM | POA: Diagnosis not present

## 2022-12-12 DIAGNOSIS — I5020 Unspecified systolic (congestive) heart failure: Secondary | ICD-10-CM | POA: Diagnosis not present

## 2022-12-12 DIAGNOSIS — I480 Paroxysmal atrial fibrillation: Secondary | ICD-10-CM | POA: Diagnosis not present

## 2022-12-12 LAB — BASIC METABOLIC PANEL
Anion gap: 8 (ref 5–15)
BUN: 25 mg/dL — ABNORMAL HIGH (ref 8–23)
CO2: 26 mmol/L (ref 22–32)
Calcium: 8.1 mg/dL — ABNORMAL LOW (ref 8.9–10.3)
Chloride: 109 mmol/L (ref 98–111)
Creatinine, Ser: 1.38 mg/dL — ABNORMAL HIGH (ref 0.61–1.24)
GFR, Estimated: 50 mL/min — ABNORMAL LOW (ref >60)
Glucose, Bld: 90 mg/dL (ref 70–99)
Potassium: 4.9 mmol/L (ref 3.5–5.1)
Sodium: 143 mmol/L (ref 135–145)

## 2022-12-12 LAB — GLUCOSE, CAPILLARY
Glucose-Capillary: 120 mg/dL — ABNORMAL HIGH (ref 70–99)
Glucose-Capillary: 138 mg/dL — ABNORMAL HIGH (ref 70–99)
Glucose-Capillary: 225 mg/dL — ABNORMAL HIGH (ref 70–99)
Glucose-Capillary: 149 mg/dL — ABNORMAL HIGH (ref 70–99)

## 2022-12-12 LAB — CK: Total CK: 447 U/L — ABNORMAL HIGH (ref 49–397)

## 2022-12-12 NOTE — Evaluation (Signed)
Physical Therapy Evaluation Patient Details Name: Bill Jackson MRN: [id number] DOB: [birth year] Today's Date: 12/12/2022  History of Present Illness  88 y.o. male presenting 12/09/22 after fall at home. +rt 8th rib fx; dehydration; Cardiology consult due to elevated troponins--acute HF with EF 30%, demand ischemia. Confusion  PMH significant of diabetes, stroke (residual rt sided weakness) syncope, hypertension, low back pain, glaucoma, neuropathy, GERD  Clinical Impression   Pt admitted secondary to problem above with deficits below. PTA patient was living alone. He endorses multiple falls.  Pt currently requires +2 mod assist to come to stand and only albe to walk 2 ft to chair with RW and +2 min assist.  Anticipate patient will benefit from PT to address problems listed below.Will continue to follow acutely to maximize functional mobility independence and safety.          Recommendations for follow up therapy are one component of a multi-disciplinary discharge planning process, led by the attending physician.  Recommendations may be updated based on patient status, additional functional criteria and insurance authorization.  Follow Up Recommendations Can patient physically be transported by private vehicle: No     Assistance Recommended at Discharge Frequent or constant Supervision/Assistance  Patient can return home with the following  Two people to help with walking and/or transfers;Assistance with cooking/housework;Assistance with feeding;Direct supervision/assist for medications management;Direct supervision/assist for financial management;Assist for transportation;Help with stairs or ramp for entrance    Equipment Recommendations None recommended by PT  Recommendations for Other Services       Functional Status Assessment Patient has had a recent decline in their functional status and demonstrates the ability to make significant improvements in function in a reasonable and  predictable amount of time.     Precautions / Restrictions Precautions Precautions: Fall Precaution Comments: pt endorses multiple falls at home      Mobility  Bed Mobility Overal bed mobility: Needs Assistance Bed Mobility: Supine to Sit     Supine to sit: Mod assist     General bed mobility comments: pt attempting supine to sit to his right; partial roll (because of rib fx on rt) and assist to raise his torso (pt took legs over EOB himself); once upright able to scoot out to EOB with minguard    Transfers Overall transfer level: Needs assistance Equipment used: Rolling walker (2 wheels) Transfers: Sit to/from Stand, Bed to chair/wheelchair/BSC Sit to Stand: Mod assist, +2 physical assistance   Step pivot transfers: Min assist, +2 physical assistance, From elevated surface       General transfer comment: from bed at lowest height, +2 mod assist with initial strong posterior lean but able to correct and stand for ~1 minute; returned to sitting at EOB; from elevated bed +2 min assist with pt then side-stepping to his rt; returned to sit and stood one more time for transfer to chair    Ambulation/Gait             Pre-gait activities: side step-pivot to chair with RW and +2 min assist    Stairs            Wheelchair Mobility    Modified Rankin (Stroke Patients Only)       Balance Overall balance assessment: History of Falls, Needs assistance Sitting-balance support: No upper extremity supported, Feet supported Sitting balance-Leahy Scale: Fair     Standing balance support: Bilateral upper extremity supported Standing balance-Leahy Scale: Poor  Pertinent Vitals/Pain Pain Assessment Pain Assessment: No/denies pain    Home Living Family/patient expects to be discharged to:: Unsure Living Arrangements: Alone Available Help at Discharge: Family;Personal care attendant (aide MWF) Type of Home: House Home  Access: Ramped entrance       Home Layout: One level Home Equipment: Rollator (4 wheels);Grab bars - tub/shower Additional Comments: information from pt with ?accuracy    Prior Function Prior Level of Function : Independent/Modified Independent;Patient poor historian/Family not available;History of Falls (last six months)             Mobility Comments: reports he walks with a rollator       Hand Dominance        Extremity/Trunk Assessment   Upper Extremity Assessment Upper Extremity Assessment: Defer to OT evaluation    Lower Extremity Assessment Lower Extremity Assessment: Generalized weakness (requires assist for sit to stand)    Cervical / Trunk Assessment Cervical / Trunk Assessment: Kyphotic  Communication   Communication: Expressive difficulties;HOH (slurred, garbled (at times unintelligible))  Cognition Arousal/Alertness: Awake/alert Behavior During Therapy: WFL for tasks assessed/performed Overall Cognitive Status: No family/caregiver present to determine baseline cognitive functioning                                 General Comments: pt oriented to self, [place], that he fell; not oriented to day/month        General Comments      Exercises     Assessment/Plan    PT Assessment Patient needs continued PT services  PT Problem List Decreased strength;Decreased balance;Decreased mobility;Decreased cognition;Decreased knowledge of use of DME;Decreased safety awareness       PT Treatment Interventions DME instruction;Gait training;Functional mobility training;Therapeutic activities;Therapeutic exercise;Balance training;Cognitive remediation;Patient/family education    PT Goals (Current goals can be found in the Care Plan section)  Acute Rehab PT Goals Patient Stated Goal: unable to state, agrees with PT goals to get him moving better PT Goal Formulation: Patient unable to participate in goal setting Time For Goal Achievement:  12/26/22 Potential to Achieve Goals: Fair    Frequency Min 2X/week     Co-evaluation               AM-PAC PT "6 Clicks" Mobility  Outcome Measure Help needed turning from your back to your side while in a flat bed without using bedrails?: A Lot Help needed moving from lying on your back to sitting on the side of a flat bed without using bedrails?: A Lot Help needed moving to and from a bed to a chair (including a wheelchair)?: Total Help needed standing up from a chair using your arms (e.g., wheelchair or bedside chair)?: Total Help needed to walk in hospital room?: Total Help needed climbing 3-5 steps with a railing? : Total 6 Click Score: 8    End of Session Equipment Utilized During Treatment: Gait belt Activity Tolerance: Patient tolerated treatment well Patient left: in chair;with call bell/phone within reach;with chair alarm set Nurse Communication: Mobility status;Other (comment) (thin liquid drinks on his bedside table; his lunch arrived and needs full supervision to eat due to dysphagia) PT Visit Diagnosis: Unsteadiness on feet (R26.81);Repeated falls (R29.6);Muscle weakness (generalized) (M62.81)    Time: 4098-1191 PT Time Calculation (min) (ACUTE ONLY): 26 min   Charges:   PT Evaluation $PT Eval Low Complexity: 1 Low PT Treatments $Therapeutic Activity: 8-22 mins         Larita Fife  Kathie Rhodes, PT Acute Rehabilitation Services  Office [phone]   Zena Amos 12/12/2022, 2:50 PM

## 2022-12-12 NOTE — Plan of Care (Signed)
In sinus rhythm. Blood pressures on the soft side. Recommend consideration of GDMT as an outpatient along with repeat imaging. Can continue low dose carvedilol. Can continue amiodarone for PAF. Otherwise no changes from a cardiac standpoint. We can work on follow up. Cardiology will sign off.

## 2022-12-12 NOTE — Progress Notes (Signed)
PROGRESS NOTE    SEAMON BRUEGGEMANN  RUE:[id number] DOB: 12/16/1934 DOA: 12/09/2022 PCP: Margit Hanks, MD (Inactive)   Brief Narrative: RODOLPH PATILLO is a 88 y.o. male with a history of stroke, diabetes, syncope, hypertension, low back pain, glaucoma, neuropathy, GERD.  Patient presents secondary to having a fall and being found down.  On admission he is found to have evidence of a right eighth rib fracture in addition to elevated troponin concern for possible NSTEMI.  Patient also was found to have multiple electrolyte abnormalities and was started on IV fluids in addition to electrolyte supplementation.  Cardiology was consulted for elevated troponin and ongoing evaluation.  Echocardiogram revealed reduced LVEF of 30% with cardiology assessment of Takotsubo cardiomyopathy and associated demand ischemia.  During admission, patient developed transition of rhythm to atrial fibrillation with associated rapid ventricular rate.  Rate controlled eventually with metoprolol, digoxin, amiodarone.  Patient converted back to sinus rhythm.  Hospitalization also complicated by confusion/delirium.   Assessment and Plan:  Rib fracture Secondary to fall involving the right eighth rib. Pain is improving. -Pain management  Syncope and collapse Unclear etiology. Patient states he has been having falls since he had his stroke several years ago. He reports passing out, however. Unclear if this is cardiac related as patient was noted to have a short run of V-tach. Transthoracic Echocardiogram significant for reduced LVEF of 30%. Patient developed atrial fibrillation during hospitalization witch conversion back to sinus rhythm. -Continue telemetry  Elevated troponin In setting of prolonged time down. Troponin peak of 3,838. Patient without reports of substernal chest pain. No dyspnea. Transthoracic Echocardiogram ordered. Cardiology consulted. -Continue telemetry -Cardiology recommendations: demand ischemia, no  ischemic workup  Acute combined systolic and diastolic heart failure Transthoracic Echocardiogram significant for an LVEF of 30%. Cardiology concerned for Takotsubo cardiomyopathy. Secondary to behavioral concerns, ischemic workup is deferred and recommendation for ongoing medical management. Coreg started. Plan for ARB/Entresto and spironolactone as renal function allows. Coreg discontinued secondary to bradycardia.  Non-sustained bentricular tachycardia Non-sustained. Noted on telemetry. Complicated by patient's history of fall and syncope. Potassium and magnesium low on admission and are improved. -Continue telemetry  Paroxysmal atrial fibrillation with RVR Patient converted to atrial fibrillation with associated rapid heart rate on 5/9. Metoprolol given with resultant soft blood pressure. Cardiology started amiodarone and gave one dose of digoxin. Patient has converted back into sinus rhythm. Amiodarone continued.  Sinus bradycardia Likely related to beta-blocker. Heart rate as low as 40s. Patient is asymptomatic but has not yet ambulated. Cardiology is on board.  Possible rhabdomyolysis CK increasing. Unclear if this is related to cardiac etiology vs traumatic rhabdomyolysis from fall and being down. Patient managed with IV fluids which are now discontinued secondary to developing pulmonary vascular congestion. CK trending down.  Confusion Possible delirium. Per sister, patient has had confusion for at least the last month. Current presentation is not acute. Urinalysis not suggestive of infection. TSH normal. Vitamin B12 normal. Confusion is improved this morning, but if he has underling cognitive impairment with sundowning, possible he will have recurrent episodes. -Delirium precautions  Hypernatremia Sodium of 150 on admission. Resolved with IV fluids but slightly worsened off of IV fluids. IV fluids restarted with repeat resolution. -Hold IV fluids  Hypokalemia Potassium of 3.2 on  admission with a low of 3.0. Improved to 4.1 and is stable.  Diabetes mellitus, type II Patient is on Lantus and appears to be on a sliding scale of 10 to 20 units daily based on  blood sugar.  Blood sugars uncontrolled this admission. Hemoglobin A1c of 5.6%. -Continue sliding scale -Continue Semglee 10 units daily  Hypomagnesemia Magnesium of 1.4 on admission. Improved with repletion.  History of CVA Concern for acute stroke on admission. MRI ruled out acute stroke. -Continue aspirin  Primary hypertension Blood pressure uncontrolled.  Glaucoma -Continue latanoprost, Cosopt and brimonidine  Left upper pulmonary nodule Noted on imaging. Possibly infectious/inflammatory measuring 8 mm. Recommendation for follow-up CT chest without contrast in 4-6 weeks  DVT prophylaxis: Lovenox Code Status:   Code Status: DNR Family Communication: None at bedside. Son via telephone, but went to voicemail. Disposition Plan: Discharge pending continued management of medical issues, cardiology recommendations, and PT/OT recommendations   Consultants:  Cardiology  Procedures:  5/9: Transthoracic Echocardiogram   Antimicrobials: None    Subjective: Patient reports no issues today. Right sided rib pain has been improving.  Objective: BP 101/76 (BP Location: Right Arm)   Pulse 61   Temp 97.9 F (36.6 C) (Oral)   Resp 20   Ht 6' (1.829 m)   Wt 83.9 kg   SpO2 100%   BMI 25.09 kg/m   Examination:  General exam: Appears calm and comfortable Respiratory system: Clear to auscultation. Respiratory effort normal. Cardiovascular system: S1 & S2 heard, RRR.  Gastrointestinal system: Abdomen is nondistended, soft and nontender. Normal bowel sounds heard. Central nervous system: Alert and oriented to person and place. Dysarthria. Musculoskeletal: No calf tenderness Skin: No cyanosis. No rashes   Data Reviewed: I have personally reviewed following labs and imaging studies  CBC Lab Results   Component Value Date   WBC 6.7 12/10/2022   RBC 3.77 (L) 12/10/2022   HGB 13.2 12/10/2022   HCT 38.1 (L) 12/10/2022   MCV 101.1 (H) 12/10/2022   MCH 35.0 (H) 12/10/2022   PLT 235 12/10/2022   MCHC 34.6 12/10/2022   RDW 13.1 12/10/2022   LYMPHSABS 0.8 12/09/2022   MONOABS 0.9 12/09/2022   EOSABS 0.0 12/09/2022   BASOSABS 0.0 12/09/2022     Last metabolic panel Lab Results  Component Value Date   NA 143 12/12/2022   K 4.9 12/12/2022   CL 109 12/12/2022   CO2 26 12/12/2022   BUN 25 (H) 12/12/2022   CREATININE 1.38 (H) 12/12/2022   GLUCOSE 90 12/12/2022   GFRNONAA 50 (L) 12/12/2022   GFRAA 56 (L) 04/09/2018   CALCIUM 8.1 (L) 12/12/2022   PHOS 1.9 (L) 12/11/2022   PROT 6.0 (L) 12/11/2022   ALBUMIN 2.5 (L) 12/11/2022   BILITOT 1.0 12/11/2022   ALKPHOS 58 12/11/2022   AST 56 (H) 12/11/2022   ALT 25 12/11/2022   ANIONGAP 8 12/12/2022    GFR: Estimated Creatinine Clearance: 42.2 mL/min (A) (by C-G formula based on SCr of 1.38 mg/dL (H)).  No results found for this or any previous visit (from the past 240 hour(s)).    Radiology Studies: DG Swallowing Func-Speech Pathology  Result Date: 12/11/2022 Table formatting from the original result was not included. Modified Barium Swallow Study Patient Details Name: LEXUS STENNER MRN: [id number] Date of Birth: 07-Jan-1935 Today's Date: 12/11/2022 HPI/PMH: HPI: Patient is an 88 y.o. male with PMH: DM, CVA (with h/o dysphagia requiring modified diet and PEG), syncope, HTN, GERD, neuropathy He presented to the hospital on 12/09/22 following fall at home. (patient lives by himself but with nursing visits several times a week) Family was unable to reach him on the phone and they called EMS. In ED, patient's vitals were stable, CT head  without acute intracranial abnormality, T of the chest abdomen and pelvis noting only eighth rib fracture on the right, left upper lobe nodule of 8 mm, hepatic steatosis. SLP swallow evaluation ordered secondary to  observed coughing with PO's by nursing. Clinical Impression: Clinical Impression: Pt demonstrates impaired swallowing with sensed, moderately significant aspiration events of large nectar and thin sips before the swallow. When thin or nectar sips are smaller and controlled pt has instances of trace aspiration just below the vocal folds with ejection. A chin tuck with large sips also reduced severity of aspiration to trace quantities that are ejected. Puree and mechanical soft solids are tolerated with minimal pharyngeal residue. Pt does not appear to have significant weakness but there was inconsistent collections of mild residue in sulci. For now, pt to continue dys 2/honey thick, but would like therapists that f/u to engage pt and family in discussion about acceptance of risk with thin liquids and strategies that may reduce risk when possible. Pt says he hates honey thick liquids and is likely not consuming them at home anyway. Factors that may increase risk of adverse event in presence of aspiration Rubye Oaks & Clearance Coots 2021): Factors that may increase risk of adverse event in presence of aspiration Rubye Oaks & Clearance Coots 2021): Reduced cognitive function Recommendations/Plan: Swallowing Evaluation Recommendations Swallowing Evaluation Recommendations Recommendations: PO diet PO Diet Recommendation: Moderately thick liquids (Level 3, honey thick); Dysphagia 2 (Finely chopped) Medication Administration: Whole meds with puree Supervision: Staff to assist with self-feeding Swallowing strategies  : Slow rate; Small bites/sips Treatment Plan Treatment Plan Treatment recommendations: Therapy as outlined in treatment plan below Follow-up recommendations: Home health SLP Treatment frequency: Min 2x/week Treatment duration: 2 weeks Recommendations Recommendations for follow up therapy are one component of a multi-disciplinary discharge planning process, led by the attending physician.  Recommendations may be updated based on  patient status, additional functional criteria and insurance authorization. Assessment: Orofacial Exam: Orofacial Exam Oral Cavity - Dentition: Adequate natural dentition Anatomy: Anatomy: WFL Boluses Administered: Boluses Administered Boluses Administered: Thin liquids (Level 0); Mildly thick liquids (Level 2, nectar thick); Moderately thick liquids (Level 3, honey thick); Puree; Solid  Oral Impairment Domain: Oral Impairment Domain Lip Closure: No labial escape Tongue control during bolus hold: Escape to lateral buccal cavity/floor of mouth Bolus preparation/mastication: Slow prolonged chewing/mashing with complete recollection Bolus transport/lingual motion: Delayed initiation of tongue motion (oral holding) Oral residue: Trace residue lining oral structures Location of oral residue : Tongue; Floor of mouth Initiation of pharyngeal swallow : Pyriform sinuses  Pharyngeal Impairment Domain: Pharyngeal Impairment Domain Soft palate elevation: No bolus between soft palate (SP)/pharyngeal wall (PW) Laryngeal elevation: Complete superior movement of thyroid cartilage with complete approximation of arytenoids to epiglottic petiole Anterior hyoid excursion: Complete anterior movement Epiglottic movement: Complete inversion Laryngeal vestibule closure: Complete, no air/contrast in laryngeal vestibule Pharyngeal stripping wave : Present - complete Pharyngoesophageal segment opening: Complete distension and complete duration, no obstruction of flow Tongue base retraction: Trace column of contrast or air between tongue base and PPW Pharyngeal residue: Collection of residue within or on pharyngeal structures Location of pharyngeal residue: Valleculae; Pyriform sinuses  Esophageal Impairment Domain: No data recorded Pill: No data recorded Penetration/Aspiration Scale Score: Penetration/Aspiration Scale Score 1.  Material does not enter airway: Thin liquids (Level 0); Mildly thick liquids (Level 2, nectar thick); Moderately  thick liquids (Level 3, honey thick); Puree; Solid 6.  Material enters airway, passes BELOW cords then ejected out: Thin liquids (Level 0); Mildly thick liquids (Level 2, nectar  thick) Compensatory Strategies: Compensatory Strategies Compensatory strategies: Yes Chin tuck: Effective Effective Chin Tuck: Thin liquid (Level 0); Mildly thick liquid (Level 2, nectar thick) (trace aspiration only)   General Information: Caregiver present: No  Diet Prior to this Study: Dysphagia 1 (pureed); Moderately thick liquids (Level 3, honey thick)   Temperature : Normal   Respiratory Status: WFL   Supplemental O2: None (Room air)   No data recorded Behavior/Cognition: Alert; Cooperative; Pleasant mood Self-Feeding Abilities: Needs assist with self-feeding Baseline vocal quality/speech: Normal Volitional Cough: Able to elicit Volitional Swallow: Able to elicit No data recorded Goal Planning: Prognosis for improved oropharyngeal function: Fair No data recorded No data recorded Patient/Family Stated Goal: nothing specific No data recorded Pain: Pain Assessment Pain Assessment: No/denies pain End of Session: Start Time:SLP Start Time (ACUTE ONLY): 1330 Stop Time: SLP Stop Time (ACUTE ONLY): 1400 Time Calculation:SLP Time Calculation (min) (ACUTE ONLY): 30 min Charges: SLP Evaluations $ SLP Speech Visit: 1 Visit SLP Evaluations $BSS Swallow: 1 Procedure $MBS Swallow: 1 Procedure $Swallowing Treatment: 1 Procedure SLP visit diagnosis: SLP Visit Diagnosis: Dysphagia, oropharyngeal phase (R13.12) Past Medical History: Past Medical History: Diagnosis Date  Diabetes mellitus   Dysphagia following cerebrovascular accident   PEG tube  Hemorrhagic stroke (HCC) 12/10/2022  Hyperlipidemia   Hypertension   Left shoulder pain   Right knee pain   Stroke (HCC) 10/01/2012  R side weakness, slurred speech, dizziness, visual changes, ataxia; hemorrhagic cerebellar CVA, evacuated Past Surgical History: Past Surgical History: Procedure Laterality Date   CARDIAC SURGERY    CRANIECTOMY N/A 10/25/2012  Procedure: Suboccipital Craniectomy for Evacuation of Cerebellar Hematoma;  Surgeon: Maeola Harman, MD;  Location: [facility];  Service: Neurosurgery;  Laterality: N/A;  Suboccipital Craniectomy for Evacuation of Cerebellar Hematoma  heart stents  x3  HERNIA REPAIR    PEG TUBE PLACEMENT    placed ?2014, changed 01/2014 (at [facility])  ROTATOR CUFF REPAIR   DeBlois, Riley Nearing 12/11/2022, 2:29 PM  ECHOCARDIOGRAM COMPLETE  Result Date: 12/10/2022    ECHOCARDIOGRAM REPORT   Patient Name:   AVYON KELLEY Date of Exam: 12/10/2022 Medical Rec #:  [id number]      Height:       72.0 in Accession #:    [id number]     Weight:       185.0 lb Date of Birth:  07-18-1935      BSA:          2.061 m Patient Age:    88 years       BP:           127/94 mmHg Patient Gender: M              HR:           84 bpm. Exam Location:  Inpatient Procedure: 2D Echo, Cardiac Doppler, Color Doppler and Intracardiac            Opacification Agent Indications:    Elevated Troponin  History:        Patient has no prior history of Echocardiogram examinations.                 Risk Factors:Hypertension and Diabetes.  Sonographer:    Lucendia Herrlich Referring Phys: [phone] Jannah Guardiola A Hektor Huston IMPRESSIONS  1. Left ventricular ejection fraction, by estimation, is 30%. The left ventricle has moderately decreased function. The left ventricle demonstrates regional wall motion abnormalities (see scoring diagram/findings for description). Left ventricular diastolic parameters are consistent with Grade I diastolic  dysfunction (impaired relaxation).  2. Slow flowing apical contrast without discrete thrombus.  3. Right ventricular systolic function is normal. The right ventricular size is normal. There is normal pulmonary artery systolic pressure. The estimated right ventricular systolic pressure is 34.2 mmHg.  4. The mitral valve is grossly normal. Trivial mitral valve regurgitation.  5. The aortic valve is grossly normal.  Aortic valve regurgitation is trivial. No aortic stenosis is present.  6. The inferior vena cava is normal in size with <50% respiratory variability, suggesting right atrial pressure of 8 mmHg. FINDINGS  Left Ventricle: Left ventricular ejection fraction, by estimation, is 30%. The left ventricle has moderately decreased function. The left ventricle demonstrates regional wall motion abnormalities. Definity contrast agent was given IV to delineate the left ventricular endocardial borders. The left ventricular internal cavity size was normal in size. There is no left ventricular hypertrophy. Left ventricular diastolic parameters are consistent with Grade I diastolic dysfunction (impaired relaxation).  LV Wall Scoring: The mid and distal anterior wall, mid and distal lateral wall, mid and distal anterior septum, mid and distal inferior wall, and mid inferoseptal segment are akinetic. The mid anterolateral segment is hypokinetic. Right Ventricle: The right ventricular size is normal. No increase in right ventricular wall thickness. Right ventricular systolic function is normal. There is normal pulmonary artery systolic pressure. The tricuspid regurgitant velocity is 2.56 m/s, and  with an assumed right atrial pressure of 8 mmHg, the estimated right ventricular systolic pressure is 34.2 mmHg. Left Atrium: Left atrial size was normal in size. Right Atrium: Right atrial size was normal in size. Pericardium: There is no evidence of pericardial effusion. Mitral Valve: The mitral valve is grossly normal. Trivial mitral valve regurgitation. Tricuspid Valve: The tricuspid valve is grossly normal. Tricuspid valve regurgitation is mild. Aortic Valve: The aortic valve is grossly normal. Aortic valve regurgitation is trivial. No aortic stenosis is present. Aortic valve peak gradient measures 5.0 mmHg. Pulmonic Valve: The pulmonic valve was grossly normal. Pulmonic valve regurgitation is trivial. Aorta: The aortic root is normal in  size and structure. Venous: The inferior vena cava is normal in size with less than 50% respiratory variability, suggesting right atrial pressure of 8 mmHg. IAS/Shunts: The interatrial septum was not well visualized.  LEFT VENTRICLE PLAX 2D LVIDd:         4.60 cm   Diastology LVIDs:         3.30 cm   LV e' medial:    4.04 cm/s LV PW:         1.00 cm   LV E/e' medial:  9.5 LV IVS:        0.90 cm   LV e' lateral:   4.97 cm/s LVOT diam:     2.10 cm   LV E/e' lateral: 7.7 LV SV:         39 LV SV Index:   19 LVOT Area:     3.46 cm  IVC IVC diam: 1.90 cm LEFT ATRIUM           Index        RIGHT ATRIUM           Index LA diam:      2.30 cm 1.12 cm/m   RA Area:     14.20 cm LA Vol (A4C): 47.5 ml 23.05 ml/m  RA Volume:   36.00 ml  17.47 ml/m  AORTIC VALVE                 PULMONIC  VALVE AV Area (Vmax): 2.11 cm     PR End Diast Vel: 9.86 msec AV Vmax:        112.00 cm/s AV Peak Grad:   5.0 mmHg LVOT Vmax:      68.23 cm/s LVOT Vmean:     40.900 cm/s LVOT VTI:       0.112 m  AORTA Ao Root diam: 3.50 cm Ao Asc diam:  3.20 cm MITRAL VALVE               TRICUSPID VALVE MV Area (PHT): 4.21 cm    TR Peak grad:   26.2 mmHg MV Decel Time: 180 msec    TR Vmax:        256.00 cm/s MV E velocity: 38.30 cm/s MV A velocity: 77.10 cm/s  SHUNTS MV E/A ratio:  0.50        Systemic VTI:  0.11 m                            Systemic Diam: 2.10 cm Weston Brass MD Electronically signed by Weston Brass MD Signature Date/Time: 12/10/2022/2:26:28 PM    Final    DG CHEST PORT 1 VIEW  Result Date: 12/10/2022 CLINICAL DATA:  [facility] EXAM: PORTABLE CHEST 1 VIEW COMPARISON:  X-ray 12/09/2022 FINDINGS: Stable cardiopericardial silhouette. Film is rotated to the left. Tortuous aorta. Increasing vascular congestion and trace edema, right-greater-than-left. No pneumothorax or effusion. No consolidation. Overlapping cardiac leads. Recommend follow-up IMPRESSION: Increasing vascular congestion and trace edema. Electronically Signed   By: Karen Kays  M.D.   On: 12/10/2022 14:20      LOS: 1 day    Jacquelin Hawking, MD [facility] 12/12/2022, 8:37 AM   If 7PM-7AM, please contact night-coverage [email or web address]

## 2022-12-13 DIAGNOSIS — Z8673 Personal history of transient ischemic attack (TIA), and cerebral infarction without residual deficits: Secondary | ICD-10-CM | POA: Diagnosis not present

## 2022-12-13 DIAGNOSIS — I480 Paroxysmal atrial fibrillation: Secondary | ICD-10-CM | POA: Diagnosis not present

## 2022-12-13 DIAGNOSIS — I5020 Unspecified systolic (congestive) heart failure: Secondary | ICD-10-CM | POA: Diagnosis not present

## 2022-12-13 DIAGNOSIS — R7989 Other specified abnormal findings of blood chemistry: Secondary | ICD-10-CM | POA: Diagnosis not present

## 2022-12-13 LAB — GLUCOSE, CAPILLARY
Glucose-Capillary: 178 mg/dL — ABNORMAL HIGH (ref 70–99)
Glucose-Capillary: 181 mg/dL — ABNORMAL HIGH (ref 70–99)
Glucose-Capillary: 129 mg/dL — ABNORMAL HIGH (ref 70–99)
Glucose-Capillary: 296 mg/dL — ABNORMAL HIGH (ref 70–99)

## 2022-12-13 NOTE — Progress Notes (Signed)
PROGRESS NOTE    Bill Jackson  ZOX:[id number] DOB: [birth year] DOA: 12/09/2022 PCP: Margit Hanks, MD (Inactive)   Brief Narrative: Bill Jackson is a 88 y.o. male with a history of stroke, diabetes, syncope, hypertension, low back pain, glaucoma, neuropathy, GERD.  Patient presents secondary to having a fall and being found down.  On admission he is found to have evidence of a right eighth rib fracture in addition to elevated troponin concern for possible NSTEMI.  Patient also was found to have multiple electrolyte abnormalities and was started on IV fluids in addition to electrolyte supplementation.  Cardiology was consulted for elevated troponin and ongoing evaluation.  Echocardiogram revealed reduced LVEF of 30% with cardiology assessment of Takotsubo cardiomyopathy and associated demand ischemia.  During admission, patient developed transition of rhythm to atrial fibrillation with associated rapid ventricular rate.  Rate controlled eventually with metoprolol, digoxin, amiodarone.  Patient converted back to sinus rhythm.  Hospitalization also complicated by confusion/delirium which has resolved.   Assessment and Plan:  Rib fracture Secondary to fall involving the right eighth rib. Pain is improving. -Pain management  Syncope and collapse Unclear etiology. Patient states he has been having falls since he had his stroke several years ago. He reports passing out, however. Unclear if this is cardiac related as patient was noted to have a short run of V-tach. Transthoracic Echocardiogram significant for reduced LVEF of 30%. Patient developed atrial fibrillation during hospitalization witch conversion back to sinus rhythm. -Continue telemetry  Elevated troponin In setting of prolonged time down. Troponin peak of 3,838. Patient without reports of substernal chest pain. No dyspnea. Transthoracic Echocardiogram ordered. Cardiology consulted. -Continue telemetry -Cardiology recommendations:  demand ischemia, no ischemic workup  Acute combined systolic and diastolic heart failure Transthoracic Echocardiogram significant for an LVEF of 30%. Cardiology concerned for Takotsubo cardiomyopathy. Secondary to behavioral concerns, ischemic workup is deferred and recommendation for ongoing medical management. Coreg started. Plan for ARB/Entresto and spironolactone as renal function allows. Coreg decreased secondary to bradycardia.  Non-sustained bentricular tachycardia Non-sustained. Noted on telemetry. Complicated by patient's history of fall and syncope. Potassium and magnesium low on admission and are improved. -Continue telemetry  Paroxysmal atrial fibrillation with RVR Patient converted to atrial fibrillation with associated rapid heart rate on 5/9. Metoprolol given with resultant soft blood pressure. Cardiology started amiodarone and gave one dose of digoxin. Patient has converted back into sinus rhythm. Amiodarone continued.  Sinus bradycardia Likely related to beta-blocker. Heart rate as low as 40s. Patient is asymptomatic. Cardiology recommended to decrease Coreg.  Possible rhabdomyolysis CK increasing. Unclear if this is related to cardiac etiology vs traumatic rhabdomyolysis from fall and being down. Patient managed with IV fluids which are now discontinued secondary to developing pulmonary vascular congestion. CK trending down.  Confusion Possible delirium. Per sister, patient has had confusion for at least the last month. Current presentation is not acute. Urinalysis not suggestive of infection. TSH normal. Vitamin B12 normal. Confusion is improved this morning, but if he has underling cognitive impairment with sundowning, possible he will have recurrent episodes. For now, appears confusion is mostly resolved. -Delirium precautions  Hypernatremia Sodium of 150 on admission. Resolved with IV fluids but slightly worsened off of IV fluids. IV fluids restarted with repeat  resolution.  Hypokalemia Potassium of 3.2 on admission with a low of 3.0. Improved to 4.1 and is stable.  Diabetes mellitus, type II Patient is on Lantus and appears to be on a sliding scale of 10 to 20 units  daily based on blood sugar.  Blood sugars uncontrolled this admission. Hemoglobin A1c of 5.6%. -Continue sliding scale -Continue Semglee 10 units daily  Hypomagnesemia Magnesium of 1.4 on admission. Improved with repletion.  History of CVA Concern for acute stroke on admission. MRI ruled out acute stroke. -Continue aspirin  Primary hypertension Blood pressure uncontrolled.  Glaucoma -Continue latanoprost, Cosopt and brimonidine  Left upper pulmonary nodule Noted on imaging. Possibly infectious/inflammatory measuring 8 mm. Recommendation for follow-up CT chest without contrast in 4-6 weeks  DVT prophylaxis: Lovenox Code Status:   Code Status: DNR Family Communication: None at bedside. 5/12: Son via telephone x2, but no response Disposition Plan: Discharge pending SNF bed availability   Consultants:  Cardiology  Procedures:  5/9: Transthoracic Echocardiogram   Antimicrobials: None    Subjective: Patient without specific concerns today.  Objective: BP 106/64 (BP Location: Right Arm)   Pulse 60   Temp 98.2 F (36.8 C) (Oral)   Resp 20   Ht 6' (1.829 m)   Wt 83.9 kg   SpO2 100%   BMI 25.09 kg/m   Examination:  General exam: Appears calm and comfortable Respiratory system: Clear to auscultation. Respiratory effort normal. Cardiovascular system: S1 & S2 heard, RRR. No murmurs. Gastrointestinal system: Abdomen is nondistended, soft and nontender. Normal bowel sounds heard. Central nervous system: Alert. Musculoskeletal: No edema. No calf tenderness   Data Reviewed: I have personally reviewed following labs and imaging studies  CBC Lab Results  Component Value Date   WBC 6.7 12/10/2022   RBC 3.77 (L) 12/10/2022   HGB 13.2 12/10/2022   HCT 38.1  (L) 12/10/2022   MCV 101.1 (H) 12/10/2022   MCH 35.0 (H) 12/10/2022   PLT 235 12/10/2022   MCHC 34.6 12/10/2022   RDW 13.1 12/10/2022   LYMPHSABS 0.8 12/09/2022   MONOABS 0.9 12/09/2022   EOSABS 0.0 12/09/2022   BASOSABS 0.0 12/09/2022     Last metabolic panel Lab Results  Component Value Date   NA 143 12/12/2022   K 4.9 12/12/2022   CL 109 12/12/2022   CO2 26 12/12/2022   BUN 25 (H) 12/12/2022   CREATININE 1.38 (H) 12/12/2022   GLUCOSE 90 12/12/2022   GFRNONAA 50 (L) 12/12/2022   GFRAA 56 (L) 04/09/2018   CALCIUM 8.1 (L) 12/12/2022   PHOS 1.9 (L) 12/11/2022   PROT 6.0 (L) 12/11/2022   ALBUMIN 2.5 (L) 12/11/2022   BILITOT 1.0 12/11/2022   ALKPHOS 58 12/11/2022   AST 56 (H) 12/11/2022   ALT 25 12/11/2022   ANIONGAP 8 12/12/2022    GFR: Estimated Creatinine Clearance: 42.2 mL/min (A) (by C-G formula based on SCr of 1.38 mg/dL (H)).  No results found for this or any previous visit (from the past 240 hour(s)).    Radiology Studies: DG Swallowing Func-Speech Pathology  Result Date: 12/11/2022 Table formatting from the original result was not included. Modified Barium Swallow Study Patient Details Name: Bill Jackson MRN: [id number] Date of Birth: April 06, 1935 Today's Date: 12/11/2022 HPI/PMH: HPI: Patient is an 88 y.o. male with PMH: DM, CVA (with h/o dysphagia requiring modified diet and PEG), syncope, HTN, GERD, neuropathy He presented to the hospital on 12/09/22 following fall at home. (patient lives by himself but with nursing visits several times a week) Family was unable to reach him on the phone and they called EMS. In ED, patient's vitals were stable, CT head without acute intracranial abnormality, T of the chest abdomen and pelvis noting only eighth rib fracture on the right,  left upper lobe nodule of 8 mm, hepatic steatosis. SLP swallow evaluation ordered secondary to observed coughing with PO's by nursing. Clinical Impression: Clinical Impression: Pt demonstrates impaired  swallowing with sensed, moderately significant aspiration events of large nectar and thin sips before the swallow. When thin or nectar sips are smaller and controlled pt has instances of trace aspiration just below the vocal folds with ejection. A chin tuck with large sips also reduced severity of aspiration to trace quantities that are ejected. Puree and mechanical soft solids are tolerated with minimal pharyngeal residue. Pt does not appear to have significant weakness but there was inconsistent collections of mild residue in sulci. For now, pt to continue dys 2/honey thick, but would like therapists that f/u to engage pt and family in discussion about acceptance of risk with thin liquids and strategies that may reduce risk when possible. Pt says he hates honey thick liquids and is likely not consuming them at home anyway. Factors that may increase risk of adverse event in presence of aspiration Rubye Oaks & Clearance Coots 2021): Factors that may increase risk of adverse event in presence of aspiration Rubye Oaks & Clearance Coots 2021): Reduced cognitive function Recommendations/Plan: Swallowing Evaluation Recommendations Swallowing Evaluation Recommendations Recommendations: PO diet PO Diet Recommendation: Moderately thick liquids (Level 3, honey thick); Dysphagia 2 (Finely chopped) Medication Administration: Whole meds with puree Supervision: Staff to assist with self-feeding Swallowing strategies  : Slow rate; Small bites/sips Treatment Plan Treatment Plan Treatment recommendations: Therapy as outlined in treatment plan below Follow-up recommendations: Home health SLP Treatment frequency: Min 2x/week Treatment duration: 2 weeks Recommendations Recommendations for follow up therapy are one component of a multi-disciplinary discharge planning process, led by the attending physician.  Recommendations may be updated based on patient status, additional functional criteria and insurance authorization. Assessment: Orofacial Exam:  Orofacial Exam Oral Cavity - Dentition: Adequate natural dentition Anatomy: Anatomy: WFL Boluses Administered: Boluses Administered Boluses Administered: Thin liquids (Level 0); Mildly thick liquids (Level 2, nectar thick); Moderately thick liquids (Level 3, honey thick); Puree; Solid  Oral Impairment Domain: Oral Impairment Domain Lip Closure: No labial escape Tongue control during bolus hold: Escape to lateral buccal cavity/floor of mouth Bolus preparation/mastication: Slow prolonged chewing/mashing with complete recollection Bolus transport/lingual motion: Delayed initiation of tongue motion (oral holding) Oral residue: Trace residue lining oral structures Location of oral residue : Tongue; Floor of mouth Initiation of pharyngeal swallow : Pyriform sinuses  Pharyngeal Impairment Domain: Pharyngeal Impairment Domain Soft palate elevation: No bolus between soft palate (SP)/pharyngeal wall (PW) Laryngeal elevation: Complete superior movement of thyroid cartilage with complete approximation of arytenoids to epiglottic petiole Anterior hyoid excursion: Complete anterior movement Epiglottic movement: Complete inversion Laryngeal vestibule closure: Complete, no air/contrast in laryngeal vestibule Pharyngeal stripping wave : Present - complete Pharyngoesophageal segment opening: Complete distension and complete duration, no obstruction of flow Tongue base retraction: Trace column of contrast or air between tongue base and PPW Pharyngeal residue: Collection of residue within or on pharyngeal structures Location of pharyngeal residue: Valleculae; Pyriform sinuses  Esophageal Impairment Domain: No data recorded Pill: No data recorded Penetration/Aspiration Scale Score: Penetration/Aspiration Scale Score 1.  Material does not enter airway: Thin liquids (Level 0); Mildly thick liquids (Level 2, nectar thick); Moderately thick liquids (Level 3, honey thick); Puree; Solid 6.  Material enters airway, passes BELOW cords then  ejected out: Thin liquids (Level 0); Mildly thick liquids (Level 2, nectar thick) Compensatory Strategies: Compensatory Strategies Compensatory strategies: Yes Chin tuck: Effective Effective Chin Tuck: Thin liquid (Level 0); Mildly  thick liquid (Level 2, nectar thick) (trace aspiration only)   General Information: Caregiver present: No  Diet Prior to this Study: Dysphagia 1 (pureed); Moderately thick liquids (Level 3, honey thick)   Temperature : Normal   Respiratory Status: WFL   Supplemental O2: None (Room air)   No data recorded Behavior/Cognition: Alert; Cooperative; Pleasant mood Self-Feeding Abilities: Needs assist with self-feeding Baseline vocal quality/speech: Normal Volitional Cough: Able to elicit Volitional Swallow: Able to elicit No data recorded Goal Planning: Prognosis for improved oropharyngeal function: Fair No data recorded No data recorded Patient/Family Stated Goal: nothing specific No data recorded Pain: Pain Assessment Pain Assessment: No/denies pain End of Session: Start Time:SLP Start Time (ACUTE ONLY): 1330 Stop Time: SLP Stop Time (ACUTE ONLY): 1400 Time Calculation:SLP Time Calculation (min) (ACUTE ONLY): 30 min Charges: SLP Evaluations $ SLP Speech Visit: 1 Visit SLP Evaluations $BSS Swallow: 1 Procedure $MBS Swallow: 1 Procedure $Swallowing Treatment: 1 Procedure SLP visit diagnosis: SLP Visit Diagnosis: Dysphagia, oropharyngeal phase (R13.12) Past Medical History: Past Medical History: Diagnosis Date  Diabetes mellitus   Dysphagia following cerebrovascular accident   PEG tube  Hemorrhagic stroke (HCC) 12/10/2022  Hyperlipidemia   Hypertension   Left shoulder pain   Right knee pain   Stroke (HCC) 10/01/2012  R side weakness, slurred speech, dizziness, visual changes, ataxia; hemorrhagic cerebellar CVA, evacuated Past Surgical History: Past Surgical History: Procedure Laterality Date  CARDIAC SURGERY    CRANIECTOMY N/A 10/25/2012  Procedure: Suboccipital Craniectomy for Evacuation of  Cerebellar Hematoma;  Surgeon: Maeola Harman, MD;  Location: [facility];  Service: Neurosurgery;  Laterality: N/A;  Suboccipital Craniectomy for Evacuation of Cerebellar Hematoma  heart stents  x3  HERNIA REPAIR    PEG TUBE PLACEMENT    placed ?2014, changed 01/2014 (at [facility])  ROTATOR CUFF REPAIR   DeBlois, Riley Nearing 12/11/2022, 2:29 PM     LOS: 2 days    Jacquelin Hawking, MD [facility] 12/13/2022, 1:07 PM   If 7PM-7AM, please contact night-coverage [email or web address]

## 2022-12-14 DIAGNOSIS — I480 Paroxysmal atrial fibrillation: Secondary | ICD-10-CM | POA: Diagnosis not present

## 2022-12-14 DIAGNOSIS — I5020 Unspecified systolic (congestive) heart failure: Secondary | ICD-10-CM | POA: Diagnosis not present

## 2022-12-14 DIAGNOSIS — R7989 Other specified abnormal findings of blood chemistry: Secondary | ICD-10-CM | POA: Diagnosis not present

## 2022-12-14 DIAGNOSIS — Z8673 Personal history of transient ischemic attack (TIA), and cerebral infarction without residual deficits: Secondary | ICD-10-CM | POA: Diagnosis not present

## 2022-12-14 LAB — GLUCOSE, CAPILLARY
Glucose-Capillary: 127 mg/dL — ABNORMAL HIGH (ref 70–99)
Glucose-Capillary: 238 mg/dL — ABNORMAL HIGH (ref 70–99)
Glucose-Capillary: 231 mg/dL — ABNORMAL HIGH (ref 70–99)
Glucose-Capillary: 142 mg/dL — ABNORMAL HIGH (ref 70–99)
Glucose-Capillary: 196 mg/dL — ABNORMAL HIGH (ref 70–99)
Glucose-Capillary: 108 mg/dL — ABNORMAL HIGH (ref 70–99)

## 2022-12-14 LAB — VITAMIN B1: Vitamin B1 (Thiamine): 85.5 nmol/L (ref 66.5–200.0)

## 2022-12-14 MED ORDER — NICOTINE 14 MG/24HR TD PT24
14.0000 mg | MEDICATED_PATCH | Freq: Every day | TRANSDERMAL | Status: DC
  Administered 2022-12-14 – 2022-12-16 (×3): 14 mg via TRANSDERMAL
  Filled 2022-12-14 (×3): qty 1

## 2022-12-14 NOTE — [place] FL2 (Signed)
[place] MEDICAID FL2 LEVEL OF CARE FORM     IDENTIFICATION  Patient Name: Bill Jackson Birthdate: 07/10/1935 Sex: male Admission Date (Current Location): 12/09/2022  [facility] and [place] Number:  Producer, television/film/video and Address:  [facility], [address], [place], [place] [address]      Provider Number: [id number]  Attending Physician Name and Address:  Narda Bonds, MD  Relative Name and Phone Number:       Current Level of Care: Hospital Recommended Level of Care: Skilled Nursing Facility Prior Approval Number:    Date Approved/Denied:   PASRR Number: [id number]  Discharge Plan: SNF    Current Diagnoses: Patient Active Problem List   Diagnosis Date Noted   PAF (paroxysmal atrial fibrillation) (HCC) 12/11/2022   HFrEF (heart failure with reduced ejection fraction) (HCC) 12/10/2022   Diabetic neuropathy (HCC) 12/09/2022   Esophageal reflux 12/09/2022   Fall 12/09/2022   Elevated troponin 12/09/2022   Cerebellar Hemorrhagic stroke (HCC) 10/02/2022   Cellulitis of buttock 10/30/2020   Left shoulder pain 07/11/2013   Protein-calorie malnutrition, severe (HCC) 06/12/2013   Syncope 06/11/2013   Right shoulder pain 11/17/2012   Right sided weakness 11/17/2012   Low back pain 11/15/2012   Glaucoma 11/03/2012   Headache(784.0) 10/29/2012   Cough 10/29/2012   OA (osteoarthritis) of knee-right 10/28/2012   Accelerated hypertension 10/28/2012   Hypokalemia 10/28/2012   Acute cerebellar hemorrhage (HCC) 10/28/2012   Encounter for long-term (current) use of medications 10/28/2012   Type 2 diabetes mellitus (HCC) 10/25/2012   Hypertension 10/25/2012    Orientation RESPIRATION BLADDER Height & Weight     Self, Situation, Place  Normal External catheter Weight: 185 lb (83.9 kg) Height:  6' (182.9 cm)  BEHAVIORAL SYMPTOMS/MOOD NEUROLOGICAL BOWEL NUTRITION STATUS      Incontinent Diet (see d/c summary)  AMBULATORY STATUS COMMUNICATION  OF NEEDS Skin   Extensive Assist Verbally Normal                       Personal Care Assistance Level of Assistance  Bathing, Feeding, Dressing Bathing Assistance: Limited assistance Feeding assistance: Independent Dressing Assistance: Limited assistance     Functional Limitations Info  Sight, Speech, Hearing Sight Info: Impaired Hearing Info: Impaired Speech Info: Adequate    SPECIAL CARE FACTORS FREQUENCY  PT (By licensed PT) Blood Pressure Frequency: 5x/week   PT Frequency: 5x/week              Contractures Contractures Info: Not present    Additional Factors Info  Code Status, Allergies Code Status Info: DNR Allergies Info: Colestipol, Ezetimibe, Hydrocodone-acetaminophen, Lisinopril, Lovastatin, Penicillins, Lotensin (benazepril Hcl), Simvastatin           Current Medications (12/14/2022):  This is the current hospital active medication list Current Facility-Administered Medications  Medication Dose Route Frequency Provider Last Rate Last Admin   acetaminophen (TYLENOL) tablet 650 mg  650 mg Oral Q6H PRN Synetta Fail, MD   650 mg at 12/13/22 2126   Or   acetaminophen (TYLENOL) suppository 650 mg  650 mg Rectal Q6H PRN Synetta Fail, MD       amiodarone (NEXTERONE PREMIX) 360-4.14 MG/200ML-% (1.8 mg/mL) IV infusion  30 mg/hr Intravenous Continuous Marcelino Duster, PA   Stopped at 12/11/22 1404   amiodarone (PACERONE) tablet 200 mg  200 mg Oral BID Chilton Si, MD   200 mg at 12/14/22 1005   [START ON 12/25/2022] amiodarone (PACERONE) tablet  200 mg  200 mg Oral Daily Chilton Si, MD       aspirin chewable tablet 324 mg  324 mg Oral Once Synetta Fail, MD       brimonidine (ALPHAGAN) 0.2 % ophthalmic solution 1 drop  1 drop Both Eyes Q8H Synetta Fail, MD   1 drop at 12/14/22 1325   carvedilol (COREG) tablet 6.25 mg  6.25 mg Oral BID WC Azalee Course, PA   6.25 mg at 12/14/22 1005   dorzolamide-timolol (COSOPT) 2-0.5 %  ophthalmic solution 1 drop  1 drop Both Eyes BID Synetta Fail, MD   1 drop at 12/14/22 1005   enoxaparin (LOVENOX) injection 40 mg  40 mg Subcutaneous Q24H Synetta Fail, MD   40 mg at 12/13/22 2028   guaiFENesin (MUCINEX) 12 hr tablet 1,200 mg  1,200 mg Oral BID Narda Bonds, MD   1,200 mg at 12/14/22 1005   insulin aspart (novoLOG) injection 0-15 Units  0-15 Units Subcutaneous TID WC Narda Bonds, MD   5 Units at 12/14/22 1324   insulin glargine-yfgn (SEMGLEE) injection 10 Units  10 Units Subcutaneous Daily Narda Bonds, MD   10 Units at 12/14/22 1004   latanoprost (XALATAN) 0.005 % ophthalmic solution 1 drop  1 drop Both Eyes QHS Synetta Fail, MD   1 drop at 12/13/22 2122   polyethylene glycol (MIRALAX / GLYCOLAX) packet 17 g  17 g Oral Daily PRN Synetta Fail, MD   17 g at 12/14/22 1005   sodium chloride flush (NS) 0.9 % injection 3 mL  3 mL Intravenous Q12H Synetta Fail, MD   3 mL at 12/14/22 1006     Discharge Medications: Please see discharge summary for a list of discharge medications.  Relevant Imaging Results:  Relevant Lab Results:   Additional Information SSN [id number] [place], [place]

## 2022-12-14 NOTE — Care Management Important Message (Signed)
Important Message  [name] MRN: [id number] Date of Birth: 09-23-1934   Medicare Important Message Given:  Yes     Sherilyn Banker 12/14/2022, 12:25 PM

## 2022-12-14 NOTE — Evaluation (Signed)
Occupational Therapy Evaluation Patient Details Name: Bill Jackson MRN: [id number] DOB: 08/19/1934 Today's Date: 12/14/2022   History of Present Illness 88 y.o. male presenting 12/09/22 after fall at home. +rt 8th rib fx; dehydration; Cardiology consult due to elevated troponins--acute HF with EF 30%, demand ischemia. Confusion  PMH significant of diabetes, stroke (residual rt sided weakness) syncope, hypertension, low back pain, glaucoma, neuropathy, GERD   Clinical Impression   Prior to this admission, patient living alone, however unable to state any information for OT and all information gleaned from PT eval. Patient only oriented to self in session, with garbled speech throughout eval. Patient mod A to come into sitting EOB, and eating lunch with min A. Coughing noted after eating ice cream. Patient max A for ADLs, and max A of 2 to come into standing, significant posterior lean in standing, unable to advance gait to take steps. OT recommending therapy < 3 hours due to current level of function. OT will continue to follow.      Recommendations for follow up therapy are one component of a multi-disciplinary discharge planning process, led by the attending physician.  Recommendations may be updated based on patient status, additional functional criteria and insurance authorization.   Assistance Recommended at Discharge Frequent or constant Supervision/Assistance  Patient can return home with the following Two people to help with walking and/or transfers;A lot of help with bathing/dressing/bathroom;Assistance with cooking/housework;Assistance with feeding;Direct supervision/assist for financial management;Direct supervision/assist for medications management;Assist for transportation;Help with stairs or ramp for entrance    Functional Status Assessment  Patient has had a recent decline in their functional status and demonstrates the ability to make significant improvements in function in a  reasonable and predictable amount of time.  Equipment Recommendations   (defer to next venue)    Recommendations for Other Services       Precautions / Restrictions Precautions Precautions: Fall Precaution Comments: pt endorses multiple falls at home Restrictions Weight Bearing Restrictions: No      Mobility Bed Mobility Overal bed mobility: Needs Assistance Bed Mobility: Supine to Sit, Sit to Supine     Supine to sit: Mod assist Sit to supine: Mod assist   General bed mobility comments: pt attempting supine to sit to his right; partial roll (because of rib fx on rt) and assist to raise his torso (pt took legs over EOB himself); once upright able to scoot out to EOB with minguard, max cues to transition back to bed, minimal sidelying position, assist to return legs back to bed    Transfers Overall transfer level: Needs assistance Equipment used: 2 person hand held assist Transfers: Sit to/from Stand Sit to Stand: Max assist, +2 safety/equipment, +2 physical assistance           General transfer comment: max A of 2 to come into standing, significant posterior lean in standing, unable to advance gait to take steps      Balance Overall balance assessment: History of Falls, Needs assistance Sitting-balance support: No upper extremity supported, Feet supported Sitting balance-Leahy Scale: Fair     Standing balance support: Bilateral upper extremity supported, During functional activity, Reliant on assistive device for balance Standing balance-Leahy Scale: Zero Standing balance comment: unable to maintain standing balance without external assistt=                           ADL either performed or assessed with clinical judgement   ADL Overall ADL's : Needs assistance/impaired Eating/Feeding:  Minimal assistance Eating/Feeding Details (indicate cue type and reason): sitting EOB eating ice cream Grooming: Minimal assistance;Wash/dry hands;Wash/dry face    Upper Body Bathing: Moderate assistance;Sitting   Lower Body Bathing: Total assistance;Sitting/lateral leans;Sit to/from stand   Upper Body Dressing : Moderate assistance;Sitting   Lower Body Dressing: Total assistance;Bed level   Toilet Transfer: Maximal assistance;+2 for physical assistance;+2 for safety/equipment;Cueing for sequencing;Cueing for safety Toilet Transfer Details (indicate cue type and reason): sit<>stand wit RN, signficant posterior lean when in standing, unable to take steps Toileting- Clothing Manipulation and Hygiene: Total assistance;Sit to/from stand;Sitting/lateral lean       Functional mobility during ADLs: Maximal assistance;Cueing for safety;Cueing for sequencing General ADL Comments: Patient presenting with confusion, decreased activity tolerance, and need for significant increased assist to complete all ADLs and functional mobility.     Vision Baseline Vision/History: 1 Wears glasses Patient Visual Report: No change from baseline Additional Comments: Patient unable to follow commands to complete assessment, unsure if he wears glasses or not     Perception     Praxis      Pertinent Vitals/Pain Pain Assessment Pain Assessment: Faces Faces Pain Scale: No hurt     Hand Dominance     Extremity/Trunk Assessment Upper Extremity Assessment Upper Extremity Assessment: Generalized weakness   Lower Extremity Assessment Lower Extremity Assessment: Defer to PT evaluation   Cervical / Trunk Assessment Cervical / Trunk Assessment: Kyphotic   Communication Communication Communication: Expressive difficulties;HOH (slurred, garbled (at times unintelligible))   Cognition Arousal/Alertness: Awake/alert Behavior During Therapy: Flat affect Overall Cognitive Status: No family/caregiver present to determine baseline cognitive functioning                                 General Comments: Patient oriented to self only, unable to answer any  questions about home set up,     General Comments  coughing in session, garbled speech, RN aware    Exercises     Shoulder Instructions      Home Living Family/patient expects to be discharged to:: Unsure Living Arrangements: Alone Available Help at Discharge: Family;Personal care attendant (aide MWF) Type of Home: House Home Access: Ramped entrance     Home Layout: One level     Bathroom Shower/Tub: Walk-in shower;Door;Sponge bathes at baseline         Home Equipment: Rollator (4 wheels);Grab bars - tub/shower   Additional Comments:  (Information pulled through from PT eval, patient only responding to name in session, garbled speech throughout)      Prior Functioning/Environment Prior Level of Function : Independent/Modified Independent;Patient poor historian/Family not available;History of Falls (last six months)             Mobility Comments: reports he walks with a rollator ADLs Comments: unsure        OT Problem List: Decreased strength;Decreased range of motion;Decreased activity tolerance;Impaired balance (sitting and/or standing);Impaired vision/perception;Decreased coordination;Decreased cognition;Decreased safety awareness;Decreased knowledge of use of DME or AE;Decreased knowledge of precautions      OT Treatment/Interventions: Self-care/ADL training;Therapeutic exercise;Neuromuscular education;Energy conservation;DME and/or AE instruction;Manual therapy;Therapeutic activities;Visual/perceptual remediation/compensation;Cognitive remediation/compensation;Patient/family education;Balance training    OT Goals(Current goals can be found in the care plan section) Acute Rehab OT Goals Patient Stated Goal: unable OT Goal Formulation: Patient unable to participate in goal setting Time For Goal Achievement: 12/28/22 Potential to Achieve Goals: Fair ADL Goals Pt Will Perform Lower Body Bathing: with mod assist;sitting/lateral leans;sit to/from stand Pt Will  Perform Lower Body Dressing: with mod assist;sitting/lateral leans;sit to/from stand Pt Will Transfer to Toilet: with mod assist;bedside commode Pt Will Perform Toileting - Clothing Manipulation and hygiene: with mod assist;sitting/lateral leans;sit to/from stand Additional ADL Goal #1: Patient will be able to follow 1-2 step commands consistently in order to return to prior level of function and ADL independence.  OT Frequency: Min 2X/week    Co-evaluation              AM-PAC OT "6 Clicks" Daily Activity     Outcome Measure Help from another person eating meals?: A Little Help from another person taking care of personal grooming?: A Little Help from another person toileting, which includes using toliet, bedpan, or urinal?: Total Help from another person bathing (including washing, rinsing, drying)?: A Lot Help from another person to put on and taking off regular upper body clothing?: A Lot Help from another person to put on and taking off regular lower body clothing?: Total 6 Click Score: 12   End of Session Equipment Utilized During Treatment: Gait belt Nurse Communication: Mobility status  Activity Tolerance: Patient limited by fatigue;Patient limited by lethargy Patient left: in bed;with call bell/phone within reach;with bed alarm set  OT Visit Diagnosis: Unsteadiness on feet (R26.81);Other abnormalities of gait and mobility (R26.89);Repeated falls (R29.6);Muscle weakness (generalized) (M62.81);History of falling (Z91.81);Other symptoms and signs involving the nervous system (R29.898);Other symptoms and signs involving cognitive function                Time: 0981-1914 OT Time Calculation (min): 23 min Charges:  OT General Charges $OT Visit: 1 Visit OT Evaluation $OT Eval Moderate Complexity: 1 Mod OT Treatments $Self Care/Home Management : 8-22 mins  Pollyann Glen E. Harlowe Dowler, OTR/L Acute Rehabilitation Services [phone]   Cherlyn Cushing 12/14/2022, 2:59 PM

## 2022-12-14 NOTE — TOC Initial Note (Signed)
Transition of Care [facility]) - Initial/Assessment Note    [name] MRN: [id number] Date of Birth: 12/26/1934  [facility]) CM/SW Contact:    Erin Sons, LCSW Phone Number: 12/14/2022, 2:03 PM  Clinical Narrative:                  CSW met with pt to discuss disposition/therapy recs. Pt lives at home alone. He is agreeable to SNF w/u. CSW explained SNF w/u and insurance auth processes. Pt consents for CSW to speak with pt's daughter, Misty Stanley, listed in chart. Fl2 completed and bed requests sent in hub.   Expected Discharge Plan: Skilled Nursing Facility Barriers to Discharge: Continued Medical Work up   Patient Goals and CMS Choice Patient states their goals for this hospitalization and ongoing recovery are:: agreeable to SNF          Expected Discharge Plan and Services       Living arrangements for the past 2 months: Single Family Home                                      Prior Living Arrangements/Services Living arrangements for the past 2 months: Single Family Home Lives with:: Self Patient language and need for interpreter reviewed:: Yes        Need for Family Participation in Patient Care: No (Comment) Care giver support system in place?: No (comment)   Criminal Activity/Legal Involvement Pertinent to Current Situation/Hospitalization: No - Comment as needed  Activities of Daily Living Home Assistive Devices/Equipment: Walker (specify type) ADL Screening (condition at time of admission) Patient's cognitive ability adequate to safely complete daily activities?: Yes Is the patient deaf or have difficulty hearing?: Yes Does the patient have difficulty seeing, even when wearing glasses/contacts?: No Does the patient have difficulty concentrating, remembering, or making decisions?: No Patient able to express need for assistance with ADLs?: Yes Does the patient have difficulty dressing or bathing?: Yes Independently  performs ADLs?: No Communication: Independent Dressing (OT): Needs assistance Is this a change from baseline?: Pre-admission baseline Grooming: Needs assistance Is this a change from baseline?: Pre-admission baseline Feeding: Needs assistance (set up) Is this a change from baseline?: Pre-admission baseline Bathing: Needs assistance Is this a change from baseline?: Pre-admission baseline Toileting: Needs assistance Is this a change from baseline?: Pre-admission baseline In/Out Bed: Needs assistance Is this a change from baseline?: Pre-admission baseline Walks in Home: Needs assistance Is this a change from baseline?: Pre-admission baseline Does the patient have difficulty walking or climbing stairs?: Yes Weakness of Legs: Both Weakness of Arms/Hands: None  Permission Sought/Granted   Permission granted to share information with : Yes, Verbal Permission Granted  Share Information with NAME: Daughter Misty Stanley           Emotional Assessment Appearance:: Appears stated age Attitude/Demeanor/Rapport: Engaged Affect (typically observed): Accepting Orientation: : Oriented to Self, Oriented to Situation, Oriented to Place Alcohol / Substance Use: Not Applicable Psych Involvement: No (comment)  Admission diagnosis:  Fall [W19.XXXA] Lung nodule [R91.1] Closed fracture of one rib of right side, initial encounter [S22.31XA] Patient Active Problem List   Diagnosis Date Noted   PAF (paroxysmal atrial fibrillation) (HCC) 12/11/2022   HFrEF (heart failure with reduced ejection fraction) (HCC) 12/10/2022   Diabetic neuropathy (HCC) 12/09/2022   Esophageal reflux 12/09/2022   Fall 12/09/2022   Elevated troponin 12/09/2022   Cerebellar Hemorrhagic stroke (  HCC) 10/02/2022   Cellulitis of buttock 10/30/2020   Left shoulder pain 07/11/2013   Protein-calorie malnutrition, severe (HCC) 06/12/2013   Syncope 06/11/2013   Right shoulder pain 11/17/2012   Right sided weakness 11/17/2012   Low  back pain 11/15/2012   Glaucoma 11/03/2012   Headache(784.0) 10/29/2012   Cough 10/29/2012   OA (osteoarthritis) of knee-right 10/28/2012   Accelerated hypertension 10/28/2012   Hypokalemia 10/28/2012   Acute cerebellar hemorrhage (HCC) 10/28/2012   Encounter for long-term (current) use of medications 10/28/2012   Type 2 diabetes mellitus (HCC) 10/25/2012   Hypertension 10/25/2012   PCP:  Margit Hanks, MD (Inactive) Pharmacy:   [facility] [phone] - [place], [place] - [address] AT [facility] OF [address] [facility] [address] [place] [address] Phone: [phone] Fax: [phone]     Social Determinants of Health (SDOH) Social History: SDOH Screenings   Food Insecurity: No Food Insecurity (12/09/2022)  Housing: Low Risk  (12/09/2022)  Transportation Needs: No Transportation Needs (12/09/2022)  Utilities: Not At Risk (12/09/2022)  Tobacco Use: High Risk (12/10/2022)   SDOH Interventions:     Readmission Risk Interventions     No data to display

## 2022-12-14 NOTE — Progress Notes (Signed)
PROGRESS NOTE    Bill Jackson  WUJ:[id number] DOB: 20-Nov-1934 DOA: 12/09/2022 PCP: Margit Hanks, MD (Inactive)   Brief Narrative: Bill Jackson is a 88 y.o. male with a history of stroke, diabetes, syncope, hypertension, low back pain, glaucoma, neuropathy, GERD.  Patient presents secondary to having a fall and being found down.  On admission he is found to have evidence of a right eighth rib fracture in addition to elevated troponin concern for possible NSTEMI.  Patient also was found to have multiple electrolyte abnormalities and was started on IV fluids in addition to electrolyte supplementation.  Cardiology was consulted for elevated troponin and ongoing evaluation.  Echocardiogram revealed reduced LVEF of 30% with cardiology assessment of Takotsubo cardiomyopathy and associated demand ischemia.  During admission, patient developed transition of rhythm to atrial fibrillation with associated rapid ventricular rate.  Rate controlled eventually with metoprolol, digoxin, amiodarone.  Patient converted back to sinus rhythm.  Hospitalization also complicated by confusion/delirium which has resolved.   Assessment and Plan:  Rib fracture Secondary to fall involving the right eighth rib. Pain is improving. -Pain management  Syncope and collapse Unclear etiology. Patient states he has been having falls since he had his stroke several years ago. He reports passing out, however. Unclear if this is cardiac related as patient was noted to have a short run of V-tach. Transthoracic Echocardiogram significant for reduced LVEF of 30%. Patient developed atrial fibrillation during hospitalization witch conversion back to sinus rhythm. -Continue telemetry  Elevated troponin In setting of prolonged time down. Troponin peak of 3,838. Patient without reports of substernal chest pain. No dyspnea. Transthoracic Echocardiogram ordered. Cardiology consulted. -Continue telemetry -Cardiology recommendations:  demand ischemia, no ischemic workup  Acute combined systolic and diastolic heart failure Transthoracic Echocardiogram significant for an LVEF of 30%. Cardiology concerned for Takotsubo cardiomyopathy. Secondary to behavioral concerns, ischemic workup is deferred and recommendation for ongoing medical management. Coreg started. Plan for ARB/Entresto and spironolactone as renal function allows. Coreg decreased secondary to bradycardia.  Non-sustained bentricular tachycardia Non-sustained. Noted on telemetry. Complicated by patient's history of fall and syncope. Potassium and magnesium low on admission and are improved. -Continue telemetry  Paroxysmal atrial fibrillation with RVR Patient converted to atrial fibrillation with associated rapid heart rate on 5/9. Metoprolol given with resultant soft blood pressure. Cardiology started amiodarone and gave one dose of digoxin. Patient has converted back into sinus rhythm. Amiodarone continued. -Cardiology recommendations: amiodarone 200 mg BID x14 days followed by amiodarone 200 mg daily  Sinus bradycardia Likely related to beta-blocker. Heart rate as low as 40s. Patient is asymptomatic. Cardiology recommended to decrease Coreg. Improved.  Possible rhabdomyolysis CK increasing. Unclear if this is related to cardiac etiology vs traumatic rhabdomyolysis from fall and being down. Patient managed with IV fluids which are now discontinued secondary to developing pulmonary vascular congestion. CK trending down.  Confusion Possible delirium. Per sister, patient has had confusion for at least the last month. Current presentation is not acute. Urinalysis not suggestive of infection. TSH normal. Vitamin B12 normal. Confusion is improved this morning, but if he has underling cognitive impairment with sundowning, possible he will have recurrent episodes. For now, appears confusion is mostly resolved. -Delirium precautions  Hypernatremia Sodium of 150 on  admission. Resolved with IV fluids but slightly worsened off of IV fluids. IV fluids restarted with repeat resolution.  Hypokalemia Potassium of 3.2 on admission with a low of 3.0. Improved to 4.1 and is stable.  Diabetes mellitus, type II Patient is  on Lantus and appears to be on a sliding scale of 10 to 20 units daily based on blood sugar.  Blood sugars uncontrolled this admission. Hemoglobin A1c of 5.6%. -Continue sliding scale -Continue Semglee 10 units daily  Hypomagnesemia Magnesium of 1.4 on admission. Improved with repletion.  History of CVA Concern for acute stroke on admission. MRI ruled out acute stroke. -Continue aspirin  Primary hypertension Blood pressure uncontrolled.  Glaucoma -Continue latanoprost, Cosopt and brimonidine  Left upper pulmonary nodule Noted on imaging. Possibly infectious/inflammatory measuring 8 mm. Recommendation for follow-up CT chest without contrast in 4-6 weeks  DVT prophylaxis: Lovenox Code Status:   Code Status: DNR Family Communication: Daughter via telephone Disposition Plan: Discharge pending SNF bed availability   Consultants:  Cardiology  Procedures:  5/9: Transthoracic Echocardiogram   Antimicrobials: None    Subjective: Patient without specific concerns from overnight or this morning. No chest pain or dyspnea..  Objective: BP 128/84 (BP Location: Right Arm)   Pulse 70   Temp 98.3 F (36.8 C) (Oral)   Resp 20   Ht 6' (1.829 m)   Wt 83.9 kg   SpO2 92%   BMI 25.09 kg/m   Examination:  General exam: Appears calm and comfortable Respiratory system: Rhonchi (transmitted). Respiratory effort normal. Cardiovascular system: S1 & S2 heard, RRR. Gastrointestinal system: Abdomen is nondistended, soft and nontender. Normal bowel sounds heard. Central nervous system: Alert and oriented. Dysarthria. Psychiatry: Judgement and insight appear normal. Mood & affect appropriate.    Data Reviewed: I have personally reviewed  following labs and imaging studies  CBC Lab Results  Component Value Date   WBC 6.7 12/10/2022   RBC 3.77 (L) 12/10/2022   HGB 13.2 12/10/2022   HCT 38.1 (L) 12/10/2022   MCV 101.1 (H) 12/10/2022   MCH 35.0 (H) 12/10/2022   PLT 235 12/10/2022   MCHC 34.6 12/10/2022   RDW 13.1 12/10/2022   LYMPHSABS 0.8 12/09/2022   MONOABS 0.9 12/09/2022   EOSABS 0.0 12/09/2022   BASOSABS 0.0 12/09/2022     Last metabolic panel Lab Results  Component Value Date   NA 143 12/12/2022   K 4.9 12/12/2022   CL 109 12/12/2022   CO2 26 12/12/2022   BUN 25 (H) 12/12/2022   CREATININE 1.38 (H) 12/12/2022   GLUCOSE 90 12/12/2022   GFRNONAA 50 (L) 12/12/2022   GFRAA 56 (L) 04/09/2018   CALCIUM 8.1 (L) 12/12/2022   PHOS 1.9 (L) 12/11/2022   PROT 6.0 (L) 12/11/2022   ALBUMIN 2.5 (L) 12/11/2022   BILITOT 1.0 12/11/2022   ALKPHOS 58 12/11/2022   AST 56 (H) 12/11/2022   ALT 25 12/11/2022   ANIONGAP 8 12/12/2022    GFR: Estimated Creatinine Clearance: 42.2 mL/min (A) (by C-G formula based on SCr of 1.38 mg/dL (H)).  No results found for this or any previous visit (from the past 240 hour(s)).    Radiology Studies: No results found.    LOS: 3 days    Jacquelin Hawking, MD [facility] 12/14/2022, 7:36 AM   If 7PM-7AM, please contact night-coverage [email or web address]

## 2022-12-14 NOTE — Progress Notes (Signed)
Speech Language Pathology Treatment: Dysphagia  Patient Details Name: Bill Jackson MRN: [id number] DOB: [birth year] Today's Date: 12/14/2022 Time: 1050-1110 SLP Time Calculation (min) (ACUTE ONLY): 20 min  Assessment / Plan / Recommendation Clinical Impression  Pt demonstrates consistent coughing with thin liquids even if taking very small sips. Tolerated honey thick liquids without assist or intervention. Called daughter to discuss results of MBS and options for diet modification. Daughter very much in favor of using thickened liquids at this time. She reports pt does eat some soft foods at home. Will upgrade to dys 2 to trial a ground texture. SLP encouraged daughter to advocate for liquid upgrade in the future when pt is more stable and potentially mobile under further SLP care. . Prolonged use of thickened liquids is not recommended.   HPI HPI: Patient is an 88 y.o. male with PMH: DM, CVA (with h/o dysphagia requiring modified diet and PEG), syncope, HTN, GERD, neuropathy He presented to the hospital on 12/09/22 following fall at home. (patient lives by himself but with nursing visits several times a week) Family was unable to reach him on the phone and they called EMS. In ED, patient's vitals were stable, CT head without acute intracranial abnormality, T of the chest abdomen and pelvis noting only eighth rib fracture on the right, left upper lobe nodule of 8 mm, hepatic steatosis. SLP swallow evaluation ordered secondary to observed coughing with PO's by nursing.      SLP Plan  Continue current plan of care      Recommendations for follow up therapy are one component of a multi-disciplinary discharge planning process, led by the attending physician.  Recommendations may be updated based on patient status, additional functional criteria and insurance authorization.    Recommendations  Diet recommendations: Honey-thick liquid;Dysphagia 2 (fine chop) Liquids provided via: Cup;Straw Medication  Administration: Whole meds with liquid Supervision: Patient able to self feed Compensations: Small sips/bites;Slow rate Postural Changes and/or Swallow Maneuvers: Seated upright 90 degrees                  Oral care BID     Dysphagia, oropharyngeal phase (R13.12)     All goals met     Maudene Stotler, Riley Nearing  12/14/2022, 1:06 PM

## 2022-12-15 DIAGNOSIS — I480 Paroxysmal atrial fibrillation: Secondary | ICD-10-CM | POA: Diagnosis not present

## 2022-12-15 DIAGNOSIS — I5020 Unspecified systolic (congestive) heart failure: Secondary | ICD-10-CM | POA: Diagnosis not present

## 2022-12-15 DIAGNOSIS — R7989 Other specified abnormal findings of blood chemistry: Secondary | ICD-10-CM | POA: Diagnosis not present

## 2022-12-15 DIAGNOSIS — Z8673 Personal history of transient ischemic attack (TIA), and cerebral infarction without residual deficits: Secondary | ICD-10-CM | POA: Diagnosis not present

## 2022-12-15 LAB — GLUCOSE, CAPILLARY
Glucose-Capillary: 188 mg/dL — ABNORMAL HIGH (ref 70–99)
Glucose-Capillary: 180 mg/dL — ABNORMAL HIGH (ref 70–99)
Glucose-Capillary: 204 mg/dL — ABNORMAL HIGH (ref 70–99)
Glucose-Capillary: 187 mg/dL — ABNORMAL HIGH (ref 70–99)
Glucose-Capillary: 150 mg/dL — ABNORMAL HIGH (ref 70–99)

## 2022-12-15 MED ORDER — MELATONIN 5 MG PO TABS
5.0000 mg | ORAL_TABLET | Freq: Every day | ORAL | Status: DC
  Administered 2022-12-15: 5 mg via ORAL
  Filled 2022-12-15: qty 1

## 2022-12-15 NOTE — Progress Notes (Signed)
PROGRESS NOTE    Bill Jackson  FAO:[id number] DOB: 02/01/1935 DOA: 12/09/2022 PCP: Margit Hanks, MD (Inactive)   Brief Narrative: Bill Jackson is a 88 y.o. male with a history of stroke, diabetes, syncope, hypertension, low back pain, glaucoma, neuropathy, GERD.  Patient presents secondary to having a fall and being found down.  On admission he is found to have evidence of a right eighth rib fracture in addition to elevated troponin concern for possible NSTEMI.  Patient also was found to have multiple electrolyte abnormalities and was started on IV fluids in addition to electrolyte supplementation.  Cardiology was consulted for elevated troponin and ongoing evaluation.  Echocardiogram revealed reduced LVEF of 30% with cardiology assessment of Takotsubo cardiomyopathy and associated demand ischemia.  During admission, patient developed transition of rhythm to atrial fibrillation with associated rapid ventricular rate.  Rate controlled eventually with metoprolol, digoxin, amiodarone.  Patient converted back to sinus rhythm.  Hospitalization also complicated by confusion/delirium which had resolved but has now recurred.   Assessment and Plan:  Rib fracture Secondary to fall involving the right eighth rib. Pain is improving. -Pain management  Syncope and collapse Unclear etiology. Patient states he has been having falls since he had his stroke several years ago. He reports passing out, however. Unclear if this is cardiac related as patient was noted to have a short run of V-tach. Transthoracic Echocardiogram significant for reduced LVEF of 30%. Patient developed atrial fibrillation during hospitalization witch conversion back to sinus rhythm. -Continue telemetry  Elevated troponin In setting of prolonged time down. Troponin peak of 3,838. Patient without reports of substernal chest pain. No dyspnea. Transthoracic Echocardiogram ordered. Cardiology consulted. -Continue  telemetry -Cardiology recommendations: demand ischemia, no ischemic workup  Acute combined systolic and diastolic heart failure Transthoracic Echocardiogram significant for an LVEF of 30%. Cardiology concerned for Takotsubo cardiomyopathy. Secondary to behavioral concerns, ischemic workup is deferred and recommendation for ongoing medical management. Coreg started. Plan for ARB/Entresto and spironolactone as renal function allows. Coreg decreased secondary to bradycardia.  Non-sustained bentricular tachycardia Non-sustained. Noted on telemetry. Complicated by patient's history of fall and syncope. Potassium and magnesium low on admission and are improved. -Continue telemetry  Paroxysmal atrial fibrillation with RVR Patient converted to atrial fibrillation with associated rapid heart rate on 5/9. Metoprolol given with resultant soft blood pressure. Cardiology started amiodarone and gave one dose of digoxin. Patient has converted back into sinus rhythm. Amiodarone continued. -Cardiology recommendations: amiodarone 200 mg BID x14 days followed by amiodarone 200 mg daily (reduced dose starting on 12/25/22)  Sinus bradycardia Likely related to beta-blocker. Heart rate as low as 40s. Patient is asymptomatic. Cardiology recommended to decrease Coreg. Improved.  Possible rhabdomyolysis CK increasing. Unclear if this is related to cardiac etiology vs traumatic rhabdomyolysis from fall and being down. Patient managed with IV fluids which are now discontinued secondary to developing pulmonary vascular congestion. CK trending down.  Confusion Possible delirium. Per sister, patient has had confusion for at least the last month. Current presentation is not acute. Urinalysis not suggestive of infection. TSH normal. Vitamin B12 normal. Confusion is improved this morning, but if he has underling cognitive impairment with sundowning, possible he will have recurrent episodes. For now, appears confusion is mostly  resolved but has now recurred. -Delirium precautions -Melatonin qHS  Hypernatremia Sodium of 150 on admission. Resolved with IV fluids but slightly worsened off of IV fluids. IV fluids restarted with repeat resolution.  Hypokalemia Potassium of 3.2 on admission with a low  of 3.0. Improved to 4.1 and is stable.  Diabetes mellitus, type II Patient is on Lantus and appears to be on a sliding scale of 10 to 20 units daily based on blood sugar.  Blood sugars uncontrolled this admission. Hemoglobin A1c of 5.6%. -Continue sliding scale -Continue Semglee 10 units daily  Hypomagnesemia Magnesium of 1.4 on admission. Improved with repletion.  History of CVA Concern for acute stroke on admission. MRI ruled out acute stroke. -Continue aspirin  Primary hypertension Blood pressure uncontrolled.  Glaucoma -Continue latanoprost, Cosopt and brimonidine  Left upper pulmonary nodule Noted on imaging. Possibly infectious/inflammatory measuring 8 mm. Recommendation for follow-up CT chest without contrast in 4-6 weeks  DVT prophylaxis: Lovenox Code Status:   Code Status: DNR Family Communication: Daughter via telephone Disposition Plan: Discharge pending SNF bed availability and pending improvement of confusion. Likely discharge in AM.   Consultants:  Cardiology  Procedures:  5/9: Transthoracic Echocardiogram   Antimicrobials: None    Subjective: Patient without specific concerns for me this morning.  Objective: BP 97/62 (BP Location: Right Arm)   Pulse 61   Temp 98 F (36.7 C) (Axillary)   Resp 19   Ht 6' (1.829 m)   Wt 83.9 kg   SpO2 98%   BMI 25.09 kg/m   Examination:  General exam: Appears calm and comfortable  Respiratory system: Clear to auscultation. Respiratory effort normal. Cardiovascular system: S1 & S2 heard, RRR. Gastrointestinal system: Abdomen is nondistended, soft and nontender. Normal bowel sounds heard. Central nervous system: Alert and oriented to  person and place. Dysarthria Musculoskeletal: No edema. No calf tenderness   Data Reviewed: I have personally reviewed following labs and imaging studies  CBC Lab Results  Component Value Date   WBC 6.7 12/10/2022   RBC 3.77 (L) 12/10/2022   HGB 13.2 12/10/2022   HCT 38.1 (L) 12/10/2022   MCV 101.1 (H) 12/10/2022   MCH 35.0 (H) 12/10/2022   PLT 235 12/10/2022   MCHC 34.6 12/10/2022   RDW 13.1 12/10/2022   LYMPHSABS 0.8 12/09/2022   MONOABS 0.9 12/09/2022   EOSABS 0.0 12/09/2022   BASOSABS 0.0 12/09/2022     Last metabolic panel Lab Results  Component Value Date   NA 143 12/12/2022   K 4.9 12/12/2022   CL 109 12/12/2022   CO2 26 12/12/2022   BUN 25 (H) 12/12/2022   CREATININE 1.38 (H) 12/12/2022   GLUCOSE 90 12/12/2022   GFRNONAA 50 (L) 12/12/2022   GFRAA 56 (L) 04/09/2018   CALCIUM 8.1 (L) 12/12/2022   PHOS 1.9 (L) 12/11/2022   PROT 6.0 (L) 12/11/2022   ALBUMIN 2.5 (L) 12/11/2022   BILITOT 1.0 12/11/2022   ALKPHOS 58 12/11/2022   AST 56 (H) 12/11/2022   ALT 25 12/11/2022   ANIONGAP 8 12/12/2022    GFR: Estimated Creatinine Clearance: 42.2 mL/min (A) (by C-G formula based on SCr of 1.38 mg/dL (H)).  No results found for this or any previous visit (from the past 240 hour(s)).    Radiology Studies: No results found.    LOS: 4 days    Jacquelin Hawking, MD [facility] 12/15/2022, 12:19 PM   If 7PM-7AM, please contact night-coverage [email or web address]

## 2022-12-15 NOTE — Progress Notes (Signed)
Physical Therapy Treatment Patient Details Name: Bill Jackson MRN: [id number] DOB: 1935-02-18 Today's Date: 12/15/2022   History of Present Illness 88 y.o. male presenting 12/09/22 after fall at home. +rt 8th rib fx; dehydration; Cardiology consult due to elevated troponins--acute HF with EF 30%, demand ischemia. Confusion  PMH significant of diabetes, stroke (residual rt sided weakness) syncope, hypertension, low back pain, glaucoma, neuropathy, GERD    PT Comments    Patient with great difficulty standing with OT 5/13 noted. Used stedy and elevated surface with pt progressing to standing with +2 min assist. Progressed to standing with rW from elevated bed and also with +2 min assist.     Recommendations for follow up therapy are one component of a multi-disciplinary discharge planning process, led by the attending physician.  Recommendations may be updated based on patient status, additional functional criteria and insurance authorization.  Follow Up Recommendations  Can patient physically be transported by private vehicle: No    Assistance Recommended at Discharge Frequent or constant Supervision/Assistance  Patient can return home with the following Two people to help with walking and/or transfers;Assistance with cooking/housework;Assistance with feeding;Direct supervision/assist for medications management;Direct supervision/assist for financial management;Assist for transportation;Help with stairs or ramp for entrance   Equipment Recommendations  None recommended by PT    Recommendations for Other Services       Precautions / Restrictions Precautions Precautions: Fall Precaution Comments: pt endorses multiple falls at home Restrictions Weight Bearing Restrictions: No     Mobility  Bed Mobility Overal bed mobility: Needs Assistance Bed Mobility: Rolling, Sidelying to Sit, Sit to Sidelying Rolling: Min assist (with rail; each direction for pad placement) Sidelying to sit:  Mod assist, HOB elevated (with rail)     Sit to sidelying: Mod assist, +2 for physical assistance General bed mobility comments: pt attempting supine to sit and unable to raise torso; educated in rolling to his side and then sit with success with assist    Transfers Overall transfer level: Needs assistance Equipment used: Rolling walker (2 wheels) (stedy) Transfers: Sit to/from Stand Sit to Stand: +2 physical assistance, Mod assist, Min assist, From elevated surface           General transfer comment: initial attempt to stand with bed at regular height required +2 mod assist (with stedy); pt stood from seat of stedy and from elevated bed with min assist; progressed to standing with RW x2 with bed elevated    Ambulation/Gait               General Gait Details: side steps toward HOB only; pt has been too confused for sitting up in the chair today (per RN request)   Stairs             Wheelchair Mobility    Modified Rankin (Stroke Patients Only)       Balance Overall balance assessment: History of Falls, Needs assistance Sitting-balance support: No upper extremity supported, Feet supported Sitting balance-Leahy Scale: Fair     Standing balance support: Bilateral upper extremity supported, During functional activity, Reliant on assistive device for balance Standing balance-Leahy Scale: Poor Standing balance comment: stood with light use of RW for up to 2 minutes;                            Cognition Arousal/Alertness: Awake/alert Behavior During Therapy: Flat affect Overall Cognitive Status: No family/caregiver present to determine baseline cognitive functioning  General Comments: delayed following one step commands        Exercises General Exercises - Lower Extremity Ankle Circles/Pumps: AAROM, Both, 10 reps Heel Slides: AAROM, Both, 5 reps Straight Leg Raises: AAROM, Strengthening, Both, 10  reps Other Exercises Other Exercises: bridging attempts x 3 with pt unable to lift hips off bed    General Comments General comments (skin integrity, edema, etc.): Reported dizziness after prolonged standing; seated BP 99/67; on return to supine 96/62      Pertinent Vitals/Pain Pain Assessment Pain Assessment: Faces Faces Pain Scale: No hurt    Home Living                          Prior Function            PT Goals (current goals can now be found in the care plan section) Acute Rehab PT Goals Patient Stated Goal: unable to state, agrees with PT goals to get him moving better Time For Goal Achievement: 12/26/22 Potential to Achieve Goals: Fair Progress towards PT goals: Progressing toward goals    Frequency    Min 2X/week      PT Plan Current plan remains appropriate    Co-evaluation              AM-PAC PT "6 Clicks" Mobility   Outcome Measure  Help needed turning from your back to your side while in a flat bed without using bedrails?: A Lot Help needed moving from lying on your back to sitting on the side of a flat bed without using bedrails?: A Lot Help needed moving to and from a bed to a chair (including a wheelchair)?: Total Help needed standing up from a chair using your arms (e.g., wheelchair or bedside chair)?: Total Help needed to walk in hospital room?: Total Help needed climbing 3-5 steps with a railing? : Total 6 Click Score: 8    End of Session Equipment Utilized During Treatment: Gait belt Activity Tolerance: Patient tolerated treatment well Patient left: with call bell/phone within reach;in bed;with bed alarm set Nurse Communication: Mobility status PT Visit Diagnosis: Unsteadiness on feet (R26.81);Repeated falls (R29.6);Muscle weakness (generalized) (M62.81)     Time: 1610-9604 PT Time Calculation (min) (ACUTE ONLY): 36 min  Charges:  $Gait Training: 8-22 mins $Therapeutic Exercise: 8-22 mins                      Jerolyn Center, PT Acute Rehabilitation Services  Office [phone]    Zena Amos 12/15/2022, 11:36 AM

## 2022-12-15 NOTE — TOC Progression Note (Signed)
Transition of Care [facility]) - Progression Note    Patient Details  Name: ELLISON SACHDEVA MRN: [id number] Date of Birth: 1934/11/09  Transition of Care [facility]) CM/SW Contact  Erin Sons, [place] Phone Number: 12/15/2022, 1:46 PM  Clinical Narrative:     CSW called pt's daughter, Misty Stanley, and updated her on SNF rec. Provided bed offers. She states pt was at [facility] in the past and she would prefer for him to go there.  CSW met with pt and provided bed offers. Pt agreeable to daughter's choice of [facility].   CSW confirmed with [facility] that they can admit tomorrow pending SNF auth.   CSW initiated SNF auth request in online portal; Status: pending  Expected Discharge Plan: Skilled Nursing Facility Barriers to Discharge: Continued Medical Work up  Expected Discharge Plan and Services       Living arrangements for the past 2 months: Single Family Home                                       Social Determinants of Health (SDOH) Interventions SDOH Screenings   Food Insecurity: No Food Insecurity (12/09/2022)  Housing: Low Risk  (12/09/2022)  Transportation Needs: No Transportation Needs (12/09/2022)  Utilities: Not At Risk (12/09/2022)  Tobacco Use: High Risk (12/10/2022)    Readmission Risk Interventions     No data to display

## 2022-12-16 DIAGNOSIS — E870 Hyperosmolality and hypernatremia: Secondary | ICD-10-CM | POA: Insufficient documentation

## 2022-12-16 DIAGNOSIS — K219 Gastro-esophageal reflux disease without esophagitis: Secondary | ICD-10-CM | POA: Diagnosis not present

## 2022-12-16 DIAGNOSIS — I5041 Acute combined systolic (congestive) and diastolic (congestive) heart failure: Secondary | ICD-10-CM | POA: Insufficient documentation

## 2022-12-16 DIAGNOSIS — S2249XA Multiple fractures of ribs, unspecified side, initial encounter for closed fracture: Secondary | ICD-10-CM | POA: Insufficient documentation

## 2022-12-16 DIAGNOSIS — G9341 Metabolic encephalopathy: Secondary | ICD-10-CM | POA: Insufficient documentation

## 2022-12-16 DIAGNOSIS — I4729 Other ventricular tachycardia: Secondary | ICD-10-CM | POA: Insufficient documentation

## 2022-12-16 DIAGNOSIS — R001 Bradycardia, unspecified: Secondary | ICD-10-CM | POA: Insufficient documentation

## 2022-12-16 DIAGNOSIS — W19XXXA Unspecified fall, initial encounter: Secondary | ICD-10-CM | POA: Diagnosis not present

## 2022-12-16 DIAGNOSIS — I214 Non-ST elevation (NSTEMI) myocardial infarction: Secondary | ICD-10-CM | POA: Insufficient documentation

## 2022-12-16 DIAGNOSIS — R55 Syncope and collapse: Secondary | ICD-10-CM | POA: Insufficient documentation

## 2022-12-16 DIAGNOSIS — M6282 Rhabdomyolysis: Secondary | ICD-10-CM | POA: Insufficient documentation

## 2022-12-16 LAB — GLUCOSE, CAPILLARY
Glucose-Capillary: 129 mg/dL — ABNORMAL HIGH (ref 70–99)
Glucose-Capillary: 178 mg/dL — ABNORMAL HIGH (ref 70–99)

## 2022-12-16 MED ORDER — AMIODARONE HCL 200 MG PO TABS: 200.0000 mg | ORAL_TABLET | Freq: Two times a day (BID) | ORAL | 0 refills | Status: DC

## 2022-12-16 MED ORDER — ASPIRIN 81 MG PO CHEW: 324.0000 mg | CHEWABLE_TABLET | Freq: Every day | ORAL | 0 refills | Status: DC

## 2022-12-16 MED ORDER — AMIODARONE HCL 200 MG PO TABS: 200.0000 mg | ORAL_TABLET | Freq: Every day | ORAL | Status: DC

## 2022-12-16 MED ORDER — AMIODARONE HCL 200 MG PO TABS: 200.0000 mg | ORAL_TABLET | Freq: Every day | ORAL | 0 refills | Status: DC

## 2022-12-16 MED ORDER — AMIODARONE HCL 200 MG PO TABS: 200.0000 mg | ORAL_TABLET | Freq: Every day | ORAL | 0 refills | Status: AC

## 2022-12-16 MED ORDER — CARVEDILOL 6.25 MG PO TABS: 6.2500 mg | ORAL_TABLET | Freq: Two times a day (BID) | ORAL | 0 refills | Status: DC

## 2022-12-16 NOTE — Discharge Summary (Addendum)
Physician Discharge Summary  Bill Jackson UJW:[id number] DOB: 12/26/1934 DOA: 12/09/2022  PCP: Margit Hanks, MD (Inactive)  Admit date: 12/09/2022 Discharge date: 12/16/2022  Admitted From: Home Disposition:   Recommendations for Outpatient Follow-up:  Follow up with PCP in 1 week Left upper pulmonary nodule: Noted on imaging. Possibly infectious/inflammatory measuring 8 mm. Recommendation for follow-up CT chest without contrast in 4-6 weeks  Discharge Condition: Stable CODE STATUS: DNR Diet recommendation:  Diet Orders (From admission, onward)     Start     Ordered   12/11/22 1429  DIET DYS 2 Room service appropriate? Yes; Fluid consistency: Honey Thick  Diet effective now       Question Answer Comment  Room service appropriate? Yes   Fluid consistency: Honey Thick      12/11/22 1428           Brief/Interim Summary: From H&P by Dr. Alinda Money: "Bill Jackson is a 88 y.o. male with medical history significant of diabetes, stroke, syncope, hypertension, low back pain, glaucoma, neuropathy, GERD, history of gastrostomy presenting after fall at home.   History obtained with assistance of chart review and family.  Patient lives alone and has nursing visits several times a week (on Monday Wednesday Friday through a service provided by the [place]).  Reportedly eats a pured diet due to his history of stroke and previously required a feeding tube.    At some point overnight, patient believes around 10 PM but family thinks earlier due to being in his daytime close, patient fell on the floor and remained there until 12 PM today.  Family was unable to reach patient by phone and called EMS to check on patient.  He was found down but alert and awake.  No incontinence nor oral trauma noted.   He is complained of right rib pain and right abdominal pain.  His family feels that his speech may be more slurred than his baseline.   He denies fevers, chills, shortness of breath, abdominal pain,  constipation, diarrhea, nausea, vomiting.   ED Course: Vital signs in the ED stable.  Lab workup notable for BMP showing sodium of 150, improved to 146 on subsequent labs.  Potassium 3.  Additional labs included creatinine elevated to 1.54, calcium 8.6.  CBC within normal limits.  Magnesium 1.4.  CK trend 1017, 1419.  Troponin elevated to 3200 with repeat pending.  Lactic acid pending.  Chest x-ray, pelvis x-ray, right knee x-ray, left knee x-ray, right elbow x-ray all without acute abnormality.  CT head without acute abnormality.  CT of the chest abdomen and pelvis noting only eighth rib fracture on the right, left upper lobe nodule of 8 mm, hepatic steatosis.  MR brain pending.  Patient received fentanyl, 500 cc IV fluids in the ED.  Also received aspirin and magnesium.  Cardiology consulted towards the end of his workup with troponin came back positive at 3200."  MRI brain was negative for acute intracranial abnormality.  Cardiology was consulted for elevated troponin and ongoing evaluation. Echocardiogram revealed reduced LVEF of 30% with cardiology assessment of Takotsubo cardiomyopathy and associated demand ischemia. During admission, patient developed transition of rhythm to atrial fibrillation with associated rapid ventricular rate. Rate controlled eventually with metoprolol, digoxin, amiodarone. Patient converted back to sinus rhythm. Hospitalization also complicated by confusion/delirium which has improved.  Discharge Diagnoses:   Principal Problem:   Fall Active Problems:   Type 2 diabetes mellitus (HCC)   Hypertension   Glaucoma   Low back pain  Diabetic neuropathy (HCC)   Esophageal reflux   Elevated troponin   Cerebellar Hemorrhagic stroke (HCC)   HFrEF (heart failure with reduced ejection fraction) (HCC)   PAF (paroxysmal atrial fibrillation) (HCC)   Hypomagnesemia   Hypernatremia   Acute metabolic encephalopathy   Rhabdomyolysis   Sinus bradycardia   NSVT (nonsustained  ventricular tachycardia) (HCC)   Acute combined systolic and diastolic congestive heart failure (HCC)   NSTEMI (non-ST elevated myocardial infarction) (HCC)   Syncope and collapse   Rib fractures   Discharge Instructions  Discharge Instructions     Increase activity slowly   Complete by: As directed       Allergies as of 12/16/2022       Reactions   Colestipol Other (See Comments)   Unknown reaction per [facility]   Ezetimibe Other (See Comments)   Unknown reaction per [facility]   Hydrocodone-acetaminophen Other (See Comments)   Unknown reaction per [facility]   Lisinopril Cough   Lotensin [benazepril Hcl] Cough   Not listed under allergies/reactions per [facility]   Lovastatin Other (See Comments)   Unknown reaction per [facility] **Mevacor**   Penicillins Other (See Comments)   pt becomes "nervous" Has patient had a PCN reaction causing immediate rash, facial/tongue/throat swelling, SOB or lightheadedness with hypotension:  Has patient had a PCN reaction causing severe rash involving mucus membranes or skin necrosis:  Has patient had a PCN reaction that required hospitalization  Has patient had a PCN reaction occurring within the last 10 years:  If all of the above answers are "NO", then may proceed with Cephalosporin use.   Simvastatin Other (See Comments)   Unknown reaction per [facility]        Medication List     STOP taking these medications    doxycycline 100 MG tablet Commonly known as: VIBRA-TABS   meclizine 25 MG tablet Commonly known as: ANTIVERT   metoCLOPramide 10 MG tablet Commonly known as: REGLAN   potassium chloride 20 MEQ/15ML (10%) Soln       TAKE these medications    acetaminophen 325 MG tablet Commonly known as: TYLENOL Take 650 mg by mouth every 6 (six) hours as needed for mild pain.   amiodarone 200 MG tablet Commonly known as: PACERONE Take 1 tablet (200 mg total) by mouth daily. Start taking on: Dec 17, 2022   ammonium lactate 12 % lotion Commonly  known as: LAC-HYDRIN Apply 1 Application topically 2 (two) times daily as needed for dry skin.   brimonidine 0.2 % ophthalmic solution Commonly known as: ALPHAGAN Place 1 drop into both eyes every 8 (eight) hours.   dorzolamide-timolol 2-0.5 % ophthalmic solution Commonly known as: COSOPT Place 1 drop into both eyes 2 (two) times daily.   insulin glargine 100 UNIT/ML injection Commonly known as: LANTUS Inject 10-20 Units into the skin See admin instructions. If bs is  175 or below =10 units, if bs is 175 = 15 units. If bs is 200= 20 units   latanoprost 0.005 % ophthalmic solution Commonly known as: XALATAN Place 1 drop into both eyes at bedtime.   multivitamin with minerals Tabs tablet Take 1 tablet by mouth daily.   nicotine polacrilex 2 MG lozenge Commonly known as: COMMIT Take 2 mg by mouth as needed for smoking cessation.   polyvinyl alcohol 1.4 % ophthalmic solution Commonly known as: LIQUIFILM TEARS Place 1 drop into both eyes 4 (four) times daily as needed for dry eyes.   triamcinolone cream 0.1 % Commonly known as: KENALOG  Apply 1 Application topically 2 (two) times daily as needed (scalp lesion).        Contact information for follow-up providers     Margit Hanks, MD Follow up in 1 week(s).   Specialty: Internal Medicine Contact information: [address] [place] [address] [phone]              Contact information for after-discharge care     Destination     [facility] Preferred SNF .   Service: Skilled Nursing Contact information: [address] [place] [address] [phone]                    Allergies  Allergen Reactions   Colestipol Other (See Comments)    Unknown reaction per [facility]   Ezetimibe Other (See Comments)    Unknown reaction per [facility]    Hydrocodone-Acetaminophen Other (See Comments)    Unknown reaction per [facility]    Lisinopril Cough   Lotensin  [Benazepril Hcl] Cough    Not listed under allergies/reactions per [facility]   Lovastatin Other (See Comments)    Unknown reaction per [facility]  **Mevacor**   Penicillins Other (See Comments)    pt becomes "nervous" Has patient had a PCN reaction causing immediate rash, facial/tongue/throat swelling, SOB or lightheadedness with hypotension:  Has patient had a PCN reaction causing severe rash involving mucus membranes or skin necrosis:  Has patient had a PCN reaction that required hospitalization  Has patient had a PCN reaction occurring within the last 10 years:  If all of the above answers are "NO", then may proceed with Cephalosporin use.    Simvastatin Other (See Comments)    Unknown reaction per [facility]     Consultations: Cardiology   Procedures/Studies: DG Swallowing Func-Speech Pathology  Result Date: 12/11/2022 Table formatting from the original result was not included. Modified Barium Swallow Study Patient Details Name: Bill Jackson MRN: [id number] Date of Birth: 12/12/1934 Today's Date: 12/11/2022 HPI/PMH: HPI: Patient is an 88 y.o. male with PMH: DM, CVA (with h/o dysphagia requiring modified diet and PEG), syncope, HTN, GERD, neuropathy He presented to the hospital on 12/09/22 following fall at home. (patient lives by himself but with nursing visits several times a week) Family was unable to reach him on the phone and they called EMS. In ED, patient's vitals were stable, CT head without acute intracranial abnormality, T of the chest abdomen and pelvis noting only eighth rib fracture on the right, left upper lobe nodule of 8 mm, hepatic steatosis. SLP swallow evaluation ordered secondary to observed coughing with PO's by nursing. Clinical Impression: Clinical Impression: Pt demonstrates impaired swallowing with sensed, moderately significant aspiration events of large nectar and thin sips before the swallow. When thin or nectar sips are smaller and controlled pt has instances of trace aspiration  just below the vocal folds with ejection. A chin tuck with large sips also reduced severity of aspiration to trace quantities that are ejected. Puree and mechanical soft solids are tolerated with minimal pharyngeal residue. Pt does not appear to have significant weakness but there was inconsistent collections of mild residue in sulci. For now, pt to continue dys 2/honey thick, but would like therapists that f/u to engage pt and family in discussion about acceptance of risk with thin liquids and strategies that may reduce risk when possible. Pt says he hates honey thick liquids and is likely not consuming them at home anyway. Factors that may increase risk  of adverse event in presence of aspiration Rubye Oaks & Clearance Coots 2021): Factors that may increase risk of adverse event in presence of aspiration Rubye Oaks & Clearance Coots 2021): Reduced cognitive function Recommendations/Plan: Swallowing Evaluation Recommendations Swallowing Evaluation Recommendations Recommendations: PO diet PO Diet Recommendation: Moderately thick liquids (Level 3, honey thick); Dysphagia 2 (Finely chopped) Medication Administration: Whole meds with puree Supervision: Staff to assist with self-feeding Swallowing strategies  : Slow rate; Small bites/sips Treatment Plan Treatment Plan Treatment recommendations: Therapy as outlined in treatment plan below Follow-up recommendations: Home health SLP Treatment frequency: Min 2x/week Treatment duration: 2 weeks Recommendations Recommendations for follow up therapy are one component of a multi-disciplinary discharge planning process, led by the attending physician.  Recommendations may be updated based on patient status, additional functional criteria and insurance authorization. Assessment: Orofacial Exam: Orofacial Exam Oral Cavity - Dentition: Adequate natural dentition Anatomy: Anatomy: WFL Boluses Administered: Boluses Administered Boluses Administered: Thin liquids (Level 0); Mildly thick liquids (Level 2,  nectar thick); Moderately thick liquids (Level 3, honey thick); Puree; Solid  Oral Impairment Domain: Oral Impairment Domain Lip Closure: No labial escape Tongue control during bolus hold: Escape to lateral buccal cavity/floor of mouth Bolus preparation/mastication: Slow prolonged chewing/mashing with complete recollection Bolus transport/lingual motion: Delayed initiation of tongue motion (oral holding) Oral residue: Trace residue lining oral structures Location of oral residue : Tongue; Floor of mouth Initiation of pharyngeal swallow : Pyriform sinuses  Pharyngeal Impairment Domain: Pharyngeal Impairment Domain Soft palate elevation: No bolus between soft palate (SP)/pharyngeal wall (PW) Laryngeal elevation: Complete superior movement of thyroid cartilage with complete approximation of arytenoids to epiglottic petiole Anterior hyoid excursion: Complete anterior movement Epiglottic movement: Complete inversion Laryngeal vestibule closure: Complete, no air/contrast in laryngeal vestibule Pharyngeal stripping wave : Present - complete Pharyngoesophageal segment opening: Complete distension and complete duration, no obstruction of flow Tongue base retraction: Trace column of contrast or air between tongue base and PPW Pharyngeal residue: Collection of residue within or on pharyngeal structures Location of pharyngeal residue: Valleculae; Pyriform sinuses  Esophageal Impairment Domain: No data recorded Pill: No data recorded Penetration/Aspiration Scale Score: Penetration/Aspiration Scale Score 1.  Material does not enter airway: Thin liquids (Level 0); Mildly thick liquids (Level 2, nectar thick); Moderately thick liquids (Level 3, honey thick); Puree; Solid 6.  Material enters airway, passes BELOW cords then ejected out: Thin liquids (Level 0); Mildly thick liquids (Level 2, nectar thick) Compensatory Strategies: Compensatory Strategies Compensatory strategies: Yes Chin tuck: Effective Effective Chin Tuck: Thin liquid  (Level 0); Mildly thick liquid (Level 2, nectar thick) (trace aspiration only)   General Information: Caregiver present: No  Diet Prior to this Study: Dysphagia 1 (pureed); Moderately thick liquids (Level 3, honey thick)   Temperature : Normal   Respiratory Status: WFL   Supplemental O2: None (Room air)   No data recorded Behavior/Cognition: Alert; Cooperative; Pleasant mood Self-Feeding Abilities: Needs assist with self-feeding Baseline vocal quality/speech: Normal Volitional Cough: Able to elicit Volitional Swallow: Able to elicit No data recorded Goal Planning: Prognosis for improved oropharyngeal function: Fair No data recorded No data recorded Patient/Family Stated Goal: nothing specific No data recorded Pain: Pain Assessment Pain Assessment: No/denies pain End of Session: Start Time:SLP Start Time (ACUTE ONLY): 1330 Stop Time: SLP Stop Time (ACUTE ONLY): 1400 Time Calculation:SLP Time Calculation (min) (ACUTE ONLY): 30 min Charges: SLP Evaluations $ SLP Speech Visit: 1 Visit SLP Evaluations $BSS Swallow: 1 Procedure $MBS Swallow: 1 Procedure $Swallowing Treatment: 1 Procedure SLP visit diagnosis: SLP Visit Diagnosis: Dysphagia, oropharyngeal phase (  R13.12) Past Medical History: Past Medical History: Diagnosis Date  Diabetes mellitus   Dysphagia following cerebrovascular accident   PEG tube  Hemorrhagic stroke (HCC) 12/10/2022  Hyperlipidemia   Hypertension   Left shoulder pain   Right knee pain   Stroke (HCC) 10/01/2012  R side weakness, slurred speech, dizziness, visual changes, ataxia; hemorrhagic cerebellar CVA, evacuated Past Surgical History: Past Surgical History: Procedure Laterality Date  CARDIAC SURGERY    CRANIECTOMY N/A 10/25/2012  Procedure: Suboccipital Craniectomy for Evacuation of Cerebellar Hematoma;  Surgeon: Maeola Harman, MD;  Location: [facility];  Service: Neurosurgery;  Laterality: N/A;  Suboccipital Craniectomy for Evacuation of Cerebellar Hematoma  heart stents  x3  HERNIA REPAIR    PEG  TUBE PLACEMENT    placed ?2014, changed 01/2014 (at [facility])  ROTATOR CUFF REPAIR   DeBlois, Riley Nearing 12/11/2022, 2:29 PM  ECHOCARDIOGRAM COMPLETE  Result Date: 12/10/2022    ECHOCARDIOGRAM REPORT   Patient Name:   Bill Jackson Date of Exam: 12/10/2022 Medical Rec #:  [id number]      Height:       72.0 in Accession #:    [id number]     Weight:       185.0 lb Date of Birth:  [birth year]      BSA:          2.061 m Patient Age:    88 years       BP:           127/94 mmHg Patient Gender: M              HR:           84 bpm. Exam Location:  Inpatient Procedure: 2D Echo, Cardiac Doppler, Color Doppler and Intracardiac            Opacification Agent Indications:    Elevated Troponin  History:        Patient has no prior history of Echocardiogram examinations.                 Risk Factors:Hypertension and Diabetes.  Sonographer:    Lucendia Herrlich Referring Phys: [phone] RALPH A NETTEY IMPRESSIONS  1. Left ventricular ejection fraction, by estimation, is 30%. The left ventricle has moderately decreased function. The left ventricle demonstrates regional wall motion abnormalities (see scoring diagram/findings for description). Left ventricular diastolic parameters are consistent with Grade I diastolic dysfunction (impaired relaxation).  2. Slow flowing apical contrast without discrete thrombus.  3. Right ventricular systolic function is normal. The right ventricular size is normal. There is normal pulmonary artery systolic pressure. The estimated right ventricular systolic pressure is 34.2 mmHg.  4. The mitral valve is grossly normal. Trivial mitral valve regurgitation.  5. The aortic valve is grossly normal. Aortic valve regurgitation is trivial. No aortic stenosis is present.  6. The inferior vena cava is normal in size with <50% respiratory variability, suggesting right atrial pressure of 8 mmHg. FINDINGS  Left Ventricle: Left ventricular ejection fraction, by estimation, is 30%. The left ventricle has moderately decreased  function. The left ventricle demonstrates regional wall motion abnormalities. Definity contrast agent was given IV to delineate the left ventricular endocardial borders. The left ventricular internal cavity size was normal in size. There is no left ventricular hypertrophy. Left ventricular diastolic parameters are consistent with Grade I diastolic dysfunction (impaired relaxation).  LV Wall Scoring: The mid and distal anterior wall, mid and distal lateral wall, mid and distal anterior septum, mid and distal inferior wall, and  mid inferoseptal segment are akinetic. The mid anterolateral segment is hypokinetic. Right Ventricle: The right ventricular size is normal. No increase in right ventricular wall thickness. Right ventricular systolic function is normal. There is normal pulmonary artery systolic pressure. The tricuspid regurgitant velocity is 2.56 m/s, and  with an assumed right atrial pressure of 8 mmHg, the estimated right ventricular systolic pressure is 34.2 mmHg. Left Atrium: Left atrial size was normal in size. Right Atrium: Right atrial size was normal in size. Pericardium: There is no evidence of pericardial effusion. Mitral Valve: The mitral valve is grossly normal. Trivial mitral valve regurgitation. Tricuspid Valve: The tricuspid valve is grossly normal. Tricuspid valve regurgitation is mild. Aortic Valve: The aortic valve is grossly normal. Aortic valve regurgitation is trivial. No aortic stenosis is present. Aortic valve peak gradient measures 5.0 mmHg. Pulmonic Valve: The pulmonic valve was grossly normal. Pulmonic valve regurgitation is trivial. Aorta: The aortic root is normal in size and structure. Venous: The inferior vena cava is normal in size with less than 50% respiratory variability, suggesting right atrial pressure of 8 mmHg. IAS/Shunts: The interatrial septum was not well visualized.  LEFT VENTRICLE PLAX 2D LVIDd:         4.60 cm   Diastology LVIDs:         3.30 cm   LV e' medial:    4.04  cm/s LV PW:         1.00 cm   LV E/e' medial:  9.5 LV IVS:        0.90 cm   LV e' lateral:   4.97 cm/s LVOT diam:     2.10 cm   LV E/e' lateral: 7.7 LV SV:         39 LV SV Index:   19 LVOT Area:     3.46 cm  IVC IVC diam: 1.90 cm LEFT ATRIUM           Index        RIGHT ATRIUM           Index LA diam:      2.30 cm 1.12 cm/m   RA Area:     14.20 cm LA Vol (A4C): 47.5 ml 23.05 ml/m  RA Volume:   36.00 ml  17.47 ml/m  AORTIC VALVE                 PULMONIC VALVE AV Area (Vmax): 2.11 cm     PR End Diast Vel: 9.86 msec AV Vmax:        112.00 cm/s AV Peak Grad:   5.0 mmHg LVOT Vmax:      68.23 cm/s LVOT Vmean:     40.900 cm/s LVOT VTI:       0.112 m  AORTA Ao Root diam: 3.50 cm Ao Asc diam:  3.20 cm MITRAL VALVE               TRICUSPID VALVE MV Area (PHT): 4.21 cm    TR Peak grad:   26.2 mmHg MV Decel Time: 180 msec    TR Vmax:        256.00 cm/s MV E velocity: 38.30 cm/s MV A velocity: 77.10 cm/s  SHUNTS MV E/A ratio:  0.50        Systemic VTI:  0.11 m                            Systemic Diam: 2.10 cm Weston Brass MD  Electronically signed by Weston Brass MD Signature Date/Time: 12/10/2022/2:26:28 PM    Final    DG CHEST PORT 1 VIEW  Result Date: 12/10/2022 CLINICAL DATA:  [place] EXAM: PORTABLE CHEST 1 VIEW COMPARISON:  X-ray 12/09/2022 FINDINGS: Stable cardiopericardial silhouette. Film is rotated to the left. Tortuous aorta. Increasing vascular congestion and trace edema, right-greater-than-left. No pneumothorax or effusion. No consolidation. Overlapping cardiac leads. Recommend follow-up IMPRESSION: Increasing vascular congestion and trace edema. Electronically Signed   By: Karen Kays M.D.   On: 12/10/2022 14:20   MR BRAIN WO CONTRAST  Result Date: 12/09/2022 CLINICAL DATA:  Acute neurologic deficit EXAM: MRI HEAD WITHOUT CONTRAST TECHNIQUE: Multiplanar, multiecho pulse sequences of the brain and surrounding structures were obtained without intravenous contrast. COMPARISON:  04/19/2018 FINDINGS:  Brain: No acute infarct, mass effect or extra-axial collection. Postsurgical changes of the cerebellum with encephalomalacia and right-sided siderosis. There is multifocal hyperintense T2-weighted signal within the white matter. Generalized volume loss. The midline structures are normal. Vascular: Major flow voids are preserved. Skull and upper cervical spine: Normal calvarium and skull base. Visualized upper cervical spine and soft tissues are normal. Sinuses/Orbits:No paranasal sinus fluid levels or advanced mucosal thickening. No mastoid or middle ear effusion. Normal orbits. IMPRESSION: 1. No acute intracranial abnormality. 2. Postsurgical changes of the cerebellum with encephalomalacia and siderosis. 3. Chronic ischemic microangiopathy. Electronically Signed   By: Deatra Robinson M.D.   On: 12/09/2022 21:45   DG Elbow 2 Views Right  Result Date: 12/09/2022 CLINICAL DATA:  Fall, patient rolled out of bed and was on the floor for unknown amount of time. EXAM: RIGHT ELBOW - 2 VIEW COMPARISON:  None Available. FINDINGS: There is no evidence of fracture, dislocation, or joint effusion. There is no evidence of arthropathy or other focal bone abnormality. Soft tissues are unremarkable. IMPRESSION: Negative. Electronically Signed   By: Larose Hires D.O.   On: 12/09/2022 17:34   DG Knee 2 Views Left  Result Date: 12/09/2022 CLINICAL DATA:  Fall patient rolled out of bed and was on the floor for unknown amount of time. EXAM: LEFT KNEE - 1-2 VIEW COMPARISON:  None Available. FINDINGS: No evidence of fracture, dislocation, or joint effusion. Tricompartmental knee joint space narrowing. Vascular calcifications. IMPRESSION: 1. No acute fracture or dislocation. 2. Tricompartmental knee joint space narrowing suggesting mild-to-moderate osteoarthritis. Electronically Signed   By: Larose Hires D.O.   On: 12/09/2022 17:32   DG Knee 2 Views Right  Result Date: 12/09/2022 CLINICAL DATA:  Fall.  Rolled out of bed and was on  the floor EXAM: RIGHT KNEE - 1-2 VIEW COMPARISON:  None Available. FINDINGS: No evidence of fracture, dislocation, or joint effusion. Tricompartmental knee joint space narrowing. Soft tissues are unremarkable. IMPRESSION: 1. No fracture or dislocation. 2. Mild knee osteoarthritis. Electronically Signed   By: Larose Hires D.O.   On: 12/09/2022 17:31   CT Head Wo Contrast  Result Date: 12/09/2022 CLINICAL DATA:  Neuro deficit.  Acute stroke suspected. EXAM: CT HEAD WITHOUT CONTRAST TECHNIQUE: Contiguous axial images were obtained from the base of the skull through the vertex without intravenous contrast. RADIATION DOSE REDUCTION: This exam was performed according to the departmental dose-optimization program which includes automated exposure control, adjustment of the mA and/or kV according to patient size and/or use of iterative reconstruction technique. COMPARISON:  04/13/2022 FINDINGS: Brain: There is no evidence for acute hemorrhage, hydrocephalus, mass lesion, or abnormal extra-axial fluid collection. No definite CT evidence for acute infarction. Diffuse loss of parenchymal volume is  consistent with atrophy. Patchy low attenuation in the deep hemispheric and periventricular white matter is nonspecific, but likely reflects chronic microvascular ischemic demyelination. Cerebellar encephalomalacia is stable with evidence of prior suboccipital craniectomy. Vascular: No hyperdense vessel or unexpected calcification. Skull: Suboccipital craniectomy defect evident. Sinuses/Orbits: The visualized paranasal sinuses and mastoid air cells are clear. Visualized portions of the globes and intraorbital fat are unremarkable. Other: None. IMPRESSION: 1. Stable.  No acute intracranial abnormality. 2. Atrophy with chronic small vessel ischemic disease. 3. Status post suboccipital craniectomy with stable cerebellar encephalomalacia. Electronically Signed   By: Kennith Center M.D.   On: 12/09/2022 17:06   CT CHEST ABDOMEN PELVIS  W CONTRAST  Result Date: 12/09/2022 CLINICAL DATA:  Fall from bed, right-sided rib pain. EXAM: CT CHEST, ABDOMEN, AND PELVIS WITH CONTRAST TECHNIQUE: Multidetector CT imaging of the chest, abdomen and pelvis was performed following the standard protocol during bolus administration of intravenous contrast. RADIATION DOSE REDUCTION: This exam was performed according to the departmental dose-optimization program which includes automated exposure control, adjustment of the mA and/or kV according to patient size and/or use of iterative reconstruction technique. CONTRAST:  75mL OMNIPAQUE IOHEXOL 350 MG/ML SOLN COMPARISON:  CT pelvis 10/30/2020, CT abdomen pelvis 09/07/2016. FINDINGS: CT CHEST FINDINGS Cardiovascular: Atherosclerotic calcification of the aorta, aortic valve and coronary arteries. Heart is at the upper limits of normal in size. No pericardial effusion. Mediastinum/Nodes: No pathologically enlarged mediastinal, hilar or axillary lymph nodes. Esophagus is grossly unremarkable. Lungs/Pleura: Image quality is degraded by respiratory motion. Mild dependent atelectasis bilaterally. 8 mm posterior left upper lobe nodule (5/46) with surrounding ground-glass. No pleural fluid. Airway is unremarkable. Musculoskeletal: Nondisplaced fracture of the posterolateral right eighth rib. CT ABDOMEN PELVIS FINDINGS Hepatobiliary: Liver is decreased in attenuation diffusely. Multiple hypoattenuating cysts in the liver. Liver and gallbladder are otherwise unremarkable. No biliary ductal dilatation. Pancreas: Negative. Spleen: Negative. Adrenals/Urinary Tract: Adrenal glands and kidneys are unremarkable. Ureters are decompressed. Right posterolateral bladder diverticulum. Bladder is otherwise grossly unremarkable. Stomach/Bowel: Stomach, small bowel, appendix and colon are unremarkable. Vascular/Lymphatic: Atherosclerotic calcification of the aorta. No pathologically enlarged lymph nodes. Reproductive: Prostate is visualized.  Other: No free fluid.  Mesenteries and peritoneum are unremarkable. Musculoskeletal: Degenerative changes in the spine. Dextroconvex scoliosis. IMPRESSION: 1. Nondisplaced posterolateral right eighth rib fracture. No additional evidence of acute trauma. 2. 8 mm posterior left upper lobe nodule, possibly infectious/inflammatory in etiology. Recommend follow-up CT chest without contrast in 4-6 weeks in further evaluation, as clinically indicated, as malignancy cannot be excluded. 3. Hepatic steatosis. 4. Aortic atherosclerosis (ICD10-I70.0). Coronary artery calcification. Electronically Signed   By: Leanna Battles M.D.   On: 12/09/2022 14:12   DG Chest Port 1 View  Result Date: 12/09/2022 CLINICAL DATA:  Trauma EXAM: PORTABLE CHEST 1 VIEW COMPARISON:  CXR 09/22/14 FINDINGS: No pleural effusion. No pneumothorax. No focal airspace opacity. Normal cardiac and mediastinal contours. No radiographically apparent displaced rib fractures. Visualized upper abdomen is unremarkable. IMPRESSION: No focal airspace opacity. Electronically Signed   By: Lorenza Cambridge M.D.   On: 12/09/2022 13:16   DG Pelvis Portable  Result Date: 12/09/2022 CLINICAL DATA:  Pain after trauma EXAM: PORTABLE PELVIS 1 VIEWS COMPARISON:  None Available. FINDINGS: Osteopenia. Hyperostosis. No fracture or dislocation. Preserved joint spaces of the hips and pubic symphysis. Mild degenerative changes of the sacroiliac joints. Vascular calcifications. Overlapping cardiac leads. With this level of osteopenia evaluation for subtle nondisplaced injury is difficult to completely exclude and if there is further concern, additional cross-sectional imaging as  clinically appropriate IMPRESSION: Osteopenia.  Mild degenerative changes. Electronically Signed   By: Karen Kays M.D.   On: 12/09/2022 13:12      Discharge Exam: Vitals:   12/16/22 0746 12/16/22 1132  BP: 124/74 (!) 97/55  Pulse: 62 62  Resp: 20 20  Temp: 98.3 F (36.8 C) 98.2 F (36.8 C)   SpO2: 98% 100%    General: Pt is alert, awake, not in acute distress Cardiovascular: Normal sinus rhythm, S1/S2 +, no edema Respiratory: CTA bilaterally, no wheezing, no rhonchi, no respiratory distress, no conversational dyspnea, on room air Abdominal: Soft, NT, ND, bowel sounds + Extremities: no edema, no cyanosis Psych: Normal mood and affect   The results of significant diagnostics from this hospitalization (including imaging, microbiology, ancillary and laboratory) are listed below for reference.     Microbiology: No results found for this or any previous visit (from the past 240 hour(s)).   Labs: BNP (last 3 results) No results for input(s): "BNP" in the last 8760 hours. Basic Metabolic Panel: Recent Labs  Lab 12/09/22 1621 12/10/22 0056 12/11/22 0406 12/11/22 1535 12/12/22 0329  NA 146* 144 147* 147* 143  K 3.0* 4.1 5.0  --  4.9  CL 107 107 110  --  109  CO2 25 24 25   --  26  GLUCOSE 76 247* 242*  --  90  BUN 13 16 22   --  25*  CREATININE 1.44* 1.43* 1.51*  --  1.38*  CALCIUM 8.2* 8.2* 8.4*  --  8.1*  MG 1.4* 1.9 1.6*  --   --   PHOS  --   --  1.9*  --   --    Liver Function Tests: Recent Labs  Lab 12/10/22 0056 12/11/22 0406  AST 64* 56*  ALT 23 25  ALKPHOS 58 58  BILITOT 1.9* 1.0  PROT 5.6* 6.0*  ALBUMIN 2.6* 2.5*   No results for input(s): "LIPASE", "AMYLASE" in the last 168 hours. No results for input(s): "AMMONIA" in the last 168 hours. CBC: Recent Labs  Lab 12/10/22 0056  WBC 6.7  HGB 13.2  HCT 38.1*  MCV 101.1*  PLT 235   Cardiac Enzymes: Recent Labs  Lab 12/09/22 1621 12/10/22 0853 12/11/22 0406 12/12/22 0329  CKTOTAL 1,419* 1,570* 969* 447*  CKMB  --   --  10.1*  --    BNP: Invalid input(s): "POCBNP" CBG: Recent Labs  Lab 12/15/22 1515 12/15/22 1709 12/15/22 2103 12/16/22 0812 12/16/22 1131  GLUCAP 204* 187* 150* 129* 178*   D-Dimer No results for input(s): "DDIMER" in the last 72 hours. Hgb A1c No results for  input(s): "HGBA1C" in the last 72 hours. Lipid Profile No results for input(s): "CHOL", "HDL", "LDLCALC", "TRIG", "CHOLHDL", "LDLDIRECT" in the last 72 hours. Thyroid function studies No results for input(s): "TSH", "T4TOTAL", "T3FREE", "THYROIDAB" in the last 72 hours.  Invalid input(s): "FREET3" Anemia work up No results for input(s): "VITAMINB12", "FOLATE", "FERRITIN", "TIBC", "IRON", "RETICCTPCT" in the last 72 hours. Urinalysis    Component Value Date/Time   COLORURINE AMBER (A) 12/10/2022 1527   APPEARANCEUR CLEAR 12/10/2022 1527   LABSPEC 1.035 (H) 12/10/2022 1527   PHURINE 5.0 12/10/2022 1527   GLUCOSEU NEGATIVE 12/10/2022 1527   HGBUR NEGATIVE 12/10/2022 1527   BILIRUBINUR SMALL (A) 12/10/2022 1527   KETONESUR 20 (A) 12/10/2022 1527   PROTEINUR 30 (A) 12/10/2022 1527   UROBILINOGEN 1.0 10/21/2014 1059   NITRITE NEGATIVE 12/10/2022 1527   LEUKOCYTESUR NEGATIVE 12/10/2022 1527   Sepsis Labs  Recent Labs  Lab 12/10/22 0056  WBC 6.7   Microbiology No results found for this or any previous visit (from the past 240 hour(s)).   Patient was seen and examined on the day of discharge and was found to be in stable condition. Time coordinating discharge: 40 minutes including assessment and coordination of care, as well as examination of the patient.   SIGNED:  Noralee Stain, DO [facility] 12/16/2022, 2:31 PM

## 2022-12-16 NOTE — TOC Progression Note (Signed)
Transition of Care [facility]) - Progression Note    [name] MRN: [id number] Date of Birth: 04/11/1935  Transition of Care [facility]) CM/SW Contact  Lorri Frederick, LCSW Phone Number: 12/16/2022, 10:25 AM  Clinical Narrative:   SNF auth approved: [id number], [id number], 3 days: 5/15-5/17.  MD informed.     Expected Discharge Plan: Skilled Nursing Facility Barriers to Discharge: Continued Medical Work up  Expected Discharge Plan and Services       Living arrangements for the past 2 months: Single Family Home                                       Social Determinants of Health (SDOH) Interventions SDOH Screenings   Food Insecurity: No Food Insecurity (12/09/2022)  Housing: Low Risk  (12/09/2022)  Transportation Needs: No Transportation Needs (12/09/2022)  Utilities: Not At Risk (12/09/2022)  Tobacco Use: High Risk (12/10/2022)    Readmission Risk Interventions     No data to display

## 2022-12-16 NOTE — Progress Notes (Signed)
Pt discharge education and instructions completed and report called off to nurse [place] at [facility]. Pt discharge to [facility] place SNF and waiting on [facility] to come transport off to disposition. Pt IV and telemetry removed, pt belongings packed and to transport with pt during discharge. Will continue to close monitor pt till pick up by [facility]. PSeleta Rhymes RN, MSN

## 2022-12-16 NOTE — Progress Notes (Signed)
[facility] her to pick up pt but pt HR noted to be in the mid 40's to 50's. MD notified and new orders received for a modified meds. Pt discharged to [facility] and Pt transported off unit via stretcher with belongings to the side. Dionne Bucy RN MSN    12/16/22 1435  Vitals  BP 94/82  MAP (mmHg) 88  BP Location Right Arm  BP Method Automatic  Patient Position (if appropriate) Lying  Pulse Rate (!) 49  Pulse Rate Source Monitor  Resp 16  Level of Consciousness  Level of Consciousness Alert  MEWS COLOR  MEWS Score Color Yellow  Oxygen Therapy  SpO2 98 %  O2 Device Room Air  MEWS Score  MEWS Temp 0  MEWS Systolic 1  MEWS Pulse 1  MEWS RR 0  MEWS LOC 0  MEWS Score 2

## 2022-12-16 NOTE — Progress Notes (Signed)
Heart Failure Navigator Progress Note  Assessed for Heart & Vascular TOC clinic readiness.  Patient EF 30%, plans to discharge to SNF with a [facility] follow up appointment scheduled on 12/30/2022. .   Navigator available for reassessment of patient.   Rhae Hammock, BSN, Scientist, clinical (histocompatibility and immunogenetics) Only

## 2022-12-16 NOTE — TOC Transition Note (Signed)
Transition of Care [facility]) - CM/SW Discharge Note   [name] MRN: [id number] Date of Birth: [birth year]  Transition of Care [facility]) CM/SW Contact:  Lorri Frederick, LCSW Phone Number: 12/16/2022, 12:51 PM   Clinical Narrative:   Pt discharging to [facility], room 207.  RN call report to [phone].     Final next level of care: Skilled Nursing Facility Barriers to Discharge: Barriers Resolved   Patient Goals and CMS Choice      Discharge Placement                Patient chooses bed at: [facility] Patient to be transferred to facility by: ptar Name of family member notified: daughter Misty Stanley Patient and family notified of of transfer: 12/16/22  Discharge Plan and Services Additional resources added to the After Visit Summary for                                       Social Determinants of Health (SDOH) Interventions SDOH Screenings   Food Insecurity: No Food Insecurity (12/09/2022)  Housing: Low Risk  (12/09/2022)  Transportation Needs: No Transportation Needs (12/09/2022)  Utilities: Not At Risk (12/09/2022)  Tobacco Use: High Risk (12/10/2022)     Readmission Risk Interventions     No data to display

## 2022-12-30 ENCOUNTER — Ambulatory Visit (HOSPITAL_BASED_OUTPATIENT_CLINIC_OR_DEPARTMENT_OTHER): Payer: Medicare PPO | Admitting: Family

## 2023-04-20 ENCOUNTER — Emergency Department (HOSPITAL_COMMUNITY): Payer: Medicare PPO

## 2023-04-20 ENCOUNTER — Other Ambulatory Visit: Payer: Self-pay

## 2023-04-20 ENCOUNTER — Inpatient Hospital Stay (HOSPITAL_COMMUNITY)
Admission: EM | Admit: 2023-04-20 | Discharge: 2023-04-24 | DRG: 378 | Disposition: A | Discharge status: Skilled Nursing Facility | Payer: Medicare PPO | Source: Skilled Nursing Facility | Attending: Internal Medicine | Admitting: Internal Medicine

## 2023-04-20 DIAGNOSIS — Z955 Presence of coronary angioplasty implant and graft: Secondary | ICD-10-CM

## 2023-04-20 DIAGNOSIS — Z794 Long term (current) use of insulin: Secondary | ICD-10-CM

## 2023-04-20 DIAGNOSIS — R471 Dysarthria and anarthria: Secondary | ICD-10-CM | POA: Diagnosis present

## 2023-04-20 DIAGNOSIS — Z7982 Long term (current) use of aspirin: Secondary | ICD-10-CM

## 2023-04-20 DIAGNOSIS — K922 Gastrointestinal hemorrhage, unspecified: Principal | ICD-10-CM | POA: Diagnosis present

## 2023-04-20 DIAGNOSIS — E11649 Type 2 diabetes mellitus with hypoglycemia without coma: Secondary | ICD-10-CM | POA: Diagnosis not present

## 2023-04-20 DIAGNOSIS — Z9185 Personal history of military service: Secondary | ICD-10-CM

## 2023-04-20 DIAGNOSIS — I480 Paroxysmal atrial fibrillation: Secondary | ICD-10-CM | POA: Diagnosis present

## 2023-04-20 DIAGNOSIS — I5022 Chronic systolic (congestive) heart failure: Secondary | ICD-10-CM | POA: Diagnosis present

## 2023-04-20 DIAGNOSIS — R933 Abnormal findings on diagnostic imaging of other parts of digestive tract: Secondary | ICD-10-CM

## 2023-04-20 DIAGNOSIS — K5731 Diverticulosis of large intestine without perforation or abscess with bleeding: Principal | ICD-10-CM | POA: Diagnosis present

## 2023-04-20 DIAGNOSIS — Z66 Do not resuscitate: Secondary | ICD-10-CM | POA: Diagnosis present

## 2023-04-20 DIAGNOSIS — Z885 Allergy status to narcotic agent status: Secondary | ICD-10-CM

## 2023-04-20 DIAGNOSIS — I959 Hypotension, unspecified: Secondary | ICD-10-CM | POA: Diagnosis present

## 2023-04-20 DIAGNOSIS — I2510 Atherosclerotic heart disease of native coronary artery without angina pectoris: Secondary | ICD-10-CM | POA: Diagnosis present

## 2023-04-20 DIAGNOSIS — I69251 Hemiplegia and hemiparesis following other nontraumatic intracranial hemorrhage affecting right dominant side: Secondary | ICD-10-CM

## 2023-04-20 DIAGNOSIS — I69351 Hemiplegia and hemiparesis following cerebral infarction affecting right dominant side: Secondary | ICD-10-CM

## 2023-04-20 DIAGNOSIS — E785 Hyperlipidemia, unspecified: Secondary | ICD-10-CM | POA: Diagnosis present

## 2023-04-20 DIAGNOSIS — Z79899 Other long term (current) drug therapy: Secondary | ICD-10-CM

## 2023-04-20 DIAGNOSIS — I130 Hypertensive heart and chronic kidney disease with heart failure and stage 1 through stage 4 chronic kidney disease, or unspecified chronic kidney disease: Secondary | ICD-10-CM | POA: Diagnosis present

## 2023-04-20 DIAGNOSIS — Z888 Allergy status to other drugs, medicaments and biological substances status: Secondary | ICD-10-CM

## 2023-04-20 DIAGNOSIS — Z23 Encounter for immunization: Secondary | ICD-10-CM

## 2023-04-20 DIAGNOSIS — Z880 Allergy status to penicillin: Secondary | ICD-10-CM

## 2023-04-20 DIAGNOSIS — E1122 Type 2 diabetes mellitus with diabetic chronic kidney disease: Secondary | ICD-10-CM | POA: Diagnosis present

## 2023-04-20 DIAGNOSIS — Z1152 Encounter for screening for COVID-19: Secondary | ICD-10-CM

## 2023-04-20 DIAGNOSIS — D62 Acute posthemorrhagic anemia: Secondary | ICD-10-CM

## 2023-04-20 DIAGNOSIS — K625 Hemorrhage of anus and rectum: Secondary | ICD-10-CM | POA: Diagnosis present

## 2023-04-20 DIAGNOSIS — K921 Melena: Secondary | ICD-10-CM

## 2023-04-20 DIAGNOSIS — F17210 Nicotine dependence, cigarettes, uncomplicated: Secondary | ICD-10-CM | POA: Diagnosis present

## 2023-04-20 DIAGNOSIS — E1165 Type 2 diabetes mellitus with hyperglycemia: Secondary | ICD-10-CM | POA: Diagnosis present

## 2023-04-20 DIAGNOSIS — N1831 Chronic kidney disease, stage 3a: Secondary | ICD-10-CM | POA: Diagnosis present

## 2023-04-20 LAB — CBC WITH DIFFERENTIAL/PLATELET
Abs Immature Granulocytes: 0.01 10*3/uL (ref 0.00–0.07)
Basophils Absolute: 0 10*3/uL (ref 0.0–0.1)
Basophils Relative: 0 %
Eosinophils Absolute: 0.1 10*3/uL (ref 0.0–0.5)
Eosinophils Relative: 3 %
HCT: 32.3 % — ABNORMAL LOW (ref 39.0–52.0)
Hemoglobin: 10.5 g/dL — ABNORMAL LOW (ref 13.0–17.0)
Immature Granulocytes: 0 %
Lymphocytes Relative: 42 %
Lymphs Abs: 2.1 10*3/uL (ref 0.7–4.0)
MCH: 28.9 pg (ref 26.0–34.0)
MCHC: 32.5 g/dL (ref 30.0–36.0)
MCV: 89 fL (ref 80.0–100.0)
Monocytes Absolute: 0.6 10*3/uL (ref 0.1–1.0)
Monocytes Relative: 12 %
Neutro Abs: 2.2 10*3/uL (ref 1.7–7.7)
Neutrophils Relative %: 43 %
Platelets: 291 10*3/uL (ref 150–400)
RBC: 3.63 MIL/uL — ABNORMAL LOW (ref 4.22–5.81)
RDW: 13.6 % (ref 11.5–15.5)
WBC: 5.1 10*3/uL (ref 4.0–10.5)
nRBC: 0 % (ref 0.0–0.2)

## 2023-04-20 LAB — COMPREHENSIVE METABOLIC PANEL
ALT: 12 U/L (ref 0–44)
AST: 17 U/L (ref 15–41)
Albumin: 2.5 g/dL — ABNORMAL LOW (ref 3.5–5.0)
Alkaline Phosphatase: 45 U/L (ref 38–126)
Anion gap: 9 (ref 5–15)
BUN: 20 mg/dL (ref 8–23)
CO2: 27 mmol/L (ref 22–32)
Calcium: 8 mg/dL — ABNORMAL LOW (ref 8.9–10.3)
Chloride: 103 mmol/L (ref 98–111)
Creatinine, Ser: 1.31 mg/dL — ABNORMAL HIGH (ref 0.61–1.24)
GFR, Estimated: 53 mL/min — ABNORMAL LOW (ref >60)
Glucose, Bld: 223 mg/dL — ABNORMAL HIGH (ref 70–99)
Potassium: 3.7 mmol/L (ref 3.5–5.1)
Sodium: 139 mmol/L (ref 135–145)
Total Bilirubin: 0.6 mg/dL (ref 0.3–1.2)
Total Protein: 5.4 g/dL — ABNORMAL LOW (ref 6.5–8.1)

## 2023-04-20 LAB — LIPASE, BLOOD: Lipase: 53 U/L — ABNORMAL HIGH (ref 11–51)

## 2023-04-20 LAB — PREPARE RBC (CROSSMATCH)

## 2023-04-20 LAB — I-STAT CG4 LACTIC ACID, ED: Lactic Acid, Venous: 1.6 mmol/L (ref 0.5–1.9)

## 2023-04-20 MED ORDER — LACTATED RINGERS IV BOLUS
500.0000 mL | Freq: Once | INTRAVENOUS | Status: AC
  Administered 2023-04-20: 500 mL via INTRAVENOUS

## 2023-04-20 MED ORDER — IOHEXOL 350 MG/ML SOLN
75.0000 mL | Freq: Once | INTRAVENOUS | Status: AC | PRN
  Administered 2023-04-20: 75 mL via INTRAVENOUS

## 2023-04-20 MED ORDER — PANTOPRAZOLE SODIUM 40 MG IV SOLR
40.0000 mg | Freq: Once | INTRAVENOUS | Status: AC
  Administered 2023-04-20: 40 mg via INTRAVENOUS
  Filled 2023-04-20: qty 10

## 2023-04-20 MED ORDER — ACETAMINOPHEN 325 MG PO TABS
650.0000 mg | ORAL_TABLET | Freq: Once | ORAL | Status: AC
  Administered 2023-04-20: 650 mg via ORAL
  Filled 2023-04-20: qty 2

## 2023-04-20 MED ORDER — SODIUM CHLORIDE 0.9% IV SOLUTION: Freq: Once | INTRAVENOUS | Status: AC

## 2023-04-20 NOTE — ED Notes (Signed)
Patient transported to CT 

## 2023-04-20 NOTE — ED Triage Notes (Signed)
Pt presents via EMS from [facility] with bloody stools since approximately 1400 today. Per EMS, patient was initially hypotensive 90/54 with improvement to 106/64 after approximately of IVF. Patient denies pain. Per EMS patient has deficits from a previous stroke and is primarily bed bound. Pre-hospital BG 278.

## 2023-04-20 NOTE — ED Provider Notes (Incomplete)
EMERGENCY DEPARTMENT AT [facility] Provider Note   CSN: [id number] Arrival date & time: 04/20/23  1853     History {Add pertinent medical, surgical, social history, OB history to HPI:1} Chief Complaint  Patient presents with   Rectal Bleeding    NESHAWN SIEVE is a 88 y.o. male.   Rectal Bleeding 88 year old male history of diabetes, hypertension, hyperlipidemia, prior stroke presenting from nursing home for bloody stools.  Patient states he initially had bloody stools last night, no melena.  Another episode today and was sent here.  Reportedly was initially hypotensive with EMS received a small IV fluids.  He is at his neurologic baseline.  He denies any abdominal pain, no nausea or vomiting.  Is not on blood thinners.  No recent NSAID use.  He states has had bleeding before.  Unsure of last colonoscopy.     Home Medications Prior to Admission medications   Medication Sig Start Date End Date Taking? Authorizing Provider  acetaminophen (TYLENOL) 325 MG tablet Take 650 mg by mouth every 6 (six) hours as needed for mild pain.    [provider]  amiodarone (PACERONE) 200 MG tablet Take 1 tablet (200 mg total) by mouth daily. 12/17/22   Noralee Stain, DO  ammonium lactate (LAC-HYDRIN) 12 % lotion Apply 1 Application topically 2 (two) times daily as needed for dry skin.    [provider]  brimonidine (ALPHAGAN) 0.2 % ophthalmic solution Place 1 drop into both eyes every 8 (eight) hours. 01/19/13   Lucrezia Starch, NP  dorzolamide-timolol (COSOPT) 22.3-6.8 MG/ML ophthalmic solution Place 1 drop into both eyes 2 (two) times daily.    [provider]  insulin glargine (LANTUS) 100 UNIT/ML injection Inject 10-20 Units into the skin See admin instructions. If bs is  175 or below =10 units, if bs is 175 = 15 units. If bs is 200= 20 units    [provider]  latanoprost (XALATAN) 0.005 % ophthalmic solution Place 1 drop into both eyes at  bedtime. 01/19/13   Lucrezia Starch, NP  Multiple Vitamin (MULTIVITAMIN WITH MINERALS) TABS Take 1 tablet by mouth daily. 01/19/13   Lucrezia Starch, NP  nicotine polacrilex (COMMIT) 2 MG lozenge Take 2 mg by mouth as needed for smoking cessation.    [provider]  polyvinyl alcohol (LIQUIFILM TEARS) 1.4 % ophthalmic solution Place 1 drop into both eyes 4 (four) times daily as needed for dry eyes.    [provider]  triamcinolone cream (KENALOG) 0.1 % Apply 1 Application topically 2 (two) times daily as needed (scalp lesion).    [provider]      Allergies    Colestipol, Ezetimibe, Hydrocodone-acetaminophen, Lisinopril, Lotensin [benazepril hcl], Lovastatin, Penicillins, and Simvastatin    Review of Systems   Review of Systems  Gastrointestinal:  Positive for hematochezia.  Review of systems completed and notable as per HPI.  ROS otherwise negative.   Physical Exam Updated Vital Signs There were no vitals taken for this visit. Physical Exam Vitals and nursing note reviewed.  Constitutional:      General: He is not in acute distress.    Appearance: He is well-developed.  HENT:     Head: Normocephalic and atraumatic.     Mouth/Throat:     Mouth: Mucous membranes are moist.     Pharynx: Oropharynx is clear.  Eyes:     Extraocular Movements: Extraocular movements intact.     Conjunctiva/sclera: Conjunctivae normal.     Pupils:  Pupils are equal, round, and reactive to light.  Cardiovascular:     Rate and Rhythm: Normal rate and regular rhythm.     Heart sounds: No murmur heard. Pulmonary:     Effort: Pulmonary effort is normal. No respiratory distress.     Breath sounds: Normal breath sounds.  Abdominal:     Palpations: Abdomen is soft.     Tenderness: There is abdominal tenderness. There is no guarding or rebound.     Comments: Mild generalized tenderness  Genitourinary:    Comments: Dark red stool, loose.  No black stool or melena. Musculoskeletal:         General: No swelling.     Cervical back: Neck supple.     Right lower leg: No edema.     Left lower leg: No edema.  Skin:    General: Skin is warm and dry.     Capillary Refill: Capillary refill takes less than 2 seconds.  Neurological:     Mental Status: He is alert and oriented to person, place, and time. Mental status is at baseline.  Psychiatric:        Mood and Affect: Mood normal.     ED Results / Procedures / Treatments   Labs (all labs ordered are listed, but only abnormal results are displayed) Labs Reviewed - No data to display  EKG None  Radiology No results found.  Procedures Procedures  {Document cardiac monitor, telemetry assessment procedure when appropriate:1}  Medications Ordered in ED Medications - No data to display  ED Course/ Medical Decision Making/ A&P   {   Click here for ABCD2, HEART and other calculatorsREFRESH Note before signing :1}                              Medical Decision Making  Medical Decision Making:   TILMAN MEINEKE is a 88 y.o. male who presented to the ED today with rectal bleeding.  Reported hypotensive with EMS although normotensive here.  Here he had a large bowel movement with blood mixed with stool.  Blood peers red, slightly dark but no frank melena.  He does have some mild abdominal tenderness, will obtain CT scan for evaluation.  Suspect likely lower GI bleed.   {crccomplexity:27900} Reviewed and confirmed nursing documentation for past medical history, family history, social history.  Reassessment and Plan:   ***    Patient's presentation is most consistent with {EM COPA:27473}     {Document critical care time when appropriate:1} {Document review of labs and clinical decision tools ie heart score, Chads2Vasc2 etc:1}  {Document your independent review of radiology images, and any outside records:1} {Document your discussion with family members, caretakers, and with consultants:1} {Document social  determinants of health affecting pt's care:1} {Document your decision making why or why not admission, treatments were needed:1} Final Clinical Impression(s) / ED Diagnoses Final diagnoses:  None    Rx / DC Orders ED Discharge Orders     None

## 2023-04-21 ENCOUNTER — Encounter (HOSPITAL_COMMUNITY): Payer: Self-pay | Admitting: Internal Medicine

## 2023-04-21 DIAGNOSIS — K922 Gastrointestinal hemorrhage, unspecified: Secondary | ICD-10-CM | POA: Diagnosis present

## 2023-04-21 DIAGNOSIS — K625 Hemorrhage of anus and rectum: Secondary | ICD-10-CM | POA: Diagnosis present

## 2023-04-21 DIAGNOSIS — I5022 Chronic systolic (congestive) heart failure: Secondary | ICD-10-CM

## 2023-04-21 DIAGNOSIS — D62 Acute posthemorrhagic anemia: Secondary | ICD-10-CM | POA: Diagnosis not present

## 2023-04-21 DIAGNOSIS — Z8673 Personal history of transient ischemic attack (TIA), and cerebral infarction without residual deficits: Secondary | ICD-10-CM | POA: Diagnosis not present

## 2023-04-21 DIAGNOSIS — I69351 Hemiplegia and hemiparesis following cerebral infarction affecting right dominant side: Secondary | ICD-10-CM | POA: Diagnosis not present

## 2023-04-21 DIAGNOSIS — Z66 Do not resuscitate: Secondary | ICD-10-CM | POA: Diagnosis present

## 2023-04-21 DIAGNOSIS — Z885 Allergy status to narcotic agent status: Secondary | ICD-10-CM | POA: Diagnosis not present

## 2023-04-21 DIAGNOSIS — Z955 Presence of coronary angioplasty implant and graft: Secondary | ICD-10-CM | POA: Diagnosis not present

## 2023-04-21 DIAGNOSIS — Z1152 Encounter for screening for COVID-19: Secondary | ICD-10-CM | POA: Diagnosis not present

## 2023-04-21 DIAGNOSIS — Z888 Allergy status to other drugs, medicaments and biological substances status: Secondary | ICD-10-CM | POA: Diagnosis not present

## 2023-04-21 DIAGNOSIS — I130 Hypertensive heart and chronic kidney disease with heart failure and stage 1 through stage 4 chronic kidney disease, or unspecified chronic kidney disease: Secondary | ICD-10-CM | POA: Diagnosis present

## 2023-04-21 DIAGNOSIS — R471 Dysarthria and anarthria: Secondary | ICD-10-CM | POA: Diagnosis present

## 2023-04-21 DIAGNOSIS — I69251 Hemiplegia and hemiparesis following other nontraumatic intracranial hemorrhage affecting right dominant side: Secondary | ICD-10-CM | POA: Diagnosis not present

## 2023-04-21 DIAGNOSIS — K5731 Diverticulosis of large intestine without perforation or abscess with bleeding: Secondary | ICD-10-CM | POA: Diagnosis present

## 2023-04-21 DIAGNOSIS — Z23 Encounter for immunization: Secondary | ICD-10-CM | POA: Diagnosis present

## 2023-04-21 DIAGNOSIS — K921 Melena: Secondary | ICD-10-CM

## 2023-04-21 DIAGNOSIS — I959 Hypotension, unspecified: Secondary | ICD-10-CM | POA: Diagnosis present

## 2023-04-21 DIAGNOSIS — E11649 Type 2 diabetes mellitus with hypoglycemia without coma: Secondary | ICD-10-CM | POA: Diagnosis not present

## 2023-04-21 DIAGNOSIS — N1831 Chronic kidney disease, stage 3a: Secondary | ICD-10-CM | POA: Diagnosis present

## 2023-04-21 DIAGNOSIS — E785 Hyperlipidemia, unspecified: Secondary | ICD-10-CM | POA: Diagnosis present

## 2023-04-21 DIAGNOSIS — E1122 Type 2 diabetes mellitus with diabetic chronic kidney disease: Secondary | ICD-10-CM | POA: Diagnosis present

## 2023-04-21 DIAGNOSIS — I2510 Atherosclerotic heart disease of native coronary artery without angina pectoris: Secondary | ICD-10-CM | POA: Diagnosis present

## 2023-04-21 DIAGNOSIS — I480 Paroxysmal atrial fibrillation: Secondary | ICD-10-CM | POA: Diagnosis present

## 2023-04-21 DIAGNOSIS — Z880 Allergy status to penicillin: Secondary | ICD-10-CM | POA: Diagnosis not present

## 2023-04-21 DIAGNOSIS — Z794 Long term (current) use of insulin: Secondary | ICD-10-CM | POA: Diagnosis not present

## 2023-04-21 DIAGNOSIS — E1165 Type 2 diabetes mellitus with hyperglycemia: Secondary | ICD-10-CM | POA: Diagnosis present

## 2023-04-21 LAB — BPAM RBC
Blood Product Expiration Date: 202410162359
ISSUE DATE / TIME: 202409172209
Unit Type and Rh: 5100

## 2023-04-21 LAB — HEMOGLOBIN AND HEMATOCRIT, BLOOD
HCT: 30 % — ABNORMAL LOW (ref 39.0–52.0)
HCT: 28.5 % — ABNORMAL LOW (ref 39.0–52.0)
Hemoglobin: 9.7 g/dL — ABNORMAL LOW (ref 13.0–17.0)
Hemoglobin: 9.4 g/dL — ABNORMAL LOW (ref 13.0–17.0)

## 2023-04-21 LAB — GLUCOSE, CAPILLARY
Glucose-Capillary: 179 mg/dL — ABNORMAL HIGH (ref 70–99)
Glucose-Capillary: 191 mg/dL — ABNORMAL HIGH (ref 70–99)

## 2023-04-21 LAB — BASIC METABOLIC PANEL WITH GFR
Anion gap: 5 (ref 5–15)
BUN: 22 mg/dL (ref 8–23)
CO2: 27 mmol/L (ref 22–32)
Calcium: 7.7 mg/dL — ABNORMAL LOW (ref 8.9–10.3)
Chloride: 106 mmol/L (ref 98–111)
Creatinine, Ser: 1.27 mg/dL — ABNORMAL HIGH (ref 0.61–1.24)
GFR, Estimated: 55 mL/min — ABNORMAL LOW (ref >60)
Glucose, Bld: 185 mg/dL — ABNORMAL HIGH (ref 70–99)
Potassium: 4.1 mmol/L (ref 3.5–5.1)
Sodium: 138 mmol/L (ref 135–145)

## 2023-04-21 LAB — MAGNESIUM: Magnesium: 1.6 mg/dL — ABNORMAL LOW (ref 1.7–2.4)

## 2023-04-21 LAB — TYPE AND SCREEN
ABO/RH(D): O POS
Antibody Screen: NEGATIVE
Unit division: 0

## 2023-04-21 LAB — CBC
HCT: 29.5 % — ABNORMAL LOW (ref 39.0–52.0)
Hemoglobin: 9.8 g/dL — ABNORMAL LOW (ref 13.0–17.0)
MCH: 29.7 pg (ref 26.0–34.0)
MCHC: 33.2 g/dL (ref 30.0–36.0)
MCV: 89.4 fL (ref 80.0–100.0)
Platelets: 254 10*3/uL (ref 150–400)
RBC: 3.3 MIL/uL — ABNORMAL LOW (ref 4.22–5.81)
RDW: 13.7 % (ref 11.5–15.5)
WBC: 5.9 10*3/uL (ref 4.0–10.5)
nRBC: 0 % (ref 0.0–0.2)

## 2023-04-21 LAB — CBG MONITORING, ED
Glucose-Capillary: 114 mg/dL — ABNORMAL HIGH (ref 70–99)
Glucose-Capillary: 108 mg/dL — ABNORMAL HIGH (ref 70–99)
Glucose-Capillary: 69 mg/dL — ABNORMAL LOW (ref 70–99)

## 2023-04-21 LAB — PHOSPHORUS: Phosphorus: 4.4 mg/dL (ref 2.5–4.6)

## 2023-04-21 MED ORDER — LACTATED RINGERS IV BOLUS
1000.0000 mL | Freq: Once | INTRAVENOUS | Status: AC
  Administered 2023-04-21: 1000 mL via INTRAVENOUS

## 2023-04-21 MED ORDER — LACTATED RINGERS IV SOLN: INTRAVENOUS | Status: DC

## 2023-04-21 MED ORDER — PANTOPRAZOLE SODIUM 40 MG IV SOLR
40.0000 mg | Freq: Two times a day (BID) | INTRAVENOUS | Status: DC
  Administered 2023-04-21 – 2023-04-23 (×5): 40 mg via INTRAVENOUS
  Filled 2023-04-21 (×5): qty 10

## 2023-04-21 MED ORDER — AMIODARONE HCL 200 MG PO TABS
200.0000 mg | ORAL_TABLET | Freq: Every day | ORAL | Status: DC
  Administered 2023-04-21 – 2023-04-24 (×4): 200 mg via ORAL
  Filled 2023-04-21 (×4): qty 1

## 2023-04-21 MED ORDER — ASPIRIN 81 MG PO CHEW: 81.0000 mg | CHEWABLE_TABLET | Freq: Every day | ORAL | Status: DC

## 2023-04-21 MED ORDER — INFLUENZA VAC A&B SURF ANT ADJ 0.5 ML IM SUSY
0.5000 mL | PREFILLED_SYRINGE | INTRAMUSCULAR | Status: AC
  Administered 2023-04-24: 0.5 mL via INTRAMUSCULAR
  Filled 2023-04-21: qty 0.5

## 2023-04-21 MED ORDER — MAGNESIUM SULFATE 2 GM/50ML IV SOLN
2.0000 g | Freq: Once | INTRAVENOUS | Status: AC
  Administered 2023-04-21: 2 g via INTRAVENOUS
  Filled 2023-04-21: qty 50

## 2023-04-21 MED ORDER — INSULIN ASPART 100 UNIT/ML IJ SOLN
0.0000 [IU] | Freq: Three times a day (TID) | INTRAMUSCULAR | Status: DC
  Administered 2023-04-21 – 2023-04-22 (×3): 1 [IU] via SUBCUTANEOUS
  Administered 2023-04-23 (×2): 2 [IU] via SUBCUTANEOUS
  Administered 2023-04-24 (×2): 1 [IU] via SUBCUTANEOUS

## 2023-04-21 MED ORDER — POTASSIUM CHLORIDE 10 MEQ/100ML IV SOLN
10.0000 meq | INTRAVENOUS | Status: AC
  Administered 2023-04-21 (×3): 10 meq via INTRAVENOUS
  Filled 2023-04-21 (×3): qty 100

## 2023-04-21 NOTE — H&P (Signed)
History and Physical    Bill Jackson ZOX:[id number] DOB: [birth year] DOA: 04/20/2023  PCP: Margit Hanks, MD (Inactive)   Patient coming from:  [facility] ? LTC    Chief Complaint:  Chief Complaint  Patient presents with   Rectal Bleeding    HPI:  Bill Jackson is a 88 y.o. male with hx of history hemorrhagic CVA with evacuation, residual right-sided hemiparesis and dysarthria, heart failure with reduced ejection fraction, A-fib, diabetes, hypertension, who presented with GI bleeding x 1 day. He is unable to characterize the bleeding himself. Per RN at bedside has had 2 episodes of hematochezia while in ED. He reports associated lightheadedness. Has mid abd pain associated. Not on anticoagulation. Denies NSAID use. Reports remote hx of colonoscopy, does not know if any abnormalities.    Review of Systems:  ROS complete and negative except as marked above   Allergies  Allergen Reactions   Colestipol Other (See Comments)    Unknown reaction per [facility]   Ezetimibe Other (See Comments)    Unknown reaction per [facility]    Hydrocodone-Acetaminophen Other (See Comments)    Unknown reaction per [facility]    Lisinopril Cough   Lotensin [Benazepril Hcl] Cough    Not listed under allergies/reactions per [facility]   Lovastatin Other (See Comments)    Unknown reaction per [facility]  **Mevacor**   Penicillins Other (See Comments)    pt becomes "nervous" Has patient had a PCN reaction causing immediate rash, facial/tongue/throat swelling, SOB or lightheadedness with hypotension:  Has patient had a PCN reaction causing severe rash involving mucus membranes or skin necrosis:  Has patient had a PCN reaction that required hospitalization  Has patient had a PCN reaction occurring within the last 10 years:  If all of the above answers are "NO", then may proceed with Cephalosporin use.    Simvastatin Other (See Comments)    Unknown reaction per [facility]     Prior to Admission medications    Medication Sig Start Date End Date Taking? Authorizing Provider  acetaminophen (TYLENOL) 325 MG tablet Take 650 mg by mouth every 6 (six) hours as needed for mild pain.   Yes [provider]  amiodarone (PACERONE) 200 MG tablet Take 1 tablet (200 mg total) by mouth daily. 12/17/22  Yes Noralee Stain, DO  ammonium lactate (LAC-HYDRIN) 12 % lotion Apply 1 Application topically 2 (two) times daily as needed for dry skin.   Yes [provider]  aspirin 81 MG chewable tablet Chew 81 mg by mouth daily.   Yes [provider]  brimonidine (ALPHAGAN) 0.2 % ophthalmic solution Place 1 drop into both eyes every 8 (eight) hours. 01/19/13  Yes Lucrezia Starch, NP  carvedilol (COREG) 6.25 MG tablet Take 6.25 mg by mouth 2 (two) times daily. 03/31/23  Yes [provider]  dorzolamide-timolol (COSOPT) 22.3-6.8 MG/ML ophthalmic solution Place 1 drop into both eyes 2 (two) times daily.   Yes [provider]  insulin glargine (LANTUS) 100 UNIT/ML injection Inject 10-20 Units into the skin See admin instructions. If bs is  175 or below =10 units, if bs is 175 = 15 units. If bs is 200= 20 units   Yes [provider]  latanoprost (XALATAN) 0.005 % ophthalmic solution Place 1 drop into both eyes at bedtime. 01/19/13  Yes Lucrezia Starch, NP  loratadine (CLARITIN) 10 MG tablet Take 10 mg by mouth daily.   Yes [provider]  Multiple Vitamin (MULTIVITAMIN WITH MINERALS) TABS Take  1 tablet by mouth daily. 01/19/13  Yes Lucrezia Starch, NP  nicotine polacrilex (COMMIT) 2 MG lozenge Take 2 mg by mouth as needed for smoking cessation.   Yes [provider]  Nutritional Supplements (BOOST GLUCOSE CONTROL) LIQD Take 237 mLs by mouth with breakfast, with lunch, and with evening meal.   Yes [provider]  polyvinyl alcohol (LIQUIFILM TEARS) 1.4 % ophthalmic solution Place 1 drop into both eyes 4 (four) times daily as needed for dry eyes.   Yes [provider]  triamcinolone cream (KENALOG) 0.1 % Apply 1 Application topically 2 (two) times daily as needed (scalp lesion).   Yes [provider]    Past Medical History:  Diagnosis Date   Diabetes mellitus    Dysphagia following cerebrovascular accident    PEG tube   Hemorrhagic stroke (HCC) 12/10/2022   Hyperlipidemia    Hypertension    Left shoulder pain    Right knee pain    Stroke (HCC) 10/01/2012   R side weakness, slurred speech, dizziness, visual changes, ataxia; hemorrhagic cerebellar CVA, evacuated    Past Surgical History:  Procedure Laterality Date   CARDIAC SURGERY     CRANIECTOMY N/A 10/25/2012   Procedure: Suboccipital Craniectomy for Evacuation of Cerebellar Hematoma;  Surgeon: Bill Harman, MD;  Location: [facility];  Service: Neurosurgery;  Laterality: N/A;  Suboccipital Craniectomy for Evacuation of Cerebellar Hematoma   heart stents  x3   HERNIA REPAIR     PEG TUBE PLACEMENT     placed ?2014, changed 01/2014 (at [facility])   ROTATOR CUFF REPAIR       reports that he has been smoking. He started smoking about 35 years ago. He has a 6.3 pack-year smoking history. He has never used smokeless tobacco. He reports that he does not drink alcohol and does not use drugs.  Family History  Family history unknown: Yes     Physical Exam: Vitals:   04/21/23 0130 04/21/23 0400 04/21/23 0415 04/21/23 0445  BP: 94/62 (!) 89/58 101/60 99/67  Pulse: (!) 59 (!) 54 (!) 58 (!) 54  Resp: 20 19 11 14   Temp:  97.7 F (36.5 C)    TempSrc:      SpO2: 100% 98% 100% 98%  Weight:      Height:        Gen: Awake, alert, NAD, chronically ill appearing HEENT: There are pupillary defects bilaterally CV: Regular, normal S1, S2, no murmurs  Resp: Normal WOB, CTAB  Abd: Flat, normoactive, there is moderate periumbilical tenderness, without rebound, guarding, rigidity MSK: Symmetric, no edema  Skin: No rashes or lesions to exposed skin  Neuro: Alert and interactive, fully  oriented, right-sided hemiparesis Psych: euthymic, appropriate    Data review:   Labs reviewed, notable for: Hemoglobin 10.5-> 1 unit RBC -> 9.8  Baseline hemoglobin appears to be near 13  Micro:  Results for orders placed or performed during the hospital encounter of 04/13/22  Resp Panel by RT-PCR (Flu A&B, Covid) Anterior Nasal Swab     Status: None   Collection Time: 04/13/22  9:04 PM   Specimen: Anterior Nasal Swab  Result Value Ref Range Status   SARS Coronavirus 2 by RT PCR NEGATIVE NEGATIVE Final    Comment: (NOTE) SARS-CoV-2 target nucleic acids are NOT DETECTED.  The SARS-CoV-2 RNA is generally detectable in upper respiratory specimens during the acute phase of infection. The lowest concentration of SARS-CoV-2 viral copies this assay can detect is 138 copies/mL. A negative  result does not preclude SARS-Cov-2 infection and should not be used as the sole basis for treatment or other patient management decisions. A negative result may occur with  improper specimen collection/handling, submission of specimen other than nasopharyngeal swab, presence of viral mutation(s) within the areas targeted by this assay, and inadequate number of viral copies(<138 copies/mL). A negative result must be combined with clinical observations, patient history, and epidemiological information. The expected result is Negative.  Fact Sheet for Patients:  BloggerCourse.com  Fact Sheet for Healthcare Providers:  SeriousBroker.it  This test is no t yet approved or cleared by the [place] FDA and  has been authorized for detection and/or diagnosis of SARS-CoV-2 by FDA under an Emergency Use Authorization (EUA). This EUA will remain  in effect (meaning this test can be used) for the duration of the COVID-19 declaration under Section 564(b)(1) of the Act, 21 U.S.C.section 360bbb-3(b)(1), unless the authorization is terminated  or revoked  sooner.       Influenza A by PCR NEGATIVE NEGATIVE Final   Influenza B by PCR NEGATIVE NEGATIVE Final    Comment: (NOTE) The Xpert Xpress SARS-CoV-2/FLU/RSV plus assay is intended as an aid in the diagnosis of influenza from Nasopharyngeal swab specimens and should not be used as a sole basis for treatment. Nasal washings and aspirates are unacceptable for Xpert Xpress SARS-CoV-2/FLU/RSV testing.  Fact Sheet for Patients: BloggerCourse.com  Fact Sheet for Healthcare Providers: SeriousBroker.it  This test is not yet approved or cleared by the [place] FDA and has been authorized for detection and/or diagnosis of SARS-CoV-2 by FDA under an Emergency Use Authorization (EUA). This EUA will remain in effect (meaning this test can be used) for the duration of the COVID-19 declaration under Section 564(b)(1) of the Act, 21 U.S.C. section 360bbb-3(b)(1), unless the authorization is terminated or revoked.  Performed at [facility], [address] [place], [place] [address]     Imaging reviewed:  CT ABDOMEN PELVIS W CONTRAST  Result Date: 04/20/2023 CLINICAL DATA:  Abdominal pain EXAM: CT ABDOMEN AND PELVIS WITH CONTRAST TECHNIQUE: Multidetector CT imaging of the abdomen and pelvis was performed using the standard protocol following bolus administration of intravenous contrast. RADIATION DOSE REDUCTION: This exam was performed according to the departmental dose-optimization program which includes automated exposure control, adjustment of the mA and/or kV according to patient size and/or use of iterative reconstruction technique. CONTRAST:  75mL OMNIPAQUE IOHEXOL 350 MG/ML SOLN COMPARISON:  12/09/2022 FINDINGS: Lower chest: Lung bases are clear. Hepatobiliary: Scattered hepatic cysts measuring up to 5.3 cm (series 3/image 13), benign. Gallbladder is unremarkable. No intrahepatic or extrahepatic ductal dilatation. Pancreas: Within  normal limits. Spleen: Within normal limits. Adrenals/Urinary Tract: Adrenal glands are within normal limits. Kidneys are within normal limits.  No hydronephrosis. Bladder is underdistended with a small right posterolateral bladder diverticulum (series 3/image 69). Stomach/Bowel: Stomach is within normal limits. No evidence of bowel obstruction. Normal appendix (series 3/image 43). Mild focal wall thickening with enhancement along the left lateral rectum (series 3/image 68), equivocal. Vascular/Lymphatic: No evidence of abdominal aortic aneurysm. Atherosclerotic calcifications of the abdominal aorta and branch vessels, although vessels remain patent. No suspicious abdominopelvic lymphadenopathy. Reproductive: Prostate is unremarkable. Other: No abdominopelvic ascites.  Trace presacral stranding. Mild fat in the left inguinal canal. Musculoskeletal: Mild degenerative changes at L3-4. IMPRESSION: No CT findings to account for the patient's abdominal pain. Mild focal wall thickening with enhancement along the left lateral rectum, equivocal. Consider colonoscopy as clinically warranted to assess for  early colonic mass. Additional ancillary findings as above. Electronically Signed   By: Charline Bills M.D.   On: 04/20/2023 21:39    ED Course: Developed hypotension during ED course with maps in the 60s, was transfused 1 unit RBC.  With 1 L IV fluid, pantoprazole 40 mg IV   Assessment/Plan:  88 y.o. male with hx history hemorrhagic CVA with evacuation, residual right-sided hemiparesis and dysarthria, heart failure with reduced ejection fraction, A-fib, diabetes, hypertension, who presented with GI bleeding x 1 day.    Suspect lower GI bleed Acute blood loss anemia Hypotension, hypovolemic with acute blood loss 2 days of bleeding, with hematochezia in Ed. Hemoglobin 10.5-> 1 unit RBC -> 9.8  Baseline hemoglobin appears to be near 13.  CT demonstrates focal wall thickening and enhancement on the left lateral  rectum, question if this may be site of bleeding.  -ED discussed with [facility] to see in morning -N.p.o. in case initial plan for upper endoscopy, likely will need prep for colonoscopy -Continue pantoprazole 40 mg IV every 12 hours in case of a brisk upper GI bleed -Status post 1 unit RBC transfusion, trend CBC -Discussion about aspirin in CAD below, currently on hold -Status post 2 L IV fluid in addition to blood with improvement in BP, and continues on a maintenance rate 75 cc an hour -Held off on additional workup of anemia as does not appear he has a chronic component  Chronic medical problems: Coronary artery disease with history of PCI: Per pharmacy med rec he is taking aspirin, I would confirm this before continuing in the setting of his active GI bleeding.  He did not know his medications on my interview, and on the last discharge summary 5/24 was not listed.  But if he has been taking would continue. hemorrhagic stroke with evacuation: Chronic right-sided hemiparesis and dysarthria A-fib: He is not on anticoagulation.  Continue home amiodarone Heart failure with reduced ejection fraction: Last TTE 5/24 LVEF 30%.  Hold home Coreg, is not on diuretics Diabetes: Takes 10 to 20 units of Lantus daily.  Hold while n.p.o., SSI Hypertension: See meds and heart failure above  Body mass index is 24.82 kg/m.    DVT prophylaxis:  SCDs Code Status:  DNR/DNI(Do NOT Intubate); confirmed with the patient Diet:  Diet Orders (From admission, onward)     Start     Ordered   04/21/23 0224  Diet NPO time specified Except for: Sips with Meds  Diet effective now       Question:  Except for  Answer:  Clearance Coots with Meds   04/21/23 0224           Family Communication: No Consults: GI Admission status:   Inpatient, Telemetry bed  Severity of Illness: The appropriate patient status for this patient is INPATIENT. Inpatient status is judged to be reasonable and necessary in order to provide the  required intensity of service to ensure the patient's safety. The patient's presenting symptoms, physical exam findings, and initial radiographic and laboratory data in the context of their chronic comorbidities is felt to place them at high risk for further clinical deterioration. Furthermore, it is not anticipated that the patient will be medically stable for discharge from the hospital within 2 midnights of admission.   * I certify that at the point of admission it is my clinical judgment that the patient will require inpatient hospital care spanning beyond 2 midnights from the point of admission due to high intensity of service, high  risk for further deterioration and high frequency of surveillance required.*   Dolly Rias, MD [facility]  How to contact the [facility] Attending or Consulting provider 7A - 7P or covering provider during after hours 7P -7A, for this patient.  Check the care team in [facility] and look for a) attending/consulting [facility] provider listed and b) the [facility] team listed Log into [email or web address] and use [place]'s universal password to access. If you do not have the password, please contact the hospital operator. Locate the [facility] provider you are looking for under [facility] and page to a number that you can be directly reached. If you still have difficulty reaching the provider, please page the [facility] (Director on Call) for the Hospitalists listed on amion for assistance.  04/21/2023, 6:31 AM

## 2023-04-21 NOTE — Plan of Care (Signed)

## 2023-04-21 NOTE — ED Notes (Signed)
ED TO INPATIENT HANDOFF REPORT  ED Nurse Name and Phone #: Morrie Sheldon [phone]  X Name/Age/Gender Bill Jackson 88 y.o. male Room/Bed: 045C/045C  Code Status   Code Status: Limited: Do not attempt resuscitation (DNR) -DNR-LIMITED -Do Not Intubate/DNI   Home/SNF/Other Skilled nursing facility Patient oriented to: self, place, time, and situation Is this baseline? Yes      Chief Complaint GI bleed [K92.2]  Triage Note Pt presents via EMS from [facility] with bloody stools since approximately 1400 today. Per EMS, patient was initially hypotensive 90/54 with improvement to 106/64 after approximately of IVF. Patient denies pain. Per EMS patient has deficits from a previous stroke and is primarily bed bound. Pre-hospital BG 278.   Allergies Allergies  Allergen Reactions   Colestipol Other (See Comments)    Unknown reaction per [facility]   Ezetimibe Other (See Comments)    Unknown reaction per [facility]    Hydrocodone-Acetaminophen Other (See Comments)    Unknown reaction per [facility]    Lisinopril Cough   Lotensin [Benazepril Hcl] Cough    Not listed under allergies/reactions per [facility]   Lovastatin Other (See Comments)    Unknown reaction per [facility]  **Mevacor**   Penicillins Other (See Comments)    pt becomes "nervous" Has patient had a PCN reaction causing immediate rash, facial/tongue/throat swelling, SOB or lightheadedness with hypotension:  Has patient had a PCN reaction causing severe rash involving mucus membranes or skin necrosis:  Has patient had a PCN reaction that required hospitalization  Has patient had a PCN reaction occurring within the last 10 years:  If all of the above answers are "NO", then may proceed with Cephalosporin use.    Simvastatin Other (See Comments)    Unknown reaction per [facility]     Level of Care/Admitting Diagnosis ED Disposition     ED Disposition  Admit   Condition  --   Comment  Hospital Area: [name]  [[id number]]  Level of Care: Telemetry Medical [104]  May admit patient to [facility] or [facility] if equivalent level of care is available:: No  Covid Evaluation: Asymptomatic - no recent exposure (last 10 days) testing not required  Diagnosis: GI bleed [914782]  Admitting Physician: Dolly Rias [[id number]]  Attending Physician: Dolly Rias [[id number]]  Certification:: I certify this patient will need inpatient services for at least 2 midnights  Expected Medical Readiness: 04/23/2023          B Medical/Surgery History Past Medical History:  Diagnosis Date   Diabetes mellitus    Dysphagia following cerebrovascular accident    PEG tube   Hemorrhagic stroke (HCC) 12/10/2022   Hyperlipidemia    Hypertension    Left shoulder pain    Right knee pain    Stroke (HCC) 10/01/2012   R side weakness, slurred speech, dizziness, visual changes, ataxia; hemorrhagic cerebellar CVA, evacuated   Past Surgical History:  Procedure Laterality Date   CARDIAC SURGERY     CRANIECTOMY N/A 10/25/2012   Procedure: Suboccipital Craniectomy for Evacuation of Cerebellar Hematoma;  Surgeon: Maeola Harman, MD;  Location: [facility];  Service: Neurosurgery;  Laterality: N/A;  Suboccipital Craniectomy for Evacuation of Cerebellar Hematoma   heart stents  x3   HERNIA REPAIR     PEG TUBE PLACEMENT     placed ?2014, changed 01/2014 (at [facility])   ROTATOR CUFF REPAIR       A IV Location/Drains/Wounds Patient Lines/Drains/Airways Status     Active Line/Drains/Airways  Name Placement date Placement time Site Days   Peripheral IV 20 G Left Antecubital --  --  Antecubital  --   Peripheral IV 04/20/23 20 G Right Antecubital 04/20/23  2207  Antecubital  1   Gastrostomy/Enterostomy Gastrostomy 22 Fr. LUQ 06/30/15  0050  LUQ  2852   External Urinary Catheter 12/09/22  1932  --  133            Intake/Output Last 24 hours  Intake/Output Summary (Last 24 hours) at 04/21/2023 1334 Last data filed at  04/21/2023 1307 Gross per 24 hour  Intake 2402.77 ml  Output 310 ml  Net 2092.77 ml    Labs/Imaging Results for orders placed or performed during the hospital encounter of 04/20/23 (from the past 48 hour(s))  Comprehensive metabolic panel     Status: Abnormal   Collection Time: 04/20/23  7:36 PM  Result Value Ref Range   Sodium 139 135 - 145 mmol/L   Potassium 3.7 3.5 - 5.1 mmol/L   Chloride 103 98 - 111 mmol/L   CO2 27 22 - 32 mmol/L   Glucose, Bld 223 (H) 70 - 99 mg/dL    Comment: Glucose reference range applies only to samples taken after fasting for at least 8 hours.   BUN 20 8 - 23 mg/dL   Creatinine, Ser 5.28 (H) 0.61 - 1.24 mg/dL   Calcium 8.0 (L) 8.9 - 10.3 mg/dL   Total Protein 5.4 (L) 6.5 - 8.1 g/dL   Albumin 2.5 (L) 3.5 - 5.0 g/dL   AST 17 15 - 41 U/L   ALT 12 0 - 44 U/L   Alkaline Phosphatase 45 38 - 126 U/L   Total Bilirubin 0.6 0.3 - 1.2 mg/dL   GFR, Estimated 53 (L) >60 mL/min    Comment: (NOTE) Calculated using the CKD-EPI Creatinine Equation (2021)    Anion gap 9 5 - 15    Comment: Performed at [facility], [address], [place], [place] [address]  Lipase, blood     Status: Abnormal   Collection Time: 04/20/23  7:36 PM  Result Value Ref Range   Lipase 53 (H) 11 - 51 U/L    Comment: Performed at [facility], [address], [place], [place] [address]  CBC with Differential     Status: Abnormal   Collection Time: 04/20/23  7:36 PM  Result Value Ref Range   WBC 5.1 4.0 - 10.5 K/uL   RBC 3.63 (L) 4.22 - 5.81 MIL/uL   Hemoglobin 10.5 (L) 13.0 - 17.0 g/dL   HCT 72.5 (L) 36.6 - 44.0 %   MCV 89.0 80.0 - 100.0 fL   MCH 28.9 26.0 - 34.0 pg   MCHC 32.5 30.0 - 36.0 g/dL   RDW 34.7 42.5 - 95.6 %   Platelets 291 150 - 400 K/uL   nRBC 0.0 0.0 - 0.2 %   Neutrophils Relative % 43 %   Neutro Abs 2.2 1.7 - 7.7 K/uL   Lymphocytes Relative 42 %   Lymphs Abs 2.1 0.7 - 4.0 K/uL   Monocytes Relative 12 %   Monocytes Absolute 0.6 0.1 - 1.0 K/uL    Eosinophils Relative 3 %   Eosinophils Absolute 0.1 0.0 - 0.5 K/uL   Basophils Relative 0 %   Basophils Absolute 0.0 0.0 - 0.1 K/uL   Immature Granulocytes 0 %   Abs Immature Granulocytes 0.01 0.00 - 0.07 K/uL    Comment: Performed at [facility], [address] [place], [place] [address]  Type and screen [name]     Status: None   Collection Time: 04/20/23  7:36 PM  Result Value Ref Range   ABO/RH(D) O POS    Antibody Screen NEG    Sample Expiration 04/23/2023,2359    Unit Number [id number]    Blood Component Type RBC LR PHER1    Unit division 00    Status of Unit ISSUED,FINAL    Transfusion Status OK TO TRANSFUSE    Crossmatch Result      Compatible Performed at [facility], [address], [place], [place] [address]   I-Stat Lactic Acid     Status: None   Collection Time: 04/20/23  7:42 PM  Result Value Ref Range   Lactic Acid, Venous 1.6 0.5 - 1.9 mmol/L  Prepare RBC     Status: None   Collection Time: 04/20/23  9:48 PM  Result Value Ref Range   Order Confirmation      ORDER PROCESSED BY BLOOD BANK Performed at [facility], [address] [place], [place] [address]   Urinalysis, Routine w reflex microscopic -Urine, Clean Catch     Status: Abnormal   Collection Time: 04/21/23 12:34 AM  Result Value Ref Range   Color, Urine YELLOW YELLOW   APPearance CLEAR CLEAR   Specific Gravity, Urine >1.046 (H) 1.005 - 1.030   pH 5.0 5.0 - 8.0   Glucose, UA NEGATIVE NEGATIVE mg/dL   Hgb urine dipstick NEGATIVE NEGATIVE   Bilirubin Urine NEGATIVE NEGATIVE   Ketones, ur NEGATIVE NEGATIVE mg/dL   Protein, ur NEGATIVE NEGATIVE mg/dL   Nitrite NEGATIVE NEGATIVE   Leukocytes,Ua NEGATIVE NEGATIVE    Comment: Performed at [facility], [address], [place], [place] [address]  Basic metabolic panel     Status: Abnormal   Collection Time: 04/21/23  3:03 AM  Result Value Ref Range   Sodium 138 135 - 145 mmol/L   Potassium 4.1 3.5 -  5.1 mmol/L   Chloride 106 98 - 111 mmol/L   CO2 27 22 - 32 mmol/L   Glucose, Bld 185 (H) 70 - 99 mg/dL    Comment: Glucose reference range applies only to samples taken after fasting for at least 8 hours.   BUN 22 8 - 23 mg/dL   Creatinine, Ser 4.69 (H) 0.61 - 1.24 mg/dL   Calcium 7.7 (L) 8.9 - 10.3 mg/dL   GFR, Estimated 55 (L) >60 mL/min    Comment: (NOTE) Calculated using the CKD-EPI Creatinine Equation (2021)    Anion gap 5 5 - 15    Comment: Performed at [facility], [address], [place], [place] [address]  CBC     Status: Abnormal   Collection Time: 04/21/23  3:03 AM  Result Value Ref Range   WBC 5.9 4.0 - 10.5 K/uL   RBC 3.30 (L) 4.22 - 5.81 MIL/uL   Hemoglobin 9.8 (L) 13.0 - 17.0 g/dL   HCT 84.1 (L) 32.4 - 40.1 %   MCV 89.4 80.0 - 100.0 fL   MCH 29.7 26.0 - 34.0 pg   MCHC 33.2 30.0 - 36.0 g/dL   RDW 02.7 25.3 - 66.4 %   Platelets 254 150 - 400 K/uL   nRBC 0.0 0.0 - 0.2 %    Comment: Performed at [facility], [address], [place], [place] [address]  Magnesium     Status: Abnormal   Collection  Time: 04/21/23  3:03 AM  Result Value Ref Range   Magnesium 1.6 (L) 1.7 - 2.4 mg/dL    Comment: Performed at [facility], [address], [place], [place] [address]  Phosphorus     Status: None   Collection Time: 04/21/23  3:03 AM  Result Value Ref Range   Phosphorus 4.4 2.5 - 4.6 mg/dL    Comment: Performed at [facility], [address], [place], [place] [address]  CBG monitoring, ED     Status: Abnormal   Collection Time: 04/21/23  7:41 AM  Result Value Ref Range   Glucose-Capillary 114 (H) 70 - 99 mg/dL    Comment: Glucose reference range applies only to samples taken after fasting for at least 8 hours.  Hemoglobin and hematocrit, blood     Status: Abnormal   Collection Time: 04/21/23 11:31 AM  Result Value Ref Range   Hemoglobin 9.7 (L) 13.0 - 17.0 g/dL   HCT 08.6 (L) 57.8 - 46.9 %    Comment: Performed at [facility], [address], [place], [place] [address]  CBG monitoring, ED     Status: Abnormal   Collection Time: 04/21/23 11:33 AM  Result Value Ref Range   Glucose-Capillary 69 (L) 70 - 99 mg/dL    Comment: Glucose reference range applies only to samples taken after fasting for at least 8 hours.  CBG monitoring, ED     Status: Abnormal   Collection Time: 04/21/23 11:35 AM  Result Value Ref Range   Glucose-Capillary 108 (H) 70 - 99 mg/dL    Comment: Glucose reference range applies only to samples taken after fasting for at least 8 hours.   CT ABDOMEN PELVIS W CONTRAST  Result Date: 04/20/2023 CLINICAL DATA:  Abdominal pain EXAM: CT ABDOMEN AND PELVIS WITH CONTRAST TECHNIQUE: Multidetector CT imaging of the abdomen and pelvis was performed using the standard protocol following bolus administration of intravenous contrast. RADIATION DOSE REDUCTION: This exam was performed according to the departmental dose-optimization program which includes automated exposure control, adjustment of the mA and/or kV according to patient size and/or use of iterative reconstruction technique. CONTRAST:  75mL OMNIPAQUE IOHEXOL 350 MG/ML SOLN COMPARISON:  12/09/2022 FINDINGS: Lower chest: Lung bases are clear. Hepatobiliary: Scattered hepatic cysts measuring up to 5.3 cm (series 3/image 13), benign. Gallbladder is unremarkable. No intrahepatic or extrahepatic ductal dilatation. Pancreas: Within normal limits. Spleen: Within normal limits. Adrenals/Urinary Tract: Adrenal glands are within normal limits. Kidneys are within normal limits.  No hydronephrosis. Bladder is underdistended with a small right posterolateral bladder diverticulum (series 3/image 69). Stomach/Bowel: Stomach is within normal limits. No evidence of bowel obstruction. Normal appendix (series 3/image 43). Mild focal wall thickening with enhancement along the left lateral rectum (series 3/image 68), equivocal. Vascular/Lymphatic: No evidence of abdominal aortic aneurysm.  Atherosclerotic calcifications of the abdominal aorta and branch vessels, although vessels remain patent. No suspicious abdominopelvic lymphadenopathy. Reproductive: Prostate is unremarkable. Other: No abdominopelvic ascites.  Trace presacral stranding. Mild fat in the left inguinal canal. Musculoskeletal: Mild degenerative changes at L3-4. IMPRESSION: No CT findings to account for the patient's abdominal pain. Mild focal wall thickening with enhancement along the left lateral rectum, equivocal. Consider colonoscopy as clinically warranted to assess for early colonic mass. Additional ancillary findings as above. Electronically Signed   By: Charline Bills M.D.   On: 04/20/2023 21:39    Pending Labs Unresulted Labs (From admission, onward)     Start  Ordered   04/22/23 0500  CBC with Differential/Platelet  Tomorrow morning,   R        04/21/23 0941   04/22/23 0500  Basic metabolic panel  Tomorrow morning,   R        04/21/23 0941   04/22/23 0500  Magnesium  Tomorrow morning,   R        04/21/23 0941   04/21/23 1104  Hemoglobin and hematocrit, blood  Now then every 8 hours,   R (with TIMED occurrences)     Comments: Doe not need the now order - just start 8 hours after last hgb- call mD for hgb less than 7.5    04/21/23 1104            Vitals/Pain Today's Vitals   04/21/23 0949 04/21/23 1115 04/21/23 1129 04/21/23 1307  BP:  113/60    Pulse:  75    Resp:  20    Temp:      TempSrc:      SpO2:  99%    Weight:      Height:      PainSc: 0-No pain  0-No pain 0-No pain    Isolation Precautions No active isolations  Medications Medications  lactated ringers infusion ( Intravenous New Bag/Given 04/21/23 1124)  amiodarone (PACERONE) tablet 200 mg (200 mg Oral Given 04/21/23 0742)  insulin aspart (novoLOG) injection 0-6 Units ( Subcutaneous Not Given 04/21/23 1139)  pantoprazole (PROTONIX) injection 40 mg (40 mg Intravenous Given 04/21/23 0738)  lactated ringers bolus 500 mL (0 mLs  Intravenous Stopped 04/20/23 2053)  pantoprazole (PROTONIX) injection 40 mg (40 mg Intravenous Given 04/20/23 1946)  acetaminophen (TYLENOL) tablet 650 mg (650 mg Oral Given 04/20/23 2017)  iohexol (OMNIPAQUE) 350 MG/ML injection 75 mL (75 mLs Intravenous Contrast Given 04/20/23 2110)  0.9 %  sodium chloride infusion (Manually program via Guardrails IV Fluids) (0 mLs Intravenous Stopped 04/20/23 2226)  lactated ringers bolus 500 mL (0 mLs Intravenous Stopped 04/20/23 2225)  lactated ringers bolus 1,000 mL (0 mLs Intravenous Stopped 04/21/23 0458)  potassium chloride 10 mEq in 100 mL IVPB (0 mEq Intravenous Stopped 04/21/23 0548)  magnesium sulfate IVPB 2 g 50 mL (0 g Intravenous Stopped 04/21/23 1100)    Mobility walks with device     Focused Assessments See chart   R Recommendations: See Admitting Provider Note  Report given to:   Additional Notes: see chart

## 2023-04-21 NOTE — Consult Note (Signed)
Consultation  Referring Provider: [facility]/ Hanley Ben Primary Care Physician:  Margit Hanks, MD (Inactive) Primary Gastroenterologist:  unassigned  Reason for Consultation:  rectal bleeding   HPI: Bill Jackson is a 88 y.o. male, nursing home resident, with history of prior hemorrhagic CVA with right hemiplegia, prior requirement for PEG tube, now on a dysphagia diet, history of adult onset diabetes mellitus, hyperlipidemia, and hypertension. Was brought to the emergency room after he had onset of rectal bleeding described as red blood both Monday and yesterday.  Patient says the stool was "nasty looking and dark and reddish. He has not had any further bleeding overnight, he does not recall any onset of abdominal pain over the past few days but says he has had some intermittent discomfort in his abdomen and points more toward his periumbilical area.  He is not aware of having any difficulty with diarrhea recently or constipation.  He denies any rectal pain or discomfort. 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  No prior history of GI bleeding. He is not anticoagulated 2D echo May 2024 with EF approximately 30%  Labs on arrival last evening WBC 5.1/hemoglobin 10.5/hematocrit 32.3/MCV 89/platelets 291 baseline hemoglobin appears to be in the 13 range from May 2024. BUN 20/creatinine 1.31 Albumin 2.5 LFTs within normal limits  Hgb this morning 9.8, he has not required transfusion  CT of the abdomen and pelvis done last evening with contrast scattered hepatic cysts up to 5.3 cm, unremarkable gallbladder unremarkable pancreas traction, there is mild focal wall thickening with enhancement along the left lateral rectum equivocal normal appendix no adenopathy, no diverticuli commented on   He has been hemodynamically  stable since arrival    Past Medical History:  Diagnosis Date   Diabetes mellitus    Dysphagia following cerebrovascular accident    PEG tube   Hemorrhagic stroke (HCC) 12/10/2022   Hyperlipidemia    Hypertension    Left shoulder pain    Right knee pain    Stroke (HCC) 10/01/2012   R side weakness, slurred speech, dizziness, visual changes, ataxia; hemorrhagic cerebellar CVA, evacuated    Past Surgical History:  Procedure Laterality Date   CARDIAC SURGERY     CRANIECTOMY N/A 10/25/2012   Procedure: Suboccipital Craniectomy for Evacuation of Cerebellar Hematoma;  Surgeon: Maeola Harman, MD;  Location: [facility];  Service: Neurosurgery;  Laterality: N/A;  Suboccipital Craniectomy for Evacuation of Cerebellar Hematoma   heart stents  x3   HERNIA REPAIR     PEG TUBE PLACEMENT     placed ?2014, changed 01/2014 (at [facility])   ROTATOR CUFF REPAIR      Prior to Admission medications   Medication Sig Start Date End Date Taking? Authorizing Provider  acetaminophen (TYLENOL) 325 MG tablet Take 650 mg by mouth every 6 (six) hours as needed for mild pain.   Yes [provider]  amiodarone (PACERONE) 200 MG tablet Take 1 tablet (200 mg total) by mouth daily. 12/17/22  Yes Noralee Stain, DO  ammonium lactate (LAC-HYDRIN) 12 % lotion Apply 1 Application topically 2 (two) times daily as needed for dry skin.   Yes [provider]  aspirin 81 MG chewable tablet Chew 81 mg by mouth daily.   Yes [provider]  brimonidine [facility])  0.2 % ophthalmic solution Place 1 drop into both eyes every 8 (eight) hours. 01/19/13  Yes Lucrezia Starch, NP  carvedilol (COREG) 6.25 MG tablet Take 6.25 mg by mouth 2 (two) times daily. 03/31/23  Yes [provider]  dorzolamide-timolol (COSOPT) 22.3-6.8 MG/ML ophthalmic solution Place 1 drop into both eyes 2 (two) times daily.   Yes [provider]  insulin glargine (LANTUS) 100 UNIT/ML injection Inject 10-20 Units into the skin  See admin instructions. If bs is  175 or below =10 units, if bs is 175 = 15 units. If bs is 200= 20 units   Yes [provider]  latanoprost (XALATAN) 0.005 % ophthalmic solution Place 1 drop into both eyes at bedtime. 01/19/13  Yes Lucrezia Starch, NP  loratadine (CLARITIN) 10 MG tablet Take 10 mg by mouth daily.   Yes [provider]  Multiple Vitamin (MULTIVITAMIN WITH MINERALS) TABS Take 1 tablet by mouth daily. 01/19/13  Yes Lucrezia Starch, NP  nicotine polacrilex (COMMIT) 2 MG lozenge Take 2 mg by mouth as needed for smoking cessation.   Yes [provider]  Nutritional Supplements (BOOST GLUCOSE CONTROL) LIQD Take 237 mLs by mouth with breakfast, with lunch, and with evening meal.   Yes [provider]  polyvinyl alcohol (LIQUIFILM TEARS) 1.4 % ophthalmic solution Place 1 drop into both eyes 4 (four) times daily as needed for dry eyes.   Yes [provider]  triamcinolone cream (KENALOG) 0.1 % Apply 1 Application topically 2 (two) times daily as needed (scalp lesion).   Yes [provider]    Current Facility-Administered Medications  Medication Dose Route Frequency Provider Last Rate Last Admin   amiodarone (PACERONE) tablet 200 mg  200 mg Oral Daily Segars, Christiane Ha, MD   200 mg at 04/21/23 0742   insulin aspart (novoLOG) injection 0-6 Units  0-6 Units Subcutaneous TID WC Dolly Rias, MD       lactated ringers infusion   Intravenous Continuous Laurence Spates, MD 75 mL/hr at 04/21/23 0033 New Bag at 04/21/23 0033   magnesium sulfate IVPB 2 g 50 mL  2 g Intravenous Once Glade Lloyd, MD 50 mL/hr at 04/21/23 1000 2 g at 04/21/23 1000   pantoprazole (PROTONIX) injection 40 mg  40 mg Intravenous Domenica Fail, MD   40 mg at 04/21/23 9604   Current Outpatient Medications  Medication Sig Dispense Refill   acetaminophen (TYLENOL) 325 MG tablet Take 650 mg by mouth every 6 (six) hours as needed for mild pain.     amiodarone  (PACERONE) 200 MG tablet Take 1 tablet (200 mg total) by mouth daily. 30 tablet 0   ammonium lactate (LAC-HYDRIN) 12 % lotion Apply 1 Application topically 2 (two) times daily as needed for dry skin.     aspirin 81 MG chewable tablet Chew 81 mg by mouth daily.     brimonidine (ALPHAGAN) 0.2 % ophthalmic solution Place 1 drop into both eyes every 8 (eight) hours. 5 mL 0   carvedilol (COREG) 6.25 MG tablet Take 6.25 mg by mouth 2 (two) times daily.     dorzolamide-timolol (COSOPT) 22.3-6.8 MG/ML ophthalmic solution Place 1 drop into both eyes 2 (two) times daily.     insulin glargine (LANTUS) 100 UNIT/ML injection Inject 10-20 Units into the skin See admin instructions. If bs is  175 or below =10 units, if bs is 175 = 15 units. If bs is 200= 20 units     latanoprost (XALATAN) 0.005 % ophthalmic solution  Place 1 drop into both eyes at bedtime. 2.5 mL 0   loratadine (CLARITIN) 10 MG tablet Take 10 mg by mouth daily.     Multiple Vitamin (MULTIVITAMIN WITH MINERALS) TABS Take 1 tablet by mouth daily.     nicotine polacrilex (COMMIT) 2 MG lozenge Take 2 mg by mouth as needed for smoking cessation.     Nutritional Supplements (BOOST GLUCOSE CONTROL) LIQD Take 237 mLs by mouth with breakfast, with lunch, and with evening meal.     polyvinyl alcohol (LIQUIFILM TEARS) 1.4 % ophthalmic solution Place 1 drop into both eyes 4 (four) times daily as needed for dry eyes.     triamcinolone cream (KENALOG) 0.1 % Apply 1 Application topically 2 (two) times daily as needed (scalp lesion).      Allergies as of 04/20/2023 - Review Complete 04/20/2023  Allergen Reaction Noted   Colestipol Other (See Comments) 08/20/2004   Ezetimibe Other (See Comments) 12/17/2004   Hydrocodone-acetaminophen Other (See Comments) 10/16/2002   Lisinopril Cough 01/19/2003   Lotensin [benazepril hcl] Cough 10/29/2012   Lovastatin Other (See Comments) 01/19/2003   Penicillins Other (See Comments) 12/05/2011   Simvastatin Other (See  Comments) 04/07/2002    Family History  Family history unknown: Yes    Social History   Socioeconomic History   Marital status: Divorced    Spouse name: Not on file   Number of children: Not on file   Years of education: Not on file   Highest education level: Not on file  Occupational History   Not on file  Tobacco Use   Smoking status: Every Day    Current packs/day: 0.00    Average packs/day: 0.3 packs/day for 25.0 years (6.3 ttl pk-yrs)    Types: Cigarettes    Start date: 04/03/1988    Last attempt to quit: 04/03/2013    Years since quitting: 10.0   Smokeless tobacco: Never  Substance and Sexual Activity   Alcohol use: No   Drug use: No   Sexual activity: Not on file  Other Topics Concern   Not on file  Social History Narrative   Not on file   Social Determinants of Health   Financial Resource Strain: Not on file  Food Insecurity: No Food Insecurity (12/09/2022)   Hunger Vital Sign    Worried About Running Out of Food in the Last Year: Never true    Ran Out of Food in the Last Year: Never true  Transportation Needs: No Transportation Needs (12/09/2022)   PRAPARE - Administrator, Civil Service (Medical): No    Lack of Transportation (Non-Medical): No  Physical Activity: Not on file  Stress: Not on file  Social Connections: Not on file  Intimate Partner Violence: Not At Risk (12/09/2022)   Humiliation, Afraid, Rape, and Kick questionnaire    Fear of Current or Ex-Partner: No    Emotionally Abused: No    Physically Abused: No    Sexually Abused: No    Review of Systems: Pertinent positive and negative review of systems were noted in the above HPI section.  All other review of systems was otherwise negative.   Physical Exam: Vital signs in last 24 hours: Temp:  [97.7 F (36.5 C)-98.6 F (37 C)] 98.2 F (36.8 C) (09/18 0938) Pulse Rate:  [54-110] 63 (09/18 0938) Resp:  [11-22] 16 (09/18 0830) BP: (86-154)/(53-136) 147/81 (09/18 0938) SpO2:  [96  %-100 %] 100 % (09/18 0938) Weight:  [83 kg] 83 kg (09/17 1916)  General:   Alert,  Well-developed, well-nourished elderly African-American male, pleasant and cooperative in NAD Head:  Normocephalic and atraumatic. Eyes:  Sclera clear, no icterus.   Right eye clouded Ears:  Normal auditory acuity. Nose:  No deformity, discharge,  or lesions. Mouth:  No deformity or lesions.   Neck:  Supple; no masses or thyromegaly. Lungs:  Clear throughout to auscultation.   No wheezes, crackles, or rhonchi.  Heart:  Regular rate and rhythm; no murmurs, clicks, rubs,  or gallops. Abdomen:  Soft, there is some mild tenderness in the lower mid abdomen, no guarding or rebound, BS active,nonpalp mass or hsm.   Rectal: Not done, done per ER MD with dark red loose stool noted Msk:  Symmetrical without gross deformities. . Pulses:  Normal pulses noted. Extremities:  Without clubbing or edema. Neurologic:  Alert and  oriented x4; right sided hemiparesis, patient is nonambulatory Skin:  Intact without significant lesions or rashes.. Psych:  Alert and cooperative. Normal mood and affect.  Intake/Output from previous day: 09/17 0701 - 09/18 0700 In: 2352.8 [I.V.:110; Blood:315; IV Piggyback:1927.8] Out: 100 [Urine:100] Intake/Output this shift: No intake/output data recorded.  Lab Results: Recent Labs    04/20/23 1936 04/21/23 0303  WBC 5.1 5.9  HGB 10.5* 9.8*  HCT 32.3* 29.5*  PLT 291 254   BMET Recent Labs    04/20/23 1936 04/21/23 0303  NA 139 138  K 3.7 4.1  CL 103 106  CO2 27 27  GLUCOSE 223* 185*  BUN 20 22  CREATININE 1.31* 1.27*  CALCIUM 8.0* 7.7*   LFT Recent Labs    04/20/23 1936  PROT 5.4*  ALBUMIN 2.5*  AST 17  ALT 12  ALKPHOS 45  BILITOT 0.6   PT/INR No results for input(s): "LABPROT", "INR" in the last 72 hours. Hepatitis Panel No results for input(s): "HEPBSAG", "HCVAB", "HEPAIGM", "HEPBIGM" in the last 72 hours.    IMPRESSION:  #56 88 year old  African-American male, nursing home resident with history of previous hemorrhagic CVA with right-sided hemiparesis, who presents after 2 episodes of dark red bloody bowel movements at the nursing home. Has no prior history of GI bleeding, no anticoagulation Uncertain regarding prior colonoscopy though nothing I can see in our system or care everywhere  CT done last evening shows mild focal wall thickening and enhancement left lateral rectum otherwise unremarkable no diverticuli commented on.  He has a normocytic anemia, down about 3 g from his baseline hemoglobin.  Etiology of his bleeding is not clear appears to be lower,,  may be diverticular, rule out occult neoplasm, rule out rectal neoplasm, rule out AVM or other vascular lesion  #2 nonambulatory #3 adult onset diabetes mellitus #4.  Hypertension #5.  Congestive heart failure/cardiomyopathy with EF of about 30% on May 2024  PLAN:  Liquid diet today Serial hemoglobins every 8 hours and transfuse as indicated for hemoglobin 7 or less Colonoscopy is indicated, I discussed colonoscopy at length with the patient including indications risks and benefits and he would like to discuss with his daughter before committing.  She is here in [place] and he will talk with her later this morning. We can schedule him for colonoscopy tomorrow with prep later this evening if he decides to proceed. GI will follow with you    Angelamarie Avakian PA-C 04/21/2023, 10:33 AM

## 2023-04-21 NOTE — Progress Notes (Signed)
Patient admitted early this morning for rectal bleeding.  Patient seen and examined at bedside.  I have reviewed patient's medical records including this morning's H&P, current vitals, labs and medications myself.  Hemoglobin 10.5 on presentation.  Hemoglobin 9.8 this morning.  Monitor H&H.  ED provider has discussed with [facility] on presentation.  Follow GI recommendations.

## 2023-04-21 NOTE — ED Notes (Signed)
Patient turned, cleaned, and changed.

## 2023-04-21 NOTE — ED Notes (Signed)
Helped patient with the urinal patient is resting with call bell in reach

## 2023-04-22 ENCOUNTER — Encounter (HOSPITAL_COMMUNITY)
Admission: EM | Disposition: A | Discharge status: Skilled Nursing Facility | Payer: Self-pay | Source: Skilled Nursing Facility | Attending: Internal Medicine

## 2023-04-22 DIAGNOSIS — K625 Hemorrhage of anus and rectum: Secondary | ICD-10-CM

## 2023-04-22 DIAGNOSIS — R933 Abnormal findings on diagnostic imaging of other parts of digestive tract: Secondary | ICD-10-CM

## 2023-04-22 DIAGNOSIS — D62 Acute posthemorrhagic anemia: Secondary | ICD-10-CM

## 2023-04-22 DIAGNOSIS — K922 Gastrointestinal hemorrhage, unspecified: Secondary | ICD-10-CM | POA: Diagnosis not present

## 2023-04-22 DIAGNOSIS — K921 Melena: Secondary | ICD-10-CM

## 2023-04-22 HISTORY — PX: FLEXIBLE SIGMOIDOSCOPY: SHX5431

## 2023-04-22 LAB — GLUCOSE, CAPILLARY
Glucose-Capillary: 141 mg/dL — ABNORMAL HIGH (ref 70–99)
Glucose-Capillary: 190 mg/dL — ABNORMAL HIGH (ref 70–99)
Glucose-Capillary: 152 mg/dL — ABNORMAL HIGH (ref 70–99)

## 2023-04-22 LAB — CBC WITH DIFFERENTIAL/PLATELET
Abs Immature Granulocytes: 0.01 10*3/uL (ref 0.00–0.07)
Basophils Absolute: 0 10*3/uL (ref 0.0–0.1)
Basophils Relative: 1 %
Eosinophils Absolute: 0.2 10*3/uL (ref 0.0–0.5)
Eosinophils Relative: 4 %
HCT: 26.5 % — ABNORMAL LOW (ref 39.0–52.0)
Hemoglobin: 8.9 g/dL — ABNORMAL LOW (ref 13.0–17.0)
Immature Granulocytes: 0 %
Lymphocytes Relative: 35 %
Lymphs Abs: 1.5 10*3/uL (ref 0.7–4.0)
MCH: 28.8 pg (ref 26.0–34.0)
MCHC: 33.6 g/dL (ref 30.0–36.0)
MCV: 85.8 fL (ref 80.0–100.0)
Monocytes Absolute: 0.5 10*3/uL (ref 0.1–1.0)
Monocytes Relative: 11 %
Neutro Abs: 2.2 10*3/uL (ref 1.7–7.7)
Neutrophils Relative %: 49 %
Platelets: 229 10*3/uL (ref 150–400)
RBC: 3.09 MIL/uL — ABNORMAL LOW (ref 4.22–5.81)
RDW: 13.9 % (ref 11.5–15.5)
WBC: 4.4 10*3/uL (ref 4.0–10.5)
nRBC: 0 % (ref 0.0–0.2)

## 2023-04-22 LAB — BASIC METABOLIC PANEL
Anion gap: 7 (ref 5–15)
BUN: 16 mg/dL (ref 8–23)
CO2: 27 mmol/L (ref 22–32)
Calcium: 8 mg/dL — ABNORMAL LOW (ref 8.9–10.3)
Chloride: 106 mmol/L (ref 98–111)
Creatinine, Ser: 1.26 mg/dL — ABNORMAL HIGH (ref 0.61–1.24)
GFR, Estimated: 56 mL/min — ABNORMAL LOW (ref >60)
Glucose, Bld: 203 mg/dL — ABNORMAL HIGH (ref 70–99)
Potassium: 3.8 mmol/L (ref 3.5–5.1)
Sodium: 140 mmol/L (ref 135–145)

## 2023-04-22 LAB — HEMOGLOBIN AND HEMATOCRIT, BLOOD
HCT: 26 % — ABNORMAL LOW (ref 39.0–52.0)
HCT: 25.2 % — ABNORMAL LOW (ref 39.0–52.0)
Hemoglobin: 8.8 g/dL — ABNORMAL LOW (ref 13.0–17.0)
Hemoglobin: 8.5 g/dL — ABNORMAL LOW (ref 13.0–17.0)

## 2023-04-22 LAB — MRSA NEXT GEN BY PCR, NASAL: MRSA by PCR Next Gen: NOT DETECTED

## 2023-04-22 LAB — MAGNESIUM: Magnesium: 1.7 mg/dL (ref 1.7–2.4)

## 2023-04-22 SURGERY — SIGMOIDOSCOPY, FLEXIBLE

## 2023-04-22 MED ORDER — SODIUM CHLORIDE 0.9 % IV SOLN: INTRAVENOUS | Status: DC

## 2023-04-22 MED ORDER — FLEET ENEMA RE ENEM
ENEMA | RECTAL | Status: DC | PRN
  Administered 2023-04-22: 1 via RECTAL

## 2023-04-22 MED ORDER — FLEET ENEMA RE ENEM
1.0000 | ENEMA | Freq: Once | RECTAL | Status: DC
  Filled 2023-04-22: qty 1

## 2023-04-22 MED ORDER — FLEET ENEMA RE ENEM
ENEMA | RECTAL | Status: AC
  Filled 2023-04-22: qty 1

## 2023-04-22 NOTE — Evaluation (Signed)
Physical Therapy Evaluation Patient Details Name: Bill Jackson MRN: [id number] DOB: [birth year] Today's Date: 04/22/2023  History of Present Illness  88 y.o. male admitted 9/17 from [facility] with GIB. PMhx; CVA with Rt -sided hemiparesis and dysarthria, HFrEF, Afib, DM, HTN, glaucoma, neuropathy, GERD  Clinical Impression  Pt pleasant and reports receiving therapy at [facility] with progression to gait with WC following for safety. Pt able to transfer to standing and step toward [facility] today with min assist. Pt with decreased strength, balance and functional mobility who will benefit from acute therapy to maximize mobility and safety. Encouraged OOB daily for meals with staff. Patient will benefit from continued inpatient follow up therapy, <3 hours/day         If plan is discharge home, recommend the following: A lot of help with walking and/or transfers;A lot of help with bathing/dressing/bathroom;Assist for transportation;Supervision due to cognitive status;Direct supervision/assist for medications management;Assistance with cooking/housework   Can travel by private vehicle   No    Equipment Recommendations None recommended by PT  Recommendations for Other Services       Functional Status Assessment Patient has had a recent decline in their functional status and/or demonstrates limited ability to make significant improvements in function in a reasonable and predictable amount of time     Precautions / Restrictions Precautions Precautions: Fall      Mobility  Bed Mobility Overal bed mobility: Needs Assistance Bed Mobility: Supine to Sit, Sit to Supine     Supine to sit: Min assist Sit to supine: Contact guard assist   General bed mobility comments: min HHA to rise from surface with min cues and increased time. pt able to return to supine without assist    Transfers Overall transfer level: Needs assistance   Transfers: Sit to/from Stand Sit to Stand: Min  assist           General transfer comment: min assist to rise from surface x 2 trials, pt able to side step to [facility] with RW with CGA and cues for direction. Unable to pivot to chair as pt being picked up by transport    Ambulation/Gait               General Gait Details: unable to attempt at this time as pt going for procedure  Stairs            Wheelchair Mobility     Tilt Bed    Modified Rankin (Stroke Patients Only)       Balance Overall balance assessment: Needs assistance Sitting-balance support: No upper extremity supported, Feet supported Sitting balance-Leahy Scale: Good     Standing balance support: Bilateral upper extremity supported, Reliant on assistive device for balance Standing balance-Leahy Scale: Poor Standing balance comment: Rw in standing                             Pertinent Vitals/Pain Pain Assessment Pain Assessment: No/denies pain    Home Living Family/patient expects to be discharged to:: Skilled nursing facility                        Prior Function Prior Level of Function : Needs assist       Physical Assist : Mobility (physical);ADLs (physical) Mobility (physical): Bed mobility;Transfers;Gait   Mobility Comments: walks with RW with assist and wC follow for fatigue, one person assist for transfers and at times able to  complete on his own ADLs Comments: assist for ADLs, staff does IADLs     Extremity/Trunk Assessment   Upper Extremity Assessment Upper Extremity Assessment: Generalized weakness    Lower Extremity Assessment Lower Extremity Assessment: Generalized weakness    Cervical / Trunk Assessment Cervical / Trunk Assessment: Normal  Communication      Cognition Arousal: Alert Behavior During Therapy: Flat affect Overall Cognitive Status: Impaired/Different from baseline Area of Impairment: Orientation, Memory, Safety/judgement                 Orientation Level: Disoriented  to, Time   Memory: Decreased short-term memory   Safety/Judgement: Decreased awareness of deficits              General Comments      Exercises     Assessment/Plan    PT Assessment Patient needs continued PT services  PT Problem List Decreased activity tolerance;Decreased balance;Decreased knowledge of use of DME;Decreased mobility       PT Treatment Interventions DME instruction;Gait training;Functional mobility training;Therapeutic activities;Patient/family education;Balance training;Therapeutic exercise    PT Goals (Current goals can be found in the Care Plan section)  Acute Rehab PT Goals Patient Stated Goal: "play football" PT Goal Formulation: With patient Time For Goal Achievement: 05/06/23 Potential to Achieve Goals: Fair    Frequency Min 1X/week     Co-evaluation               AM-PAC PT "6 Clicks" Mobility  Outcome Measure Help needed turning from your back to your side while in a flat bed without using bedrails?: A Little Help needed moving from lying on your back to sitting on the side of a flat bed without using bedrails?: A Little Help needed moving to and from a bed to a chair (including a wheelchair)?: A Little Help needed standing up from a chair using your arms (e.g., wheelchair or bedside chair)?: A Little Help needed to walk in hospital room?: A Lot Help needed climbing 3-5 steps with a railing? : Total 6 Click Score: 15    End of Session   Activity Tolerance: Patient tolerated treatment well Patient left: in bed;with call bell/phone within reach;with nursing/sitter in room Nurse Communication: Mobility status PT Visit Diagnosis: Other abnormalities of gait and mobility (R26.89);Muscle weakness (generalized) (M62.81)    Time: 1610-9604 PT Time Calculation (min) (ACUTE ONLY): 17 min   Charges:   PT Evaluation $PT Eval Moderate Complexity: 1 Mod   PT General Charges $$ ACUTE PT VISIT: 1 Visit         Merryl Hacker, PT Acute  Rehabilitation Services Office: [phone]   [name] 04/22/2023, 2:00 PM

## 2023-04-22 NOTE — Progress Notes (Signed)
[facility] ROUNDING NOTE   Subjective: Doing well today.  Did pass some stool earlier that was bloody.  Hgb stable.  No other complaints.   Objective: Vital signs in last 24 hours: Temp:  [97.5 F (36.4 C)-98.2 F (36.8 C)] 97.5 F (36.4 C) (09/19 1208) Pulse Rate:  [59-67] 67 (09/19 1208) Resp:  [16-18] 18 (09/19 1208) BP: (115-132)/(57-72) 124/63 (09/19 1208) SpO2:  [100 %] 100 % (09/19 1208) Weight:  [76 kg-77.5 kg] 77.5 kg (09/19 0421) Last BM Date : 04/21/23 General: NAD Lungs:  CTA b/l, no w/r/r Heart:  RRR, no m/r/g Abdomen:  Soft, NT, ND, +BS Rectal:  Maroon blood around anus, no external hemorrhoids or fissures.  Digital rectal exam with normal tone, elongated anal canal.  No rectal mass present.  Maroon blood on glove.    Intake/Output from previous day: 09/18 0701 - 09/19 0700 In: 2330.8 [I.V.:2280.8; IV Piggyback:50] Out: 1285 [Urine:1285] Intake/Output this shift: Total I/O In: 236 [P.O.:236] Out: 850 [Urine:850]   Lab Results: Recent Labs    04/20/23 1936 04/21/23 0303 04/21/23 1131 04/21/23 1927 04/22/23 1126 04/22/23 1129  WBC 5.1 5.9  --   --  4.4  --   HGB 10.5* 9.8*   < > 9.4* 8.9* 8.8*  PLT 291 254  --   --  229  --   MCV 89.0 89.4  --   --  85.8  --    < > = values in this interval not displayed.   BMET Recent Labs    04/20/23 1936 04/21/23 0303 04/22/23 1126  NA 139 138 140  K 3.7 4.1 3.8  CL 103 106 106  CO2 27 27 27   GLUCOSE 223* 185* 203*  BUN 20 22 16   CREATININE 1.31* 1.27* 1.26*  CALCIUM 8.0* 7.7* 8.0*   LFT Recent Labs    04/20/23 1936  PROT 5.4*  ALBUMIN 2.5*  AST 17  ALT 12  ALKPHOS 45  BILITOT 0.6   PT/INR No results for input(s): "INR" in the last 72 hours.    Imaging/Other results: CT ABDOMEN PELVIS W CONTRAST  Result Date: 04/20/2023 CLINICAL DATA:  Abdominal pain EXAM: CT ABDOMEN AND PELVIS WITH CONTRAST TECHNIQUE: Multidetector CT imaging of the abdomen and pelvis was performed  using the standard protocol following bolus administration of intravenous contrast. RADIATION DOSE REDUCTION: This exam was performed according to the departmental dose-optimization program which includes automated exposure control, adjustment of the mA and/or kV according to patient size and/or use of iterative reconstruction technique. CONTRAST:  75mL OMNIPAQUE IOHEXOL 350 MG/ML SOLN COMPARISON:  12/09/2022 FINDINGS: Lower chest: Lung bases are clear. Hepatobiliary: Scattered hepatic cysts measuring up to 5.3 cm (series 3/image 13), benign. Gallbladder is unremarkable. No intrahepatic or extrahepatic ductal dilatation. Pancreas: Within normal limits. Spleen: Within normal limits. Adrenals/Urinary Tract: Adrenal glands are within normal limits. Kidneys are within normal limits.  No hydronephrosis. Bladder is underdistended with a small right posterolateral bladder diverticulum (series 3/image 69). Stomach/Bowel: Stomach is within normal limits. No evidence of bowel obstruction. Normal appendix (series 3/image 43). Mild focal wall thickening with enhancement along the left lateral rectum (series 3/image 68), equivocal. Vascular/Lymphatic: No evidence of abdominal aortic aneurysm. Atherosclerotic calcifications of the abdominal aorta and branch vessels, although vessels remain patent. No suspicious abdominopelvic lymphadenopathy. Reproductive: Prostate is unremarkable. Other: No abdominopelvic ascites.  Trace presacral stranding. Mild fat in the left inguinal canal. Musculoskeletal: Mild degenerative changes at L3-4. IMPRESSION: No CT findings to account for the patient's abdominal  pain. Mild focal wall thickening with enhancement along the left lateral rectum, equivocal. Consider colonoscopy as clinically warranted to assess for early colonic mass. Additional ancillary findings as above. Electronically Signed   By: Charline Bills M.D.   On: 04/20/2023 21:39      Assessment and Plan:  88 year old male with  history of hemorrhagic stroke, atrial fibrillation and chronic systolic heart failure, residing in nursing home admitted to the hospital after 2 days of recurrent bloody bowel movements.   His hemoglobin was down from his baseline but has remained stable since yesterday.  He passed another bloody bowel movement today, but with much less blood than previous. The CT showed questionable rectal thickening.  Unclear if this is artifact or represents mass lesion. After further consideration and discussion with the patient, we decided to pursue an unsedated flexible sigmoidoscopy today to further evaluate this rectal abnormality.  This will allow [place] to safely exclude a mass lesion without risks of sedation.  This was discussed with the patient's daughter, Misty Stanley, as well who agreed.  Hematochezia/LGIB/Abnormal CT - Unsedated flexible sigmoidoscopy today - If unremarkable, and patient continues to bleed, will reconsider full colonoscopy. - Further recs pending results of flex sig  Jenel Lucks, MD  04/22/2023, 1:40 PM [facility]

## 2023-04-22 NOTE — Op Note (Signed)
[facility] Patient Name: Bill Jackson Procedure Date : 04/22/2023 MRN: [id number] Attending MD: Dub Amis. Tomasa Rand , MD, [id number] Date of Birth: Dec 06, 1934 CSN: [id number] Age: 88 Admit Type: Inpatient Procedure:                Flexible Sigmoidoscopy Indications:              Hematochezia, Abnormal CT of the GI tract Providers:                Lorin Picket E. Tomasa Rand, MD, Stephens Shire RN, RN,                            Salley Scarlet, Technician Referring MD:              Medicines:                None Complications:            No immediate complications. Estimated Blood Loss:     Estimated blood loss: none. Procedure:                Pre-Anesthesia Assessment:                           - Prior to the procedure, a History and Physical                            was performed, and patient medications and                            allergies were reviewed. The patient's tolerance of                            previous anesthesia was also reviewed. The risks                            and benefits of the procedure and the sedation                            options and risks were discussed with the patient.                            All questions were answered, and informed consent                            was obtained. Prior Anticoagulants: The patient has                            taken no anticoagulant or antiplatelet agents. ASA                            Grade Assessment: III - A patient with severe                            systemic disease. After reviewing the risks and  benefits, the patient was deemed in satisfactory                            condition to undergo the procedure.                           After obtaining informed consent, the scope was                            passed under direct vision. The [id number]-[id number] ([id number])                            Olympus endoscope was introduced through the anus                            and  advanced to the 30 cm from the anal verge. The                            flexible sigmoidoscopy was accomplished without                            difficulty. The patient tolerated the procedure                            well. The quality of the bowel preparation was                            adequate. Scope In: 2:30:58 PM Scope Out: 2:34:56 PM Scope Withdrawal Time: 0 hours 0 minutes 1 second  Total Procedure Duration: 0 hours 3 minutes 58 seconds  Findings:      The perianal and digital rectal examinations were normal. Pertinent       negatives include normal sphincter tone and no palpable rectal lesions.      Dark/maroon blood was found in the rectum and in the sigmoid colon.      The exam was otherwise without abnormality.      The retroflexed view of the distal rectum and anal verge was normal and       showed no anal or rectal abnormalities. Impression:               - Blood in the rectum and in the sigmoid colon.                           - The examination was otherwise normal.                           - No specimens collected.                           - The abnormal appearing rectum on CT was likely                            artifact. No mass lesion or proctitis present.                           -  Clinical history of suggestive diverticular bleed                            (painless,brisk), although diverticulosis noted on                            CT Moderate Sedation:      N/A Recommendation:           - Return patient to hospital ward for ongoing care.                           - Continue observative/supportive care for now.                           - Can reconsider colonoscopy if patient continues                            to show evidence of bleeding.                           - Ok for full liquid diet today                           - Trend CBC daily Procedure Code(s):        --- Professional ---                           [phone], Sigmoidoscopy, flexible;  diagnostic,                            including collection of specimen(s) by brushing or                            washing, when performed (separate procedure) Diagnosis Code(s):        --- Professional ---                           K62.5, Hemorrhage of anus and rectum                           K92.2, Gastrointestinal hemorrhage, unspecified                           K92.1, Melena (includes Hematochezia)                           R93.3, Abnormal findings on diagnostic imaging of                            other parts of digestive tract CPT copyright 2022 [employer]. All rights reserved. The codes documented in this report are preliminary and upon coder review may  be revised to meet current compliance requirements. Elijahjames Fuelling E. Tomasa Rand, MD 04/22/2023 2:45:22 PM This report has been signed electronically. Number of Addenda: 0

## 2023-04-22 NOTE — Progress Notes (Signed)
PROGRESS NOTE    Bill Jackson  HYQ:[id number] DOB: Nov 12, 1934 DOA: 04/20/2023 PCP: Margit Hanks, MD (Inactive)   Brief Narrative:  88 y.o. male with hx of history hemorrhagic CVA with evacuation, residual right-sided hemiparesis and dysarthria, heart failure with reduced ejection fraction, A-fib, diabetes, hypertension presented with rectal bleeding and lightheadedness.  On presentation, hemoglobin was 10.5.  CT of abdomen and pelvis showed questionable focal thickening along the left proximal rectum, but no diverticulosis.  GI was consulted.  Assessment & Plan:   Probable lower GI bleeding presenting with rectal bleeding Acute blood loss anemia -Presented with rectal bleeding and hemoglobin of 10.5.  Received 1 unit packed red cell transfusion on admission.  Hemoglobin 9.4 on 04/21/2023.  Pending for today. -GI following and patient is currently being managed conservatively.  Continue to monitor H&H.  Diet advancement as per GI.  Also on IV Protonix.  CAD with history of PCI -Stable.  Outpatient follow-up with cardiology  History of hemorrhagic stroke with evacuation with chronic right-sided hemiparesis and dysarthria -Currently stable.  Outpatient follow-up with neurology  Paroxysmal A-fib -Currently mostly rate controlled.  Continue amiodarone.  Outpatient follow-up with cardiology  Chronic systolic heart failure Hypertension -Echo in 12/2022 showed EF of 30%.  Coreg on hold.  Not on diuretics at home.  Strict input and output.  Daily weights.  Outpatient follow-up with cardiology.  Diabetes mellitus type 2 with hypoglycemia and hyperglycemia -Long-acting insulin on hold.  Continue CBGs with SSI.   DVT prophylaxis: SCDs Code Status: DNR Family Communication: None at bedside Disposition Plan: Status is: Inpatient Remains inpatient appropriate because: Of severity of illness    Consultants: GI  Procedures: None  Antimicrobials: None   Subjective: Patient seen  and examined at bedside.  No fever, vomiting, agitation reported.  Objective: Vitals:   04/21/23 1603 04/21/23 1922 04/22/23 0421 04/22/23 0802  BP: 131/70 132/72 (!) 115/57 122/64  Pulse: 65 (!) 59 66 66  Resp: 16 18 18 17   Temp: 98 F (36.7 C) 98.1 F (36.7 C) 98.2 F (36.8 C) 97.8 F (36.6 C)  TempSrc: Oral Oral Oral Oral  SpO2: 100% 100% 100% 100%  Weight:   77.5 kg   Height:        Intake/Output Summary (Last 24 hours) at 04/22/2023 1112 Last data filed at 04/22/2023 1014 Gross per 24 hour  Intake 2516.82 ml  Output 1935 ml  Net 581.82 ml   Filed Weights   04/20/23 1916 04/21/23 1502 04/22/23 0421  Weight: 83 kg 76 kg 77.5 kg    Examination:  General exam: Appears calm and comfortable.  On room air.  Elderly male lying in bed.  Chronically ill and deconditioned looking. Respiratory system: Bilateral decreased breath sounds at bases Cardiovascular system: S1 & S2 heard, Rate controlled Gastrointestinal system: Abdomen is nondistended, soft and nontender. Normal bowel sounds heard. Extremities: No cyanosis, clubbing; trace lower extremity edema Central nervous system: Awake, slow to respond, right-sided hemiparesis present, speech is difficult to comprehend.   Skin: No rashes, lesions or ulcers Psychiatry: Flat affect.  Not agitated.   Data Reviewed: I have personally reviewed following labs and imaging studies  CBC: Recent Labs  Lab 04/20/23 1936 04/21/23 0303 04/21/23 1131 04/21/23 1927  WBC 5.1 5.9  --   --   NEUTROABS 2.2  --   --   --   HGB 10.5* 9.8* 9.7* 9.4*  HCT 32.3* 29.5* 30.0* 28.5*  MCV 89.0 89.4  --   --  PLT 291 254  --   --    Basic Metabolic Panel: Recent Labs  Lab 04/20/23 1936 04/21/23 0303  NA 139 138  K 3.7 4.1  CL 103 106  CO2 27 27  GLUCOSE 223* 185*  BUN 20 22  CREATININE 1.31* 1.27*  CALCIUM 8.0* 7.7*  MG  --  1.6*  PHOS  --  4.4   GFR: Estimated Creatinine Clearance: 45.8 mL/min (A) (by C-G formula based on SCr of  1.27 mg/dL (H)). Liver Function Tests: Recent Labs  Lab 04/20/23 1936  AST 17  ALT 12  ALKPHOS 45  BILITOT 0.6  PROT 5.4*  ALBUMIN 2.5*   Recent Labs  Lab 04/20/23 1936  LIPASE 53*   No results for input(s): "AMMONIA" in the last 168 hours. Coagulation Profile: No results for input(s): "INR", "PROTIME" in the last 168 hours. Cardiac Enzymes: No results for input(s): "CKTOTAL", "CKMB", "CKMBINDEX", "TROPONINI" in the last 168 hours. BNP (last 3 results) No results for input(s): "PROBNP" in the last 8760 hours. HbA1C: No results for input(s): "HGBA1C" in the last 72 hours. CBG: Recent Labs  Lab 04/21/23 1133 04/21/23 1135 04/21/23 1641 04/21/23 1935 04/22/23 0732  GLUCAP 69* 108* 179* 191* 141*   Lipid Profile: No results for input(s): "CHOL", "HDL", "LDLCALC", "TRIG", "CHOLHDL", "LDLDIRECT" in the last 72 hours. Thyroid Function Tests: No results for input(s): "TSH", "T4TOTAL", "FREET4", "T3FREE", "THYROIDAB" in the last 72 hours. Anemia Panel: No results for input(s): "VITAMINB12", "FOLATE", "FERRITIN", "TIBC", "IRON", "RETICCTPCT" in the last 72 hours. Sepsis Labs: Recent Labs  Lab 04/20/23 1942  LATICACIDVEN 1.6    Recent Results (from the past 240 hour(s))  MRSA Next Gen by PCR, Nasal     Status: None   Collection Time: 04/21/23  3:18 PM   Specimen: Nasal Mucosa; Nasal Swab  Result Value Ref Range Status   MRSA by PCR Next Gen NOT DETECTED NOT DETECTED Final    Comment: (NOTE) The GeneXpert MRSA Assay (FDA approved for NASAL specimens only), is one component of a comprehensive MRSA colonization surveillance program. It is not intended to diagnose MRSA infection nor to guide or monitor treatment for MRSA infections. Test performance is not FDA approved in patients less than 69 years old. Performed at [facility], [address], [place], [place] [address]          Radiology Studies: CT ABDOMEN PELVIS W CONTRAST  Result Date:  04/20/2023 CLINICAL DATA:  Abdominal pain EXAM: CT ABDOMEN AND PELVIS WITH CONTRAST TECHNIQUE: Multidetector CT imaging of the abdomen and pelvis was performed using the standard protocol following bolus administration of intravenous contrast. RADIATION DOSE REDUCTION: This exam was performed according to the departmental dose-optimization program which includes automated exposure control, adjustment of the mA and/or kV according to patient size and/or use of iterative reconstruction technique. CONTRAST:  75mL OMNIPAQUE IOHEXOL 350 MG/ML SOLN COMPARISON:  12/09/2022 FINDINGS: Lower chest: Lung bases are clear. Hepatobiliary: Scattered hepatic cysts measuring up to 5.3 cm (series 3/image 13), benign. Gallbladder is unremarkable. No intrahepatic or extrahepatic ductal dilatation. Pancreas: Within normal limits. Spleen: Within normal limits. Adrenals/Urinary Tract: Adrenal glands are within normal limits. Kidneys are within normal limits.  No hydronephrosis. Bladder is underdistended with a small right posterolateral bladder diverticulum (series 3/image 69). Stomach/Bowel: Stomach is within normal limits. No evidence of bowel obstruction. Normal appendix (series 3/image 43). Mild focal wall thickening with enhancement along the left lateral rectum (series 3/image 68), equivocal. Vascular/Lymphatic: No evidence of abdominal  aortic aneurysm. Atherosclerotic calcifications of the abdominal aorta and branch vessels, although vessels remain patent. No suspicious abdominopelvic lymphadenopathy. Reproductive: Prostate is unremarkable. Other: No abdominopelvic ascites.  Trace presacral stranding. Mild fat in the left inguinal canal. Musculoskeletal: Mild degenerative changes at L3-4. IMPRESSION: No CT findings to account for the patient's abdominal pain. Mild focal wall thickening with enhancement along the left lateral rectum, equivocal. Consider colonoscopy as clinically warranted to assess for early colonic mass. Additional  ancillary findings as above. Electronically Signed   By: Charline Bills M.D.   On: 04/20/2023 21:39        Scheduled Meds:  amiodarone  200 mg Oral Daily   influenza vaccine adjuvanted  0.5 mL Intramuscular Tomorrow-1000   insulin aspart  0-6 Units Subcutaneous TID WC   pantoprazole (PROTONIX) IV  40 mg Intravenous Q12H   Continuous Infusions:  lactated ringers 75 mL/hr at 04/22/23 0106          Glade Lloyd, MD [facility] 04/22/2023, 11:12 AM

## 2023-04-23 DIAGNOSIS — I5022 Chronic systolic (congestive) heart failure: Secondary | ICD-10-CM | POA: Diagnosis not present

## 2023-04-23 DIAGNOSIS — K922 Gastrointestinal hemorrhage, unspecified: Secondary | ICD-10-CM | POA: Diagnosis not present

## 2023-04-23 DIAGNOSIS — D62 Acute posthemorrhagic anemia: Secondary | ICD-10-CM | POA: Diagnosis not present

## 2023-04-23 DIAGNOSIS — K921 Melena: Secondary | ICD-10-CM | POA: Diagnosis not present

## 2023-04-23 DIAGNOSIS — Z8673 Personal history of transient ischemic attack (TIA), and cerebral infarction without residual deficits: Secondary | ICD-10-CM | POA: Diagnosis not present

## 2023-04-23 LAB — HEMOGLOBIN AND HEMATOCRIT, BLOOD
HCT: 25.4 % — ABNORMAL LOW (ref 39.0–52.0)
Hemoglobin: 8.6 g/dL — ABNORMAL LOW (ref 13.0–17.0)

## 2023-04-23 LAB — GLUCOSE, CAPILLARY
Glucose-Capillary: 117 mg/dL — ABNORMAL HIGH (ref 70–99)
Glucose-Capillary: 228 mg/dL — ABNORMAL HIGH (ref 70–99)
Glucose-Capillary: 214 mg/dL — ABNORMAL HIGH (ref 70–99)
Glucose-Capillary: 152 mg/dL — ABNORMAL HIGH (ref 70–99)

## 2023-04-23 MED ORDER — PANTOPRAZOLE SODIUM 40 MG PO TBEC
40.0000 mg | DELAYED_RELEASE_TABLET | Freq: Two times a day (BID) | ORAL | Status: DC
  Administered 2023-04-23 – 2023-04-24 (×2): 40 mg via ORAL
  Filled 2023-04-23 (×2): qty 1

## 2023-04-23 NOTE — Progress Notes (Signed)
Notified primary rn

## 2023-04-23 NOTE — Progress Notes (Addendum)
PROGRESS NOTE    Bill Jackson  ZOX:[id number] DOB: Jun 16, 1935 DOA: 04/20/2023 PCP: Margit Hanks, MD (Inactive)   Brief Narrative:  88 y.o. male with hx of history hemorrhagic CVA with evacuation, residual right-sided hemiparesis and dysarthria, heart failure with reduced ejection fraction, A-fib, diabetes, hypertension presented with rectal bleeding and lightheadedness.  On presentation, hemoglobin was 10.5.  CT of abdomen and pelvis showed questionable focal thickening along the left proximal rectum, but no diverticulosis.  GI was consulted.  Assessment & Plan:   Probable lower GI bleeding presenting with rectal bleeding Acute blood loss anemia -Presented with rectal bleeding and hemoglobin of 10.5.  Received 1 unit packed red cell transfusion on admission.  Hemoglobin 8.6 today. -GI following: Status post sigmoidoscopy on 04/22/2023 which showed blood in the rectum and the sigmoid colon.   -Continue to monitor H&H.  Diet advancement as per GI.  Also on IV Protonix.  CAD with history of PCI -Stable.  Outpatient follow-up with cardiology  History of hemorrhagic stroke with evacuation with chronic right-sided hemiparesis and dysarthria -Currently stable.  Outpatient follow-up with neurology  Paroxysmal A-fib -Currently mostly rate controlled.  Continue amiodarone.  Outpatient follow-up with cardiology  Chronic systolic heart failure Hypertension -Echo in 12/2022 showed EF of 30%.  Coreg on hold.  Not on diuretics at home.  Strict input and output.  Daily weights.  Outpatient follow-up with cardiology.  CKD stage IIIa -Creatinine currently stable.  Monitor intermittently.  Diabetes mellitus type 2 with hypoglycemia and hyperglycemia -Long-acting insulin on hold.  Continue CBGs with SSI.  Hypomagnesemia -Improved   DVT prophylaxis: SCDs Code Status: DNR Family Communication: None at bedside Disposition Plan: Status is: Inpatient Remains inpatient appropriate because: Of  severity of illness    Consultants: GI  Procedures: As above  Antimicrobials: None   Subjective: Patient seen and examined at bedside.  No seizures, agitation, fever or vomiting reported. Objective: Vitals:   04/22/23 1441 04/22/23 1623 04/22/23 1907 04/23/23 0335  BP: 118/63 128/61 (!) 120/53 (!) 106/57  Pulse: 64 61 62 64  Resp: 18 18 18 18   Temp:  97.8 F (36.6 C) 98 F (36.7 C) 98.2 F (36.8 C)  TempSrc:  Oral Oral Oral  SpO2: 99% 100% 99% 100%  Weight:    78.6 kg  Height:        Intake/Output Summary (Last 24 hours) at 04/23/2023 0718 Last data filed at 04/22/2023 1217 Gross per 24 hour  Intake 236 ml  Output 850 ml  Net -614 ml   Filed Weights   04/21/23 1502 04/22/23 0421 04/23/23 0335  Weight: 76 kg 77.5 kg 78.6 kg    Examination:  General: Currently on room air.  No distress.  Looks chronically ill and deconditioned respiratory: Decreased breath sounds at bases bilaterally with some crackles CVS: Currently rate controlled; S1-S2 heard  abdominal: Soft, nontender, slightly distended, no organomegaly; bowel sounds are heard  extremities: Mild lower extremity edema; no clubbing.      Data Reviewed: I have personally reviewed following labs and imaging studies  CBC: Recent Labs  Lab 04/20/23 1936 04/21/23 0303 04/21/23 1131 04/21/23 1927 04/22/23 1126 04/22/23 1129 04/22/23 2154 04/23/23 0457  WBC 5.1 5.9  --   --  4.4  --   --   --   NEUTROABS 2.2  --   --   --  2.2  --   --   --   HGB 10.5* 9.8*   < > 9.4* 8.9* 8.8* 8.5*  8.6*  HCT 32.3* 29.5*   < > 28.5* 26.5* 26.0* 25.2* 25.4*  MCV 89.0 89.4  --   --  85.8  --   --   --   PLT 291 254  --   --  229  --   --   --    < > = values in this interval not displayed.   Basic Metabolic Panel: Recent Labs  Lab 04/20/23 1936 04/21/23 0303 04/22/23 1126  NA 139 138 140  K 3.7 4.1 3.8  CL 103 106 106  CO2 27 27 27   GLUCOSE 223* 185* 203*  BUN 20 22 16   CREATININE 1.31* 1.27* 1.26*  CALCIUM  8.0* 7.7* 8.0*  MG  --  1.6* 1.7  PHOS  --  4.4  --    GFR: Estimated Creatinine Clearance: 46.2 mL/min (A) (by C-G formula based on SCr of 1.26 mg/dL (H)). Liver Function Tests: Recent Labs  Lab 04/20/23 1936  AST 17  ALT 12  ALKPHOS 45  BILITOT 0.6  PROT 5.4*  ALBUMIN 2.5*   Recent Labs  Lab 04/20/23 1936  LIPASE 53*   No results for input(s): "AMMONIA" in the last 168 hours. Coagulation Profile: No results for input(s): "INR", "PROTIME" in the last 168 hours. Cardiac Enzymes: No results for input(s): "CKTOTAL", "CKMB", "CKMBINDEX", "TROPONINI" in the last 168 hours. BNP (last 3 results) No results for input(s): "PROBNP" in the last 8760 hours. HbA1C: No results for input(s): "HGBA1C" in the last 72 hours. CBG: Recent Labs  Lab 04/21/23 1641 04/21/23 1935 04/22/23 0732 04/22/23 1206 04/22/23 1856  GLUCAP 179* 191* 141* 190* 152*   Lipid Profile: No results for input(s): "CHOL", "HDL", "LDLCALC", "TRIG", "CHOLHDL", "LDLDIRECT" in the last 72 hours. Thyroid Function Tests: No results for input(s): "TSH", "T4TOTAL", "FREET4", "T3FREE", "THYROIDAB" in the last 72 hours. Anemia Panel: No results for input(s): "VITAMINB12", "FOLATE", "FERRITIN", "TIBC", "IRON", "RETICCTPCT" in the last 72 hours. Sepsis Labs: Recent Labs  Lab 04/20/23 1942  LATICACIDVEN 1.6    Recent Results (from the past 240 hour(s))  MRSA Next Gen by PCR, Nasal     Status: None   Collection Time: 04/21/23  3:18 PM   Specimen: Nasal Mucosa; Nasal Swab  Result Value Ref Range Status   MRSA by PCR Next Gen NOT DETECTED NOT DETECTED Final    Comment: (NOTE) The GeneXpert MRSA Assay (FDA approved for NASAL specimens only), is one component of a comprehensive MRSA colonization surveillance program. It is not intended to diagnose MRSA infection nor to guide or monitor treatment for MRSA infections. Test performance is not FDA approved in patients less than 35 years old. Performed at [facility], [address] [place], [place] [address]          Radiology Studies: No results found.      Scheduled Meds:  amiodarone  200 mg Oral Daily   influenza vaccine adjuvanted  0.5 mL Intramuscular Tomorrow-1000   insulin aspart  0-6 Units Subcutaneous TID WC   pantoprazole (PROTONIX) IV  40 mg Intravenous Q12H   sodium phosphate  1 enema Rectal Once   Continuous Infusions:  lactated ringers 75 mL/hr at 04/23/23 0981          Glade Lloyd, MD [facility] 04/23/2023, 7:18 AM

## 2023-04-23 NOTE — Plan of Care (Signed)
Problem: Education: Goal: Ability to describe self-care measures that may prevent or decrease complications (Diabetes Survival Skills Education) will improve Outcome: Progressing   Problem: Coping: Goal: Ability to adjust to condition or change in health will improve Outcome: Progressing   Problem: Health Behavior/Discharge Planning: Goal: Ability to identify and utilize available resources and services will improve Outcome: Progressing   Problem: Skin Integrity: Goal: Risk for impaired skin integrity will decrease Outcome: Progressing

## 2023-04-23 NOTE — [place] FL2 (Signed)
[place] MEDICAID FL2 LEVEL OF CARE FORM     IDENTIFICATION  Patient Name: Bill Jackson Birthdate: [birth year] Sex: male Admission Date (Current Location): 04/20/2023  [facility] and [place] Number:  Producer, television/film/video and Address:  [facility] [place], [address], [place], [place] [address]      Provider Number: [id number]  Attending Physician Name and Address:  Glade Lloyd, MD  Relative Name and Phone Number:  Michiel Cowboy (Other)  [phone]    Current Level of Care: Hospital Recommended Level of Care: Skilled Nursing Facility Prior Approval Number:    Date Approved/Denied:   PASRR Number: [id number]  Discharge Plan: SNF    Current Diagnoses: Patient Active Problem List   Diagnosis Date Noted   Hematochezia 04/22/2023   Acute blood loss anemia 04/22/2023   Imaging of gastrointestinal tract abnormal 04/22/2023   GI bleed 04/21/2023   Hypomagnesemia 12/16/2022   Hypernatremia 12/16/2022   Acute metabolic encephalopathy 12/16/2022   Rhabdomyolysis 12/16/2022   Sinus bradycardia 12/16/2022   NSVT (nonsustained ventricular tachycardia) (HCC) 12/16/2022   Acute combined systolic and diastolic congestive heart failure (HCC) 12/16/2022   NSTEMI (non-ST elevated myocardial infarction) (HCC) 12/16/2022   Syncope and collapse 12/16/2022   Rib fractures 12/16/2022   PAF (paroxysmal atrial fibrillation) (HCC) 12/11/2022   HFrEF (heart failure with reduced ejection fraction) (HCC) 12/10/2022   Diabetic neuropathy (HCC) 12/09/2022   Esophageal reflux 12/09/2022   Fall 12/09/2022   Elevated troponin 12/09/2022   Cerebellar Hemorrhagic stroke (HCC) 10/02/2022   Cellulitis of buttock 10/30/2020   Left shoulder pain 07/11/2013   Protein-calorie malnutrition, severe (HCC) 06/12/2013   Syncope 06/11/2013   Right shoulder pain 11/17/2012   Right sided weakness 11/17/2012   Low back pain 11/15/2012   Glaucoma 11/03/2012   Headache(784.0) 10/29/2012    Cough 10/29/2012   OA (osteoarthritis) of knee-right 10/28/2012   Accelerated hypertension 10/28/2012   Hypokalemia 10/28/2012   Acute cerebellar hemorrhage (HCC) 10/28/2012   Encounter for long-term (current) use of medications 10/28/2012   Type 2 diabetes mellitus (HCC) 10/25/2012   Hypertension 10/25/2012    Orientation RESPIRATION BLADDER Height & Weight        Normal Continent Weight: 173 lb 4.5 oz (78.6 kg) Height:  6' (182.9 cm)  BEHAVIORAL SYMPTOMS/MOOD NEUROLOGICAL BOWEL NUTRITION STATUS      Continent Diet (see discharge summary)  AMBULATORY STATUS COMMUNICATION OF NEEDS Skin   Extensive Assist Verbally Normal                       Personal Care Assistance Level of Assistance  Bathing, Feeding, Dressing Bathing Assistance: Maximum assistance Feeding assistance: Limited assistance Dressing Assistance: Maximum assistance     Functional Limitations Info  Sight, Hearing, Speech Sight Info: Impaired Hearing Info: Adequate Speech Info: Adequate    SPECIAL CARE FACTORS FREQUENCY  PT (By licensed PT), OT (By licensed OT)     PT Frequency: 5x/week OT Frequency: 5x/week            Contractures Contractures Info: Not present    Additional Factors Info  Code Status Code Status Info: DNR-Limited             Current Medications (04/23/2023):  This is the current hospital active medication list Current Facility-Administered Medications  Medication Dose Route Frequency Provider Last Rate Last Admin   amiodarone (PACERONE) tablet 200 mg  200 mg Oral Daily Dolly Rias, MD   200 mg at 04/23/23 [phone]  influenza vaccine adjuvanted (FLUAD) injection 0.5 mL  0.5 mL Intramuscular Tomorrow-1000 Alekh, Kshitiz, MD       insulin aspart (novoLOG) injection 0-6 Units  0-6 Units Subcutaneous TID WC Dolly Rias, MD   1 Units at 04/22/23 1904   lactated ringers infusion   Intravenous Continuous Laurence Spates, MD 75 mL/hr at 04/23/23 2956 New Bag at  04/23/23 0611   pantoprazole (PROTONIX) injection 40 mg  40 mg Intravenous Domenica Fail, MD   40 mg at 04/23/23 2130   sodium phosphate (FLEET) enema 1 enema  1 enema Rectal Once Jenel Lucks, MD         Discharge Medications: Please see discharge summary for a list of discharge medications.  Relevant Imaging Results:  Relevant Lab Results:   Additional Information SSN: [id number]  Taiwana Willison A [place], [place]

## 2023-04-23 NOTE — Progress Notes (Signed)
[facility] ROUNDING NOTE   Subjective: Patient denies any bowel movements overnight.  He is feeling well.  No complaints.  Hemoglobin stable from yesterday.   Objective: Vital signs in last 24 hours: Temp:  [97.5 F (36.4 C)-98.2 F (36.8 C)] 98.2 F (36.8 C) (09/20 0335) Pulse Rate:  [61-70] 70 (09/20 0721) Resp:  [18] 18 (09/20 0335) BP: (94-148)/(51-70) 148/70 (09/20 0721) SpO2:  [98 %-100 %] 100 % (09/20 0721) Weight:  [78.6 kg] 78.6 kg (09/20 0335) Last BM Date : 04/21/23 General: NAD, pleasant African-American male Lungs:  CTA b/l, no w/r/r Heart:  RRR, no m/r/g Abdomen:  Soft, NT, ND, +BS    Intake/Output from previous day: 09/19 0701 - 09/20 0700 In: 236 [P.O.:236] Out: 850 [Urine:850] Intake/Output this shift: Total I/O In: -  Out: 200 [Urine:200]   Lab Results: Recent Labs    04/20/23 1936 04/21/23 0303 04/21/23 1131 04/22/23 1126 04/22/23 1129 04/22/23 2154 04/23/23 0457  WBC 5.1 5.9  --  4.4  --   --   --   HGB 10.5* 9.8*   < > 8.9* 8.8* 8.5* 8.6*  PLT 291 254  --  229  --   --   --   MCV 89.0 89.4  --  85.8  --   --   --    < > = values in this interval not displayed.   BMET Recent Labs    04/20/23 1936 04/21/23 0303 04/22/23 1126  NA 139 138 140  K 3.7 4.1 3.8  CL 103 106 106  CO2 27 27 27   GLUCOSE 223* 185* 203*  BUN 20 22 16   CREATININE 1.31* 1.27* 1.26*  CALCIUM 8.0* 7.7* 8.0*   LFT Recent Labs    04/20/23 1936  PROT 5.4*  ALBUMIN 2.5*  AST 17  ALT 12  ALKPHOS 45  BILITOT 0.6   PT/INR No results for input(s): "INR" in the last 72 hours.    Imaging/Other results: No results found.    Assessment and Plan:  88 year old male with history of hemorrhagic stroke, atrial fibrillation and chronic systolic heart failure, residing in nursing home admitted to the hospital after 2 days of recurrent bloody bowel movements.   His hemoglobin was down from his baseline but has remained stable since 9/18.    The  CT showed questionable rectal thickening.  Unsedated flex sig yesterday showed unremarkable rectum without any mass lesion or mucosal abnormality.  There was old blood in the colon, but no active bleeding. His hemoglobin has been stable for 48 hours.  Based on the stool seen in the colon during his flexible sigmoidoscopy, would expect his next stool to appear dark, consistent with the passage of old blood  Hematochezia/lower GI bleed - Presentation most consistent with a diverticular bleed.  Colonoscopy not pursued due to patient's advanced age/comorbidities and likelihood of spontaneous resolution of bleeding - Bleeding appears to have resolved as of the moment. - Okay to advance to regular diet.  Discharge today not unreasonable.  GI will sign off at this time.   Jenel Lucks, MD  04/23/2023, 9:04 AM [facility]

## 2023-04-23 NOTE — Progress Notes (Signed)
Added to wrong column Bill Jackson, nursing student

## 2023-04-23 NOTE — TOC Initial Note (Signed)
Transition of Care [facility]) - Initial/Assessment Note    [name] MRN: [id number] Date of Birth: 05-Dec-1934  Transition of Care [facility]) CM/SW Contact:    Markevius Trombetta A [place], Theresia Majors Phone Number: 04/23/2023, 11:49 AM  Clinical Narrative:                  CSW met with pt at bedside. He said that he came from [facility] and was ok with going back. He requested CSW follow up with pt's daughter and speak with her as well.   CSW then contacted [facility], pt able to return to facility over the weekend. Needs reauthorization.   TOC will continue to follow.   Expected Discharge Plan: Skilled Nursing Facility Barriers to Discharge: Continued Medical Work up, English as a second language teacher   Patient Goals and CMS Choice           Expected Discharge Plan and Services In-house Referral: Clinical Social Work     Living arrangements for the past 2 months: Skilled Nursing Facility                                      Prior Living Arrangements/Services Living arrangements for the past 2 months: Skilled Nursing Facility Lives with:: Facility Resident          Need for Family Participation in Patient Care: Yes (Comment)        Activities of Daily Living Home Assistive Devices/Equipment: Environmental consultant (specify type), Wheelchair ADL Screening (condition at time of admission) Patient's cognitive ability adequate to safely complete daily activities?: Yes Is the patient deaf or have difficulty hearing?: Yes Does the patient have difficulty seeing, even when wearing glasses/contacts?: Yes Does the patient have difficulty concentrating, remembering, or making decisions?: Yes Patient able to express need for assistance with ADLs?: Yes Does the patient have difficulty dressing or bathing?: Yes Independently performs ADLs?: No Communication: Independent Dressing (OT): Dependent Grooming: Dependent Feeding: Needs assistance Bathing: Dependent Toileting: Dependent In/Out  Bed: Dependent Is this a change from baseline?: Pre-admission baseline Walks in Home: Dependent Is this a change from baseline?: Pre-admission baseline Does the patient have difficulty walking or climbing stairs?: Yes Weakness of Legs: Right Weakness of Arms/Hands: Right  Permission Sought/Granted                  Emotional Assessment Appearance:: Appears stated age Attitude/Demeanor/Rapport: Lethargic Affect (typically observed): Appropriate Orientation: : Oriented to Self, Oriented to Place, Oriented to Situation Alcohol / Substance Use: Tobacco Use Psych Involvement: No (comment)  Admission diagnosis:  GI bleed [K92.2] Acute GI bleeding [K92.2] Patient Active Problem List   Diagnosis Date Noted   Hematochezia 04/22/2023   Acute blood loss anemia 04/22/2023   Imaging of gastrointestinal tract abnormal 04/22/2023   GI bleed 04/21/2023   Hypomagnesemia 12/16/2022   Hypernatremia 12/16/2022   Acute metabolic encephalopathy 12/16/2022   Rhabdomyolysis 12/16/2022   Sinus bradycardia 12/16/2022   NSVT (nonsustained ventricular tachycardia) (HCC) 12/16/2022   Acute combined systolic and diastolic congestive heart failure (HCC) 12/16/2022   NSTEMI (non-ST elevated myocardial infarction) (HCC) 12/16/2022   Syncope and collapse 12/16/2022   Rib fractures 12/16/2022   PAF (paroxysmal atrial fibrillation) (HCC) 12/11/2022   HFrEF (heart failure with reduced ejection fraction) (HCC) 12/10/2022   Diabetic neuropathy (HCC) 12/09/2022   Esophageal reflux 12/09/2022   Fall 12/09/2022   Elevated troponin 12/09/2022   Cerebellar Hemorrhagic stroke (HCC)  10/02/2022   Cellulitis of buttock 10/30/2020   Left shoulder pain 07/11/2013   Protein-calorie malnutrition, severe (HCC) 06/12/2013   Syncope 06/11/2013   Right shoulder pain 11/17/2012   Right sided weakness 11/17/2012   Low back pain 11/15/2012   Glaucoma 11/03/2012   Headache(784.0) 10/29/2012   Cough 10/29/2012   OA  (osteoarthritis) of knee-right 10/28/2012   Accelerated hypertension 10/28/2012   Hypokalemia 10/28/2012   Acute cerebellar hemorrhage (HCC) 10/28/2012   Encounter for long-term (current) use of medications 10/28/2012   Type 2 diabetes mellitus (HCC) 10/25/2012   Hypertension 10/25/2012   PCP:  Margit Hanks, MD (Inactive) Pharmacy:   [facility] [phone] - [place] Otto, [place] - [address] AT [facility] [address] [facility] [address] [place] [address] Phone: [phone] Fax: [phone]  [facility] - [place], [place] - [address] [place] [address] [place] [address] [place] [address] Phone: [phone] Fax: [phone]     Social Determinants of Health (SDOH) Social History: SDOH Screenings   Food Insecurity: No Food Insecurity (04/21/2023)  Housing: Low Risk  (04/21/2023)  Transportation Needs: No Transportation Needs (04/21/2023)  Utilities: Not At Risk (04/21/2023)  Tobacco Use: High Risk (04/21/2023)   SDOH Interventions:     Readmission Risk Interventions     No data to display

## 2023-04-24 DIAGNOSIS — K922 Gastrointestinal hemorrhage, unspecified: Secondary | ICD-10-CM | POA: Diagnosis not present

## 2023-04-24 LAB — CBC WITH DIFFERENTIAL/PLATELET
Abs Immature Granulocytes: 0.02 10*3/uL (ref 0.00–0.07)
Basophils Absolute: 0 10*3/uL (ref 0.0–0.1)
Basophils Relative: 0 %
Eosinophils Absolute: 0.2 10*3/uL (ref 0.0–0.5)
Eosinophils Relative: 3 %
HCT: 26.3 % — ABNORMAL LOW (ref 39.0–52.0)
Hemoglobin: 8.5 g/dL — ABNORMAL LOW (ref 13.0–17.0)
Immature Granulocytes: 0 %
Lymphocytes Relative: 37 %
Lymphs Abs: 1.8 10*3/uL (ref 0.7–4.0)
MCH: 28.2 pg (ref 26.0–34.0)
MCHC: 32.3 g/dL (ref 30.0–36.0)
MCV: 87.4 fL (ref 80.0–100.0)
Monocytes Absolute: 0.7 10*3/uL (ref 0.1–1.0)
Monocytes Relative: 14 %
Neutro Abs: 2.2 10*3/uL (ref 1.7–7.7)
Neutrophils Relative %: 46 %
Platelets: 250 10*3/uL (ref 150–400)
RBC: 3.01 MIL/uL — ABNORMAL LOW (ref 4.22–5.81)
RDW: 13.7 % (ref 11.5–15.5)
WBC: 4.9 10*3/uL (ref 4.0–10.5)
nRBC: 0 % (ref 0.0–0.2)

## 2023-04-24 LAB — BASIC METABOLIC PANEL
Anion gap: 8 (ref 5–15)
BUN: 11 mg/dL (ref 8–23)
CO2: 25 mmol/L (ref 22–32)
Calcium: 8.1 mg/dL — ABNORMAL LOW (ref 8.9–10.3)
Chloride: 106 mmol/L (ref 98–111)
Creatinine, Ser: 1.44 mg/dL — ABNORMAL HIGH (ref 0.61–1.24)
GFR, Estimated: 47 mL/min — ABNORMAL LOW (ref >60)
Glucose, Bld: 173 mg/dL — ABNORMAL HIGH (ref 70–99)
Potassium: 3.6 mmol/L (ref 3.5–5.1)
Sodium: 139 mmol/L (ref 135–145)

## 2023-04-24 LAB — GLUCOSE, CAPILLARY
Glucose-Capillary: 160 mg/dL — ABNORMAL HIGH (ref 70–99)
Glucose-Capillary: 186 mg/dL — ABNORMAL HIGH (ref 70–99)

## 2023-04-24 LAB — MAGNESIUM: Magnesium: 1.6 mg/dL — ABNORMAL LOW (ref 1.7–2.4)

## 2023-04-24 MED ORDER — MAGNESIUM SULFATE 2 GM/50ML IV SOLN
2.0000 g | Freq: Once | INTRAVENOUS | Status: AC
  Administered 2023-04-24: 2 g via INTRAVENOUS
  Filled 2023-04-24: qty 50

## 2023-04-24 NOTE — Plan of Care (Signed)
Problem: Coping: Goal: Ability to adjust to condition or change in health will improve Outcome: Progressing   Problem: Nutritional: Goal: Maintenance of adequate nutrition will improve Outcome: Progressing   Problem: Skin Integrity: Goal: Risk for impaired skin integrity will decrease Outcome: Progressing

## 2023-04-24 NOTE — TOC Transition Note (Signed)
Transition of Care [facility]) - CM/SW Discharge Note   [name] MRN: [id number] Date of Birth: Nov 19, 1934  [facility]) CM/SW Contact:  Bill Jackson, [place] Phone Number: 04/24/2023, 11:40 AM   Clinical Narrative:  pt for dc back to [place] where he is a LTC resident. Spoke to Stallings in admissions who confirmed pt able to return to room 904. Pt's goddaughter Bill Jackson aware of dc and reports agreeable. RN provided with number for report and PTAR arranged for transport. SW signing off at dc.   Bill Jackson, MSW, LCSW [phone] (coverage)       Final next level of care: Skilled Nursing Facility Barriers to Discharge: No Barriers Identified   Patient Goals and CMS Choice      Discharge Placement                Patient chooses bed at: [facility] Patient to be transferred to facility by: PTAR Name of family member notified: Bill Jackson Patient and family notified of of transfer: 04/24/23  Discharge Plan and Services Additional resources added to the After Visit Summary for   In-house Referral: Clinical Social Work                                   Social Determinants of Health (SDOH) Interventions SDOH Screenings   Food Insecurity: No Food Insecurity (04/21/2023)  Housing: Low Risk  (04/21/2023)  Transportation Needs: No Transportation Needs (04/21/2023)  Utilities: Not At Risk (04/21/2023)  Tobacco Use: High Risk (04/21/2023)     Readmission Risk Interventions     No data to display

## 2023-04-24 NOTE — Progress Notes (Addendum)
Report called to [facility] RN @ [facility] [phone], pt coming to room 904.   RN administered flu vaccine in L Deltoid, see MAR. IVs removed w/ catheter intact. Tele monitor removed. Pt belongings packed and ready for transport. Pt family updated.

## 2023-04-24 NOTE — Discharge Summary (Signed)
Physician Discharge Summary  Bill Jackson WFU:[id number] DOB: 1935/03/09 DOA: 04/20/2023  PCP: Margit Hanks, MD (Inactive)  Admit date: 04/20/2023 Discharge date: 04/24/2023  Admitted From: SNF Disposition: SNF  Recommendations for Outpatient Follow-up:  Follow up with SNF provider at earliest convenience with repeat CBC/BMP in the next few days Outpatient follow-up with GI Follow up in ED if symptoms worsen or new appear   Home Health: No Equipment/Devices: None  Discharge Condition: Stable CODE STATUS: DNR Diet recommendation: Heart healthy/carb modified  Brief/Interim Summary: 88 y.o. male with hx of history hemorrhagic CVA with evacuation, residual right-sided hemiparesis and dysarthria, heart failure with reduced ejection fraction, A-fib, diabetes, hypertension presented with rectal bleeding and lightheadedness.  On presentation, hemoglobin was 10.5.  CT of abdomen and pelvis showed questionable focal thickening along the left proximal rectum, but no diverticulosis.  GI was consulted. During the hospitalization, patient underwent sigmoidoscopy on 04/22/2023 which showed blood in the rectum and the sigmoid colon.  Subsequently H&H has remained stable.  Patient is tolerating advanced diet.  GI has cleared the patient for discharge and has signed off.  Discharge patient back to SNF today.  Discharge Diagnoses:   Probable lower GI bleeding presenting with rectal bleeding Acute blood loss anemia -Presented with rectal bleeding and hemoglobin of 10.5.  Received 1 unit packed red cell transfusion on admission.  Hemoglobin 8.5 today. -Status post sigmoidoscopy on 04/22/2023 which showed blood in the rectum and the sigmoid colon.   -Subsequently H&H has remained stable.  Patient is tolerating advanced diet.  GI has cleared the patient for discharge and has signed off.  Discharge patient back to SNF today.  Outpatient follow-up with GI.  Follow CBC in the next few days.   CAD with  history of PCI -Stable.  Outpatient follow-up with cardiology   History of hemorrhagic stroke with evacuation with chronic right-sided hemiparesis and dysarthria -Currently stable.  Outpatient follow-up with neurology   Paroxysmal A-fib -Currently mostly rate controlled.  Continue amiodarone.  Outpatient follow-up with cardiology.  Coreg on hold for now.   Chronic systolic heart failure Hypertension -Echo in 12/2022 showed EF of 30%.  Coreg on hold because of blood pressure being on the lower side.  This can be resumed as an outpatient if blood pressure improves.  Not on diuretics at home.  Strict input and output.  Daily weights.  Outpatient follow-up with cardiology.   CKD stage IIIa -Creatinine currently stable.  Monitor intermittently as an outpatient.   Diabetes mellitus type 2 with hypoglycemia and hyperglycemia -Carb modified diet.  Continue outpatient regimen.   Hypomagnesemia -Replace prior to discharge. Discharge Instructions  Discharge Instructions     Ambulatory referral to Gastroenterology   Complete by: As directed    What is the reason for referral?: Other   Diet - low sodium heart healthy   Complete by: As directed    Increase activity slowly   Complete by: As directed       Allergies as of 04/24/2023       Reactions   Colestipol Other (See Comments)   Unknown reaction per [facility]   Ezetimibe Other (See Comments)   Unknown reaction per [facility]   Hydrocodone-acetaminophen Other (See Comments)   Unknown reaction per [facility]   Lisinopril Cough   Lotensin [benazepril Hcl] Cough   Not listed under allergies/reactions per [facility]   Lovastatin Other (See Comments)   Unknown reaction per [facility] **Mevacor**   Penicillins Other (See Comments)   pt becomes "nervous"  Has patient had a PCN reaction causing immediate rash, facial/tongue/throat swelling, SOB or lightheadedness with hypotension:  Has patient had a PCN reaction causing severe rash involving mucus membranes or skin  necrosis:  Has patient had a PCN reaction that required hospitalization  Has patient had a PCN reaction occurring within the last 10 years:  If all of the above answers are "NO", then may proceed with Cephalosporin use.   Simvastatin Other (See Comments)   Unknown reaction per [facility]        Medication List     STOP taking these medications    aspirin 81 MG chewable tablet   carvedilol 6.25 MG tablet Commonly known as: COREG       TAKE these medications    acetaminophen 325 MG tablet Commonly known as: TYLENOL Take 650 mg by mouth every 6 (six) hours as needed for mild pain.   amiodarone 200 MG tablet Commonly known as: PACERONE Take 1 tablet (200 mg total) by mouth daily.   ammonium lactate 12 % lotion Commonly known as: LAC-HYDRIN Apply 1 Application topically 2 (two) times daily as needed for dry skin.   Boost Glucose Control Liqd Take 237 mLs by mouth with breakfast, with lunch, and with evening meal.   brimonidine 0.2 % ophthalmic solution Commonly known as: ALPHAGAN Place 1 drop into both eyes every 8 (eight) hours.   dorzolamide-timolol 2-0.5 % ophthalmic solution Commonly known as: COSOPT Place 1 drop into both eyes 2 (two) times daily.   insulin glargine 100 UNIT/ML injection Commonly known as: LANTUS Inject 10-20 Units into the skin See admin instructions. If bs is  175 or below =10 units, if bs is 175 = 15 units. If bs is 200= 20 units   latanoprost 0.005 % ophthalmic solution Commonly known as: XALATAN Place 1 drop into both eyes at bedtime.   loratadine 10 MG tablet Commonly known as: CLARITIN Take 10 mg by mouth daily.   multivitamin with minerals Tabs tablet Take 1 tablet by mouth daily.   nicotine polacrilex 2 MG lozenge Commonly known as: COMMIT Take 2 mg by mouth as needed for smoking cessation.   polyvinyl alcohol 1.4 % ophthalmic solution Commonly known as: LIQUIFILM TEARS Place 1 drop into both eyes 4 (four) times daily as needed  for dry eyes.   triamcinolone cream 0.1 % Commonly known as: KENALOG Apply 1 Application topically 2 (two) times daily as needed (scalp lesion).        Allergies  Allergen Reactions   Colestipol Other (See Comments)    Unknown reaction per [facility]   Ezetimibe Other (See Comments)    Unknown reaction per [facility]    Hydrocodone-Acetaminophen Other (See Comments)    Unknown reaction per [facility]    Lisinopril Cough   Lotensin [Benazepril Hcl] Cough    Not listed under allergies/reactions per [facility]   Lovastatin Other (See Comments)    Unknown reaction per [facility]  **Mevacor**   Penicillins Other (See Comments)    pt becomes "nervous" Has patient had a PCN reaction causing immediate rash, facial/tongue/throat swelling, SOB or lightheadedness with hypotension:  Has patient had a PCN reaction causing severe rash involving mucus membranes or skin necrosis:  Has patient had a PCN reaction that required hospitalization  Has patient had a PCN reaction occurring within the last 10 years:  If all of the above answers are "NO", then may proceed with Cephalosporin use.    Simvastatin Other (See Comments)    Unknown reaction per [facility]  Consultations: GI   Procedures/Studies: CT ABDOMEN PELVIS W CONTRAST  Result Date: 04/20/2023 CLINICAL DATA:  Abdominal pain EXAM: CT ABDOMEN AND PELVIS WITH CONTRAST TECHNIQUE: Multidetector CT imaging of the abdomen and pelvis was performed using the standard protocol following bolus administration of intravenous contrast. RADIATION DOSE REDUCTION: This exam was performed according to the departmental dose-optimization program which includes automated exposure control, adjustment of the mA and/or kV according to patient size and/or use of iterative reconstruction technique. CONTRAST:  75mL OMNIPAQUE IOHEXOL 350 MG/ML SOLN COMPARISON:  12/09/2022 FINDINGS: Lower chest: Lung bases are clear. Hepatobiliary: Scattered hepatic cysts measuring up to 5.3 cm (series  3/image 13), benign. Gallbladder is unremarkable. No intrahepatic or extrahepatic ductal dilatation. Pancreas: Within normal limits. Spleen: Within normal limits. Adrenals/Urinary Tract: Adrenal glands are within normal limits. Kidneys are within normal limits.  No hydronephrosis. Bladder is underdistended with a small right posterolateral bladder diverticulum (series 3/image 69). Stomach/Bowel: Stomach is within normal limits. No evidence of bowel obstruction. Normal appendix (series 3/image 43). Mild focal wall thickening with enhancement along the left lateral rectum (series 3/image 68), equivocal. Vascular/Lymphatic: No evidence of abdominal aortic aneurysm. Atherosclerotic calcifications of the abdominal aorta and branch vessels, although vessels remain patent. No suspicious abdominopelvic lymphadenopathy. Reproductive: Prostate is unremarkable. Other: No abdominopelvic ascites.  Trace presacral stranding. Mild fat in the left inguinal canal. Musculoskeletal: Mild degenerative changes at L3-4. IMPRESSION: No CT findings to account for the patient's abdominal pain. Mild focal wall thickening with enhancement along the left lateral rectum, equivocal. Consider colonoscopy as clinically warranted to assess for early colonic mass. Additional ancillary findings as above. Electronically Signed   By: Charline Bills M.D.   On: 04/20/2023 21:39      Subjective: Patient seen and examined at bedside.  No fever, agitation, seizures or vomiting reported.  Discharge Exam: Vitals:   04/24/23 0416 04/24/23 0715  BP: 124/63 130/63  Pulse: (!) 58 62  Resp: 17 16  Temp: 97.8 F (36.6 C) 98.9 F (37.2 C)  SpO2: 100% 98%    General: Elderly male lying in bed.  Chronically ill and deconditioned looking.  On room air.  No distress currently.  Slow to respond.  Poor historian.  Speech is not that comprehensible Cardiovascular: Mild intermittent bradycardia present, S1/S2 + Respiratory: bilateral decreased breath  sounds at bases Abdominal: Soft, NT, ND, bowel sounds + Extremities: Trace lower extremity edema; no cyanosis    The results of significant diagnostics from this hospitalization (including imaging, microbiology, ancillary and laboratory) are listed below for reference.     Microbiology: Recent Results (from the past 240 hour(s))  MRSA Next Gen by PCR, Nasal     Status: None   Collection Time: 04/21/23  3:18 PM   Specimen: Nasal Mucosa; Nasal Swab  Result Value Ref Range Status   MRSA by PCR Next Gen NOT DETECTED NOT DETECTED Final    Comment: (NOTE) The GeneXpert MRSA Assay (FDA approved for NASAL specimens only), is one component of a comprehensive MRSA colonization surveillance program. It is not intended to diagnose MRSA infection nor to guide or monitor treatment for MRSA infections. Test performance is not FDA approved in patients less than 49 years old. Performed at [facility], [address] [place], [place] [address]      Labs: BNP (last 3 results) No results for input(s): "BNP" in the last 8760 hours. Basic Metabolic Panel: Recent Labs  Lab 04/20/23 1936 04/21/23 0303 04/22/23 1126 04/24/23 0608  NA 139  138 140 139  K 3.7 4.1 3.8 3.6  CL 103 106 106 106  CO2 27 27 27 25   GLUCOSE 223* 185* 203* 173*  BUN 20 22 16 11   CREATININE 1.31* 1.27* 1.26* 1.44*  CALCIUM 8.0* 7.7* 8.0* 8.1*  MG  --  1.6* 1.7 1.6*  PHOS  --  4.4  --   --    Liver Function Tests: Recent Labs  Lab 04/20/23 1936  AST 17  ALT 12  ALKPHOS 45  BILITOT 0.6  PROT 5.4*  ALBUMIN 2.5*   Recent Labs  Lab 04/20/23 1936  LIPASE 53*   No results for input(s): "AMMONIA" in the last 168 hours. CBC: Recent Labs  Lab 04/20/23 1936 04/21/23 0303 04/21/23 1131 04/22/23 1126 04/22/23 1129 04/22/23 2154 04/23/23 0457 04/24/23 0608  WBC 5.1 5.9  --  4.4  --   --   --  4.9  NEUTROABS 2.2  --   --  2.2  --   --   --  2.2  HGB 10.5* 9.8*   < > 8.9* 8.8* 8.5* 8.6* 8.5*  HCT  32.3* 29.5*   < > 26.5* 26.0* 25.2* 25.4* 26.3*  MCV 89.0 89.4  --  85.8  --   --   --  87.4  PLT 291 254  --  229  --   --   --  250   < > = values in this interval not displayed.   Cardiac Enzymes: No results for input(s): "CKTOTAL", "CKMB", "CKMBINDEX", "TROPONINI" in the last 168 hours. BNP: Invalid input(s): "POCBNP" CBG: Recent Labs  Lab 04/23/23 0724 04/23/23 1153 04/23/23 1637 04/23/23 1923 04/24/23 0716  GLUCAP 117* 228* 214* 152* 160*   D-Dimer No results for input(s): "DDIMER" in the last 72 hours. Hgb A1c No results for input(s): "HGBA1C" in the last 72 hours. Lipid Profile No results for input(s): "CHOL", "HDL", "LDLCALC", "TRIG", "CHOLHDL", "LDLDIRECT" in the last 72 hours. Thyroid function studies No results for input(s): "TSH", "T4TOTAL", "T3FREE", "THYROIDAB" in the last 72 hours.  Invalid input(s): "FREET3" Anemia work up No results for input(s): "VITAMINB12", "FOLATE", "FERRITIN", "TIBC", "IRON", "RETICCTPCT" in the last 72 hours. Urinalysis    Component Value Date/Time   COLORURINE YELLOW 04/21/2023 0034   APPEARANCEUR CLEAR 04/21/2023 0034   LABSPEC >1.046 (H) 04/21/2023 0034   PHURINE 5.0 04/21/2023 0034   GLUCOSEU NEGATIVE 04/21/2023 0034   HGBUR NEGATIVE 04/21/2023 0034   BILIRUBINUR NEGATIVE 04/21/2023 0034   KETONESUR NEGATIVE 04/21/2023 0034   PROTEINUR NEGATIVE 04/21/2023 0034   UROBILINOGEN 1.0 10/21/2014 1059   NITRITE NEGATIVE 04/21/2023 0034   LEUKOCYTESUR NEGATIVE 04/21/2023 0034   Sepsis Labs Recent Labs  Lab 04/20/23 1936 04/21/23 0303 04/22/23 1126 04/24/23 0608  WBC 5.1 5.9 4.4 4.9   Microbiology Recent Results (from the past 240 hour(s))  MRSA Next Gen by PCR, Nasal     Status: None   Collection Time: 04/21/23  3:18 PM   Specimen: Nasal Mucosa; Nasal Swab  Result Value Ref Range Status   MRSA by PCR Next Gen NOT DETECTED NOT DETECTED Final    Comment: (NOTE) The GeneXpert MRSA Assay (FDA approved for NASAL specimens  only), is one component of a comprehensive MRSA colonization surveillance program. It is not intended to diagnose MRSA infection nor to guide or monitor treatment for MRSA infections. Test performance is not FDA approved in patients less than 52 years old. Performed at [facility], [address] [place], [place] [address]  Time coordinating discharge: 35 minutes  SIGNED:   Glade Lloyd, MD  [facility] 04/24/2023, 9:20 AM

## 2023-04-25 ENCOUNTER — Encounter (HOSPITAL_COMMUNITY): Payer: Self-pay | Admitting: Gastroenterology

## 2023-04-29 ENCOUNTER — Telehealth: Payer: Self-pay | Admitting: Gastroenterology

## 2023-04-29 NOTE — Telephone Encounter (Signed)
Inbound call from Diane from [facility] center requesting an appointment for this patient. State he was just in the hospital for an upper GI Bleed, Dr. Tomasa Rand preformed procedure and they are requesting a follow up. No appointment available until Jan, requesting something sooner.  [phone], good contact phone number

## 2023-04-29 NOTE — Telephone Encounter (Signed)
Spoke with Diane at [facility] and pt is scheduled to see Quentin Mulling PA 05/06/23 at 10am. Diane is aware of appt.

## 2023-05-06 ENCOUNTER — Ambulatory Visit: Payer: Medicare PPO | Admitting: Physician Assistant

## 2023-05-06 ENCOUNTER — Encounter: Payer: Self-pay | Admitting: Physician Assistant

## 2023-05-06 ENCOUNTER — Other Ambulatory Visit: Payer: Self-pay | Admitting: Pharmacy Technician

## 2023-05-06 ENCOUNTER — Other Ambulatory Visit: Payer: Medicare PPO

## 2023-05-06 VITALS — BP 120/70 | HR 57

## 2023-05-06 DIAGNOSIS — D509 Iron deficiency anemia, unspecified: Secondary | ICD-10-CM | POA: Diagnosis not present

## 2023-05-06 DIAGNOSIS — K625 Hemorrhage of anus and rectum: Secondary | ICD-10-CM

## 2023-05-06 DIAGNOSIS — R1319 Other dysphagia: Secondary | ICD-10-CM

## 2023-05-06 DIAGNOSIS — I5020 Unspecified systolic (congestive) heart failure: Secondary | ICD-10-CM

## 2023-05-06 DIAGNOSIS — R195 Other fecal abnormalities: Secondary | ICD-10-CM

## 2023-05-06 DIAGNOSIS — I69359 Hemiplegia and hemiparesis following cerebral infarction affecting unspecified side: Secondary | ICD-10-CM

## 2023-05-06 LAB — CBC WITH DIFFERENTIAL/PLATELET
Basophils Absolute: 0 10*3/uL (ref 0.0–0.1)
Basophils Relative: 1.1 % (ref 0.0–3.0)
Eosinophils Absolute: 0.1 10*3/uL (ref 0.0–0.7)
Eosinophils Relative: 3.5 % (ref 0.0–5.0)
HCT: 31.7 % — ABNORMAL LOW (ref 39.0–52.0)
Hemoglobin: 10.4 g/dL — ABNORMAL LOW (ref 13.0–17.0)
Lymphocytes Relative: 38 % (ref 12.0–46.0)
Lymphs Abs: 1.6 10*3/uL (ref 0.7–4.0)
MCHC: 32.8 g/dL (ref 30.0–36.0)
MCV: 84.4 fL (ref 78.0–100.0)
Monocytes Absolute: 0.5 10*3/uL (ref 0.1–1.0)
Monocytes Relative: 10.8 % (ref 3.0–12.0)
Neutro Abs: 2 10*3/uL (ref 1.4–7.7)
Neutrophils Relative %: 46.6 % (ref 43.0–77.0)
Platelets: 454 10*3/uL — ABNORMAL HIGH (ref 150.0–400.0)
RBC: 3.75 Mil/uL — ABNORMAL LOW (ref 4.22–5.81)
RDW: 14.8 % (ref 11.5–15.5)
WBC: 4.3 10*3/uL (ref 4.0–10.5)

## 2023-05-06 LAB — COMPREHENSIVE METABOLIC PANEL
ALT: 7 U/L (ref 0–53)
AST: 13 U/L (ref 0–37)
Albumin: 3.4 g/dL — ABNORMAL LOW (ref 3.5–5.2)
Alkaline Phosphatase: 58 U/L (ref 39–117)
BUN: 7 mg/dL (ref 6–23)
CO2: 32 meq/L (ref 19–32)
Calcium: 8.9 mg/dL (ref 8.4–10.5)
Chloride: 101 meq/L (ref 96–112)
Creatinine, Ser: 1.16 mg/dL (ref 0.40–1.50)
GFR: 56.96 mL/min — ABNORMAL LOW (ref >60.00)
Glucose, Bld: 268 mg/dL — ABNORMAL HIGH (ref 70–99)
Potassium: 4 meq/L (ref 3.5–5.1)
Sodium: 138 meq/L (ref 135–145)
Total Bilirubin: 0.5 mg/dL (ref 0.2–1.2)
Total Protein: 6.7 g/dL (ref 6.0–8.3)

## 2023-05-06 LAB — IBC + FERRITIN
Ferritin: 12 ng/mL — ABNORMAL LOW (ref 22.0–322.0)
Iron: 31 ug/dL — ABNORMAL LOW (ref 42–165)
Saturation Ratios: 8.1 % — ABNORMAL LOW (ref 20.0–50.0)
TIBC: 385 ug/dL (ref 250.0–450.0)
Transferrin: 275 mg/dL (ref 212.0–360.0)

## 2023-05-06 MED ORDER — PANTOPRAZOLE SODIUM 20 MG PO TBEC: 20.0000 mg | DELAYED_RELEASE_TABLET | Freq: Every day | ORAL | 3 refills | Status: AC

## 2023-05-06 NOTE — Progress Notes (Signed)
05/06/2023 Bill Jackson [id number] 06-12-1935  Referring provider: No ref. provider found Primary GI doctor: Dr. Tomasa Rand  ASSESSMENT AND PLAN:   Iron Deficiency Anemia Flex sig in the hospital unremarkable Dark stools and constipation likely secondary to daily iron supplementation. However, the presence of black stools raises concern for potential upper GI bleeding. -Check CBC and iron levels today. -Continue iron supplementation, but consider IV iron if oral iron is not tolerated due to constipation. -Check CBC and iron levels monthly for the next three months at [facility]. Patient high risk for endoscopic procedures, will start on protonix, monitor CBC/iron, if he continues to have IDA despite iron use, will have to discuss EGD in the hospital.  - Will call if any worsening stools or any anemia.   Constipation Likely secondary to daily iron supplementation. -Consider reducing iron supplementation to every other day. -Consider adding MiraLAX and fiber to regimen.  Oropharyngeal Dysphagia Difficulty swallowing and aspiration risk. -Refer to speech therapy for evaluation and potential diet modification. -Start low-dose protonix for potential upper GI issues/GERD.  History of CVA with right hemiparesis Likely contributing to constipation   Follow-up in three months, with regular lab work in the interim.       Patient Care Team: Margit Hanks, MD (Inactive) as PCP - General (Internal Medicine) Chilton Si, MD as PCP - Cardiology (Cardiology)  HISTORY OF PRESENT ILLNESS: 88 y.o. male with a past medical history of nursing home resident, with history of prior hemorrhagic CVA with right hemiplegia, prior requirement for PEG tube, now on a dysphagia diet, history of adult onset diabetes mellitus, hyperlipidemia, and hypertension, systolic heart failure May 2024 EF 30% and others listed below presents for evaluation of hospital follow up.   9/18 through 9/21  hospital admission for rectal bleeding, patient described dark and reddish stools patient was not on anticoagulation.  Hgb on admission 10.53 g drop from May.   CT abdomen pelvis with contrast showed scattered hepatic cyst mild focal wall thickening enhancement along left lateral rectum 9/19 unsedated flexible sigmoidoscopy with Dr. Tomasa Rand showed unremarkable rectum without any mass lesion or mucosal abnormality there was old blood in the colon.  Presentation was most consistent with diverticular bleed. 04/24/2023 Hgb 8.5 at discharge  Discussed the use of AI scribe software for clinical note transcription with the patient, who gave verbal consent to proceed.  Patient presents from [facility]. He is in wheelchair and has aid.  Slightly difficult to understand but A&O x 3.  He states he has not had any BRB in stool but reports dark stools, he is on iron once a day 325.  He has BM once every two days, loose stools, having less stools with being on iron,  not black but darker than brown. He has a lot of gas with Bm's.  Since his CVA he has had trouble swallowing, IE can have grits/oatmeal can feel temporary with gagging/feeling he can't breath.  Has SLP eval in the hospital showed aspiration risk, dysphagia 2 diet and moderately thick liquid. CNA with him states he is on puree and thickened.  Had AB pain in the past but this has resolved.  Denies GERD, nausea, vomiting.  No SOB, no swelling in legs.   History of Present Illness   The patient, with a history of iron deficiency anemia, presents with a recent change in bowel habits and stool color. He reports having darker stools, which he describes as black. The patient is currently on  daily iron supplementation, which he believes may be contributing to these changes. He also reports constipation, which he attributes to the iron supplementation.  The patient has previously undergone a flexible sigmoidoscopy, but with the recent changes in stool  color, there is concern for potential upper gastrointestinal bleeding. He also reports issues with swallowing and the presence of phlegm in his throat. He has been experiencing oropharyngeal dysphagia, with liquids sometimes going into the wrong pipe, leading to aspiration.  The patient is currently residing in a care facility and has been managing his medications through this facility. He has been taking multiple medications, but the specifics are not mentioned in the conversation. The patient also reports a change in his diet, but the specifics of this change are not discussed.  The patient's humor and overall demeanor appear to be intact, as he engages in light-hearted conversation with the healthcare provider. However, he expresses concern about his current symptoms and the potential need for further intervention.         He  reports that he has been smoking cigarettes. He started smoking about 35 years ago. He has a 6.3 pack-year smoking history. He has never used smokeless tobacco. He reports that he does not drink alcohol and does not use drugs.  RELEVANT LABS AND IMAGING:  Results          CBC    Component Value Date/Time   WBC 4.9 04/24/2023 0608   RBC 3.01 (L) 04/24/2023 0608   HGB 8.5 (L) 04/24/2023 0608   HCT 26.3 (L) 04/24/2023 0608   PLT 250 04/24/2023 0608   MCV 87.4 04/24/2023 0608   MCH 28.2 04/24/2023 0608   MCHC 32.3 04/24/2023 0608   RDW 13.7 04/24/2023 0608   LYMPHSABS 1.8 04/24/2023 0608   MONOABS 0.7 04/24/2023 0608   EOSABS 0.2 04/24/2023 0608   BASOSABS 0.0 04/24/2023 0608   Recent Labs    12/10/22 0056 04/20/23 1936 04/21/23 0303 04/21/23 1131 04/21/23 1927 04/22/23 1126 04/22/23 1129 04/22/23 2154 04/23/23 0457 04/24/23 0608  HGB 13.2 10.5* 9.8* 9.7* 9.4* 8.9* 8.8* 8.5* 8.6* 8.5*    CMP     Component Value Date/Time   NA 139 04/24/2023 0608   K 3.6 04/24/2023 0608   CL 106 04/24/2023 0608   CO2 25 04/24/2023 0608   GLUCOSE 173 (H)  04/24/2023 0608   BUN 11 04/24/2023 0608   CREATININE 1.44 (H) 04/24/2023 0608   CALCIUM 8.1 (L) 04/24/2023 0608   PROT 5.4 (L) 04/20/2023 1936   ALBUMIN 2.5 (L) 04/20/2023 1936   AST 17 04/20/2023 1936   ALT 12 04/20/2023 1936   ALKPHOS 45 04/20/2023 1936   BILITOT 0.6 04/20/2023 1936   GFRNONAA 47 (L) 04/24/2023 0608   GFRAA 56 (L) 04/09/2018 1132      Latest Ref Rng & Units 04/20/2023    7:36 PM 12/11/2022    4:06 AM 12/10/2022   12:56 AM  Hepatic Function  Total Protein 6.5 - 8.1 g/dL 5.4  6.0  5.6   Albumin 3.5 - 5.0 g/dL 2.5  2.5  2.6   AST 15 - 41 U/L 17  56  64   ALT 0 - 44 U/L 12  25  23    Alk Phosphatase 38 - 126 U/L 45  58  58   Total Bilirubin 0.3 - 1.2 mg/dL 0.6  1.0  1.9       Current Medications:   Current Outpatient Medications (Endocrine & Metabolic):    insulin  glargine (LANTUS) 100 UNIT/ML injection, Inject 10-20 Units into the skin See admin instructions. If bs is  175 or below =10 units, if bs is 175 = 15 units. If bs is 200= 20 units  Current Outpatient Medications (Cardiovascular):    amiodarone (PACERONE) 200 MG tablet, Take 1 tablet (200 mg total) by mouth daily.  Current Outpatient Medications (Respiratory):    loratadine (CLARITIN) 10 MG tablet, Take 10 mg by mouth daily.  Current Outpatient Medications (Analgesics):    acetaminophen (TYLENOL) 325 MG tablet, Take 650 mg by mouth every 6 (six) hours as needed for mild pain.   Current Outpatient Medications (Other):    ammonium lactate (LAC-HYDRIN) 12 % lotion, Apply 1 Application topically 2 (two) times daily as needed for dry skin.   brimonidine (ALPHAGAN) 0.2 % ophthalmic solution, Place 1 drop into both eyes every 8 (eight) hours.   dorzolamide-timolol (COSOPT) 22.3-6.8 MG/ML ophthalmic solution, Place 1 drop into both eyes 2 (two) times daily.   latanoprost (XALATAN) 0.005 % ophthalmic solution, Place 1 drop into both eyes at bedtime.   Multiple Vitamin (MULTIVITAMIN WITH MINERALS) TABS, Take  1 tablet by mouth daily.   nicotine polacrilex (COMMIT) 2 MG lozenge, Take 2 mg by mouth as needed for smoking cessation.   Nutritional Supplements (BOOST GLUCOSE CONTROL) LIQD, Take 237 mLs by mouth with breakfast, with lunch, and with evening meal.   pantoprazole (PROTONIX) 20 MG tablet, Take 1 tablet (20 mg total) by mouth daily.   polyvinyl alcohol (LIQUIFILM TEARS) 1.4 % ophthalmic solution, Place 1 drop into both eyes 4 (four) times daily as needed for dry eyes.   triamcinolone cream (KENALOG) 0.1 %, Apply 1 Application topically 2 (two) times daily as needed (scalp lesion).  Medical History:  Past Medical History:  Diagnosis Date   Diabetes mellitus    Dysphagia following cerebrovascular accident    PEG tube   Hemorrhagic stroke (HCC) 12/10/2022   Hyperlipidemia    Hypertension    Left shoulder pain    Right knee pain    Stroke (HCC) 10/01/2012   R side weakness, slurred speech, dizziness, visual changes, ataxia; hemorrhagic cerebellar CVA, evacuated   Allergies:  Allergies  Allergen Reactions   Colestipol Other (See Comments)    Unknown reaction per [facility]   Ezetimibe Other (See Comments)    Unknown reaction per [facility]    Hydrocodone-Acetaminophen Other (See Comments)    Unknown reaction per [facility]    Lisinopril Cough   Lotensin [Benazepril Hcl] Cough    Not listed under allergies/reactions per [facility]   Lovastatin Other (See Comments)    Unknown reaction per [facility]  **Mevacor**   Penicillins Other (See Comments)    pt becomes "nervous" Has patient had a PCN reaction causing immediate rash, facial/tongue/throat swelling, SOB or lightheadedness with hypotension:  Has patient had a PCN reaction causing severe rash involving mucus membranes or skin necrosis:  Has patient had a PCN reaction that required hospitalization  Has patient had a PCN reaction occurring within the last 10 years:  If all of the above answers are "NO", then may proceed with Cephalosporin use.     Simvastatin Other (See Comments)    Unknown reaction per [facility]      Surgical History:  He  has a past surgical history that includes heart stents (x3); Cardiac surgery; Hernia repair; Rotator cuff repair; Craniectomy (N/A, 10/25/2012); PEG tube placement; and Flexible sigmoidoscopy (N/A, 04/22/2023). Family History:  His Family history is unknown by patient.  REVIEW  OF SYSTEMS  : All other systems reviewed and negative except where noted in the History of Present Illness.  PHYSICAL EXAM: BP 120/70   Pulse (!) 57  General Appearance: well appearing, in wheeling chair, elderly AAM. Head:   Normocephalic and atraumatic. Eyes:  sclerae anicteric,conjunctive pink  Respiratory: Respiratory effort normal, BS equal bilaterally without rales, rhonchi, wheezing. Cardio: RRR with no MRGs. Peripheral pulses intact.  Abdomen: Soft,  Obese ,active bowel sounds. mild tenderness in the lower abdomen. Without guarding and Without rebound. No masses. Rectal: declines Musculoskeletal: Full ROM, Non ambulatory gait, in wheelchair. With edema. Skin:  Dry and intact without significant lesions or rashes Neuro: Alert and  oriented x4;  dysarthria, right sided hemiparesis Psych:  Cooperative. Normal mood and affect.    Doree Albee, PA-C 12:16 PM

## 2023-05-06 NOTE — Patient Instructions (Addendum)
Your provider has requested that you go to the basement level for lab work before leaving today. Press "B" on the elevator. The lab is located at the first door on the left as you exit the elevator.  Hand written prescription has been given to you for Pantoprazole.   _______________________________________________________  If your blood pressure at your visit was 140/90 or greater, please contact your primary care physician to follow up on this.  _______________________________________________________  If you are age 37 or older, your body mass index should be between 23-30. Your There is no height or weight on file to calculate BMI. If this is out of the aforementioned range listed, please consider follow up with your Primary Care Provider.  If you are age 97 or younger, your body mass index should be between 19-25. Your There is no height or weight on file to calculate BMI. If this is out of the aformentioned range listed, please consider follow up with your Primary Care Provider.   ________________________________________________________  The [facility] providers would like to encourage you to use [facility] to communicate with providers for non-urgent requests or questions.  Due to long hold times on the telephone, sending your provider a message by [facility] may be a faster and more efficient way to get a response.  Please allow 48 business hours for a response.  Please remember that this is for non-urgent requests.  _______________________________________________________  Thank you for choosing me and  Gastroenterology.  Quentin Mulling, PA-C

## 2023-05-07 ENCOUNTER — Telehealth: Payer: Self-pay

## 2023-05-07 NOTE — Progress Notes (Signed)
Agree with the assessment and plan as outlined by Quentin Mulling,  PA-C.  Agree with monitoring for now, and proceeding with further endoscopic evaluation only if evidence of further bleeding.  [place] E. Tomasa Rand, MD

## 2023-05-07 NOTE — Telephone Encounter (Signed)
Auth Submission: NO AUTH NEEDED Site of care: Site of care: [facility] Payer: [employer] Medicare and [place] Medicaid Medication & CPT/J Code(s) submitted: Venofer (Iron Sucrose) J1756 Route of submission (phone, fax, portal): portal Phone # Fax # Auth type: Buy/Bill PB Units/visits requested: 300mg  x 2 doses and 400mg  x 1 dose Reference number:  Approval from: 05/07/23 to 08/03/23   I confirmed in the availity portal that Venofer does not need prior authorization for this patient.

## 2023-05-27 ENCOUNTER — Ambulatory Visit: Payer: Medicare PPO

## 2023-05-27 VITALS — BP 124/80 | HR 49 | Temp 97.6°F | Resp 18 | Ht 72.0 in | Wt 171.0 lb

## 2023-05-27 DIAGNOSIS — D509 Iron deficiency anemia, unspecified: Secondary | ICD-10-CM

## 2023-05-27 DIAGNOSIS — K922 Gastrointestinal hemorrhage, unspecified: Secondary | ICD-10-CM

## 2023-05-27 MED ORDER — IRON SUCROSE 300 MG IVPB - SIMPLE MED
300.0000 mg | Freq: Once | Status: AC
  Administered 2023-05-27: 300 mg via INTRAVENOUS
  Filled 2023-05-27: qty 265

## 2023-05-27 MED ORDER — ACETAMINOPHEN 325 MG PO TABS
650.0000 mg | ORAL_TABLET | Freq: Once | ORAL | Status: AC
  Administered 2023-05-27: 650 mg via ORAL
  Filled 2023-05-27: qty 2

## 2023-05-27 MED ORDER — DIPHENHYDRAMINE HCL 25 MG PO CAPS
25.0000 mg | ORAL_CAPSULE | Freq: Once | ORAL | Status: AC
  Administered 2023-05-27: 25 mg via ORAL
  Filled 2023-05-27: qty 1

## 2023-05-27 NOTE — Patient Instructions (Signed)
Iron Sucrose Injection What is this medication? IRON SUCROSE (EYE ern SOO krose) treats low levels of iron (iron deficiency anemia) in people with kidney disease. Iron is a mineral that plays an important role in making red blood cells, which carry oxygen from your lungs to the rest of your body. This medicine may be used for other purposes; ask your health care provider or pharmacist if you have questions. COMMON BRAND NAME(S): Venofer What should I tell my care team before I take this medication? They need to know if you have any of these conditions: Anemia not caused by low iron levels Heart disease High levels of iron in the blood Kidney disease Liver disease An unusual or allergic reaction to iron, other medications, foods, dyes, or preservatives Pregnant or trying to get pregnant Breastfeeding How should I use this medication? This medication is for infusion into a vein. It is given in a hospital or clinic setting. Talk to your care team about the use of this medication in children. While this medication may be prescribed for children as young as 2 years for selected conditions, precautions do apply. Overdosage: If you think you have taken too much of this medicine contact a poison control center or emergency room at once. NOTE: This medicine is only for you. Do not share this medicine with others. What if I miss a dose? Keep appointments for follow-up doses. It is important not to miss your dose. Call your care team if you are unable to keep an appointment. What may interact with this medication? Do not take this medication with any of the following: Deferoxamine Dimercaprol Other iron products This medication may also interact with the following: Chloramphenicol Deferasirox This list may not describe all possible interactions. Give your health care provider a list of all the medicines, herbs, non-prescription drugs, or dietary supplements you use. Also tell them if you smoke,  drink alcohol, or use illegal drugs. Some items may interact with your medicine. What should I watch for while using this medication? Visit your care team regularly. Tell your care team if your symptoms do not start to get better or if they get worse. You may need blood work done while you are taking this medication. You may need to follow a special diet. Talk to your care team. Foods that contain iron include: whole grains/cereals, dried fruits, beans, or peas, leafy green vegetables, and organ meats (liver, kidney). What side effects may I notice from receiving this medication? Side effects that you should report to your care team as soon as possible: Allergic reactions--skin rash, itching, hives, swelling of the face, lips, tongue, or throat Low blood pressure--dizziness, feeling faint or lightheaded, blurry vision Shortness of breath Side effects that usually do not require medical attention (report to your care team if they continue or are bothersome): Flushing Headache Joint pain Muscle pain Nausea Pain, redness, or irritation at injection site This list may not describe all possible side effects. Call your doctor for medical advice about side effects. You may report side effects to FDA at 1-800-FDA-1088. Where should I keep my medication? This medication is given in a hospital or clinic. It will not be stored at home. NOTE: This sheet is a summary. It may not cover all possible information. If you have questions about this medicine, talk to your doctor, pharmacist, or health care provider.  2024 Elsevier/Gold Standard (2022-12-25 00:00:00)

## 2023-05-27 NOTE — Progress Notes (Signed)
Diagnosis: Iron Deficiency Anemia  Provider:  Chilton Greathouse MD  Procedure: IV Infusion  IV Type: Peripheral, IV Location: L Antecubital  Venofer (Iron Sucrose), Dose: 300 mg  Infusion Start Time: 1015  Infusion Stop Time: 1203  Post Infusion IV Care: Observation period completed and Peripheral IV Discontinued  Discharge: Condition: Good, Destination: Home . AVS Provided  Performed by:  Adriana Mccallum, RN

## 2023-06-03 ENCOUNTER — Ambulatory Visit: Payer: Medicare PPO

## 2023-06-03 VITALS — BP 161/68 | HR 86 | Temp 97.7°F | Resp 16 | Ht 72.0 in | Wt 171.0 lb

## 2023-06-03 DIAGNOSIS — D509 Iron deficiency anemia, unspecified: Secondary | ICD-10-CM | POA: Diagnosis not present

## 2023-06-03 DIAGNOSIS — K922 Gastrointestinal hemorrhage, unspecified: Secondary | ICD-10-CM

## 2023-06-03 MED ORDER — ACETAMINOPHEN 325 MG PO TABS
650.0000 mg | ORAL_TABLET | Freq: Once | ORAL | Status: AC
  Administered 2023-06-03: 650 mg via ORAL
  Filled 2023-06-03: qty 2

## 2023-06-03 MED ORDER — DIPHENHYDRAMINE HCL 25 MG PO CAPS
25.0000 mg | ORAL_CAPSULE | Freq: Once | ORAL | Status: AC
  Administered 2023-06-03: 25 mg via ORAL
  Filled 2023-06-03: qty 1

## 2023-06-03 MED ORDER — IRON SUCROSE 300 MG IVPB - SIMPLE MED
300.0000 mg | Freq: Once | Status: AC
  Administered 2023-06-03: 300 mg via INTRAVENOUS
  Filled 2023-06-03: qty 265

## 2023-06-03 NOTE — Progress Notes (Signed)
Diagnosis: Iron Deficiency Anemia  Provider:  Chilton Greathouse MD  Procedure: IV Infusion  IV Type: Peripheral, IV Location: L Forearm  Venofer (Iron Sucrose), Dose: 300 mg  Infusion Start Time: 1052  Infusion Stop Time: 1235  Post Infusion IV Care: Observation period completed and Peripheral IV Discontinued  Discharge: Condition: Good, Destination: Rehab . AVS Provided  Performed by:  Adriana Mccallum, RN

## 2023-06-15 ENCOUNTER — Ambulatory Visit: Payer: Medicare PPO

## 2023-06-15 VITALS — BP 147/71 | HR 47 | Temp 98.0°F | Resp 18 | Ht 72.0 in | Wt 171.0 lb

## 2023-06-15 DIAGNOSIS — K921 Melena: Secondary | ICD-10-CM | POA: Diagnosis not present

## 2023-06-15 DIAGNOSIS — D509 Iron deficiency anemia, unspecified: Secondary | ICD-10-CM

## 2023-06-15 DIAGNOSIS — D500 Iron deficiency anemia secondary to blood loss (chronic): Secondary | ICD-10-CM | POA: Diagnosis not present

## 2023-06-15 DIAGNOSIS — K922 Gastrointestinal hemorrhage, unspecified: Secondary | ICD-10-CM

## 2023-06-15 MED ORDER — IRON SUCROSE 400 MG IVPB - SIMPLE MED
400.0000 mg | Freq: Once | Status: AC
  Administered 2023-06-15: 400 mg via INTRAVENOUS
  Filled 2023-06-15: qty 270

## 2023-06-15 MED ORDER — ACETAMINOPHEN 325 MG PO TABS
650.0000 mg | ORAL_TABLET | Freq: Once | ORAL | Status: DC
Start: 2023-06-15 — End: 2023-06-15

## 2023-06-15 MED ORDER — DIPHENHYDRAMINE HCL 25 MG PO CAPS: 25.0000 mg | ORAL_CAPSULE | Freq: Once | ORAL | Status: DC

## 2023-06-15 NOTE — Progress Notes (Signed)
Diagnosis: Iron Deficiency Anemia  Provider:  Chilton Greathouse MD  Procedure: IV Infusion  IV Type: Peripheral, IV Location: L Forearm  Venofer (Iron Sucrose), Dose: 400 mg  Infusion Start Time: 1202  Infusion Stop Time: 1445  Post Infusion IV Care: Patient declined observation and Peripheral IV Discontinued  Discharge: Condition: Good, Destination: Home . AVS Provided  Performed by:  Adriana Mccallum, RN

## 2023-06-22 ENCOUNTER — Emergency Department (HOSPITAL_COMMUNITY): Payer: Medicare PPO

## 2023-06-22 ENCOUNTER — Encounter (HOSPITAL_COMMUNITY): Payer: Self-pay

## 2023-06-22 ENCOUNTER — Emergency Department (HOSPITAL_COMMUNITY)
Admission: EM | Admit: 2023-06-22 | Discharge: 2023-06-22 | Disposition: A | Discharge status: Skilled Nursing Facility | Payer: Medicare PPO | Attending: Emergency Medicine | Admitting: Emergency Medicine

## 2023-06-22 ENCOUNTER — Other Ambulatory Visit: Payer: Self-pay

## 2023-06-22 DIAGNOSIS — R42 Dizziness and giddiness: Secondary | ICD-10-CM | POA: Insufficient documentation

## 2023-06-22 DIAGNOSIS — I110 Hypertensive heart disease with heart failure: Secondary | ICD-10-CM | POA: Diagnosis not present

## 2023-06-22 DIAGNOSIS — I509 Heart failure, unspecified: Secondary | ICD-10-CM | POA: Diagnosis not present

## 2023-06-22 DIAGNOSIS — Z794 Long term (current) use of insulin: Secondary | ICD-10-CM | POA: Insufficient documentation

## 2023-06-22 DIAGNOSIS — E119 Type 2 diabetes mellitus without complications: Secondary | ICD-10-CM | POA: Diagnosis not present

## 2023-06-22 DIAGNOSIS — I4891 Unspecified atrial fibrillation: Secondary | ICD-10-CM | POA: Diagnosis not present

## 2023-06-22 LAB — URINALYSIS, ROUTINE W REFLEX MICROSCOPIC
Bacteria, UA: NONE SEEN
Bilirubin Urine: NEGATIVE
Glucose, UA: 150 mg/dL — AB
Hgb urine dipstick: NEGATIVE
Ketones, ur: NEGATIVE mg/dL
Leukocytes,Ua: NEGATIVE
Nitrite: NEGATIVE
Protein, ur: 30 mg/dL — AB
Specific Gravity, Urine: 1.014 (ref 1.005–1.030)
pH: 6 (ref 5.0–8.0)

## 2023-06-22 LAB — BASIC METABOLIC PANEL
Anion gap: 6 (ref 5–15)
BUN: 11 mg/dL (ref 8–23)
CO2: 29 mmol/L (ref 22–32)
Calcium: 8.9 mg/dL (ref 8.9–10.3)
Chloride: 104 mmol/L (ref 98–111)
Creatinine, Ser: 1.24 mg/dL (ref 0.61–1.24)
GFR, Estimated: 57 mL/min — ABNORMAL LOW (ref >60)
Glucose, Bld: 272 mg/dL — ABNORMAL HIGH (ref 70–99)
Potassium: 4 mmol/L (ref 3.5–5.1)
Sodium: 139 mmol/L (ref 135–145)

## 2023-06-22 LAB — CBC
HCT: 39.5 % (ref 39.0–52.0)
Hemoglobin: 12.9 g/dL — ABNORMAL LOW (ref 13.0–17.0)
MCH: 28.7 pg (ref 26.0–34.0)
MCHC: 32.7 g/dL (ref 30.0–36.0)
MCV: 88 fL (ref 80.0–100.0)
Platelets: 283 10*3/uL (ref 150–400)
RBC: 4.49 MIL/uL (ref 4.22–5.81)
RDW: 15.7 % — ABNORMAL HIGH (ref 11.5–15.5)
WBC: 3.6 10*3/uL — ABNORMAL LOW (ref 4.0–10.5)
nRBC: 0 % (ref 0.0–0.2)

## 2023-06-22 NOTE — ED Triage Notes (Signed)
Pt bib EMS from [facility] for dizziness that started when he got up and went to the bathroom at 2:30.Pt told EMS he has dizziness for a couple of months but the facility denies that pt has c/o dizziness prior to tonight.

## 2023-06-22 NOTE — Discharge Instructions (Addendum)
Your labs today were normal, CT head also normal. Please follow-up with your doctor. Return here for new concerns.

## 2023-06-22 NOTE — ED Provider Notes (Signed)
[facility] AT [facility] Provider Note   CSN: [id number] Arrival date & time: 06/22/23  0434     History  Chief Complaint  Patient presents with   Dizziness    Bill Jackson is a 88 y.o. male.  The history is provided by the patient and medical records.  Dizziness  88 year old male with history of hypertension, congestive heart failure, prior stroke, diabetes, chronic gait disturbance/instability and dysarthria, presenting to the ED for dizziness.  Patient reports this has been ongoing for weeks to months.  He states whenever the facility puts him in bed at night and lays him flat he gets dizzy and feels like he may pass out.  He does not have any room spinning sensation.  He has not lost consciousness.  He denies any falls or head trauma.  He denies any pain, no headaches, chest pain, or shortness of breath.  He has not had any medication changes that he is aware of.  Does have history of A-fib but no longer on anticoagulation due to prior hemorrhagic stroke.  Home Medications Prior to Admission medications   Medication Sig Start Date End Date Taking? Authorizing Provider  acetaminophen (TYLENOL) 325 MG tablet Take 650 mg by mouth every 6 (six) hours as needed for mild pain.    [provider]  amiodarone (PACERONE) 200 MG tablet Take 1 tablet (200 mg total) by mouth daily. 12/17/22   Noralee Stain, DO  ammonium lactate (LAC-HYDRIN) 12 % lotion Apply 1 Application topically 2 (two) times daily as needed for dry skin.    [provider]  brimonidine (ALPHAGAN) 0.2 % ophthalmic solution Place 1 drop into both eyes every 8 (eight) hours. 01/19/13   Lucrezia Starch, NP  dorzolamide-timolol (COSOPT) 22.3-6.8 MG/ML ophthalmic solution Place 1 drop into both eyes 2 (two) times daily.    [provider]  insulin glargine (LANTUS) 100 UNIT/ML injection Inject 10-20 Units into the skin See admin instructions. If bs is  175 or below =10  units, if bs is 175 = 15 units. If bs is 200= 20 units    [provider]  latanoprost (XALATAN) 0.005 % ophthalmic solution Place 1 drop into both eyes at bedtime. 01/19/13   Lucrezia Starch, NP  loratadine (CLARITIN) 10 MG tablet Take 10 mg by mouth daily.    [provider]  Multiple Vitamin (MULTIVITAMIN WITH MINERALS) TABS Take 1 tablet by mouth daily. 01/19/13   Lucrezia Starch, NP  nicotine polacrilex (COMMIT) 2 MG lozenge Take 2 mg by mouth as needed for smoking cessation.    [provider]  Nutritional Supplements (BOOST GLUCOSE CONTROL) LIQD Take 237 mLs by mouth with breakfast, with lunch, and with evening meal.    [provider]  pantoprazole (PROTONIX) 20 MG tablet Take 1 tablet (20 mg total) by mouth daily. 05/06/23   Doree Albee, PA-C  polyvinyl alcohol (LIQUIFILM TEARS) 1.4 % ophthalmic solution Place 1 drop into both eyes 4 (four) times daily as needed for dry eyes.    [provider]  triamcinolone cream (KENALOG) 0.1 % Apply 1 Application topically 2 (two) times daily as needed (scalp lesion).    [provider]      Allergies    Colestipol, Ezetimibe, Hydrocodone-acetaminophen, Lisinopril, Lotensin [benazepril hcl], Lovastatin, Penicillins, and Simvastatin    Review of Systems   Review of Systems  Neurological:  Positive for dizziness.  All other systems reviewed and are negative.   Physical Exam  Updated Vital Signs BP 118/68 (BP Location: Left Arm)   Pulse (!) 52   Temp 97.7 F (36.5 C) (Oral)   Resp 18   Ht 6' (1.829 m)   Wt 77.6 kg   SpO2 99%   BMI 23.20 kg/m   Physical Exam Vitals and nursing note reviewed.  Constitutional:      Appearance: He is well-developed.     Comments: Elderly, very pleasant  HENT:     Head: Normocephalic and atraumatic.     Comments: No visible head trauma Eyes:     Conjunctiva/sclera: Conjunctivae normal.     Pupils: Pupils are equal, round, and reactive to light.   Cardiovascular:     Rate and Rhythm: Normal rate and regular rhythm.     Heart sounds: Normal heart sounds.  Pulmonary:     Effort: Pulmonary effort is normal.     Breath sounds: Normal breath sounds.  Abdominal:     General: Bowel sounds are normal.     Palpations: Abdomen is soft.     Tenderness: There is no abdominal tenderness. There is no guarding or rebound.     Comments: Soft, nontender  Musculoskeletal:        General: Normal range of motion.     Cervical back: Normal range of motion.  Skin:    General: Skin is warm and dry.  Neurological:     Mental Status: He is alert and oriented to person, place, and time.     Comments: AAOx3, able to answer questions and follow commands, speech is dysarthric (chronic)     ED Results / Procedures / Treatments   Labs (all labs ordered are listed, but only abnormal results are displayed) Labs Reviewed  CBC - Abnormal; Notable for the following components:      Result Value   WBC 3.6 (*)    Hemoglobin 12.9 (*)    RDW 15.7 (*)    All other components within normal limits  URINALYSIS, ROUTINE W REFLEX MICROSCOPIC - Abnormal; Notable for the following components:   Glucose, UA 150 (*)    Protein, ur 30 (*)    All other components within normal limits  BASIC METABOLIC PANEL - Abnormal; Notable for the following components:   Glucose, Bld 272 (*)    GFR, Estimated 57 (*)    All other components within normal limits  CBG MONITORING, ED    EKG EKG Interpretation Date/Time:  Tuesday June 22 2023 04:24:08 EST Ventricular Rate:  47 PR Interval:  214 QRS Duration:  148 QT Interval:  562 QTC Calculation: 497 R Axis:   -77  Text Interpretation: Sinus bradycardia with 1st degree A-V block Right bundle branch block Left anterior fascicular block Bifascicular block Septal infarct , age undetermined T wave abnormality, consider lateral ischemia Abnormal ECG When compared with ECG of 20-Apr-2023 19:51, PREVIOUS ECG IS PRESENT  Confirmed by Kennis Carina [phone]) on 06/22/2023 5:29:20 AM  Radiology CT HEAD WO CONTRAST ( )  Result Date: 06/22/2023 CLINICAL DATA:  Syncope/presyncope. EXAM: CT HEAD WITHOUT CONTRAST TECHNIQUE: Contiguous axial images were obtained from the base of the skull through the vertex without intravenous contrast. RADIATION DOSE REDUCTION: This exam was performed according to the departmental dose-optimization program which includes automated exposure control, adjustment of the mA and/or kV according to patient size and/or use of iterative reconstruction technique. COMPARISON:  Brain MRI 12/09/2022. FINDINGS: Brain: No evidence of acute infarction, hemorrhage, hydrocephalus, extra-axial collection or mass lesion/mass effect. Status post suboccipital craniectomy with encephalomalacia  involving the right cerebellar hemisphere. There is patchy low-attenuation within the subcortical and periventricular white matter compatible with chronic microvascular disease. Vascular: No hyperdense vessel or unexpected calcification. Skull: Previous suboccipital craniectomy.  Negative for fracture. Sinuses/Orbits: Mild opacification of the ethmoid air cells and sphenoid sinus. Other: None. IMPRESSION: 1. No acute intracranial abnormalities. 2. Status post suboccipital craniectomy with encephalomalacia involving the right cerebellar hemisphere. 3. Chronic microvascular disease. Electronically Signed   By: Signa Kell M.D.   On: 06/22/2023 06:10    Procedures Procedures    Medications Ordered in ED Medications - No data to display  ED Course/ Medical Decision Making/ A&P                                 Medical Decision Making Amount and/or Complexity of Data Reviewed Labs: ordered. Radiology: ordered and independent interpretation performed. ECG/medicine tests: ordered and independent interpretation performed.   88 year old male presenting to the ED with dizziness.  He reports this has been ongoing for  weeks-months whenever he is laid flat in bed at night to sleep.  Feels like he may pass out but never loses consciousness.  Otherwise he reports he is feeling fine.  He is awake, alert, oriented here.  He has baseline dysarthria which is well-documented in his chart along with chronic gait disturbance from prior stroke-- patient admits he does not really walk much anymore, uses wheelchair at facility.  He is answering questions and following commands without difficulty.  He denies any current dizziness.  Patient overall appears to be at his baseline.  Given his history, will obtain labs, UA, and CT head.  Labs as above--no leukocytosis or electrolyte derangement.  UA without signs of infection.  CT head is negative for any acute findings.  Patient remains at his apparent baseline here.  He has no acute complaint of dizziness, resting comfortably.  VSS.  Do not feel he needs emergent MRI at this time.  Feel he is stable for discharge back to facility.  Can follow-up closely with PCP.  Return here for new concerns.  Shared visit with attending physician, Dr. Pilar Plate, who evaluated patient and agrees with plan of care.  Final Clinical Impression(s) / ED Diagnoses Final diagnoses:  Dizziness    Rx / DC Orders ED Discharge Orders     None         Garlon Hatchet, PA-C 06/22/23 [id number]    Sabas Sous, MD 06/22/23 [phone]

## 2023-08-11 NOTE — Progress Notes (Signed)
 08/12/2023 Marsha JINNY Na [id number] Jul 10, 1935  Referring provider: Cheryle Page, MD Primary GI doctor: Dr. Stacia  ASSESSMENT AND PLAN:   Iron  Deficiency Anemia Flex sig in the hospital unremarkable Dark stools and constipation likely secondary to daily iron  supplementation.  - s/p IV iron  x 3, last was in Nov, still on iron  once daily -Check CBC and iron  levels today. -change iron  to 3 x a week versus stopping pending iron  studies Patient high risk for endoscopic procedures, if continuing iron  def/overt GI bleeding consider EGD in the hospital  Constipation Likely secondary to daily iron  supplementation/poor mobility -add on miralax  17 gram twice daily with the senokot - cut iron  back to 3 x a week  Oropharyngeal Dysphagia Difficulty swallowing and aspiration risk. -will repeat SLP, if negative consider barium swallow but sound more orophangeal  -continue 20 mg protonix  for potential upper GI issues/GERD.  History of CVA with right hemiparesis Likely contributing to constipation   Follow-up in three months, with regular lab work in the interim.       Patient Care Team: Marsa Arlean BIRCH, MD (Inactive) as PCP - General (Internal Medicine) Raford Riggs, MD as PCP - Cardiology (Cardiology)  HISTORY OF PRESENT ILLNESS: 89 y.o. male with a past medical history of nursing home resident, with history of prior hemorrhagic CVA with right hemiplegia, prior requirement for PEG tube, now on a dysphagia diet, history of adult onset diabetes mellitus, hyperlipidemia, and hypertension, systolic heart failure May 2024 EF 30% and others listed below presents for evaluation of hospital follow up.   9/18 through 9/21 hospital admission for rectal bleeding, patient described dark and reddish stools patient was not on anticoagulation.  Hgb on admission 10.5, 3 g drop from May.   CT abdomen pelvis with contrast showed scattered hepatic cyst mild focal wall thickening  enhancement along left lateral rectum 9/19 unsedated flexible sigmoidoscopy with Dr. Stacia showed unremarkable rectum without any mass lesion or mucosal abnormality there was old blood in the colon.  Presentation was most consistent with diverticular bleed. 04/24/2023 Hgb 8.5 at discharge 05/06/2023 office visit with myself for follow-up hospital admission Suggested repeat speech therapy evaluation for MBS Hgb 10.4, iron  31, ferritin 12 Referred for iron  infusions and here for follow-up.  Patient presents from [facility]. He is in wheelchair and has aid.  Slightly difficult to understand but A&O x 3.  He had IDA 11/12, 10/31, 10/24 He states he continues to have issues with swallowing, he states if he eats rice/cole slaw, will get caught in upper throat and he will have to cough it out. He is on chopped foods, states he has no issues with liquids.  He is on protonix  20 mg once a day.  He continues to have constipation, BM every 2-3 days, hard dark stools and can have blood in his stool and urine. He can have nausea with the BM.  He is still on iron  once daily 325 mg.   Has SLP eval in the hospital showed aspiration risk, dysphagia 2 diet and moderately thick liquid. CNA with him states he is on puree and thickened.    He  reports that he has been smoking cigarettes. He started smoking about 35 years ago. He has a 6.3 pack-year smoking history. He has never used smokeless tobacco. He reports that he does not drink alcohol and does not use drugs.  RELEVANT LABS AND IMAGING:  CBC    Component Value Date/Time   WBC 3.6 (L) 06/22/2023  0500   RBC 4.49 06/22/2023 0500   HGB 12.9 (L) 06/22/2023 0500   HCT 39.5 06/22/2023 0500   PLT 283 06/22/2023 0500   MCV 88.0 06/22/2023 0500   MCH 28.7 06/22/2023 0500   MCHC 32.7 06/22/2023 0500   RDW 15.7 (H) 06/22/2023 0500   LYMPHSABS 1.6 05/06/2023 1054   MONOABS 0.5 05/06/2023 1054   EOSABS 0.1 05/06/2023 1054   BASOSABS 0.0 05/06/2023 1054    Recent Labs    04/21/23 0303 04/21/23 1131 04/21/23 1927 04/22/23 1126 04/22/23 1129 04/22/23 2154 04/23/23 0457 04/24/23 0608 05/06/23 1054 06/22/23 0500  HGB 9.8* 9.7* 9.4* 8.9* 8.8* 8.5* 8.6* 8.5* 10.4* 12.9*    CMP     Component Value Date/Time   NA 139 06/22/2023 0500   K 4.0 06/22/2023 0500   CL 104 06/22/2023 0500   CO2 29 06/22/2023 0500   GLUCOSE 272 (H) 06/22/2023 0500   BUN 11 06/22/2023 0500   CREATININE 1.24 06/22/2023 0500   CALCIUM 8.9 06/22/2023 0500   PROT 6.7 05/06/2023 1054   ALBUMIN 3.4 (L) 05/06/2023 1054   AST 13 05/06/2023 1054   ALT 7 05/06/2023 1054   ALKPHOS 58 05/06/2023 1054   BILITOT 0.5 05/06/2023 1054   GFRNONAA 57 (L) 06/22/2023 0500   GFRAA 56 (L) 04/09/2018 1132      Latest Ref Rng & Units 05/06/2023   10:54 AM 04/20/2023    7:36 PM 12/11/2022    4:06 AM  Hepatic Function  Total Protein 6.0 - 8.3 g/dL 6.7  5.4  6.0   Albumin 3.5 - 5.2 g/dL 3.4  2.5  2.5   AST 0 - 37 U/L 13  17  56   ALT 0 - 53 U/L 7  12  25    Alk Phosphatase 39 - 117 U/L 58  45  58   Total Bilirubin 0.2 - 1.2 mg/dL 0.5  0.6  1.0       Current Medications:   Current Outpatient Medications (Endocrine & Metabolic):    insulin  glargine (LANTUS ) 100 UNIT/ML injection, Inject 10-20 Units into the skin See admin instructions. If bs is  175 or below =10 units, if bs is 175 = 15 units. If bs is 200= 20 units  Current Outpatient Medications (Cardiovascular):    amiodarone  (PACERONE ) 200 MG tablet, Take 1 tablet (200 mg total) by mouth daily.  Current Outpatient Medications (Respiratory):    loratadine  (CLARITIN ) 10 MG tablet, Take 10 mg by mouth daily.  Current Outpatient Medications (Analgesics):    acetaminophen  (TYLENOL ) 325 MG tablet, Take 650 mg by mouth every 6 (six) hours as needed for mild pain.   Current Outpatient Medications (Other):    ammonium lactate (LAC-HYDRIN) 12 % lotion, Apply 1 Application topically 2 (two) times daily as needed for dry  skin.   brimonidine  (ALPHAGAN ) 0.2 % ophthalmic solution, Place 1 drop into both eyes every 8 (eight) hours.   dorzolamide -timolol  (COSOPT ) 22.3-6.8 MG/ML ophthalmic solution, Place 1 drop into both eyes 2 (two) times daily.   latanoprost  (XALATAN ) 0.005 % ophthalmic solution, Place 1 drop into both eyes at bedtime.   Multiple Vitamin (MULTIVITAMIN WITH MINERALS) TABS, Take 1 tablet by mouth daily.   nicotine  polacrilex (COMMIT) 2 MG lozenge, Take 2 mg by mouth as needed for smoking cessation.   Nutritional Supplements (BOOST GLUCOSE CONTROL) LIQD, Take 237 mLs by mouth with breakfast, with lunch, and with evening meal.   pantoprazole  (PROTONIX ) 20 MG tablet, Take 1 tablet (20 mg  total) by mouth daily.   polyvinyl alcohol (LIQUIFILM TEARS) 1.4 % ophthalmic solution, Place 1 drop into both eyes 4 (four) times daily as needed for dry eyes.   triamcinolone cream (KENALOG) 0.1 %, Apply 1 Application topically 2 (two) times daily as needed (scalp lesion).  Medical History:  Past Medical History:  Diagnosis Date   Diabetes mellitus    Dysphagia following cerebrovascular accident    PEG tube   Hemorrhagic stroke (HCC) 12/10/2022   Hyperlipidemia    Hypertension    Left shoulder pain    Right knee pain    Stroke (HCC) 10/01/2012   R side weakness, slurred speech, dizziness, visual changes, ataxia; hemorrhagic cerebellar CVA, evacuated   Allergies:  Allergies  Allergen Reactions   Colestipol Other (See Comments)    Unknown reaction per [facility]   Ezetimibe Other (See Comments)    Unknown reaction per [facility]    Hydrocodone -Acetaminophen  Other (See Comments)    Unknown reaction per [facility]    Lisinopril Cough   Lotensin  [Benazepril  Hcl] Cough    Not listed under allergies/reactions per [facility]   Lovastatin Other (See Comments)    Unknown reaction per [facility]  **Mevacor**   Penicillins Other (See Comments)    pt becomes nervous Has patient had a PCN reaction causing immediate rash,  facial/tongue/throat swelling, SOB or lightheadedness with hypotension:  Has patient had a PCN reaction causing severe rash involving mucus membranes or skin necrosis:  Has patient had a PCN reaction that required hospitalization  Has patient had a PCN reaction occurring within the last 10 years:  If all of the above answers are NO, then may proceed with Cephalosporin use.    Simvastatin Other (See Comments)    Unknown reaction per [facility]      Surgical History:  He  has a past surgical history that includes heart stents (x3); Cardiac surgery; Hernia repair; Rotator cuff repair; Craniectomy (N/A, 10/25/2012); PEG tube placement; and Flexible sigmoidoscopy (N/A, 04/22/2023). Family History:  His Family history is unknown by patient.  REVIEW OF SYSTEMS  : All other systems reviewed and negative except where noted in the History of Present Illness.  PHYSICAL EXAM: BP 130/60 (BP Location: Right Arm, Patient Position: Sitting, Cuff Size: Normal)   Pulse (!) 48   Ht 6' (1.829 m)   Wt 173 lb 4 oz (78.6 kg)   SpO2 98%   BMI 23.50 kg/m  General Appearance: elderly appearing AAM in wheeling chair Head:   Normocephalic and atraumatic. Eyes:  sclerae anicteric,conjunctive pink  Respiratory: Respiratory effort normal, BS equal bilaterally without rales, rhonchi, wheezing. Cardio: RRR with no MRGs. Peripheral pulses intact.  Abdomen: Soft,  Obese ,active bowel sounds. mild tenderness in the lower abdomen. Without guarding and Without rebound. No masses. Rectal: Not evaluated Musculoskeletal: Full ROM, Non ambulatory gait, in wheelchair. With edema. Skin:  Dry and intact without significant lesions or rashes Neuro: Alert and  oriented x4;  dysarthria, right sided hemiparesis Psych:  Cooperative. Normal mood and affect.    Alan JONELLE Coombs, PA-C 11:26 AM

## 2023-08-12 ENCOUNTER — Encounter: Payer: Self-pay | Admitting: Physician Assistant

## 2023-08-12 ENCOUNTER — Other Ambulatory Visit (INDEPENDENT_AMBULATORY_CARE_PROVIDER_SITE_OTHER): Payer: Medicare PPO

## 2023-08-12 ENCOUNTER — Ambulatory Visit (INDEPENDENT_AMBULATORY_CARE_PROVIDER_SITE_OTHER): Payer: Medicare PPO | Admitting: Physician Assistant

## 2023-08-12 VITALS — BP 130/60 | HR 48 | Ht 72.0 in | Wt 173.2 lb

## 2023-08-12 DIAGNOSIS — D509 Iron deficiency anemia, unspecified: Secondary | ICD-10-CM | POA: Diagnosis not present

## 2023-08-12 DIAGNOSIS — R195 Other fecal abnormalities: Secondary | ICD-10-CM | POA: Diagnosis not present

## 2023-08-12 DIAGNOSIS — K5900 Constipation, unspecified: Secondary | ICD-10-CM | POA: Diagnosis not present

## 2023-08-12 DIAGNOSIS — R1312 Dysphagia, oropharyngeal phase: Secondary | ICD-10-CM

## 2023-08-12 DIAGNOSIS — I69359 Hemiplegia and hemiparesis following cerebral infarction affecting unspecified side: Secondary | ICD-10-CM

## 2023-08-12 DIAGNOSIS — I5020 Unspecified systolic (congestive) heart failure: Secondary | ICD-10-CM

## 2023-08-12 DIAGNOSIS — I69351 Hemiplegia and hemiparesis following cerebral infarction affecting right dominant side: Secondary | ICD-10-CM

## 2023-08-12 DIAGNOSIS — R1319 Other dysphagia: Secondary | ICD-10-CM

## 2023-08-12 LAB — IBC + FERRITIN
Ferritin: 88.1 ng/mL (ref 22.0–322.0)
Iron: 45 ug/dL (ref 42–165)
Saturation Ratios: 16 % — ABNORMAL LOW (ref 20.0–50.0)
TIBC: 281.4 ug/dL (ref 250.0–450.0)
Transferrin: 201 mg/dL — ABNORMAL LOW (ref 212.0–360.0)

## 2023-08-12 LAB — CBC WITH DIFFERENTIAL/PLATELET
Basophils Absolute: 0 10*3/uL (ref 0.0–0.1)
Basophils Relative: 0.4 % (ref 0.0–3.0)
Eosinophils Absolute: 0 10*3/uL (ref 0.0–0.7)
Eosinophils Relative: 0.7 % (ref 0.0–5.0)
HCT: 42.2 % (ref 39.0–52.0)
Hemoglobin: 13.9 g/dL (ref 13.0–17.0)
Lymphocytes Relative: 38.3 % (ref 12.0–46.0)
Lymphs Abs: 1.6 10*3/uL (ref 0.7–4.0)
MCHC: 32.9 g/dL (ref 30.0–36.0)
MCV: 88.3 fL (ref 78.0–100.0)
Monocytes Absolute: 0.3 10*3/uL (ref 0.1–1.0)
Monocytes Relative: 7.6 % (ref 3.0–12.0)
Neutro Abs: 2.2 10*3/uL (ref 1.4–7.7)
Neutrophils Relative %: 53 % (ref 43.0–77.0)
Platelets: 273 10*3/uL (ref 150.0–400.0)
RBC: 4.78 Mil/uL (ref 4.22–5.81)
RDW: 15.6 % — ABNORMAL HIGH (ref 11.5–15.5)
WBC: 4.2 10*3/uL (ref 4.0–10.5)

## 2023-08-12 LAB — COMPREHENSIVE METABOLIC PANEL
ALT: 7 U/L (ref 0–53)
AST: 12 U/L (ref 0–37)
Albumin: 3.8 g/dL (ref 3.5–5.2)
Alkaline Phosphatase: 57 U/L (ref 39–117)
BUN: 13 mg/dL (ref 6–23)
CO2: 31 meq/L (ref 19–32)
Calcium: 8.8 mg/dL (ref 8.4–10.5)
Chloride: 104 meq/L (ref 96–112)
Creatinine, Ser: 1.21 mg/dL (ref 0.40–1.50)
GFR: 54.04 mL/min — ABNORMAL LOW (ref >60.00)
Glucose, Bld: 74 mg/dL (ref 70–99)
Potassium: 4.1 meq/L (ref 3.5–5.1)
Sodium: 143 meq/L (ref 135–145)
Total Bilirubin: 0.6 mg/dL (ref 0.2–1.2)
Total Protein: 7 g/dL (ref 6.0–8.3)

## 2023-08-12 NOTE — Patient Instructions (Signed)
 Your provider has requested that you go to the basement level for lab work before leaving today. Press B on the elevator. The lab is located at the first door on the left as you exit the elevator.  Miralax  is an osmotic laxative.  It only brings more water  into the stool.  This is safe to take daily.  Can take up to 17 gram of miralax  twice a day.  Mix with juice or coffee.  Start 1 capful at night for 3-4 days and reassess your response in 3-4 days.  You can increase and decrease the dose based on your response.  Remember, it can take up to 3-4 days to take effect OR for the effects to wear off.   I often pair this with benefiber in the morning to help assure the stool is not too loose.   Change iron  325 mg to once 3 x a week  Will schedule for MBS, if negative, consider barium swallow  Suggest checking urine for hematuria/dysuria.

## 2023-08-16 NOTE — Progress Notes (Signed)
Agree with the assessment and plan as outlined by Amanda Collier,  PA-C.  Scott E. Cunningham, MD  

## 2023-09-13 ENCOUNTER — Other Ambulatory Visit: Payer: Self-pay | Admitting: *Deleted

## 2023-09-13 ENCOUNTER — Telehealth (HOSPITAL_COMMUNITY): Payer: Self-pay | Admitting: Physician Assistant

## 2023-09-13 NOTE — Telephone Encounter (Signed)
 Received call from [facility] asking about pt's OP MBS appointment. Discovered while the order was put in epic, it did not have location assigned, therefore it did not fall into either work queues. Order is under active requests since 08/12/23. Reached out to caregiver Edwina Gram who asked us  to call his facility [facility] [phone]) to coordinate appt. scheduling. The facility advised their scheduler (Diane) had left for the day and would return our call tomorrow morning. AHARRIS

## 2024-08-21 ENCOUNTER — Encounter (HOSPITAL_COMMUNITY): Payer: Self-pay

## 2024-08-21 ENCOUNTER — Other Ambulatory Visit: Payer: Self-pay

## 2024-08-21 ENCOUNTER — Observation Stay (HOSPITAL_COMMUNITY)
Admission: EM | Admit: 2024-08-21 | Discharge: 2024-08-24 | Disposition: A | Discharge status: Skilled Nursing Facility | Attending: Family Medicine | Admitting: Family Medicine

## 2024-08-21 ENCOUNTER — Emergency Department (HOSPITAL_COMMUNITY)

## 2024-08-21 DIAGNOSIS — D649 Anemia, unspecified: Secondary | ICD-10-CM | POA: Insufficient documentation

## 2024-08-21 DIAGNOSIS — I2510 Atherosclerotic heart disease of native coronary artery without angina pectoris: Secondary | ICD-10-CM | POA: Diagnosis not present

## 2024-08-21 DIAGNOSIS — K5289 Other specified noninfective gastroenteritis and colitis: Secondary | ICD-10-CM | POA: Diagnosis not present

## 2024-08-21 DIAGNOSIS — Z794 Long term (current) use of insulin: Secondary | ICD-10-CM | POA: Insufficient documentation

## 2024-08-21 DIAGNOSIS — I5020 Unspecified systolic (congestive) heart failure: Secondary | ICD-10-CM | POA: Diagnosis not present

## 2024-08-21 DIAGNOSIS — K626 Ulcer of anus and rectum: Secondary | ICD-10-CM

## 2024-08-21 DIAGNOSIS — K922 Gastrointestinal hemorrhage, unspecified: Principal | ICD-10-CM | POA: Insufficient documentation

## 2024-08-21 DIAGNOSIS — Z87891 Personal history of nicotine dependence: Secondary | ICD-10-CM | POA: Diagnosis not present

## 2024-08-21 DIAGNOSIS — E1122 Type 2 diabetes mellitus with diabetic chronic kidney disease: Secondary | ICD-10-CM | POA: Insufficient documentation

## 2024-08-21 DIAGNOSIS — Z79899 Other long term (current) drug therapy: Secondary | ICD-10-CM | POA: Diagnosis not present

## 2024-08-21 DIAGNOSIS — D62 Acute posthemorrhagic anemia: Secondary | ICD-10-CM | POA: Diagnosis not present

## 2024-08-21 DIAGNOSIS — K5900 Constipation, unspecified: Secondary | ICD-10-CM | POA: Diagnosis not present

## 2024-08-21 DIAGNOSIS — N1831 Chronic kidney disease, stage 3a: Secondary | ICD-10-CM | POA: Diagnosis not present

## 2024-08-21 DIAGNOSIS — K625 Hemorrhage of anus and rectum: Secondary | ICD-10-CM | POA: Diagnosis present

## 2024-08-21 DIAGNOSIS — R1310 Dysphagia, unspecified: Secondary | ICD-10-CM | POA: Diagnosis not present

## 2024-08-21 DIAGNOSIS — R9431 Abnormal electrocardiogram [ECG] [EKG]: Secondary | ICD-10-CM | POA: Insufficient documentation

## 2024-08-21 DIAGNOSIS — I130 Hypertensive heart and chronic kidney disease with heart failure and stage 1 through stage 4 chronic kidney disease, or unspecified chronic kidney disease: Secondary | ICD-10-CM | POA: Diagnosis not present

## 2024-08-21 DIAGNOSIS — R2681 Unsteadiness on feet: Secondary | ICD-10-CM | POA: Insufficient documentation

## 2024-08-21 DIAGNOSIS — E119 Type 2 diabetes mellitus without complications: Secondary | ICD-10-CM

## 2024-08-21 LAB — CBC WITH DIFFERENTIAL/PLATELET
Abs Immature Granulocytes: 0.04 K/uL (ref 0.00–0.07)
Basophils Absolute: 0 K/uL (ref 0.0–0.1)
Basophils Relative: 1 %
Eosinophils Absolute: 0.1 K/uL (ref 0.0–0.5)
Eosinophils Relative: 1 %
HCT: 38.7 % — ABNORMAL LOW (ref 39.0–52.0)
Hemoglobin: 12.7 g/dL — ABNORMAL LOW (ref 13.0–17.0)
Immature Granulocytes: 1 %
Lymphocytes Relative: 34 %
Lymphs Abs: 2.1 K/uL (ref 0.7–4.0)
MCH: 30.4 pg (ref 26.0–34.0)
MCHC: 32.8 g/dL (ref 30.0–36.0)
MCV: 92.6 fL (ref 80.0–100.0)
Monocytes Absolute: 0.6 K/uL (ref 0.1–1.0)
Monocytes Relative: 9 %
Neutro Abs: 3.4 K/uL (ref 1.7–7.7)
Neutrophils Relative %: 54 %
Platelets: 298 K/uL (ref 150–400)
RBC: 4.18 MIL/uL — ABNORMAL LOW (ref 4.22–5.81)
RDW: 13.2 % (ref 11.5–15.5)
WBC: 6.2 K/uL (ref 4.0–10.5)
nRBC: 0 % (ref 0.0–0.2)

## 2024-08-21 LAB — PROTIME-INR
INR: 1 (ref 0.8–1.2)
Prothrombin Time: 13.9 s (ref 11.4–15.2)

## 2024-08-21 LAB — TYPE AND SCREEN
ABO/RH(D): O POS
Antibody Screen: NEGATIVE

## 2024-08-21 LAB — COMPREHENSIVE METABOLIC PANEL WITH GFR
ALT: 8 U/L (ref 0–44)
AST: 18 U/L (ref 15–41)
Albumin: 3.4 g/dL — ABNORMAL LOW (ref 3.5–5.0)
Alkaline Phosphatase: 57 U/L (ref 38–126)
Anion gap: 10 (ref 5–15)
BUN: 31 mg/dL — ABNORMAL HIGH (ref 8–23)
CO2: 26 mmol/L (ref 22–32)
Calcium: 8.5 mg/dL — ABNORMAL LOW (ref 8.9–10.3)
Chloride: 104 mmol/L (ref 98–111)
Creatinine, Ser: 1.49 mg/dL — ABNORMAL HIGH (ref 0.61–1.24)
GFR, Estimated: 45 mL/min — ABNORMAL LOW
Glucose, Bld: 157 mg/dL — ABNORMAL HIGH (ref 70–99)
Potassium: 4 mmol/L (ref 3.5–5.1)
Sodium: 141 mmol/L (ref 135–145)
Total Bilirubin: 0.3 mg/dL (ref 0.0–1.2)
Total Protein: 6.3 g/dL — ABNORMAL LOW (ref 6.5–8.1)

## 2024-08-21 LAB — HEMOGLOBIN AND HEMATOCRIT, BLOOD
HCT: 35.7 % — ABNORMAL LOW (ref 39.0–52.0)
HCT: 33.6 % — ABNORMAL LOW (ref 39.0–52.0)
Hemoglobin: 11.9 g/dL — ABNORMAL LOW (ref 13.0–17.0)
Hemoglobin: 11.1 g/dL — ABNORMAL LOW (ref 13.0–17.0)

## 2024-08-21 LAB — GLUCOSE, CAPILLARY
Glucose-Capillary: 148 mg/dL — ABNORMAL HIGH (ref 70–99)
Glucose-Capillary: 154 mg/dL — ABNORMAL HIGH (ref 70–99)

## 2024-08-21 LAB — CBG MONITORING, ED
Glucose-Capillary: 156 mg/dL — ABNORMAL HIGH (ref 70–99)
Glucose-Capillary: 200 mg/dL — ABNORMAL HIGH (ref 70–99)

## 2024-08-21 LAB — HEMOGLOBIN A1C
Hgb A1c MFr Bld: 9.4 % — ABNORMAL HIGH (ref 4.8–5.6)
Mean Plasma Glucose: 223.08 mg/dL

## 2024-08-21 LAB — I-STAT CG4 LACTIC ACID, ED: Lactic Acid, Venous: 1 mmol/L (ref 0.5–1.9)

## 2024-08-21 LAB — LIPASE, BLOOD: Lipase: 68 U/L — ABNORMAL HIGH (ref 11–51)

## 2024-08-21 LAB — MAGNESIUM: Magnesium: 2.2 mg/dL (ref 1.7–2.4)

## 2024-08-21 LAB — POC OCCULT BLOOD, ED: Fecal Occult Bld: POSITIVE — AB

## 2024-08-21 MED ORDER — INSULIN ASPART 100 UNIT/ML IJ SOLN
0.0000 [IU] | INTRAMUSCULAR | Status: DC
  Administered 2024-08-21: 2 [IU] via SUBCUTANEOUS
  Administered 2024-08-21: 1 [IU] via SUBCUTANEOUS
  Administered 2024-08-21: 2 [IU] via SUBCUTANEOUS
  Administered 2024-08-21: 1 [IU] via SUBCUTANEOUS
  Administered 2024-08-22: 3 [IU] via SUBCUTANEOUS
  Administered 2024-08-22: 2 [IU] via SUBCUTANEOUS
  Administered 2024-08-22 – 2024-08-23 (×3): 5 [IU] via SUBCUTANEOUS
  Administered 2024-08-23: 2 [IU] via SUBCUTANEOUS
  Administered 2024-08-23: 7 [IU] via SUBCUTANEOUS
  Administered 2024-08-23 – 2024-08-24 (×3): 5 [IU] via SUBCUTANEOUS
  Administered 2024-08-24: 1 [IU] via SUBCUTANEOUS
  Administered 2024-08-24: 3 [IU] via SUBCUTANEOUS
  Administered 2024-08-24: 2 [IU] via SUBCUTANEOUS
  Filled 2024-08-21: qty 5
  Filled 2024-08-21: qty 2
  Filled 2024-08-21 (×2): qty 5
  Filled 2024-08-21 (×2): qty 2
  Filled 2024-08-21: qty 5
  Filled 2024-08-21: qty 2
  Filled 2024-08-21: qty 1
  Filled 2024-08-21: qty 3
  Filled 2024-08-21: qty 1
  Filled 2024-08-21: qty 3
  Filled 2024-08-21: qty 7
  Filled 2024-08-21: qty 2
  Filled 2024-08-21 (×2): qty 5
  Filled 2024-08-21: qty 2

## 2024-08-21 MED ORDER — SODIUM CHLORIDE 0.9 % IV SOLN: INTRAVENOUS | Status: DC

## 2024-08-21 MED ORDER — SODIUM CHLORIDE 0.9 % IV SOLN
2.0000 g | Freq: Once | INTRAVENOUS | Status: AC
  Administered 2024-08-21: 2 g via INTRAVENOUS
  Filled 2024-08-21: qty 20

## 2024-08-21 MED ORDER — SODIUM CHLORIDE 0.9 % IV BOLUS
500.0000 mL | Freq: Once | INTRAVENOUS | Status: AC
  Administered 2024-08-21: 500 mL via INTRAVENOUS

## 2024-08-21 MED ORDER — HYDROMORPHONE HCL 1 MG/ML IJ SOLN: 0.5000 mg | INTRAMUSCULAR | Status: DC | PRN

## 2024-08-21 MED ORDER — POLYETHYLENE GLYCOL 3350 17 G PO PACK
17.0000 g | PACK | Freq: Two times a day (BID) | ORAL | Status: DC
  Administered 2024-08-21 – 2024-08-23 (×4): 17 g via ORAL
  Filled 2024-08-21 (×4): qty 1

## 2024-08-21 MED ORDER — POLYVINYL ALCOHOL 1.4 % OP SOLN: 1.0000 [drp] | Freq: Four times a day (QID) | OPHTHALMIC | Status: DC | PRN

## 2024-08-21 MED ORDER — METRONIDAZOLE 500 MG/100ML IV SOLN
500.0000 mg | Freq: Once | INTRAVENOUS | Status: AC
  Administered 2024-08-21: 500 mg via INTRAVENOUS
  Filled 2024-08-21: qty 100

## 2024-08-21 MED ORDER — DORZOLAMIDE HCL-TIMOLOL MAL 2-0.5 % OP SOLN
1.0000 [drp] | Freq: Two times a day (BID) | OPHTHALMIC | Status: DC
  Administered 2024-08-22 – 2024-08-24 (×5): 1 [drp] via OPHTHALMIC
  Filled 2024-08-21: qty 10

## 2024-08-21 MED ORDER — ONDANSETRON HCL 4 MG/2ML IJ SOLN
4.0000 mg | Freq: Once | INTRAMUSCULAR | Status: AC
  Administered 2024-08-21: 4 mg via INTRAVENOUS
  Filled 2024-08-21: qty 2

## 2024-08-21 MED ORDER — BRIMONIDINE TARTRATE 0.2 % OP SOLN
1.0000 [drp] | Freq: Three times a day (TID) | OPHTHALMIC | Status: DC
  Administered 2024-08-22 – 2024-08-24 (×8): 1 [drp] via OPHTHALMIC
  Filled 2024-08-21: qty 5

## 2024-08-21 MED ORDER — SODIUM CHLORIDE 0.9 % IV SOLN: 1.0000 g | Freq: Once | INTRAVENOUS | Status: DC

## 2024-08-21 MED ORDER — BISACODYL 5 MG PO TBEC
10.0000 mg | DELAYED_RELEASE_TABLET | Freq: Every day | ORAL | Status: DC
  Administered 2024-08-21 – 2024-08-24 (×4): 10 mg via ORAL
  Filled 2024-08-21 (×4): qty 2

## 2024-08-21 MED ORDER — FERROUS SULFATE 325 (65 FE) MG PO TABS
325.0000 mg | ORAL_TABLET | ORAL | Status: DC
  Administered 2024-08-21 – 2024-08-23 (×2): 325 mg via ORAL
  Filled 2024-08-21 (×2): qty 1

## 2024-08-21 MED ORDER — ACETAMINOPHEN 10 MG/ML IV SOLN
1000.0000 mg | Freq: Four times a day (QID) | INTRAVENOUS | Status: AC
  Administered 2024-08-21 – 2024-08-22 (×4): 1000 mg via INTRAVENOUS
  Filled 2024-08-21 (×4): qty 100

## 2024-08-21 MED ORDER — AMIODARONE HCL 200 MG PO TABS
200.0000 mg | ORAL_TABLET | Freq: Every day | ORAL | Status: DC
  Administered 2024-08-21 – 2024-08-24 (×4): 200 mg via ORAL
  Filled 2024-08-21 (×4): qty 1

## 2024-08-21 MED ORDER — MORPHINE SULFATE (PF) 4 MG/ML IV SOLN
4.0000 mg | Freq: Once | INTRAVENOUS | Status: AC
  Administered 2024-08-21: 4 mg via INTRAVENOUS
  Filled 2024-08-21: qty 1

## 2024-08-21 MED ORDER — LATANOPROST 0.005 % OP SOLN
1.0000 [drp] | Freq: Every day | OPHTHALMIC | Status: DC
  Administered 2024-08-22 – 2024-08-23 (×2): 1 [drp] via OPHTHALMIC
  Filled 2024-08-21: qty 2.5

## 2024-08-21 MED ORDER — PANTOPRAZOLE SODIUM 40 MG IV SOLR
80.0000 mg | Freq: Once | INTRAVENOUS | Status: AC
  Administered 2024-08-21: 80 mg via INTRAVENOUS
  Filled 2024-08-21: qty 20

## 2024-08-21 MED ORDER — IOHEXOL 350 MG/ML SOLN
100.0000 mL | Freq: Once | INTRAVENOUS | Status: AC | PRN
  Administered 2024-08-21: 100 mL via INTRAVENOUS

## 2024-08-21 MED ORDER — PANTOPRAZOLE SODIUM 40 MG PO TBEC
40.0000 mg | DELAYED_RELEASE_TABLET | Freq: Two times a day (BID) | ORAL | Status: DC
  Administered 2024-08-22: 40 mg via ORAL
  Filled 2024-08-21: qty 1

## 2024-08-21 NOTE — ED Notes (Signed)
 Patient transported to CT

## 2024-08-21 NOTE — Consult Note (Signed)
 "                                               Consultation Note   Referring Provider:   Family Medicine Teaching Service PCP: Marsa Arlean BIRCH, MD (Inactive) Primary Gastroenterologist:    Glendia Holt, MD    Reason for Consultation: GI bleed DOA: 08/21/2024         Hospital Day: 1   Assessment and Plan:  89 yo male with  Acute on chronic constipation Lower GI bleeding Stercoral colitis CT angio showing large amount of retained stools and mild rectal wall thickening with perirectal stranding. Suspect bleeding secondary to stercoral proctitis but also could have bleeding from internal hemorrhoids and  / or anal fissure given history of straining and complaints of anorectal pain. CT angio negative for active bleeding and stool light brown on DRE   Chronic [place] anemia Takes oral iron  TID.  Presenting Hgb 12.7 ( down from 13.9 on 08/12/23 with GI bleed). Iron  studies no c/w iron  deficiency. Hgb down further this am to 11.9 but received 500 ml bolus earlier . Not active bleeding ( light brown stool on DRE)  Mild renal insuffiencey  QT prolongation  History of CVA  See PMH for any additional medical history  / medical problems  Plan --Has been having BMs overnight per nursing staff. Loose stool on my exam so suppose having overflow. Hasn't received any laxatives. Will purge bowels today.  --Needs more aggressive outpatient bowel regimen which should include glycerin supp daily --Trend H/H --KUB tomorrow to assess stool burden. --Following bowel purge will give steroid supp BID --He will probably do fine without antibiotics for the stercoral colitis.   Principal Problem:   GI bleed Active Problems:   Anemia   Stercoral colitis   QT prolongation  History of Present Illness:  Brief History:  Patient was seen in the hospital by us  September 2024 for i constipation , iron  deficiency anemia and blood in stool.  CT scan suggested some mild focal wall thickening in the rectum.   We did a flexible sigmoidoscopy which showed some blood in the rectum and sigmoid colon but there were no masses, proctitis or other findings.  Stools were felt to be dark on iron .  He was high risk for EGD so 1 was not pursued.  He was seen for hospital follow-up in January 2025.  He had been getting IV iron  as an outpatient.  He was changed to p.o. iron  . We were treating his chronic constipation with twice daily MiraLAX  and Senokot.  Follow-up iron  studies were normal.  He was continued on oral iron  3 times weekly.  Interval History:  Patient brought to ED from [facility] today for blood in stool.  Patient tells me he straining to have a bowel movement yesterday and subsequently had red blood in his stool.  He did notice some red blood clots in the stool.  He complains of pain in the rectum.   In the ED vital signs were initially stable, BP was low at 3:30 am but slowly improved over the following hour.    WBC 6.2, hemoglobin 12.7,  (13.9 earlier this month), BUN 31, creatinine 1.49, lipase 68, LFTs unremarkable,     Most recent Endoscopic Procedures:   04/22/2023 flexible sigmoidoscopy for evaluation of abnormal rectal findings on CT scan -- Blood  in the rectum and in the sigmoid colon.  No masses, lesion or proctitis found.  Recent Labs    08/21/24 0215  PROT 6.3*  ALBUMIN 3.4*  AST 18  ALT 8  ALKPHOS 57  BILITOT 0.3   Recent Labs    08/21/24 0215 08/21/24 0642  WBC 6.2  --   HGB 12.7* 11.9*  HCT 38.7* 35.7*  MCV 92.6  --   PLT 298  --    Recent Labs    08/21/24 0215  NA 141  K 4.0  CL 104  CO2 26  GLUCOSE 157*  BUN 31*  CREATININE 1.49*  CALCIUM 8.5*     CT ANGIO GI BLEED EXAM: CTA ABDOMEN AND PELVIS WITH CONTRAST 08/21/2024 05:57:07 AM  TECHNIQUE: CTA images of the abdomen and pelvis with intravenous contrast. Three-dimensional MIP/volume rendered formations were performed. Automated exposure control, iterative reconstruction, and/or weight based  adjustment of the mA/kV was utilized to reduce the radiation dose to as low as reasonably achievable.  COMPARISON: CT abdomen and pelvis with IV contrast 04/20/2023 and 12/09/2022.  CLINICAL HISTORY: acute GI bleed, h/o diverticular bleed with IR intervention  FINDINGS:  VASCULATURE: GI BLEED: No active extravasation of contrast within the GI tract.  AORTA: Heavy calcification in the abdominal aorta, particularly the infrarenal segment. No aneurysm, stenosis, dissection, or penetrating ulcer.  CELIAC TRUNK: Calcifications inferiorly in the proximal celiac artery. No more than 40% proximal celiac artery stenosis. The vessel is otherwise widely patent.  SUPERIOR MESENTERIC ARTERY: Moderate to heavy calcific plaques up to the vessel inflection point. No more than 40% irregular stenosis and otherwise patent.  INFERIOR MESENTERIC ARTERY: No acute finding. No occlusion or significant stenosis.  RENAL ARTERIES: Calcific plaques at both renal artery origins. Both vessels are widely patent without hilar branch occlusion.  ILIAC ARTERIES: Bilateral iliac arteries are heavily calcified. The common iliac and external iliac arteries exhibit no flow-limiting stenosis.  Moderate to severe irregular stenosis in the right internal iliac artery. Calcific occlusion of the left internal iliac artery which reconstitutes in the dorsal left pelvic sidewall.  No occlusive disease of the proximal outflow arteries.  ABDOMEN/PELVIS: LOWER CHEST: Posterior atelectasis in the lung bases without acute process. Moderate bilateral gynecomastia. The cardiac size is normal.  There are left main and 2 vessel coronary calcifications in the left anterior descending and circumflex, greatest in the left anterior descending.  LIVER: Multiple hepatic cysts, the largest is in the dome of segment 8 measuring 5.3 cm. No new abnormality is seen in the liver.  GALLBLADDER AND BILE DUCTS: Gallbladder is  unremarkable. No biliary ductal dilatation.  SPLEEN: The spleen is unremarkable.  PANCREAS: The pancreas is unremarkable.  ADRENAL GLANDS: No adrenal mass.  KIDNEYS, URETERS AND BLADDER: Occasional few bilateral Bosniak 2 subcentimeter cortical cysts which are too small to characterize. No follow-up imaging is recommended.  No stones in the kidneys or ureters. No hydronephrosis. No perinephric or periureteral stranding. There is a 2.4 cm right posterolateral bladder wall diverticulum, unchanged.  The bladder is otherwise unremarkable.  GI AND BOWEL: Thickened folds in the stomach, most likely due to gastritis or peptic ulcer disease. No inflammatory changes are seen. There is no small bowel obstruction.  The appendix is normal. There is a large amount of retained stool, particularly in the rectosigmoid segment.  There is mild wall thickening in the rectum with perirectal stranding compatible with stercoral proctitis.  REPRODUCTIVE: Reproductive organs are unremarkable.  PERITONEUM AND RETROPERITONEUM: Trace reactive fluid in  the dorsal deep pelvis but no free hemorrhage, free fluid, pneumatosis, or abscess.  LYMPH NODES: No lymphadenopathy.  BONES AND SOFT TISSUES: Osteopenia, mild dextroscoliosis and degenerative changes of the lumbar spine. No acute or significant osseous findings.  Bilateral small inguinal hernias without incarceration. No acute soft tissue abnormality.  IMPRESSION: 1. No active GI bleeding. 2. Mild rectal wall thickening with perirectal stranding compatible with stercoral proctitis. Large amount of retained stool. 3. 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 . 4. Coronary calcifications heaviest in the lad. Aortic and branch  vessel atherosclerosis as described above. 5. Bilateral gynecomastia , diffuse fibroglandular type.  Electronically signed by: Francis Quam MD 08/21/2024 06:34 AM EST RP Workstation: [id number]     Review of Systems: Positive for dizziness ( chronic) All systems reviewed and negative except where noted in HPI.  Physical Exam: Vital signs in last 24 hours: Temp:  [97.5 F (36.4 C)-97.8 F (36.6 C)] 97.8 F (36.6 C) (01/19 1052) Pulse Rate:  [65-81] 76 (01/19 1045) Resp:  [14-21] 16 (01/19 1045) BP: (66-134)/(46-70) 109/66 (01/19 1045) SpO2:  [96 %-100 %] 100 % (01/19 1045)   General:  Pleasant male in NAD Psych:  Cooperative. Normal mood and affect Eyes: Pupils equal Ears:  Normal auditory acuity Nose: No deformity, discharge or lesions Neck:  Supple, no masses felt Lungs:  Clear to auscultation.  Heart:  Regular rate, regular rhythm.  Abdomen:  Soft, nondistended, nontender, active bowel sounds, no masses felt Rectal :  No obvious perianal lesions but poor lighting in ED. Limited DRE due to patient clenching / discomfort. Loose, light brown stool in vault. No blood present. Msk: Symmetrical without gross deformities.  Neurologic:  Alert, oriented, altered speech secondary to stroke  Extremities : No edema Skin:  Intact without significant lesions.   OUTPATIENT MEDICATIONS Prior to Admission medications  Medication Sig Start Date End Date Taking? Authorizing Provider  acetaminophen  (TYLENOL ) 325 MG tablet Take 650 mg by mouth every 6 (six) hours as needed for mild pain.   Yes [provider]  amiodarone  (PACERONE ) 200 MG tablet Take 1 tablet (200 mg total) by mouth daily. 12/17/22  Yes Rojelio Nest, DO  ammonium lactate (LAC-HYDRIN) 12 % lotion Apply 1 Application topically 2 (two) times daily as needed for dry skin.   Yes [provider]  brimonidine  (ALPHAGAN ) 0.2 % ophthalmic solution Place 1 drop into both eyes every 8 (eight) hours. 01/19/13  Yes  Court Lavender, NP  dorzolamide -timolol  (COSOPT ) 22.3-6.8 MG/ML ophthalmic solution Place 1 drop into both eyes 2 (two) times daily.   Yes [provider]  ferrous sulfate  325 (65 FE) MG tablet Take 325 mg by mouth every Monday, Wednesday, and Friday.   Yes [provider]  insulin  glargine (LANTUS ) 100 UNIT/ML injection Inject 10-20 Units into the skin See admin instructions. If bs is  175 or below =10 units, if bs is 175 = 15 units. If bs is 200= 20 units   Yes [provider]  JARDIANCE 10 MG TABS tablet Take 10 mg by mouth daily. 08/17/24  Yes [provider]  latanoprost  (XALATAN ) 0.005 % ophthalmic solution Place 1 drop into both eyes at bedtime. 01/19/13  Yes Court Lavender, NP  loratadine  (CLARITIN ) 10 MG tablet Take 10 mg by mouth daily.   Yes [provider]  Multiple Vitamin (MULTIVITAMIN WITH MINERALS) TABS Take 1 tablet by mouth daily. 01/19/13  Yes Court Lavender, NP  nicotine  polacrilex (COMMIT) 2 MG lozenge Take 2 mg by mouth as needed for smoking cessation.   Yes [provider]  NOVOLOG  FLEXPEN 100 UNIT/ML FlexPen Inject 2-9 Units into the skin 4 (four) times daily -  before meals and at bedtime. If blood sugar is 121-150= 2 units, 201-250= 3 units, 251-300= 5 units, 301-350= 7 units, 351-400= 9 units, if greater than 400 call provider 07/17/24  Yes [provider]  pantoprazole  (PROTONIX ) 20 MG tablet Take 1 tablet (20 mg total) by mouth daily. 05/06/23  Yes Craig Palma R, PA-C  polyethylene glycol (MIRALAX  / GLYCOLAX ) 17 g packet Take 17 g by mouth daily.   Yes [provider]  polyvinyl alcohol  (LIQUIFILM TEARS) 1.4 % ophthalmic solution Place 1 drop into both eyes 4 (four) times daily as needed for dry eyes.   Yes [provider]  Salicylic Acid 3 % SHAM Apply 1 Application topically See admin instructions. Apply to scalp every Tuesday and Friday   Yes [provider]  senna-docusate (SENOKOT-S)  8.6-50 MG tablet Take 1 tablet by mouth 2 (two) times daily.   Yes [provider]  urea (CARMOL) 20 % cream Apply 1 application  topically 2 (two) times daily. Apply a dime size amount   Yes [provider]  Nutritional Supplements (BOOST GLUCOSE CONTROL) LIQD Take 237 mLs by mouth with breakfast, with lunch, and with evening meal.    [provider]  triamcinolone cream (KENALOG) 0.1 % Apply 1 Application topically 2 (two) times daily as needed (scalp lesion). Patient not taking: Reported on 08/21/2024    [provider]    Allergies as of 08/21/2024 - Review Complete 08/21/2024  Allergen Reaction Noted   Colestipol Other (See Comments) 08/20/2004   Ezetimibe Other (See Comments) 12/17/2004   Hydrocodone -acetaminophen  Other (See Comments) 10/16/2002   Lisinopril Cough 01/19/2003   Lotensin  [benazepril  hcl] Cough 10/29/2012   Lovastatin Other (See Comments) 01/19/2003   Penicillins Other (See Comments) 12/05/2011   Simvastatin Other (See Comments) 04/07/2002    INPATIENT MEDICATIONS Current Facility-Administered Medications  Medication Dose Route Frequency Provider Last Rate Last Admin   acetaminophen  (OFIRMEV ) IV 1,000 mg  1,000 mg Intravenous Q6H Nicholas Bar, MD       amiodarone  (PACERONE ) tablet 200 mg  200 mg Oral Daily Boyina, Akhila, MD   200 mg at 08/21/24 1043   artificial tears ophthalmic solution 1 drop  1 drop Both Eyes QID PRN Boyina, Akhila, MD       brimonidine  (ALPHAGAN ) 0.2 % ophthalmic solution 1 drop  1 drop Both Eyes Q8H Boyina, Akhila, MD       dorzolamide -timolol  (COSOPT ) 2-0.5 % ophthalmic solution 1 drop  1 drop Both Eyes BID Boyina, Akhila, MD       ferrous sulfate  tablet 325 mg  325 mg Oral Q M,W,F Boyina, Akhila, MD   325 mg at 08/21/24 1043   HYDROmorphone  (DILAUDID ) injection 0.5 mg  0.5 mg Intravenous Q4H PRN Nicholas Bar, MD       insulin  aspart (novoLOG ) injection 0-9 Units  0-9 Units Subcutaneous Q4H Boyina, Akhila,  MD   2 Units at 08/21/24 1044   latanoprost  (XALATAN ) 0.005 % ophthalmic solution 1 drop  1 drop Both Eyes QHS Boyina, Akhila, MD       [START ON 08/22/2024] pantoprazole  (PROTONIX ) EC tablet 40 mg  40 mg Oral BID  Nicholas Bar, MD       Current Outpatient Medications  Medication Sig Dispense Refill   acetaminophen  (TYLENOL ) 325 MG tablet Take 650 mg by mouth every 6 (six) hours as needed for mild pain.     amiodarone  (PACERONE ) 200 MG tablet Take 1 tablet (200 mg total) by mouth daily. 30 tablet 0   ammonium lactate (LAC-HYDRIN) 12 % lotion Apply 1 Application topically 2 (two) times daily as needed for dry skin.     brimonidine  (ALPHAGAN ) 0.2 % ophthalmic solution Place 1 drop into both eyes every 8 (eight) hours. 5 mL 0   dorzolamide -timolol  (COSOPT ) 22.3-6.8 MG/ML ophthalmic solution Place 1 drop into both eyes 2 (two) times daily.     ferrous sulfate  325 (65 FE) MG tablet Take 325 mg by mouth every Monday, Wednesday, and Friday.     insulin  glargine (LANTUS ) 100 UNIT/ML injection Inject 10-20 Units into the skin See admin instructions. If bs is  175 or below =10 units, if bs is 175 = 15 units. If bs is 200= 20 units     JARDIANCE 10 MG TABS tablet Take 10 mg by mouth daily.     latanoprost  (XALATAN ) 0.005 % ophthalmic solution Place 1 drop into both eyes at bedtime. 2.5 mL 0   loratadine  (CLARITIN ) 10 MG tablet Take 10 mg by mouth daily.     Multiple Vitamin (MULTIVITAMIN WITH MINERALS) TABS Take 1 tablet by mouth daily.     nicotine  polacrilex (COMMIT) 2 MG lozenge Take 2 mg by mouth as needed for smoking cessation.     NOVOLOG  FLEXPEN 100 UNIT/ML FlexPen Inject 2-9 Units into the skin 4 (four) times daily -  before meals and at bedtime. If blood sugar is 121-150= 2 units, 201-250= 3 units, 251-300= 5 units, 301-350= 7 units, 351-400= 9 units, if greater than 400 call provider     pantoprazole  (PROTONIX ) 20 MG tablet Take 1 tablet (20 mg total) by mouth daily. 90 tablet 3   polyethylene  glycol (MIRALAX  / GLYCOLAX ) 17 g packet Take 17 g by mouth daily.     polyvinyl alcohol  (LIQUIFILM TEARS) 1.4 % ophthalmic solution Place 1 drop into both eyes 4 (four) times daily as needed for dry eyes.     Salicylic Acid 3 % SHAM Apply 1 Application topically See admin instructions. Apply to scalp every Tuesday and Friday     senna-docusate (SENOKOT-S) 8.6-50 MG tablet Take 1 tablet by mouth 2 (two) times daily.     urea (CARMOL) 20 % cream Apply 1 application  topically 2 (two) times daily. Apply a dime size amount     Nutritional Supplements (BOOST GLUCOSE CONTROL) LIQD Take 237 mLs by mouth with breakfast, with lunch, and with evening meal.     triamcinolone cream (KENALOG) 0.1 % Apply 1 Application topically 2 (two) times daily as needed (scalp lesion). (Patient not taking: Reported on 08/21/2024)       Past Medical History:  Diagnosis Date   Diabetes mellitus    Dysphagia following cerebrovascular accident    PEG tube   Hemorrhagic stroke (HCC) 12/10/2022   Hyperlipidemia    Hypertension    Left shoulder pain    Right knee pain    Stroke (HCC) 10/01/2012   R side weakness, slurred speech, dizziness, visual changes, ataxia; hemorrhagic cerebellar CVA, evacuated    Past Surgical History:  Procedure Laterality Date   CARDIAC SURGERY     CRANIECTOMY N/A 10/25/2012   Procedure: Suboccipital Craniectomy for Evacuation  of Cerebellar Hematoma;  Surgeon: Fairy Levels, MD;  Location: [facility];  Service: Neurosurgery;  Laterality: N/A;  Suboccipital Craniectomy for Evacuation of Cerebellar Hematoma   FLEXIBLE SIGMOIDOSCOPY N/A 04/22/2023   Procedure: FLEXIBLE SIGMOIDOSCOPY;  Surgeon: Stacia Glendia BRAVO, MD;  Location: [facility];  Service: Gastroenterology;  Laterality: N/A;   heart stents  x3   HERNIA REPAIR     PEG TUBE PLACEMENT     placed ?2014, changed 01/2014 (at [facility])   ROTATOR CUFF REPAIR      Family History  Family history unknown: Yes    Social History    Socioeconomic History   Marital status: Divorced    Spouse name: Not on file   Number of children: Not on file   Years of education: Not on file   Highest education level: Not on file  Occupational History   Not on file  Tobacco Use   Smoking status: Every Day    Current packs/day: 0.00    Average packs/day: 0.3 packs/day for 25.0 years (6.3 ttl pk-yrs)    Types: Cigarettes    Start date: 04/03/1988    Last attempt to quit: 04/03/2013    Years since quitting: 11.3   Smokeless tobacco: Never  Vaping Use   Vaping status: Never Used  Substance and Sexual Activity   Alcohol  use: No   Drug use: No   Sexual activity: Not on file  Other Topics Concern   Not on file  Social History Narrative   Not on file   Social Drivers of Health   Tobacco Use: High Risk (08/21/2024)   Patient History    Smoking Tobacco Use: Every Day    Smokeless Tobacco Use: Never    Passive Exposure: Not on file  Financial Resource Strain: Not on file  Food Insecurity: No Food Insecurity (04/21/2023)   Hunger Vital Sign    Worried About Running Out of Food in the Last Year: Never true    Ran Out of Food in the Last Year: Never true  Transportation Needs: No Transportation Needs (04/21/2023)   PRAPARE - Administrator, Civil Service (Medical): No    Lack of Transportation (Non-Medical): No  Physical Activity: Not on file  Stress: Not on file  Social Connections: Not on file  Intimate Partner Violence: Not At Risk (04/21/2023)   Humiliation, Afraid, Rape, and Kick questionnaire    Fear of Current or Ex-Partner: No    Emotionally Abused: No    Physically Abused: No    Sexually Abused: No  Depression (PHQ2-9): Low Risk (05/27/2023)   Depression (PHQ2-9)    PHQ-2 Score: 0  Alcohol  Screen: Not on file  Housing: Low Risk (04/21/2023)   Housing    Last Housing Risk Score: 0  Utilities: Not At Risk (04/21/2023)   [employer] Utilities    Threatened with loss of utilities: No  Health Literacy: Not on  file    Code Status   Code Status: Full Code   Vina Dasen, NP-C   08/21/2024, 11:49 AM     "

## 2024-08-21 NOTE — ED Provider Notes (Signed)
 " [place] EMERGENCY DEPARTMENT AT [facility] Provider Note   CSN: [id number] Arrival date & time: 08/21/24  0201     Patient presents with: GI Bleeding   Bill Jackson is a 89 y.o. male.   Patient presents to the emergency department for evaluation of possible GI bleed.  Patient attempting to have a bowel movement this morning when it was noted that he passed a moderate amount of dark blood and clots.  At arrival to the ED patient reports diffuse abdominal pain.       Prior to Admission medications  Medication Sig Start Date End Date Taking? Authorizing Provider  acetaminophen  (TYLENOL ) 325 MG tablet Take 650 mg by mouth every 6 (six) hours as needed for mild pain.   Yes [provider]  amiodarone  (PACERONE ) 200 MG tablet Take 1 tablet (200 mg total) by mouth daily. 12/17/22  Yes Rojelio Nest, DO  ammonium lactate (LAC-HYDRIN) 12 % lotion Apply 1 Application topically 2 (two) times daily as needed for dry skin.   Yes [provider]  brimonidine  (ALPHAGAN ) 0.2 % ophthalmic solution Place 1 drop into both eyes every 8 (eight) hours. 01/19/13  Yes Court Lavender, NP  dorzolamide -timolol  (COSOPT ) 22.3-6.8 MG/ML ophthalmic solution Place 1 drop into both eyes 2 (two) times daily.   Yes [provider]  ferrous sulfate  325 (65 FE) MG tablet Take 325 mg by mouth every Monday, Wednesday, and Friday.   Yes [provider]  insulin  glargine (LANTUS ) 100 UNIT/ML injection Inject 10-20 Units into the skin See admin instructions. If bs is  175 or below =10 units, if bs is 175 = 15 units. If bs is 200= 20 units   Yes [provider]  JARDIANCE 10 MG TABS tablet Take 10 mg by mouth daily. 08/17/24  Yes [provider]  latanoprost  (XALATAN ) 0.005 % ophthalmic solution Place 1 drop into both eyes at bedtime. 01/19/13  Yes Court Lavender, NP  loratadine  (CLARITIN ) 10 MG tablet Take 10 mg by mouth daily.   Yes [provider]   Multiple Vitamin (MULTIVITAMIN WITH MINERALS) TABS Take 1 tablet by mouth daily. 01/19/13  Yes Court Lavender, NP  nicotine  polacrilex (COMMIT) 2 MG lozenge Take 2 mg by mouth as needed for smoking cessation.   Yes [provider]  NOVOLOG  FLEXPEN 100 UNIT/ML FlexPen Inject 2-9 Units into the skin 4 (four) times daily -  before meals and at bedtime. If blood sugar is 121-150= 2 units, 201-250= 3 units, 251-300= 5 units, 301-350= 7 units, 351-400= 9 units, if greater than 400 call provider 07/17/24  Yes [provider]  pantoprazole  (PROTONIX ) 20 MG tablet Take 1 tablet (20 mg total) by mouth daily. 05/06/23  Yes Craig Palma R, PA-C  polyethylene glycol (MIRALAX  / GLYCOLAX ) 17 g packet Take 17 g by mouth daily.   Yes [provider]  polyvinyl alcohol  (LIQUIFILM TEARS) 1.4 % ophthalmic solution Place 1 drop into both eyes 4 (four) times daily as needed for dry eyes.   Yes [provider]  Salicylic Acid 3 % SHAM Apply 1 Application topically See admin instructions. Apply to scalp every Tuesday and Friday   Yes [provider]  senna-docusate (SENOKOT-S) 8.6-50 MG tablet Take 1 tablet by mouth 2 (two) times daily.   Yes [provider]  urea (CARMOL) 20 % cream Apply 1 application  topically 2 (two) times daily. Apply a dime size amount   Yes [provider]  Nutritional  Supplements (BOOST GLUCOSE CONTROL) LIQD Take 237 mLs by mouth with breakfast, with lunch, and with evening meal.    [provider]  triamcinolone cream (KENALOG) 0.1 % Apply 1 Application topically 2 (two) times daily as needed (scalp lesion). Patient not taking: Reported on 08/21/2024    [provider]    Allergies: Colestipol, Ezetimibe, Hydrocodone -acetaminophen , Lisinopril, Lotensin  [benazepril  hcl], Lovastatin, Penicillins, and Simvastatin    Review of Systems  Updated Vital Signs BP (!) 100/53   Pulse 73   Temp (!) 97.5 F (36.4 C) (Oral)    Resp 15   SpO2 97%   Physical Exam Vitals and nursing note reviewed.  Constitutional:      General: He is not in acute distress.    Appearance: He is well-developed.  HENT:     Head: Normocephalic and atraumatic.     Mouth/Throat:     Mouth: Mucous membranes are moist.  Eyes:     General: Vision grossly intact. Gaze aligned appropriately.     Extraocular Movements: Extraocular movements intact.     Conjunctiva/sclera: Conjunctivae normal.  Cardiovascular:     Rate and Rhythm: Normal rate and regular rhythm.     Pulses: Normal pulses.     Heart sounds: Normal heart sounds, S1 normal and S2 normal. No murmur heard.    No friction rub. No gallop.  Pulmonary:     Effort: Pulmonary effort is normal. No respiratory distress.     Breath sounds: Normal breath sounds.  Abdominal:     Palpations: Abdomen is soft.     Tenderness: There is abdominal tenderness. There is no guarding or rebound.     Hernia: No hernia is present.  Musculoskeletal:        General: No swelling.     Cervical back: Full passive range of motion without pain, normal range of motion and neck supple. No pain with movement, spinous process tenderness or muscular tenderness. Normal range of motion.     Right lower leg: No edema.     Left lower leg: No edema.  Skin:    General: Skin is warm and dry.     Capillary Refill: Capillary refill takes less than 2 seconds.     Findings: No ecchymosis, erythema, lesion or wound.  Neurological:     Mental Status: He is alert and oriented to person, place, and time. Mental status is at baseline.     GCS: GCS eye subscore is 4. GCS verbal subscore is 5. GCS motor subscore is 6.     Cranial Nerves: Cranial nerves 2-12 are intact.     Sensory: Sensation is intact.     Motor: Motor function is intact. No weakness or abnormal muscle tone.     Coordination: Coordination is intact.     Comments: Altered speech secondary to prior stroke  Psychiatric:        Mood and Affect: Mood  normal.        Speech: Speech normal.        Behavior: Behavior normal.     (all labs ordered are listed, but only abnormal results are displayed) Labs Reviewed  CBC WITH DIFFERENTIAL/PLATELET - Abnormal; Notable for the following components:      Result Value   RBC 4.18 (*)    Hemoglobin 12.7 (*)    HCT 38.7 (*)    All other components within normal limits  COMPREHENSIVE METABOLIC PANEL WITH GFR - Abnormal; Notable for the following components:   Glucose, Bld 157 (*)  BUN 31 (*)    Creatinine, Ser 1.49 (*)    Calcium 8.5 (*)    Total Protein 6.3 (*)    Albumin 3.4 (*)    GFR, Estimated 45 (*)    All other components within normal limits  LIPASE, BLOOD - Abnormal; Notable for the following components:   Lipase 68 (*)    All other components within normal limits  HEMOGLOBIN AND HEMATOCRIT, BLOOD - Abnormal; Notable for the following components:   Hemoglobin 11.9 (*)    HCT 35.7 (*)    All other components within normal limits  POC OCCULT BLOOD, ED - Abnormal; Notable for the following components:   Fecal Occult Bld POSITIVE (*)    All other components within normal limits  PROTIME-INR  I-STAT CG4 LACTIC ACID, ED  TYPE AND SCREEN    EKG: EKG Interpretation Date/Time:  Monday August 21 2024 02:13:30 EST Ventricular Rate:  76 PR Interval:  209 QRS Duration:  143 QT Interval:  503 QTC Calculation: 566 R Axis:   -80  Text Interpretation: Sinus rhythm RBBB and LAFB Confirmed by Haze Lonni PARAS [phone]) on 08/21/2024 2:54:08 AM  Radiology: CT ANGIO GI BLEED Result Date: 08/21/2024 EXAM: CTA ABDOMEN AND PELVIS WITH CONTRAST 08/21/2024 05:57:07 AM TECHNIQUE: CTA images of the abdomen and pelvis with intravenous contrast. Three-dimensional MIP/volume rendered formations were performed. Automated exposure control, iterative reconstruction, and/or weight based adjustment of the mA/kV was utilized to reduce the radiation dose to as low as reasonably achievable.  COMPARISON: CT abdomen and pelvis with IV contrast 04/20/2023 and 12/09/2022. CLINICAL HISTORY: acute GI bleed, h/o diverticular bleed with IR intervention FINDINGS: VASCULATURE: GI BLEED: No active extravasation of contrast within the GI tract. AORTA: Heavy calcification in the abdominal aorta, particularly the infrarenal segment. No aneurysm, stenosis, dissection, or penetrating ulcer. CELIAC TRUNK: Calcifications inferiorly in the proximal celiac artery. No more than 40% proximal celiac artery stenosis. The vessel is otherwise widely patent. SUPERIOR MESENTERIC ARTERY: Moderate to heavy calcific plaques up to the vessel inflection point. No more than 40% irregular stenosis and otherwise patent. INFERIOR MESENTERIC ARTERY: No acute finding. No occlusion or significant stenosis. RENAL ARTERIES: Calcific plaques at both renal artery origins. Both vessels are widely patent without hilar branch occlusion. ILIAC ARTERIES: Bilateral iliac arteries are heavily calcified. The common iliac and external iliac arteries exhibit no flow-limiting stenosis. Moderate to severe irregular stenosis in the right internal iliac artery. Calcific occlusion of the left internal iliac artery which reconstitutes in the dorsal left pelvic sidewall. No occlusive disease of the proximal outflow arteries. ABDOMEN/PELVIS: LOWER CHEST: Posterior atelectasis in the lung bases without acute process. Moderate bilateral gynecomastia. The cardiac size is normal. There are left main and 2 vessel coronary calcifications in the left anterior descending and circumflex, greatest in the left anterior descending. LIVER: Multiple hepatic cysts, the largest is in the dome of segment 8 measuring 5.3 cm. No new abnormality is seen in the liver. GALLBLADDER AND BILE DUCTS: Gallbladder is unremarkable. No biliary ductal dilatation. SPLEEN: The spleen is unremarkable. PANCREAS: The pancreas is unremarkable. ADRENAL GLANDS: No adrenal mass. KIDNEYS, URETERS AND  BLADDER: Occasional few bilateral Bosniak 2 subcentimeter cortical cysts which are too small to characterize. No follow-up imaging is recommended. No stones in the kidneys or ureters. No hydronephrosis. No perinephric or periureteral stranding. There is a 2.4 cm right posterolateral bladder wall diverticulum, unchanged. The bladder is otherwise unremarkable. GI AND BOWEL: Thickened folds in the stomach, most likely due to gastritis  or peptic ulcer disease. No inflammatory changes are seen. There is no small bowel obstruction. The appendix is normal. There is a large amount of retained stool, particularly in the rectosigmoid segment. There is mild wall thickening in the rectum with perirectal stranding compatible with stercoral proctitis. REPRODUCTIVE: Reproductive organs are unremarkable. PERITONEUM AND RETROPERITONEUM: Trace reactive fluid in the dorsal deep pelvis but no free hemorrhage, free fluid, pneumatosis, or abscess. LYMPH NODES: No lymphadenopathy. BONES AND SOFT TISSUES: Osteopenia, mild dextroscoliosis and degenerative changes of the lumbar spine. No acute or significant osseous findings. Bilateral small inguinal hernias without incarceration. No acute soft tissue abnormality. IMPRESSION: 1. No active GI bleeding. 2. Mild rectal wall thickening with perirectal stranding compatible with stercoral proctitis. Large amount of retained stool. 3. 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 . 4. Coronary calcifications heaviest in the lad. Aortic and branch vessel atherosclerosis as described above. 5. Bilateral gynecomastia , diffuse fibroglandular type. Electronically signed by: Francis Quam MD 08/21/2024 06:34 AM EST RP Workstation: [id number]     Procedures   Medications Ordered in the ED   pantoprazole  (PROTONIX ) injection 80 mg (has no administration in time range)  morphine  (PF) 4 MG/ML injection 4 mg (4 mg Intravenous Given 08/21/24 0305)  ondansetron  (ZOFRAN ) injection 4 mg (4 mg Intravenous Given 08/21/24 0305)  sodium chloride  0.9 % bolus 500 mL (0 mLs Intravenous Stopped 08/21/24 0434)  iohexol  (OMNIPAQUE ) 350 MG/ML injection 100 mL (100 mLs Intravenous Contrast Given 08/21/24 0558)                                    Medical Decision Making Amount and/or Complexity of Data Reviewed External Data Reviewed: labs, radiology, ECG and notes. Labs: ordered. Decision-making details documented in ED Course. Radiology: ordered and independent interpretation performed. Decision-making details documented in ED Course. ECG/medicine tests: ordered and independent interpretation performed. Decision-making details documented in ED Course.  Risk Prescription drug management.   Differential Diagnosis considered includes, but not limited to: Cholelithiasis; cholecystitis; cholangitis; bowel obstruction; esophagitis; gastritis; peptic ulcer disease; pancreatitis; colitis  Presents to the emergency department for evaluation of rectal bleeding.  Patient reports feeling a need to have a bowel movement, noted dark red blood and clots in the toilet.  He arrives in the ED by ambulance, complaining of diffuse abdominal pain.  Patient had normal vital signs on arrival.  He did drop his blood pressure after administration of morphine , given IV fluids with improvement.  Patient's hemoglobin appears to be at baseline.  CT angio bleeding study performed, patient with thickened gastric folds, gastritis versus peptic ulcer disease.  Additionally, patient with large amount of stool, stercoral colitis.  No active bleeding.  Rectal exam did reveal stool in the rectum but it was soft, claylike, not able to disimpact.  Will initiate antibiotics, admit patient for further management.  CRITICAL  CARE Performed by: Lonni JINNY Seats   Total critical care time: 30 minutes  Critical care time was exclusive of separately billable procedures and treating other patients.  Critical care was necessary to treat or prevent imminent or life-threatening deterioration.  Critical care was time spent personally by me on the following activities: development of treatment plan with patient and/or surrogate as well as nursing, discussions with consultants,  evaluation of patient's response to treatment, examination of patient, obtaining history from patient or surrogate, ordering and performing treatments and interventions, ordering and review of laboratory studies, ordering and review of radiographic studies, pulse oximetry and re-evaluation of patient's condition.       Final diagnoses:  Acute GI bleeding  Stercoral colitis    ED Discharge Orders     None          Haze Lonni PARAS, MD 08/21/24 [phone]  "

## 2024-08-21 NOTE — ED Notes (Signed)
 Patient returned from CT

## 2024-08-21 NOTE — ED Triage Notes (Addendum)
 Patient arrives vis EMS. Patient was having a bowel movement this morning and noticed blood in his stool with some clots. States this happened. Patient is from [facility].

## 2024-08-21 NOTE — Evaluation (Signed)
 Occupational Therapy Evaluation Patient Details Name: Bill Jackson MRN: [id number] DOB: Oct 06, 1934 Today's Date: 08/21/2024   History of Present Illness   89 y.o. with acute on chronic constipation, lower GI bleeding, stercoral colitis. PMH: fall (2024) with 8th rib fx; significant of diabetes, stroke (residual rt sided weakness) syncope, hypertension, low back pain, glaucoma, neuropathy, GERD, history of gastrostomy     Clinical Impressions Pt from [facility], where he has been a resident for 2 years. At baseline, pt stand pivots into his wc with S of staff and requires assistance for ADL tasks. Pt self propels his wc with his feet. Niece present during session and agrees pt is at his functional baseline. No further need for acute OT. Recommend staff mobilize pt OOB daily.      If plan is discharge home, recommend the following:         Functional Status Assessment   Patient has not had a recent decline in their functional status     Equipment Recommendations   None recommended by OT     Recommendations for Other Services         Precautions/Restrictions   Precautions Precautions: Fall     Mobility Bed Mobility Overal bed mobility: Needs Assistance Bed Mobility: Supine to Sit     Supine to sit: Contact guard          Transfers Overall transfer level: Needs assistance   Transfers: Sit to/from Stand, Bed to chair/wheelchair/BSC   Stand pivot transfers: Contact guard assist                Balance                                           ADL either performed or assessed with clinical judgement   ADL Overall ADL's : Needs assistance/impaired;At baseline                                             Vision Baseline Vision/History: 3 Glaucoma       Perception         Praxis         Pertinent Vitals/Pain Pain Assessment Pain Assessment: Faces Faces Pain Scale: Hurts a little bit Pain  Location: B feet Pain Descriptors / Indicators: Burning Pain Intervention(s): Limited activity within patient's tolerance     Extremity/Trunk Assessment Upper Extremity Assessment Upper Extremity Assessment: Generalized weakness (limited B shoulder ROM - at baseline)   Lower Extremity Assessment Lower Extremity Assessment: Generalized weakness   Cervical / Trunk Assessment Cervical / Trunk Assessment: Normal   Communication Communication Communication: Impaired Factors Affecting Communication: Reduced clarity of speech;Hearing impaired   Cognition Arousal: Alert Behavior During Therapy: WFL for tasks assessed/performed (agitated at times) Cognition: History of cognitive impairments (at baseline per niece)                               Following commands: Intact       Cueing  General Comments          Exercises     Shoulder Instructions      Home Living Family/patient expects to be discharged to:: Skilled nursing facility  Prior Functioning/Environment Prior Level of Function : Needs assist       Physical Assist : Mobility (physical);ADLs (physical) Mobility (physical): Transfers ADLs (physical): Bathing;Dressing;Toileting;IADLs Mobility Comments: stand pivots into his wc with staff supervising; self propels wc with feet ADLs Comments: staff assist with bathing, dressing and clothing management for toileting    OT Problem List: Decreased strength   OT Treatment/Interventions:        OT Goals(Current goals can be found in the care plan section)   Acute Rehab OT Goals Patient Stated Goal: get something to eat OT Goal Formulation: All assessment and education complete, DC therapy   OT Frequency:       Co-evaluation              AM-PAC OT 6 Clicks Daily Activity     Outcome Measure Help from another person eating meals?: None Help from another person taking care of personal  grooming?: A Little Help from another person toileting, which includes using toliet, bedpan, or urinal?: A Lot Help from another person bathing (including washing, rinsing, drying)?: A Lot Help from another person to put on and taking off regular upper body clothing?: A Little Help from another person to put on and taking off regular lower body clothing?: A Lot 6 Click Score: 16   End of Session Equipment Utilized During Treatment: Gait belt Nurse Communication: Mobility status  Activity Tolerance: Patient tolerated treatment well Patient left: in chair;with call bell/phone within reach;with chair alarm set;with family/visitor present  OT Visit Diagnosis: Unsteadiness on feet (R26.81)                Time: 8352-8292 OT Time Calculation (min): 20 min Charges:  OT General Charges $OT Visit: 1 Visit OT Evaluation $OT Eval Low Complexity: 1 Low  Kreg Sink, OT/L   Acute OT Clinical Specialist Acute Rehabilitation Services Pager [phone] Office [phone]   [facility] 08/21/2024, 5:21 PM

## 2024-08-21 NOTE — Assessment & Plan Note (Addendum)
-   Admit to [facility] med-telemetry floor , Attending Dr. Donzetta  [facility] consulted, appreciate recommendations  - Protonix  40 mg BID  - NPO for possible scope, if not doing a scope will add a bowel regimen once passes bedside swallow given large stool burden  - Holding off on IVF while NPO given HFrEF.  - H&H at 1pm today  - Tylenol  q 6 hours scheduled  - dilaudid  0.5 mg q4hrs prn

## 2024-08-21 NOTE — ED Provider Notes (Signed)
 Blood pressure (!) 100/53, pulse 73, temperature 97.6 F (36.4 C), temperature source Oral, resp. rate 15, SpO2 97%.  Assuming care from Dr. Haze.  In short, Bill Jackson is a 89 y.o. male with a chief complaint of GI Bleeding .  Refer to the original H&P for additional details.  The current plan of care is to admit patient.  [facility] consulted and rounding team will see this AM.   08:51 AM  Discussed patient's case with [facility]am [facility] to request admission. Patient and family (if present) updated with plan.   I reviewed all nursing notes, vitals, pertinent old records, EKGs, labs, imaging (as available).    Darra Fonda MATSU, MD 08/21/24 [phone]

## 2024-08-21 NOTE — ED Notes (Signed)
 PA Groce was notified that patient's blood pressure was hypotensive. Placed order for 500ml bolus of normal saline. As this nurse was starting bolus, EDP Pollina, MD assessed patient stating to give the fluids some time to elevate his pressure and then go to CT. Will continue to monitor.

## 2024-08-21 NOTE — Assessment & Plan Note (Addendum)
-   H&H at 1pm  - transfusion threshold <  8 given CAD  - ferrous sulfate

## 2024-08-21 NOTE — H&P (Addendum)
 "    Hospital Admission History and Physical Service Pager: [phone]  Patient name: Bill Jackson Medical record number: [id number] Date of Birth: 1935/06/18 Age: 89 y.o. Gender: male  Primary Care Provider: Arlean Blunt, MD  Consultants: Bill Jackson  Code Status: Full code- default (previously DNR, but unable to confirm with patient and family/facility unavailable to confirm code status) Preferred Emergency Contact:  Contact Information     Name Relation Home Work Mobile   Bill Jackson Other [phone]     Bill Jackson Sister [phone]     Bill Jackson [phone]        Other Contacts   None on File    Was previously at facility [facility]   Chief Complaint: Melanotic stool   Differential and Medical Decision Making:  Bill Jackson is a 89 y.o. male presenting with Jackson bleed.  Differential for this patient's presentation of this includes upper Jackson bleed, lower Jackson bleed, Jackson cancer. Less likely Jackson cancer given no mass found on CT angio study. Ischemic colitis possible given moderate to severe right internal iliac artery stenosis; however, no occlusion and not hemodynamically stable on the CT angio bleed study. Less likely upper Jackson bleed given slow drop in hemoglobin and hemodynamic stability. Most likely lower Jackson bleed given previous history of diverticular bleed and stercoral proctitis on imaging.   At this time hemodynamically stable. On antibiotics for possible diverticulitis like picture.   Patient does have some confusion/delirium though also has some dysarthria given h/o CVA. Similar to history from chart review, but will continue to try to contact family and facility for history.  Assessment & Plan Jackson bleed - Admit to FMTS med-telemetry floor , Attending Dr. Donzetta  Bill Jackson Bill Jackson consulted, appreciate recommendations  - Protonix  40 mg BID  - NPO for possible scope, if not doing a scope will add a bowel regimen once passes bedside swallow given large stool  burden  - Holding off on IVF while NPO given HFrEF.  - H&H at 1pm today  - Tylenol  q 6 hours scheduled  - dilaudid  0.5 mg q4hrs prn  Anemia - H&H at 1pm  - transfusion threshold <  8 given CAD  - ferrous sulfate   Stercoral colitis - Continue ceftriaxone  and flagyl .  - [place] [facility] consulted, possible lower scope  - Tylenol  q6 scheduled, dilaudid  0.5 mg q4hrs prn   QT prolongation Qtc 566.  - Hold off on antiemetics at this time  - Check magnesium  level    Chronic and Stable Conditions: PAF - Continue amiodarone  T2DM - Holding home lantus  given NPO, SSI sensitive q4hrs  HTN - not on any medications outpatient.  FEN/Jackson: NPO until evaluation by Jackson in case of scope VTE Prophylaxis: holding in setting of possible Jackson bleed   Disposition: Admit to med-tele   History of Present Illness:  Bill Jackson is a 89 y.o. male presenting with melena last night and symptomatic anemia.    Patient had multiple bowel movements with dark red blood and clots in the toilet last night along with diffuse abdominal pain. He was hemodynamically stable on arrival to the ED.  In the ED he was given IVF and morphine , after which he had a slight drop in BP. Hemoglobin was initially slightly below baseline at 12.7 and then dropped slightly to 11.9 4 hours later. Fecal occult blood was positive.  Ct angio bleeding study showed thickened gastric folds with stercoral colitis and increased stool burden but no active bleeding.   ED  physical consulted Bill Jackson in the ED. They have added patient to list of patients to see today.   Patient has a history of paroxysmal atrial fibrillation for which ihe is on amiodarone . He has had history of cerebellar hemorrhagic stroke, thus no longer on anticoagulation.   Patient had history of admission for lower Jackson bleed in 04/2023 during which he received 1 unit pRBC, flexible sigmoidoscopy consistent with diverticular bleed that had stopped.   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   Pertinent Past Surgical History: PCI  Craniectomy  Hernia repair  Rotator cuff repair   Remainder reviewed in history tab.  Pertinent Social History: Tobacco use: former smoker Alcohol  use: none  Other Substance use: none Lives at [facility].   Pertinent Family History: None pertinent   Important Outpatient Medications: Amiodarone  Lantus   Claritin    Objective: BP 100/61   Pulse 78   Temp 97.6 F (36.4 C) (Oral)   Resp 17   SpO2 99%  Exam: General: no acute distress, dysarthric, hard of hearing  Eyes: no scleral icterus  ENTM: slightly dry mucous membranes Cardiovascular: Regular rate, irregularly irregular rhythm, II/VI systolic murmur at left sternal border Respiratory: Normal work of breathing on room air, CTAB Gastrointestinal: soft, non distended, diffusely tender to palpation no guarding  Neuro: Alert, oriented x 2, unable to converse reliably due to dysarthria  Labs:  CBC BMET  Recent Labs  Lab 08/21/24 0215 08/21/24 0642  WBC 6.2  --   HGB 12.7* 11.9*  HCT 38.7* 35.7*  PLT 298  --    Recent Labs  Lab 08/21/24 0215  NA 141  K 4.0  CL 104  CO2 26  BUN 31*  CREATININE 1.49*  GLUCOSE 157*  CALCIUM 8.5*    Pertinent additional labs lactic acid wnl.  Lipase 68, PT/INR wnl.   EKG: ventricular rate 76, prolonged Qtc 566, RBBB, and LAFB    Imaging Studies Performed:  CT Angio Jackson bleed (CTA abdomen and Pelvis with Contrast)  1. No active Jackson bleeding. 2. Mild rectal wall thickening with perirectal stranding compatible with stercoral proctitis. Large amount of retained stool. 3. No occlusion or hemodynamically significant stenosis of the arterial  system of the abdomen or pelvis, with moderate to severe right internal iliac artery stenosis and calcific occlusion of the left internal iliac artery with distal reconstitution. . 4. Coronary calcifications heaviest in the lad. Aortic and branch vessel atherosclerosis as described above. 5. Bilateral gynecomastia , diffuse fibroglandular type.   Nicholas Bar, MD 08/21/2024, 8:50 AM PGY-3, [facility]  FPTS Intern pager: [phone], text pages welcome Secure chat group [facility] Teaching Service     "

## 2024-08-21 NOTE — Hospital Course (Addendum)
 Bill Jackson is a 89 y.o.male with a history of cerebellar hemorrhagic stroke, PAF on amiodarone , prior GI bleed 2024 who was admitted to the [facility] Medicine Teaching Service at [facility] for GI bleed in the setting of stercoral colitis. His hospital course is detailed below:  GI Bleed  Anemia Patient presented to ED from long-term facility due to multiple bowel movements with dark red blood and clots in addition to diffuse abdominal pain.  He was hemodynamically stable on arrival, though found to be anemic to 11.9.  CT showed thickened gastric folds with stercoral colitis and increased stool burden but no active bleeding.  GI was consulted and suspicious for lower GI bleeding related to stercoral ulcer from constipation.  As such bowel regimen with MiraLAX  twice daily and Dulcolax daily was initiated.  He did receive 1 dose each of CTX and flagyl  in the ED but this did not need to be continued in the setting of stercoral ulcer bleed.  Upon discharge patient's hemoglobin stabilized.  He was continued on hydrocortisone  suppositories due to concern for hemorrhoidal bleed.  Endoscopic evaluation was not performed.  Patient to follow-up outpatient with [facility].  Patient was recommended by SLP to have Dysphagia 1 diet.   Other chronic conditions were medically managed with home medications and formulary alternatives as necessary (anemia, CAD, HFrEF, T2DM, HTN, CKD Stage 3a)  PCP Follow-up Recommendations: Continue bowel regimen and adjust as needed. Should follow up with [facility] (primarily sees Dr. Stacia) - in 3-4 weeks after discharge Recheck Hgb and creatinine at PCP follow up   Pertinent imaging:  CT Angio GI Bleed IMPRESSION: 1. No active GI bleeding. 2. Mild rectal wall thickening with perirectal stranding compatible with stercoral proctitis. Large amount of retained stool. 3. No occlusion or hemodynamically significant stenosis of the arterial system of the abdomen or pelvis, with  moderate to severe right internal iliac artery stenosis and calcific occlusion of the left internal iliac artery with distal reconstitution. . 4. Coronary calcifications heaviest in the lad. Aortic and branch vessel atherosclerosis as described above. 5. Bilateral gynecomastia , diffuse fibroglandular type.   Electronically signed by: Francis Quam MD 08/21/2024 06:34 AM EST RP Workstation: [id number]   DG abd 1 view 1/20 and 1/21: IMPRESSION: No evidence of bowel obstruction. Mildly improved rectal stool burden.     Electronically Signed   By: Elsie Perone M.D.   On: 08/22/2024 08:49  IMPRESSION: 1. Diminished colonic stool burden from yesterday's exam with small to moderate residual. 2. Question gaseous gastric distension.     Electronically Signed   By: Andrea Gasman M.D.   On: 08/23/2024 13:38   DG Swallow Func SLP Clinical Impression: Pt presents with a likely chronic dysphagia but with increase for adverse events during acute hospital stay. Change to Dys 1/Moderately thick liquids as pt displayed overt aspiration of thinner liquids. Pt stated he preferred pureed over minced diet. SLP f/u as able to determine if PO texture upgrade is appropriate, although may be best done at next level of care.

## 2024-08-21 NOTE — Assessment & Plan Note (Signed)
 Qtc 566.  - Hold off on antiemetics at this time  - Check magnesium  level

## 2024-08-21 NOTE — ED Triage Notes (Signed)
 Pt BIB [facility] from [facility] d/t GI bleed - pt has dark red clots.

## 2024-08-21 NOTE — Assessment & Plan Note (Addendum)
-   Continue ceftriaxone  and flagyl .  - [facility] consulted, possible lower scope  - Tylenol  q6 scheduled, dilaudid  0.5 mg q4hrs prn

## 2024-08-21 NOTE — Plan of Care (Addendum)
 FMTS Interim Progress Note  S:Ms. Obar is 89 YO male admitted for GI bleed. GI was consulted recommend KUB tmrw and purge bowels today follow by steroid supp BID   O: BP 117/78   Pulse 66   Temp 98.3 F (36.8 C) (Oral)   Resp 18   Ht 6' (1.829 m)   Wt 77.5 kg   SpO2 100%   BMI 23.17 kg/m   Physical Exam Constitutional:      Appearance: Normal appearance.  Cardiovascular:     Rate and Rhythm: Normal rate.     Pulses: Normal pulses.  Pulmonary:     Effort: Pulmonary effort is normal.  Abdominal:     Palpations: Abdomen is soft.  Neurological:     Mental Status: He is alert.      A/P: No distress during round. H/H stable. Pt was complaining about getting an enema but denies pain  Suzen Houston NOVAK, DO 08/21/2024, 7:51 PM PGY-1, [facility] Family Medicine Service pager [phone]

## 2024-08-22 ENCOUNTER — Observation Stay (HOSPITAL_COMMUNITY)

## 2024-08-22 DIAGNOSIS — K626 Ulcer of anus and rectum: Secondary | ICD-10-CM | POA: Diagnosis not present

## 2024-08-22 DIAGNOSIS — K5900 Constipation, unspecified: Secondary | ICD-10-CM | POA: Diagnosis not present

## 2024-08-22 DIAGNOSIS — K922 Gastrointestinal hemorrhage, unspecified: Secondary | ICD-10-CM | POA: Diagnosis not present

## 2024-08-22 DIAGNOSIS — R1310 Dysphagia, unspecified: Secondary | ICD-10-CM | POA: Diagnosis not present

## 2024-08-22 DIAGNOSIS — D62 Acute posthemorrhagic anemia: Secondary | ICD-10-CM | POA: Diagnosis not present

## 2024-08-22 LAB — BASIC METABOLIC PANEL WITH GFR
Anion gap: 11 (ref 5–15)
BUN: 37 mg/dL — ABNORMAL HIGH (ref 8–23)
CO2: 20 mmol/L — ABNORMAL LOW (ref 22–32)
Calcium: 7.7 mg/dL — ABNORMAL LOW (ref 8.9–10.3)
Chloride: 108 mmol/L (ref 98–111)
Creatinine, Ser: 1.64 mg/dL — ABNORMAL HIGH (ref 0.61–1.24)
GFR, Estimated: 40 mL/min — ABNORMAL LOW
Glucose, Bld: 127 mg/dL — ABNORMAL HIGH (ref 70–99)
Potassium: 4.1 mmol/L (ref 3.5–5.1)
Sodium: 139 mmol/L (ref 135–145)

## 2024-08-22 LAB — GLUCOSE, CAPILLARY
Glucose-Capillary: 104 mg/dL — ABNORMAL HIGH (ref 70–99)
Glucose-Capillary: 114 mg/dL — ABNORMAL HIGH (ref 70–99)
Glucose-Capillary: 123 mg/dL — ABNORMAL HIGH (ref 70–99)
Glucose-Capillary: 126 mg/dL — ABNORMAL HIGH (ref 70–99)
Glucose-Capillary: 179 mg/dL — ABNORMAL HIGH (ref 70–99)
Glucose-Capillary: 242 mg/dL — ABNORMAL HIGH (ref 70–99)
Glucose-Capillary: 263 mg/dL — ABNORMAL HIGH (ref 70–99)

## 2024-08-22 LAB — CBC
HCT: 32.3 % — ABNORMAL LOW (ref 39.0–52.0)
Hemoglobin: 10.4 g/dL — ABNORMAL LOW (ref 13.0–17.0)
MCH: 30 pg (ref 26.0–34.0)
MCHC: 32.2 g/dL (ref 30.0–36.0)
MCV: 93.1 fL (ref 80.0–100.0)
Platelets: 229 K/uL (ref 150–400)
RBC: 3.47 MIL/uL — ABNORMAL LOW (ref 4.22–5.81)
RDW: 13.7 % (ref 11.5–15.5)
WBC: 5.7 K/uL (ref 4.0–10.5)
nRBC: 0 % (ref 0.0–0.2)

## 2024-08-22 LAB — HEMOGLOBIN AND HEMATOCRIT, BLOOD
HCT: 31.2 % — ABNORMAL LOW (ref 39.0–52.0)
Hemoglobin: 10.3 g/dL — ABNORMAL LOW (ref 13.0–17.0)

## 2024-08-22 LAB — MAGNESIUM: Magnesium: 2 mg/dL (ref 1.7–2.4)

## 2024-08-22 MED ORDER — ACETAMINOPHEN 325 MG PO TABS: 650.0000 mg | ORAL_TABLET | Freq: Four times a day (QID) | ORAL | Status: DC | PRN

## 2024-08-22 MED ORDER — PANTOPRAZOLE SODIUM 40 MG PO TBEC
40.0000 mg | DELAYED_RELEASE_TABLET | Freq: Every day | ORAL | Status: DC
  Administered 2024-08-23 – 2024-08-24 (×2): 40 mg via ORAL
  Filled 2024-08-22 (×2): qty 1

## 2024-08-22 NOTE — Assessment & Plan Note (Addendum)
 Stable. Almost 1 point loss overnight most likely dilutional from mIVF administration.  - Transfusion threshold <8 given CAD  - Continue home ferrous sulfate 

## 2024-08-22 NOTE — Assessment & Plan Note (Addendum)
 Stable. 1 non-bloody medium sized BM overnight.  [facility] consulted, appreciate recommendations   - No need for abx at this time, patient is not a good candidate for scope - Continue Protonix  40 mg BID  - Need to purge bowels of stool with MiraLax  and Dulcolax before treating inflammation with Anusol  suppositories - Awaiting SLP recommendations, received NS mIVF overnight since NPO  - MBS done today, will provide diet when completed - Pain control with Tylenol  q6h scheduled, consider oxycodone  5mg  q6h for increased pain

## 2024-08-22 NOTE — Assessment & Plan Note (Addendum)
 Qtc 566 on 01/19 EKG. - Hold off on antiemetics at this time  - Magnesium  levels WNL

## 2024-08-22 NOTE — Evaluation (Signed)
 Clinical/Bedside Swallow Evaluation [name] MRN: [id number] Date of Birth: 1935/04/16  Today's Date: 08/22/2024 Time: SLP Start Time (ACUTE ONLY): 0910 SLP Stop Time (ACUTE ONLY): 0946 SLP Time Calculation (min) (ACUTE ONLY): 36 min  Past Medical History:  Past Medical History:  Diagnosis Date   Diabetes mellitus    Dysphagia following cerebrovascular accident    PEG tube   Hemorrhagic stroke (HCC) 12/10/2022   Hyperlipidemia    Hypertension    Left shoulder pain    Right knee pain    Stroke (HCC) 10/01/2012   R side weakness, slurred speech, dizziness, visual changes, ataxia; hemorrhagic cerebellar CVA, evacuated   Past Surgical History:  Past Surgical History:  Procedure Laterality Date   CARDIAC SURGERY     CRANIECTOMY N/A 10/25/2012   Procedure: Suboccipital Craniectomy for Evacuation of Cerebellar Hematoma;  Surgeon: Fairy Levels, MD;  Location: [facility];  Service: Neurosurgery;  Laterality: N/A;  Suboccipital Craniectomy for Evacuation of Cerebellar Hematoma   FLEXIBLE SIGMOIDOSCOPY N/A 04/22/2023   Procedure: FLEXIBLE SIGMOIDOSCOPY;  Surgeon: Stacia Glendia BRAVO, MD;  Location: [facility] ENDOSCOPY;  Service: Gastroenterology;  Laterality: N/A;   heart stents  x3   HERNIA REPAIR     PEG TUBE PLACEMENT     placed ?2014, changed 01/2014 (at [facility])   ROTATOR CUFF REPAIR     HPI:  89 y.o. with acute on chronic constipation, lower GI bleeding, stercoral colitis. PMH: henorrhagic CVA w/ R hemiplegia (10/2012), prior requirement for PEG, PNA (2022/2023), GERD, fall (2024) with 8th rib fx, diabetes, syncope, HTN, low back pain, glaucoma, neuropathy, history of gastrostomy. Previous MBS (12/2022) with sensed moderately significant aspiration events of nectar and thin liquids. Dys 2/honey thick recommended. Pt is an unreliable historian.    Assessment / Plan / Recommendation  Clinical Impression  PLAN: Family wishes for MBS to evaluate swallow and pt cleared by  GI to completed. Will f/u after completion for further recommendations, as family is interested in short-term diet modifications if pt continues to overtly aspirate, as he has previously.   Pt states that he consumes thin liquids and chopped foods (1-in pieces) at SNF, however Olam (daughter) states food is closer to minced texture. Pt reports globus sensation with retrograde flow when food is not small enough. Pt consumed multiple sips of thin liquid via cup edge and straw with immediate cough following all sips. Pt displayed intermittent immediate and delayed throat clearing during trials of thin. Pt consumed applesauce and honey-thick liquids without coughing or throat clearing and w/o complaints of globus sensation.     SLP Visit Diagnosis: Dysphagia, unspecified (R13.10)    Aspiration Risk  Moderate aspiration risk;Risk for inadequate nutrition/hydration    Diet Recommendation           Other Recommendations Oral Care Recommendations: Oral care QID     Swallow Evaluation Recommendations Recommendations: NPO   Assistance Recommended at Discharge    Functional Status Assessment Patient has had a recent decline in their functional status and demonstrates the ability to make significant improvements in function in a reasonable and predictable amount of time.  Frequency and Duration min 2x/week  2 weeks       Prognosis Prognosis for improved oropharyngeal function: Fair Barriers to Reach Goals: Cognitive deficits;Time post onset;Severity of deficits      Swallow Study   General Date of Onset:  (post-CVA (2014)) HPI: 89 y.o. with acute on chronic constipation, lower GI bleeding, stercoral colitis. PMH: henorrhagic CVA  w/ R hemiplegia (10/2012), prior requirement for PEG, PNA (2022/2023), GERD, fall (2024) with 8th rib fx, diabetes, syncope, HTN, low back pain, glaucoma, neuropathy, history of gastrostomy. Previous MBS (12/2022) with sensed moderately significant aspiration events of  nectar and thin liquids. Dys 2/honey thick recommended. Pt is an unreliable historian. Type of Study: Bedside Swallow Evaluation Previous Swallow Assessment: MBS (MBS in 06/2013, 12/2022. MBS ordered 08/2023 however order expired.) Diet Prior to this Study: NPO Temperature Spikes Noted: No Respiratory Status: Room air History of Recent Intubation: No Behavior/Cognition: Alert;Cooperative Oral Cavity Assessment: Within Functional Limits Oral Care Completed by SLP: Yes (expectorated bloody secretions after) Oral Cavity - Dentition: Missing dentition;Other (Comment);Dentures, not available (denture adhesive not available; dentures not placed) Vision: Functional for self-feeding Self-Feeding Abilities: Needs set up Patient Positioning: Upright in bed Baseline Vocal Quality: Normal Volitional Cough: Wet Volitional Swallow: Able to elicit    Oral/Motor/Sensory Function Overall Oral Motor/Sensory Function: Within functional limits   Ice Chips Ice chips: Not tested   Thin Liquid Thin Liquid: Impaired Presentation: Cup;Straw;Self Fed Pharyngeal  Phase Impairments: Throat Clearing - Immediate;Throat Clearing - Delayed;Cough - Immediate;Multiple swallows    Nectar Thick     Honey Thick Honey Thick Liquid: Within functional limits Presentation: Cup;Straw;Self fed   Puree Puree: Within functional limits Presentation: Self Fed;Spoon   Solid     Solid: Not tested      Rocky Quan, Student SLP  08/22/2024,10:26 AM

## 2024-08-22 NOTE — Progress Notes (Signed)
 PT Cancellation Note  Patient Details Name: Bill Jackson MRN: [id number] DOB: [birth year]   Cancelled Treatment:    Reason Eval/Treat Not Completed: PT screened, no needs identified, will sign off (Per OT, pt at functional baseline of stand pivot transfer with supervision. He is a LTC resident at [facility]).  Bill Jackson, PT, DPT Acute Rehabilitation Services Office [phone]    Bill Jackson 08/22/2024, 4:04 PM

## 2024-08-22 NOTE — Evaluation (Signed)
 Modified Barium Swallow Study  [name] MRN: [id number] Date of Birth: [birth year]  Today's Date: 08/22/2024  Modified Barium Swallow completed.  Full report located under Chart Review in the Imaging Section.  History of Present Illness 89 y.o. with acute on chronic constipation, lower GI bleeding, stercoral colitis. PMH: henorrhagic CVA w/ R hemiplegia (10/2012), prior requirement for PEG, PNA (2022/2023), GERD, fall (2024) with 8th rib fx, diabetes, syncope, HTN, low back pain, glaucoma, neuropathy, history of gastrostomy. Previous MBS (12/2022) with sensed moderately significant aspiration events of nectar and thin liquids. Dys 2/honey thick recommended. Pt is an unreliable historian.   Clinical Impression  Pt presents with a likely chronic dysphagia but with increase for adverse events during acute hospital stay. Change to Dys 1/Moderately thick liquids as pt displayed overt aspiration of thinner liquids. Pt stated he preferred pureed over minced diet. SLP f/u as able to determine if PO texture upgrade is appropriate, although may be best done at next level of care.   Pt displayed significant aspiration of thin and mildly-thick liquids. Pt had incomplete laryngeal vestibule closure as well as incoordination of timing of pharyngeal swallow and airway closure. Compensatory techniques of chin tuck and R head turn increased amount of aspiration. Airway protection improved with honey thick liquids and solids when pt had more time for coordination. Pt displayed moderate pharyngeal residue with puree texture, but this cleared across subsequent swallows. Pt displayed inefficiencies of oral components (i.e. escape to lateral buccal cavity/floor of mouth, oral residue), however oral swallow remains functional for softer diet.   Factors that may increase risk of adverse event in presence of aspiration Noe & Lianne 2021): Respiratory or GI disease;Reduced cognitive  function;Inadequate oral hygiene;Limited mobility;Weak cough  Swallow Evaluation Recommendations Recommendations: PO diet PO Diet Recommendation: Dysphagia 1 (Pureed);Moderately thick liquids (Level 3, honey thick) Liquid Administration via: Cup;Straw Medication Administration: Crushed with puree (Pt stated during MBS he would not take pills if they were not crushed) Supervision: Staff to assist with self-feeding Swallowing strategies  : Slow rate;Small bites/sips Postural changes: Position pt fully upright for meals;Stay upright 30-60 min after meals Oral care recommendations: Oral care BID (2x/day) Caregiver Recommendations: Remove water  pitcher;Avoid jello, ice cream, thin soups, popsicles      Rocky Quan, Student SLP  08/22/2024,4:30 PM

## 2024-08-22 NOTE — Progress Notes (Signed)
" ° ° ° °  Daily Progress Note Intern Pager: [phone]  Patient name: Bill Jackson Medical record number: [id number] Date of birth: 02/24/1935 Age: 89 y.o. Gender: male  Primary Care Provider: Marsa Arlean BIRCH, MD (Inactive) Consultants: GI Code Status: Full code- default (previously DNR, but unable to confirm with patient and family/facility unavailable to confirm code status)   Pt Overview and Major Events to Date:  01/19: Admitted for rectal bleeding in the setting of stercoral colitis  Assessment and Plan:  Bill Jackson is an 89 year old male with past medical history of PAF on amiodarone , cerebellar hemorrhagic stroke, lower GI bleed 2024, admitted for rectal bleeding in the setting of stercoral colitis. Assessment & Plan Rectal bleed Stercoral colitis Stable. 1 non-bloody medium sized BM overnight.  [facility] consulted, appreciate recommendations   - No need for abx at this time, patient is not a good candidate for scope - Continue Protonix  40 mg BID  - Need to purge bowels of stool with MiraLax  and Dulcolax before treating inflammation with Anusol  suppositories - Awaiting SLP recommendations, received NS mIVF overnight since NPO  - MBS done today, will provide diet when completed - Pain control with Tylenol  q6h scheduled, consider oxycodone  5mg  q6h for increased pain Anemia Stable. Almost 1 point loss overnight most likely dilutional from mIVF administration.  - Transfusion threshold <8 given CAD  - Continue home ferrous sulfate   QT prolongation Qtc 566 on 01/19 EKG. - Hold off on antiemetics at this time  - Magnesium  levels WNL  FEN/GI: N.p.o. until SLP eval PPx: SCDs Dispo:SNF pending clinical improvement .  Subjective:  Patient was seen and examined at bedside.  He has no complaints today, states his abdomen is fine.  He does report 1 bowel movement overnight.   Objective: Temp:  [97.8 F (36.6 C)-98.3 F (36.8 C)] 98.2 F (36.8 C) (01/20 0319) Pulse Rate:   [56-81] 56 (01/20 0319) Resp:  [14-18] 18 (01/20 0319) BP: (100-117)/(52-78) 101/52 (01/20 0319) SpO2:  [99 %-100 %] 100 % (01/20 0319) Weight:  [77.5 kg-82.1 kg] 82.1 kg (01/20 0414) Physical Exam: General: Lying supine comfortably in hospital bed in no acute distress Cardiovascular: Distant heart sounds, possible murmur Respiratory: Normal work of breathing on room air, CTAB, good respiratory effort Abdomen: Soft, diffusely tender, bowel sounds present Extremities: Moves all extremities equally, no peripheral edema  Laboratory: Most recent CBC Lab Results  Component Value Date   WBC 5.7 08/22/2024   HGB 10.4 (L) 08/22/2024   HCT 32.3 (L) 08/22/2024   MCV 93.1 08/22/2024   PLT 229 08/22/2024   Most recent BMP    Latest Ref Rng & Units 08/22/2024    4:25 AM  BMP  Glucose 70 - 99 mg/dL 872   BUN 8 - 23 mg/dL 37   Creatinine 9.38 - 1.24 mg/dL 8.35   Sodium 864 - 854 mmol/L 139   Potassium 3.5 - 5.1 mmol/L 4.1   Chloride 98 - 111 mmol/L 108   CO2 22 - 32 mmol/L 20   Calcium 8.9 - 10.3 mg/dL 7.7     Bill Credit, DO 08/22/2024, 7:09 AM  PGY-1, [facility] FPTS Intern pager: [phone], text pages welcome Secure chat group [facility] Teaching Service   "

## 2024-08-22 NOTE — Progress Notes (Signed)
 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  Also being evaluated for dysphagia in the setting of previous CVA   Interval History   -- Administered MiraLAX  and Dulcolax yesterday with passage of medium, nonbloody bowel movement overnight -- KUB this morning shows slight improvement in rectal stool burden -- Describes mild abdominal discomfort both in the lower and upper abdomen -- SLP evaluation performed today for concern of dysphagia and potential aspiration    Objective   Vital signs in last 24 hours: Temp:  [98.1 F (36.7 C)-98.3 F (36.8 C)] 98.2 F (36.8 C) (01/20 0319) Pulse Rate:  [56-66] 56 (01/20 0319) Resp:  [16-18] 18 (01/20 0319) BP: (101-117)/(52-78) 101/52 (01/20 0319) SpO2:  [100 %] 100 % (01/20 0319) Weight:  [77.5 kg-82.1 kg] 82.1 kg (01/20 0414) Last BM Date : 08/21/24 General: Elderly gentleman resting quietly in bed no distress Heart:  Regular rate and rhythm; 2/6 systolic murmur Lungs: Respirations even and unlabored, lungs CTA bilaterally Abdomen:  Soft, nontender and nondistended. Normal bowel sounds. Extremities:  Without edema. Neurologic:  Alert and oriented,  grossly normal neurologically. Psych:  Cooperative. Normal mood and affect.  Intake/Output from previous day: 01/19 0701 - 01/20 0700 In: 190.9 [IV Piggyback:190.9] Out: -  Intake/Output this shift: No intake/output data recorded.  Lab Results: Recent Labs    08/21/24 0215 08/21/24 0642 08/21/24 1559 08/22/24 0425  WBC 6.2  --   --  5.7  HGB 12.7* 11.9* 11.1* 10.4*  HCT 38.7* 35.7* 33.6* 32.3*  PLT 298  --   --  229   BMET Recent Labs     08/21/24 0215 08/22/24 0425  NA 141 139  K 4.0 4.1  CL 104 108  CO2 26 20*  GLUCOSE 157* 127*  BUN 31* 37*  CREATININE 1.49* 1.64*  CALCIUM 8.5* 7.7*   LFT Recent Labs    08/21/24 0215  PROT 6.3*  ALBUMIN 3.4*  AST 18  ALT 8  ALKPHOS 57  BILITOT 0.3   PT/INR Recent Labs    08/21/24 0215  LABPROT 13.9  INR 1.0    Studies/Results: DG Abd 1 View Result Date: 08/22/2024 CLINICAL DATA:  GI bleed. EXAM: ABDOMEN - 1 VIEW COMPARISON:  Abdominal radiographs 09/24/2014. Abdominal CTA 04/20/2023 and 08/21/2024. FINDINGS: Two supine views of the abdomen are submitted. There is a nonobstructive bowel gas pattern. Rectal stool burden appears mildly improved compared with recent prior CT. There is faint contrast material within the bladder attributed to the recent CT. Scattered vascular calcifications and pelvic phleboliths are stable. Mild spondylosis noted. IMPRESSION: No evidence of bowel obstruction. Mildly improved rectal stool burden. Electronically Signed   By: Elsie Perone M.D.   On: 08/22/2024 08:49   CT ANGIO GI BLEED Result Date: 08/21/2024 EXAM: CTA ABDOMEN AND PELVIS WITH CONTRAST 08/21/2024 05:57:07 AM TECHNIQUE: CTA images of the abdomen and pelvis with intravenous contrast. Three-dimensional MIP/volume rendered formations were performed. Automated exposure control, iterative reconstruction, and/or weight based adjustment of the mA/kV was utilized to reduce the radiation dose to as low as reasonably achievable. COMPARISON: CT abdomen and pelvis with IV contrast 04/20/2023 and 12/09/2022. CLINICAL HISTORY: acute GI bleed, h/o diverticular bleed with IR intervention FINDINGS: VASCULATURE: GI BLEED: No  active extravasation of contrast within the GI tract. AORTA: Heavy calcification in the abdominal aorta, particularly the infrarenal segment. No aneurysm, stenosis, dissection, or penetrating ulcer. CELIAC TRUNK: Calcifications inferiorly in the proximal celiac artery. No more than  40% proximal celiac artery stenosis. The vessel is otherwise widely patent. SUPERIOR MESENTERIC ARTERY: Moderate to heavy calcific plaques up to the vessel inflection point. No more than 40% irregular stenosis and otherwise patent. INFERIOR MESENTERIC ARTERY: No acute finding. No occlusion or significant stenosis. RENAL ARTERIES: Calcific plaques at both renal artery origins. Both vessels are widely patent without hilar branch occlusion. ILIAC ARTERIES: Bilateral iliac arteries are heavily calcified. The common iliac and external iliac arteries exhibit no flow-limiting stenosis. Moderate to severe irregular stenosis in the right internal iliac artery. Calcific occlusion of the left internal iliac artery which reconstitutes in the dorsal left pelvic sidewall. No occlusive disease of the proximal outflow arteries. ABDOMEN/PELVIS: LOWER CHEST: Posterior atelectasis in the lung bases without acute process. Moderate bilateral gynecomastia. The cardiac size is normal. There are left main and 2 vessel coronary calcifications in the left anterior descending and circumflex, greatest in the left anterior descending. LIVER: Multiple hepatic cysts, the largest is in the dome of segment 8 measuring 5.3 cm. No new abnormality is seen in the liver. GALLBLADDER AND BILE DUCTS: Gallbladder is unremarkable. No biliary ductal dilatation. SPLEEN: The spleen is unremarkable. PANCREAS: The pancreas is unremarkable. ADRENAL GLANDS: No adrenal mass. KIDNEYS, URETERS AND BLADDER: Occasional few bilateral Bosniak 2 subcentimeter cortical cysts which are too small to characterize. No follow-up imaging is recommended. No stones in the kidneys or ureters. No hydronephrosis. No perinephric or periureteral stranding. There is a 2.4 cm right posterolateral bladder wall diverticulum, unchanged. The bladder is otherwise unremarkable. GI AND BOWEL: Thickened folds in the stomach, most likely due to gastritis or peptic ulcer disease. No inflammatory  changes are seen. There is no small bowel obstruction. The appendix is normal. There is a large amount of retained stool, particularly in the rectosigmoid segment. There is mild wall thickening in the rectum with perirectal stranding compatible with stercoral proctitis. REPRODUCTIVE: Reproductive organs are unremarkable. PERITONEUM AND RETROPERITONEUM: Trace reactive fluid in the dorsal deep pelvis but no free hemorrhage, free fluid, pneumatosis, or abscess. LYMPH NODES: No lymphadenopathy. BONES AND SOFT TISSUES: Osteopenia, mild dextroscoliosis and degenerative changes of the lumbar spine. No acute or significant osseous findings. Bilateral small inguinal hernias without incarceration. No acute soft tissue abnormality. IMPRESSION: 1. No active GI bleeding. 2. Mild rectal wall thickening with perirectal stranding compatible with stercoral proctitis. Large amount of retained stool. 3. 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 . 4. Coronary calcifications heaviest in the lad. Aortic and branch vessel atherosclerosis as described above. 5. Bilateral gynecomastia , diffuse fibroglandular type. 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  Bowel regimen initiated 08/21/2024 with MiraLAX  and Dulcolax resulting in the passage of a medium sized nonbloody bowel movement overnight.  KUB documents mildly  improved rectal stool  burden.  Patient reports mild ongoing abdominal discomfort.  There has been a slight decline in hemoglobin 12.7 --> 11.1 --> 10.4.  This may potentially be dilutional in the absence of overt bleeding.  Also being evaluated for dysphagia in the setting of previous CVA.  SLP evaluation with modified barium swallow performed today.   Plan  Continue MiraLAX  17 g p.o. twice daily Continue Dulcolax 10 mg p.o. daily Monitor stools for presence of overt bleeding No plans for endoscopic evaluation at this time Follow-up results of of SLP evaluation and modified barium swallow.    LOS: 0 days   Inocente CHRISTELLA Hausen  08/22/2024, 2:35 PM  Inocente Hausen, MD [facility]  "

## 2024-08-23 ENCOUNTER — Observation Stay (HOSPITAL_COMMUNITY)

## 2024-08-23 DIAGNOSIS — K625 Hemorrhage of anus and rectum: Secondary | ICD-10-CM

## 2024-08-23 DIAGNOSIS — D62 Acute posthemorrhagic anemia: Secondary | ICD-10-CM | POA: Diagnosis not present

## 2024-08-23 DIAGNOSIS — K5900 Constipation, unspecified: Secondary | ICD-10-CM | POA: Diagnosis not present

## 2024-08-23 DIAGNOSIS — K626 Ulcer of anus and rectum: Secondary | ICD-10-CM | POA: Diagnosis not present

## 2024-08-23 DIAGNOSIS — R1310 Dysphagia, unspecified: Secondary | ICD-10-CM | POA: Diagnosis not present

## 2024-08-23 LAB — CBC
HCT: 30.4 % — ABNORMAL LOW (ref 39.0–52.0)
Hemoglobin: 10.1 g/dL — ABNORMAL LOW (ref 13.0–17.0)
MCH: 30.3 pg (ref 26.0–34.0)
MCHC: 33.2 g/dL (ref 30.0–36.0)
MCV: 91.3 fL (ref 80.0–100.0)
Platelets: 251 K/uL (ref 150–400)
RBC: 3.33 MIL/uL — ABNORMAL LOW (ref 4.22–5.81)
RDW: 13.6 % (ref 11.5–15.5)
WBC: 5.9 K/uL (ref 4.0–10.5)
nRBC: 0 % (ref 0.0–0.2)

## 2024-08-23 LAB — BASIC METABOLIC PANEL WITH GFR
Anion gap: 8 (ref 5–15)
BUN: 33 mg/dL — ABNORMAL HIGH (ref 8–23)
CO2: 27 mmol/L (ref 22–32)
Calcium: 8.3 mg/dL — ABNORMAL LOW (ref 8.9–10.3)
Chloride: 110 mmol/L (ref 98–111)
Creatinine, Ser: 1.59 mg/dL — ABNORMAL HIGH (ref 0.61–1.24)
GFR, Estimated: 41 mL/min — ABNORMAL LOW
Glucose, Bld: 181 mg/dL — ABNORMAL HIGH (ref 70–99)
Potassium: 4.5 mmol/L (ref 3.5–5.1)
Sodium: 145 mmol/L (ref 135–145)

## 2024-08-23 LAB — MAGNESIUM: Magnesium: 2.2 mg/dL (ref 1.7–2.4)

## 2024-08-23 LAB — PHOSPHORUS: Phosphorus: 3.5 mg/dL (ref 2.5–4.6)

## 2024-08-23 LAB — GLUCOSE, CAPILLARY
Glucose-Capillary: 166 mg/dL — ABNORMAL HIGH (ref 70–99)
Glucose-Capillary: 259 mg/dL — ABNORMAL HIGH (ref 70–99)
Glucose-Capillary: 265 mg/dL — ABNORMAL HIGH (ref 70–99)
Glucose-Capillary: 269 mg/dL — ABNORMAL HIGH (ref 70–99)
Glucose-Capillary: 271 mg/dL — ABNORMAL HIGH (ref 70–99)
Glucose-Capillary: 309 mg/dL — ABNORMAL HIGH (ref 70–99)

## 2024-08-23 MED ORDER — ENSURE PLUS HIGH PROTEIN PO LIQD
237.0000 mL | Freq: Two times a day (BID) | ORAL | Status: DC
  Administered 2024-08-23 – 2024-08-24 (×3): 237 mL via ORAL

## 2024-08-23 MED ORDER — HYDROCORTISONE ACETATE 25 MG RE SUPP
25.0000 mg | Freq: Two times a day (BID) | RECTAL | Status: DC
  Administered 2024-08-23 – 2024-08-24 (×3): 25 mg via RECTAL
  Filled 2024-08-23 (×4): qty 1

## 2024-08-23 MED ORDER — THIAMINE MONONITRATE 100 MG PO TABS
100.0000 mg | ORAL_TABLET | Freq: Every day | ORAL | Status: DC
  Administered 2024-08-23 – 2024-08-24 (×2): 100 mg via ORAL
  Filled 2024-08-23 (×2): qty 1

## 2024-08-23 MED ORDER — ADULT MULTIVITAMIN W/MINERALS CH
1.0000 | ORAL_TABLET | Freq: Every day | ORAL | Status: DC
  Administered 2024-08-23 – 2024-08-24 (×2): 1 via ORAL
  Filled 2024-08-23 (×2): qty 1

## 2024-08-23 MED ORDER — INSULIN GLARGINE 100 UNIT/ML ~~LOC~~ SOLN
8.0000 [IU] | Freq: Every morning | SUBCUTANEOUS | Status: DC
  Administered 2024-08-23 – 2024-08-24 (×2): 8 [IU] via SUBCUTANEOUS
  Filled 2024-08-23 (×3): qty 0.08

## 2024-08-23 NOTE — Assessment & Plan Note (Addendum)
 Stable.  4 episodes of bloody bowel movements overnight.  Patient does not complain of pain. [facility] consulted, appreciate recommendations   - No need for abx at this time, patient is not a good candidate for scope - Continue Protonix  40 mg BID  - Need to purge bowels of stool with MiraLax  and Dulcolax before treating inflammation with Anusol  suppositories - KUB today to assess stool burden, will begin Anusol  suppositories as stool burden is cleared - Pain control with Tylenol  q6h as needed

## 2024-08-23 NOTE — Progress Notes (Signed)
 Speech Language Pathology Treatment: Dysphagia  Patient Details Name: Bill Jackson MRN: [id number] DOB: [birth year] Today's Date: 08/23/2024 Time: 8344-8289 SLP Time Calculation (min) (ACUTE ONLY): 15 min  Assessment / Plan / Recommendation Clinical Impression  Plan: continue Dys 1, honey thick liquids, SLP will continue to follow.  Patient seen by SLP for skilled treatment focused on dysphagia goals. Patient was awake, alert, god daughter present in room (he does not have biological children). Patient was a little agitated after he had recently been cleaned up following BM s/p suppository. He did calm down and was receptive to having PO's from his dinner meal tray. He drank honey thick liquids via straw sip with mild throat clear following large sip. He was able to feed self puree solids without observed difficulty. SLP spoke with his god daughter and both in agreement to continue with honey thick liquids for now and if SLP unable to safely advance his liquids while admitted, SLP at SNF could work with patient on liquid consistency advancement.    HPI HPI: 89 y.o. with acute on chronic constipation, lower GI bleeding, stercoral colitis. PMH: henorrhagic CVA w/ R hemiplegia (10/2012), prior requirement for PEG, PNA (2022/2023), GERD, fall (2024) with 8th rib fx, diabetes, syncope, HTN, low back pain, glaucoma, neuropathy, history of gastrostomy. Previous MBS (12/2022) with sensed moderately significant aspiration events of nectar and thin liquids. Dys 2/honey thick recommended. Pt is an unreliable historian.      SLP Plan  Continue with current plan of care        Swallow Evaluation Recommendations         Recommendations  Diet recommendations: Dysphagia 1 (puree);Honey-thick liquid Liquids provided via: Cup;Straw Medication Administration: Crushed with puree (as tolerated) Supervision: Patient able to self feed;Full supervision/cueing for compensatory strategies Compensations: Slow  rate;Small sips/bites Postural Changes and/or Swallow Maneuvers: Seated upright 90 degrees;Upright 30-60 min after meal                  Oral care QID     Dysphagia, oropharyngeal phase (R13.12)     Continue with current plan of care    Norleen IVAR Blase, MA, CCC-SLP Speech Therapy   08/23/2024, 5:42 PM

## 2024-08-23 NOTE — Assessment & Plan Note (Addendum)
 Remains stable even after multiple bloody bowel movements overnight. - Transfusion threshold <8 given CAD  - Continue home ferrous sulfate 

## 2024-08-23 NOTE — Progress Notes (Signed)
 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  Also being evaluated for dysphagia in the setting of previous CVA   Interval History   -- Reports passing a bowel movement yesterday and again this morning  -- Per bedside RN, there may have been a small amount of blood with bowel movement this morning but was not reported to be concerning by night shift -- Abdominal discomfort improving    Objective   Vital signs in last 24 hours: Temp:  [98.3 F (36.8 C)-98.9 F (37.2 C)] 98.4 F (36.9 C) (01/21 0740) Pulse Rate:  [52-64] 52 (01/21 0740) Resp:  [15-16] 16 (01/21 0740) BP: (91-102)/(46-70) 102/46 (01/21 0740) SpO2:  [99 %-100 %] 100 % (01/21 0740) Weight:  [82.1 kg] 82.1 kg (01/21 0500) Last BM Date : 08/22/24 General: Elderly gentleman resting quietly in bed no distress Heart:  Regular rate and rhythm; 2/6 systolic murmur Lungs: Respirations even and unlabored, lungs CTA bilaterally Abdomen:  Soft, nontender and nondistended. Normal bowel sounds. Extremities:  Without edema. Neurologic:  Alert and oriented,  grossly normal neurologically. Psych:  Cooperative. Normal mood and affect.  Intake/Output from previous day: 01/20 0701 - 01/21 0700 In: 240 [P.O.:240] Out: 750 [Urine:750] Intake/Output this shift: Total I/O In: 240 [P.O.:240] Out: 200 [Urine:200]  Lab Results: Recent Labs    08/21/24 0215 08/21/24 0642 08/22/24 0425 08/22/24 2002 08/23/24 0505  WBC 6.2  --  5.7  --  5.9  HGB 12.7*   < > 10.4* 10.3* 10.1*  HCT 38.7*   < > 32.3* 31.2* 30.4*  PLT 298  --  229  --  251   < > = values in this interval not displayed.    BMET Recent Labs    08/21/24 0215 08/22/24 0425 08/23/24 0505  NA 141 139 145  K 4.0 4.1 4.5  CL 104 108 110  CO2 26 20* 27  GLUCOSE 157* 127* 181*  BUN 31* 37* 33*  CREATININE 1.49* 1.64* 1.59*  CALCIUM 8.5* 7.7* 8.3*   LFT Recent Labs    08/21/24 0215  PROT 6.3*  ALBUMIN 3.4*  AST 18  ALT 8  ALKPHOS 57  BILITOT 0.3   PT/INR Recent Labs    08/21/24 0215  LABPROT 13.9  INR 1.0    Studies/Results: DG Swallowing Func-Speech Pathology Result Date: 08/22/2024 Table formatting from the original result was not included. Modified Barium Swallow Study Patient Details Name: THOAMS SIEFERT MRN: [id number] Date of Birth: [birth year] Today's Date: 08/22/2024 HPI/PMH: HPI: 89 y.o. with acute on chronic constipation, lower GI bleeding, stercoral colitis. PMH: henorrhagic CVA w/ R hemiplegia (10/2012), prior requirement for PEG, PNA (2022/2023), GERD, fall (2024) with 8th rib fx, diabetes, syncope, HTN, low back pain, glaucoma, neuropathy, history of gastrostomy. Previous MBS (12/2022) with sensed moderately significant aspiration events of nectar and thin liquids. Dys 2/honey thick recommended. Pt is an unreliable historian. Clinical Impression: Pt presents with a likely chronic dysphagia but with increase for adverse events during acute hospital stay. Change to Dys 1/Moderately thick liquids as pt displayed overt aspiration of thinner liquids. Pt stated he preferred pureed over minced diet. SLP f/u as able to determine if PO texture upgrade is appropriate, although may be best  done at next level of care.  Pt displayed significant aspiration of thin and mildly-thick liquids. Pt had incomplete laryngeal vestibule closure as well as incoordination of timing of pharyngeal swallow and airway closure. Compensatory techniques of chin tuck and R head turn increased amount of aspiration. Airway protection improved with honey thick liquids and solids when pt had more time for coordination. Pt displayed  moderate pharyngeal residue with puree texture, but this cleared across subsequent swallows. Pt displayed inefficiencies of oral components (i.e. escape to lateral buccal cavity/floor of mouth, oral residue), however oral swallow remains functional for softer diet. Factors that may increase risk of adverse event in presence of aspiration Noe & Lianne 2021): Factors that may increase risk of adverse event in presence of aspiration Noe & Lianne 2021): Respiratory or GI disease; Reduced cognitive function; Inadequate oral hygiene; Limited mobility; Weak cough Recommendations/Plan: Swallowing Evaluation Recommendations Swallowing Evaluation Recommendations Recommendations: PO diet PO Diet Recommendation: Dysphagia 1 (Pureed); Moderately thick liquids (Level 3, honey thick) Liquid Administration via: Cup; Straw Medication Administration: Crushed with puree (Pt stated during MBS he would not take pills if they were not crushed) Supervision: Staff to assist with self-feeding Swallowing strategies  : Slow rate; Small bites/sips Postural changes: Position pt fully upright for meals; Stay upright 30-60 min after meals Oral care recommendations: Oral care BID (2x/day) Caregiver Recommendations: Remove water  pitcher; Avoid jello, ice cream, thin soups, popsicles Treatment Plan Treatment Plan Treatment recommendations: Therapy as outlined in treatment plan below Follow-up recommendations: Skilled nursing-short term rehab (<3 hours/day) Functional status assessment: Patient has had a recent decline in their functional status and/or demonstrates limited ability to make significant improvements in function in a reasonable and predictable amount of time. Treatment frequency: Min 2x/week Treatment duration: 2 weeks Interventions: Aspiration precaution training; Patient/family education; Trials of upgraded texture/liquids; Diet toleration management by SLP Recommendations Recommendations for follow up therapy are one  component of a multi-disciplinary discharge planning process, led by the attending physician.  Recommendations may be updated based on patient status, additional functional criteria and insurance authorization. Assessment: Orofacial Exam: Orofacial Exam Oral Cavity: Oral Hygiene: WFL Oral Cavity - Dentition: Missing dentition; Other (Comment); Dentures, not available Orofacial Anatomy: WFL Oral Motor/Sensory Function: WFL Anatomy: Anatomy: Suspected cervical osteophytes Boluses Administered: Boluses Administered Boluses Administered: Thin liquids (Level 0); Mildly thick liquids (Level 2, nectar thick); Moderately thick liquids (Level 3, honey thick); Puree; Solid  Oral Impairment Domain: Oral Impairment Domain Lip Closure: Escape from interlabial space or lateral juncture, no extension beyond vermillion border Tongue control during bolus hold: Escape to lateral buccal cavity/floor of mouth Bolus preparation/mastication: Slow prolonged chewing/mashing with complete recollection Bolus transport/lingual motion: Brisk tongue motion Oral residue: Residue collection on oral structures Location of oral residue : Tongue; Floor of mouth Initiation of pharyngeal swallow : Pyriform sinuses  Pharyngeal Impairment Domain: Pharyngeal Impairment Domain Soft palate elevation: Trace column of contrast or air between SP and PW Laryngeal elevation: Complete superior movement of thyroid cartilage with complete approximation of arytenoids to epiglottic petiole Anterior hyoid excursion: Complete anterior movement Epiglottic movement: Complete inversion Laryngeal vestibule closure: Incomplete, narrow column air/contrast in laryngeal vestibule Pharyngeal stripping wave : Present - complete Pharyngeal contraction (A/P view only): N/A Pharyngoesophageal segment opening: Complete distension and complete duration, no obstruction of flow Tongue base retraction: Trace column of contrast or air between tongue base and PPW Pharyngeal residue:  Collection of residue within or on pharyngeal structures Location of pharyngeal residue: Valleculae; Pyriform sinuses; Aryepiglottic folds  Esophageal Impairment  Domain: Esophageal Impairment Domain Esophageal clearance upright position: Complete clearance, esophageal coating Pill: No data recorded Penetration/Aspiration Scale Score: Penetration/Aspiration Scale Score 1.  Material does not enter airway: Moderately thick liquids (Level 3, honey thick); Puree; Solid 7.  Material enters airway, passes BELOW cords and not ejected out despite cough attempt by patient: Thin liquids (Level 0) 8.  Material enters airway, passes BELOW cords without attempt by patient to eject out (silent aspiration) : Mildly thick liquids (Level 2, nectar thick) Compensatory Strategies: Compensatory Strategies Compensatory strategies: Yes Straw: Ineffective Ineffective Straw: Thin liquid (Level 0); Mildly thick liquid (Level 2, nectar thick) Chin tuck: Ineffective Ineffective Chin Tuck: Thin liquid (Level 0) Right head turn: Ineffective Ineffective Right Head Turn: Thin liquid (Level 0)   General Information: Caregiver present: No  Diet Prior to this Study: NPO   Temperature : Normal   Respiratory Status: WFL   Supplemental O2: None (Room air)   History of Recent Intubation: No  Behavior/Cognition: Alert; Cooperative Self-Feeding Abilities: Able to self-feed Baseline vocal quality/speech: Normal Volitional Cough: Able to elicit Volitional Swallow: Able to elicit Exam Limitations: No limitations Goal Planning: Prognosis for improved oropharyngeal function: Fair Barriers to Reach Goals: Cognitive deficits; Time post onset; Severity of deficits No data recorded Patient/Family Stated Goal: Daughter Giles) wants MBS. Prefers modified diet until able to tolerate diet of least restriction. Consulted and agree with results and recommendations: Patient Pain: Pain Assessment Pain Assessment: Faces Faces Pain Scale: 0 Pain Location: B feet Pain  Descriptors / Indicators: Burning Pain Intervention(s): Limited activity within patient's tolerance End of Session: Start Time:SLP Start Time (ACUTE ONLY): 1338 Stop Time: SLP Stop Time (ACUTE ONLY): 1409 Time Calculation:SLP Time Calculation (min) (ACUTE ONLY): 31 min Charges: SLP Evaluations $ SLP Speech Visit: 1 Visit SLP Evaluations $BSS Swallow: 1 Procedure $MBS Swallow: 1 Procedure SLP visit diagnosis: SLP Visit Diagnosis: Dysphagia, oropharyngeal phase (R13.12) Past Medical History: Past Medical History: Diagnosis Date  Diabetes mellitus   Dysphagia following cerebrovascular accident   PEG tube  Hemorrhagic stroke (HCC) 12/10/2022  Hyperlipidemia   Hypertension   Left shoulder pain   Right knee pain   Stroke (HCC) 10/01/2012  R side weakness, slurred speech, dizziness, visual changes, ataxia; hemorrhagic cerebellar CVA, evacuated Past Surgical History: Past Surgical History: Procedure Laterality Date  CARDIAC SURGERY    CRANIECTOMY N/A 10/25/2012  Procedure: Suboccipital Craniectomy for Evacuation of Cerebellar Hematoma;  Surgeon: Fairy Levels, MD;  Location: [facility];  Service: Neurosurgery;  Laterality: N/A;  Suboccipital Craniectomy for Evacuation of Cerebellar Hematoma  FLEXIBLE SIGMOIDOSCOPY N/A 04/22/2023  Procedure: FLEXIBLE SIGMOIDOSCOPY;  Surgeon: Stacia Glendia BRAVO, MD;  Location: [facility] ENDOSCOPY;  Service: Gastroenterology;  Laterality: N/A;  heart stents  x3  HERNIA REPAIR    PEG TUBE PLACEMENT    placed ?2014, changed 01/2014 (at [facility])  ROTATOR CUFF REPAIR   Note populated for Rocky Quan, SLP Leita SAILOR., M.A. CCC-SLP Acute Rehabilitation Services Office: [phone] Secure chat preferred 08/22/2024, 5:19 PM  DG Abd 1 View Result Date: 08/22/2024 CLINICAL DATA:  GI bleed. EXAM: ABDOMEN - 1 VIEW COMPARISON:  Abdominal radiographs 09/24/2014. Abdominal CTA 04/20/2023 and 08/21/2024. FINDINGS: Two supine views of the abdomen are submitted. There is a nonobstructive bowel gas pattern. Rectal stool  burden appears mildly improved compared with recent prior CT. There is faint contrast material within the bladder attributed to the recent CT. Scattered vascular calcifications and pelvic phleboliths are stable. Mild spondylosis noted. IMPRESSION: No evidence of bowel obstruction. Mildly improved rectal  stool burden. Electronically Signed   By: Elsie Perone M.D.   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  Bowel regimen initiated 08/21/2024 with MiraLAX  and Dulcolax resulting in the passage bowel movements and improvement in rectal stool burden on KUB 08/22/2024.  Per bedside RN, there was report of patient passing a bowel movement with some blood this morning prior to shift change but did not appear to be an warming volume.    There has been a slight decline in hemoglobin 12.7 --> 10 range. This may potentially be dilutional in the absence of overt bleeding.  Hemoglobin has been relatively stable between 10.1-10.4 over the last 24 hours.   Plan  Continue MiraLAX  17 g p.o. twice daily Continue Dulcolax 10 mg p.o. daily If rectal bleeding persists consider initiation of hydrocortisone  suppositories 25 mg per rectum twice daily as there may be a component of hemorrhoidal bleeding. Monitor stools for presence of overt bleeding No plans for endoscopic evaluation at this time  Given patient's stability, the GI team will sign off at this time.  Please contact our service for any questions or concerns.  Patient is followed by Dr. Glendia Holt in the [facility].  Please notify our team when the patient is nearing discharge and a follow-up  appointment can be coordinated with Dr. Holt.   LOS: 0 days   Inocente HERO Gwyndolyn Guilford  08/23/2024, 1:21 PM  Inocente Hausen, MD [facility]  "

## 2024-08-23 NOTE — Inpatient Diabetes Management (Signed)
 Inpatient Diabetes Program Recommendations  AACE/ADA: New Consensus Statement on Inpatient Glycemic Control (2015)  Target Ranges:  Prepandial:   less than 140 mg/dL      Peak postprandial:   less than 180 mg/dL (1-2 hours)      Critically ill patients:  140 - 180 mg/dL   Lab Results  Component Value Date   GLUCAP 166 (H) 08/23/2024   HGBA1C 9.4 (H) 08/21/2024    Review of Glycemic Control  Latest Reference Range & Units 08/23/24 04:29 08/23/24 09:04 08/23/24 11:35  Glucose-Capillary 70 - 99 mg/dL 734 (H) 730 (H) 833 (H)   Diabetes history: DM 2 Outpatient Diabetes medications:  Lantus  10-20 units daily - If <175 mg/dL- 10 units of Lantus , If CBG is > 175- 15 units of Lantus   Current orders for Inpatient glycemic control:  Novolog  0-9 units q 4 hours  Inpatient Diabetes Program Recommendations:    Consider changing Novolog  correction to tid with meals and HS (bedtime scale).  Also please consider adding Lantus  8 units daily.   Thanks,  Randall Bullocks, RN, BC-ADM Inpatient Diabetes Coordinator Pager [phone]  (8a-5p)

## 2024-08-23 NOTE — Progress Notes (Addendum)
 Initial Nutrition Assessment  DOCUMENTATION CODES:   Severe malnutrition in context of chronic illness  INTERVENTION:   Encourage po intake Currently on DYS1, Honey thick liquids  Automatic trays Nursing to assist with meals  Ensure Plus High Protein po BID, each supplement provides 350 kcal and 20 grams of protein - Each supplement should be mixed to correct consistency  Magic cup TID with meals, each supplement provides 290 kcal and 9 grams of protein - prefers vanilla  MVI with minerals daily 100 mg Thiamine  daily x 7 days  NUTRITION DIAGNOSIS:   Severe Malnutrition related to chronic illness as evidenced by severe fat depletion, severe muscle depletion.  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  Pt resting in bed on assessment is a slightly poor historian and hard to understand d/t aphasia.   Pt was living in a nursing home PTA and it seems like was following a DYS2 diet, pt was unable to answer whether he was on thickened liquids at the facility. Pt reports poor po intake as he did not like the food there, was receiving 3 meals per day but only eating bites-25%. Unable to get clear answer from pt if he was taking supplements at nursing home.  Utilizes a wheelchair to get around.   Unable to evaluate weight loss as it seems weights have been carried over form 05/2023. Likely weights on admission are inflated d/t being bed weights. Pt does report weight loss however question accuracy as pt  reports he went from 240 lbs to now 170 lbs in unknown timeframe. Pt does meet criteria for severe malnutrition based on exam findings of severe muscle and fat wasting.   MBS performed today and pt was placed on pureed diet with honey thick liquids. SLP reached out inquiring about thickening vanilla Boost for this pt per pt's request. We do not have vanilla Boost on formulary, will add Ensure High Protein. Notified nursing to mix supplements to correct consistency with simply thick packets.  Pt ate 100% of his magic cup, 75% of his pureed mac n cheese, and only 25% of his pureed chicken for lunch. Does not like chocolate.   On bowel regimen, some blood in bowel movement this morning. No plans for endoscopic evaluation at this time. GI signed off. KUB today with dimished stool burden compared to yesterday.   Admit weight: 77.5 kg Current weight: 82.1 kg   Average Meal Intake: 1/20-1/21: 100% intake x 2 recorded meals  Nutritionally Relevant Medications: Scheduled Meds:  feeding supplement  237 mL Oral BID BM   insulin  aspart  0-9 Units Subcutaneous Q4H   insulin  glargine  8 Units Subcutaneous q AM   latanoprost   1 drop Both Eyes QHS   multivitamin with minerals  1 tablet Oral Daily   thiamine   100 mg Oral Daily   Labs Reviewed: BUN 33 Creatinine 1.59 GFR 41 CBG ranges from 104-269 mg/dL over the last 24 hours HgbA1c 9.4  NUTRITION - FOCUSED PHYSICAL EXAM:  Flowsheet Row Most Recent Value  Orbital Region Severe depletion  Upper Arm Region Severe depletion  Thoracic and Lumbar Region Severe depletion  Buccal Region Moderate depletion  Temple Region Severe depletion  Clavicle Bone Region Moderate depletion  Clavicle and Acromion Bone Region Severe depletion  Scapular Bone Region Severe depletion  Dorsal Hand Severe depletion  Patellar Region Severe depletion  Anterior Thigh Region Severe depletion  Posterior Calf Region Severe depletion  Edema (RD Assessment) None  Hair  Reviewed  Eyes Reviewed  Mouth Reviewed  Skin Reviewed  Nails Reviewed    Diet Order:   Diet Order             DIET - DYS 1 Room service appropriate? No; Fluid consistency: Honey Thick  Diet effective now                   EDUCATION NEEDS:   Not appropriate for education at this time  Skin:  Skin Assessment: Reviewed RN Assessment  Last BM:  1/20  Height:   Ht Readings from Last 1 Encounters:  08/21/24 6' (1.829 m)    Weight:   Wt Readings from Last 1 Encounters:  08/23/24 82.1 kg    Ideal Body Weight:  80.9 kg  BMI:  Body mass index is 24.55 kg/m.  Estimated Nutritional Needs:   Kcal:  1700-1900 kcal  Protein:  80-100 gm  Fluid:  >1.7L/day   Olivia Kenning, RD Registered Dietitian  See Amion for more information

## 2024-08-23 NOTE — Assessment & Plan Note (Addendum)
 Qtc 566 on 01/19 EKG. - Hold off on antiemetics at this time  - Magnesium  levels WNL

## 2024-08-23 NOTE — Care Management Obs Status (Signed)
 MEDICARE OBSERVATION STATUS NOTIFICATION   [name] MRN: [id number] Date of Birth: [birth year]   Medicare Observation Status Notification Given:  Yes    Vonzell Arrie Sharps 08/23/2024, 1:31 PM

## 2024-08-23 NOTE — Assessment & Plan Note (Signed)
 CBG creeping up with addition of diet.  - Continue sensitive SSI - Begin Lantus  8u every morning

## 2024-08-23 NOTE — TOC Initial Note (Signed)
 Transition of Care [facility]) - Initial/Assessment Note    [name] MRN: [id number] Date of Birth: 01-22-1935  Transition of Care [facility]) CM/SW Contact:    Bridget Cordella Simmonds, LCSW Phone Number: 08/23/2024, 4:27 PM  Clinical Narrative:   Pt oriented x3, able to participate in conversation.  Pt from [facility], confirms he lives there and plans to return at [place].  Permission given to speak with friend Olam, sister Foy, who lives out of town.    CSW confirmed with [facility] that pt in LTC and can return tomorrow of ready.  No FL2 needed.                 Expected Discharge Plan: Long Term Nursing Home Barriers to Discharge: Continued Medical Work up   Patient Goals and CMS Choice     Choice offered to / list presented to : Patient      Expected Discharge Plan and Services In-house Referral: Clinical Social Work   Post Acute Care Choice: Skilled Nursing Facility, Resumption of Paediatric Nurse (pt plans to return to [place]) Living arrangements for the past 2 months: Skilled Nursing Facility                                      Prior Living Arrangements/Services Living arrangements for the past 2 months: Skilled Nursing Facility Lives with:: Facility Resident Patient language and need for interpreter reviewed:: Yes Do you feel safe going back to the place where you live?: Yes      Need for Family Participation in Patient Care: Yes (Comment) Care giver support system in place?: Yes (comment) Current home services: Other (comment) (na) Criminal Activity/Legal Involvement Pertinent to Current Situation/Hospitalization: No - Comment as needed  Activities of Daily Living      Permission Sought/Granted Permission sought to share information with : Family Supports Permission granted to share information with : Yes, Verbal Permission Granted  Share Information with NAME: friend Olam, sister Foy           Emotional  Assessment Appearance:: Appears stated age Attitude/Demeanor/Rapport: Engaged Affect (typically observed): Pleasant Orientation: : Oriented to Self, Oriented to Place, Oriented to  Time      Admission diagnosis:  GI bleed [K92.2] Acute GI bleeding [K92.2] Stercoral colitis [K52.89] Patient Active Problem List   Diagnosis Date Noted   Constipation 08/22/2024   Dysphagia 08/22/2024   Anemia 08/21/2024   Stercoral colitis 08/21/2024   QT prolongation 08/21/2024   Iron  deficiency anemia, unspecified 05/06/2023   Hematochezia 04/22/2023   Acute blood loss anemia 04/22/2023   Imaging of gastrointestinal tract abnormal 04/22/2023   Rectal bleed 04/21/2023   Hypomagnesemia 12/16/2022   Hypernatremia 12/16/2022   Acute metabolic encephalopathy 12/16/2022   Rhabdomyolysis 12/16/2022   Sinus bradycardia 12/16/2022   NSVT (nonsustained ventricular tachycardia) (HCC) 12/16/2022   Acute combined systolic and diastolic congestive heart failure (HCC) 12/16/2022   NSTEMI (non-ST elevated myocardial infarction) (HCC) 12/16/2022   Syncope and collapse 12/16/2022   Rib fractures 12/16/2022   PAF (paroxysmal atrial fibrillation) (HCC) 12/11/2022   HFrEF (heart failure with reduced ejection fraction) (HCC) 12/10/2022   Diabetic neuropathy (HCC) 12/09/2022   Esophageal reflux 12/09/2022   Fall 12/09/2022   Elevated troponin 12/09/2022   Cerebellar Hemorrhagic stroke (HCC) 10/02/2022   Cellulitis of buttock 10/30/2020   Left shoulder pain 07/11/2013   Protein-calorie malnutrition, severe 06/12/2013  Syncope 06/11/2013   Right shoulder pain 11/17/2012   Right sided weakness 11/17/2012   Low back pain 11/15/2012   Glaucoma 11/03/2012   Headache 10/29/2012   Cough 10/29/2012   OA (osteoarthritis) of knee-right 10/28/2012   Accelerated hypertension 10/28/2012   Hypokalemia 10/28/2012   Acute cerebellar hemorrhage (HCC) 10/28/2012   Encounter for long-term (current) use of medications  10/28/2012   Type 2 diabetes mellitus (HCC) 10/25/2012   Hypertension 10/25/2012   PCP:  Marsa Arlean BIRCH, MD (Inactive) Pharmacy:   [facility] [phone] - [place], [place] - [address] [facility] [address] [facility] [address] [place] [address] Phone: [phone] Fax: [phone]  [facility] - [place], [place] - [address] [place] [address] [place] [address] [place] [address] Phone: [phone] Fax: [phone]     Social Drivers of Health (SDOH) Social History: SDOH Screenings   Food Insecurity: No Food Insecurity (04/21/2023)  Housing: Low Risk (04/21/2023)  Transportation Needs: No Transportation Needs (04/21/2023)  Utilities: Not At Risk (04/21/2023)  Depression (PHQ2-9): Low Risk (05/27/2023)  Tobacco Use: High Risk (08/21/2024)   SDOH Interventions:     Readmission Risk Interventions     No data to display

## 2024-08-23 NOTE — Progress Notes (Addendum)
 "    Daily Progress Note Intern Pager: [phone]  Patient name: Bill Jackson Medical record number: [id number] Date of birth: 18-Apr-1935 Age: 89 y.o. Gender: male  Primary Care Provider: Marsa Arlean BIRCH, MD (Inactive) Consultants: GI Code Status: Full code- default (previously DNR, but unable to confirm with patient and family/facility unavailable to confirm code status)    Pt Overview and Major Events to Date:  01/19: Admitted for rectal bleeding in the setting of stercoral colitis  Assessment and Plan:   Darlene Bartelt is an 89 year old male with past medical history of PAF on amiodarone , cerebellar hemorrhagic stroke, lower GI bleed 2024, admitted for rectal bleeding in the setting of stercoral colitis. Assessment & Plan Rectal bleed Stercoral colitis Stable.  4 episodes of bloody bowel movements overnight.  Patient does not complain of pain. [facility] consulted, appreciate recommendations   - No need for abx at this time, patient is not a good candidate for scope - Continue Protonix  40 mg BID  - Need to purge bowels of stool with MiraLax  and Dulcolax before treating inflammation with Anusol  suppositories - KUB today to assess stool burden, will begin Anusol  suppositories as stool burden is cleared - Pain control with Tylenol  q6h as needed Type 2 diabetes mellitus (HCC) CBG creeping up with addition of diet.  - Continue sensitive SSI - Begin Lantus  8u every morning Anemia Remains stable even after multiple bloody bowel movements overnight. - Transfusion threshold <8 given CAD  - Continue home ferrous sulfate   QT prolongation Qtc 566 on 01/19 EKG. - Hold off on antiemetics at this time  - Magnesium  levels WNL  FEN/GI: Dysphagia 1 PPx: N/A Dispo:SNF pending clinical improvement . Barriers include bowel purge.   Subjective:  Patient was seen and examined at bedside.  He is alert and oriented x 3 and knows that today is his 89th birthday.  Easier to understand  today.  No complaints of abdominal pain or pain with defecation at this time.  Objective: Temp:  [98.3 F (36.8 C)-98.9 F (37.2 C)] 98.7 F (37.1 C) (01/21 0220) Pulse Rate:  [59-64] 59 (01/21 0220) Resp:  [15-16] 16 (01/21 0220) BP: (91-100)/(52-70) 91/70 (01/21 0220) SpO2:  [99 %-100 %] 99 % (01/21 0220) Weight:  [82.1 kg] 82.1 kg (01/21 0500) Physical Exam: General: Lying supine comfortably in hospital bed in no acute distress, did a little dance for his birthday Cardiovascular: Distant heart sounds, possible murmur Respiratory: Normal work of breathing on room air, CTAB, good respiratory effort Abdomen: Soft, diffusely tender, bowel sounds present Extremities: Moves all extremities equally, no peripheral edema  Laboratory: Most recent CBC Lab Results  Component Value Date   WBC 5.9 08/23/2024   HGB 10.1 (L) 08/23/2024   HCT 30.4 (L) 08/23/2024   MCV 91.3 08/23/2024   PLT 251 08/23/2024   Most recent BMP    Latest Ref Rng & Units 08/23/2024    5:05 AM  BMP  Glucose 70 - 99 mg/dL 818   BUN 8 - 23 mg/dL 33   Creatinine 9.38 - 1.24 mg/dL 8.40   Sodium 864 - 854 mmol/L 145   Potassium 3.5 - 5.1 mmol/L 4.5   Chloride 98 - 111 mmol/L 110   CO2 22 - 32 mmol/L 27   Calcium 8.9 - 10.3 mg/dL 8.3    Imaging/Diagnostic Tests: KUB 1/21: IMPRESSION: 1. Diminished colonic stool burden from yesterday's exam with small to moderate residual. 2. Question gaseous gastric distension.  Lupie Credit, DO 08/23/2024,  7:20 AM  PGY-1, [facility] FPTS Intern pager: [phone], text pages welcome Secure chat group [facility] Teaching Service   "

## 2024-08-23 NOTE — Assessment & Plan Note (Addendum)
 Stable.  4 episodes of bloody bowel movements overnight.  Patient does not complain of pain. [facility] consulted, appreciate recommendations   - No need for abx at this time, patient is not a good candidate for scope - Continue Protonix  40 mg BID  - Need to purge bowels of stool with MiraLax  and Dulcolax before treating inflammation with Anusol  suppositories - KUB today to assess stool burden, will begin Anusol  suppositories as stool burden is cleared - Pain control with Tylenol  q6h as needed

## 2024-08-24 DIAGNOSIS — K625 Hemorrhage of anus and rectum: Secondary | ICD-10-CM | POA: Diagnosis not present

## 2024-08-24 LAB — CBC
HCT: 29 % — ABNORMAL LOW (ref 39.0–52.0)
Hemoglobin: 9.8 g/dL — ABNORMAL LOW (ref 13.0–17.0)
MCH: 30.4 pg (ref 26.0–34.0)
MCHC: 33.8 g/dL (ref 30.0–36.0)
MCV: 90.1 fL (ref 80.0–100.0)
Platelets: 246 K/uL (ref 150–400)
RBC: 3.22 MIL/uL — ABNORMAL LOW (ref 4.22–5.81)
RDW: 13.5 % (ref 11.5–15.5)
WBC: 5 K/uL (ref 4.0–10.5)
nRBC: 0 % (ref 0.0–0.2)

## 2024-08-24 LAB — BASIC METABOLIC PANEL WITH GFR
Anion gap: 9 (ref 5–15)
BUN: 27 mg/dL — ABNORMAL HIGH (ref 8–23)
CO2: 24 mmol/L (ref 22–32)
Calcium: 8.2 mg/dL — ABNORMAL LOW (ref 8.9–10.3)
Chloride: 111 mmol/L (ref 98–111)
Creatinine, Ser: 1.3 mg/dL — ABNORMAL HIGH (ref 0.61–1.24)
GFR, Estimated: 53 mL/min — ABNORMAL LOW
Glucose, Bld: 145 mg/dL — ABNORMAL HIGH (ref 70–99)
Potassium: 3.6 mmol/L (ref 3.5–5.1)
Sodium: 144 mmol/L (ref 135–145)

## 2024-08-24 LAB — GLUCOSE, CAPILLARY
Glucose-Capillary: 152 mg/dL — ABNORMAL HIGH (ref 70–99)
Glucose-Capillary: 149 mg/dL — ABNORMAL HIGH (ref 70–99)
Glucose-Capillary: 227 mg/dL — ABNORMAL HIGH (ref 70–99)
Glucose-Capillary: 269 mg/dL — ABNORMAL HIGH (ref 70–99)
Glucose-Capillary: 189 mg/dL — ABNORMAL HIGH (ref 70–99)

## 2024-08-24 LAB — HEMOGLOBIN AND HEMATOCRIT, BLOOD
HCT: 31.8 % — ABNORMAL LOW (ref 39.0–52.0)
Hemoglobin: 10.5 g/dL — ABNORMAL LOW (ref 13.0–17.0)

## 2024-08-24 MED ORDER — HYDROCORTISONE ACETATE 25 MG RE SUPP: 25.0000 mg | Freq: Two times a day (BID) | RECTAL | Status: AC

## 2024-08-24 MED ORDER — BISACODYL 5 MG PO TBEC: 10.0000 mg | DELAYED_RELEASE_TABLET | Freq: Every day | ORAL | Status: AC

## 2024-08-24 MED ORDER — VITAMIN B-1 100 MG PO TABS: 100.0000 mg | ORAL_TABLET | Freq: Every day | ORAL | Status: AC

## 2024-08-24 MED ORDER — POLYETHYLENE GLYCOL 3350 17 G PO PACK: 17.0000 g | PACK | Freq: Two times a day (BID) | ORAL | Status: AC

## 2024-08-24 NOTE — Assessment & Plan Note (Deleted)
 Stable.  2 episodes of bloody bowel movements since yesterday.  Patient does not complain of pain. [facility] consulted, appreciate recommendations (s/o 01/21)  - No need for abx at this time, patient is not a good candidate for scope - Continue Protonix  40 mg BID  - Need to purge bowels of stool with MiraLax  and Dulcolax before treating inflammation with Anusol  suppositories - Continue bowel regimen at outpatient facility - Patient had a BM soon after first supp, declined second, irate this morning about no one checking on him since 2am and having an accident in the bed yesterday, says he'll try again this morning to help stop the bleeding. D/c back to SNF with a few supps since bleeding seems to continue? - Pain control with Tylenol  q6h as needed

## 2024-08-24 NOTE — Plan of Care (Signed)
" °  Problem: Education: Goal: Ability to describe self-care measures that may prevent or decrease complications (Diabetes Survival Skills Education) will improve Outcome: Adequate for Discharge Goal: Individualized Educational Video(s) Outcome: Adequate for Discharge   Problem: Coping: Goal: Ability to adjust to condition or change in health will improve Outcome: Adequate for Discharge   Problem: Fluid Volume: Goal: Ability to maintain a balanced intake and output will improve Outcome: Adequate for Discharge   Problem: Health Behavior/Discharge Planning: Goal: Ability to identify and utilize available resources and services will improve Outcome: Adequate for Discharge Goal: Ability to manage health-related needs will improve Outcome: Adequate for Discharge   Problem: Metabolic: Goal: Ability to maintain appropriate glucose levels will improve Outcome: Adequate for Discharge   Problem: Nutritional: Goal: Maintenance of adequate nutrition will improve Outcome: Adequate for Discharge Goal: Progress toward achieving an optimal weight will improve Outcome: Adequate for Discharge   Problem: Skin Integrity: Goal: Risk for impaired skin integrity will decrease Outcome: Adequate for Discharge   Problem: Tissue Perfusion: Goal: Adequacy of tissue perfusion will improve Outcome: Adequate for Discharge   Problem: Education: Goal: Knowledge of General Education information will improve Description: Including pain rating scale, medication(s)/side effects and non-pharmacologic comfort measures Outcome: Adequate for Discharge   Problem: Health Behavior/Discharge Planning: Goal: Ability to manage health-related needs will improve Outcome: Adequate for Discharge   Problem: Clinical Measurements: Goal: Ability to maintain clinical measurements within normal limits will improve Outcome: Adequate for Discharge Goal: Will remain free from infection 08/24/2024 1620 by Kimber Selinda GAILS,  RN Outcome: Adequate for Discharge 08/24/2024 1414 by Kimber Selinda GAILS, RN Outcome: Progressing Goal: Diagnostic test results will improve Outcome: Adequate for Discharge Goal: Respiratory complications will improve Outcome: Adequate for Discharge Goal: Cardiovascular complication will be avoided Outcome: Adequate for Discharge   Problem: Activity: Goal: Risk for activity intolerance will decrease Outcome: Adequate for Discharge   Problem: Nutrition: Goal: Adequate nutrition will be maintained 08/24/2024 1620 by Kimber Selinda GAILS, RN Outcome: Adequate for Discharge 08/24/2024 1414 by Kimber Selinda GAILS, RN Outcome: Progressing   Problem: Coping: Goal: Level of anxiety will decrease 08/24/2024 1620 by Kimber Selinda GAILS, RN Outcome: Adequate for Discharge 08/24/2024 1414 by Kimber Selinda GAILS, RN Outcome: Progressing   Problem: Elimination: Goal: Will not experience complications related to bowel motility Outcome: Adequate for Discharge Goal: Will not experience complications related to urinary retention Outcome: Adequate for Discharge   Problem: Pain Managment: Goal: General experience of comfort will improve and/or be controlled 08/24/2024 1620 by Kimber Selinda GAILS, RN Outcome: Adequate for Discharge 08/24/2024 1414 by Kimber Selinda GAILS, RN Outcome: Progressing   Problem: Safety: Goal: Ability to remain free from injury will improve 08/24/2024 1620 by Kimber Selinda GAILS, RN Outcome: Adequate for Discharge 08/24/2024 1414 by Kimber Selinda GAILS, RN Outcome: Progressing   Problem: Skin Integrity: Goal: Risk for impaired skin integrity will decrease 08/24/2024 1620 by Kimber Selinda GAILS, RN Outcome: Adequate for Discharge 08/24/2024 1414 by Kimber Selinda GAILS, RN Outcome: Progressing   "

## 2024-08-24 NOTE — Assessment & Plan Note (Deleted)
 Remains stable even after multiple bloody bowel movements overnight. - Transfusion threshold <8 given CAD  - Continue home ferrous sulfate 

## 2024-08-24 NOTE — Plan of Care (Signed)
   Problem: Nutrition: Goal: Adequate nutrition will be maintained Outcome: Progressing   Problem: Pain Managment: Goal: General experience of comfort will improve and/or be controlled Outcome: Progressing   Problem: Safety: Goal: Ability to remain free from injury will improve Outcome: Progressing

## 2024-08-24 NOTE — Progress Notes (Signed)
 [name] MRN: [id number] Date of Birth: 07-17-1935  Patient discharging to [facility], room [phone], report called and AVS reviewed. AVS placed in discharge package and awaiting PTAR to transport.

## 2024-08-24 NOTE — Assessment & Plan Note (Deleted)
 Qtc 566 on 01/19 EKG. - Hold off on antiemetics at this time  - Magnesium  levels WNL

## 2024-08-24 NOTE — TOC Transition Note (Signed)
 Transition of Care [facility]) - Discharge Note   [name] MRN: [id number] Date of Birth: April 13, 1935  [facility] CM/SW Contact:  Bridget Cordella Simmonds, LCSW Phone Number: 08/24/2024, 3:52 PM   Clinical Narrative:   Pt discharging to [facility], room 904A. RN call report to [phone].   PTAR called 1550.   1515: CSW confirmed with [facility]: they can receive pt today.    Final next level of care: Long Term Nursing Home Barriers to Discharge: Barriers Resolved   Patient Goals and CMS Choice     Choice offered to / list presented to : Patient      Discharge Placement              Patient chooses bed at: [facility] Patient to be transferred to facility by: ptar Name of family member notified: left message with friend Olam, spoke with sister Foy Patient and family notified of of transfer: 08/24/24  Discharge Plan and Services Additional resources added to the After Visit Summary for   In-house Referral: Clinical Social Work   Post Acute Care Choice: Skilled Nursing Facility, Resumption of Paediatric Nurse (pt plans to return to [place])                               Social Drivers of Health (SDOH) Interventions SDOH Screenings   Food Insecurity: No Food Insecurity (04/21/2023)  Housing: Low Risk (04/21/2023)  Transportation Needs: No Transportation Needs (04/21/2023)  Utilities: Not At Risk (04/21/2023)  Depression (PHQ2-9): Low Risk (05/27/2023)  Tobacco Use: High Risk (08/21/2024)     Readmission Risk Interventions     No data to display

## 2024-08-24 NOTE — Assessment & Plan Note (Deleted)
 CBG creeping up with addition of diet.  - Continue sensitive SSI - Begin Lantus  8u every morning

## 2024-08-24 NOTE — Discharge Summary (Signed)
 "  [facility] Discharge Summary  Patient name: Bill Jackson Medical record number: [id number] Date of birth: [birth year] Age: 89 y.o. Gender: male Date of Admission: 08/21/2024  Date of Discharge: 08/24/24 Admitting Physician: Rollene FORBES Keeling, MD  Primary Care Provider: Marsa Arlean BIRCH, MD (Inactive) Consultants: GI  Indication for Hospitalization: Rectal bleed  Discharge Diagnoses/Problem List:  Principal Problem for Admission: Rectal bleed Other Problems addressed during stay:  Principal Problem:   Rectal bleed Active Problems:   Type 2 diabetes mellitus (HCC)   Anemia   Stercoral colitis   QT prolongation   Constipation   Dysphagia     Brief Hospital Course:  Bill Jackson is a 89 y.o.male with a history of cerebellar hemorrhagic stroke, PAF on amiodarone , prior GI bleed 2024 who was admitted to the [facility] Medicine Teaching Service at [facility] for GI bleed in the setting of stercoral colitis. His hospital course is detailed below:  GI Bleed  Anemia Patient presented to ED from long-term facility due to multiple bowel movements with dark red blood and clots in addition to diffuse abdominal pain.  He was hemodynamically stable on arrival, though found to be anemic to 11.9.  CT showed thickened gastric folds with stercoral colitis and increased stool burden but no active bleeding.  GI was consulted and suspicious for lower GI bleeding related to stercoral ulcer from constipation.  As such bowel regimen with MiraLAX  twice daily and Dulcolax daily was initiated.  He did receive 1 dose each of CTX and flagyl  in the ED but this did not need to be continued in the setting of stercoral ulcer bleed.  Upon discharge patient's hemoglobin stabilized.  He was continued on hydrocortisone  suppositories due to concern for hemorrhoidal bleed.  Endoscopic evaluation was not performed.  Patient to follow-up outpatient with [facility].  Patient was recommended by SLP  to have Dysphagia 1 diet.   Other chronic conditions were medically managed with home medications and formulary alternatives as necessary (anemia, CAD, HFrEF, T2DM, HTN, CKD Stage 3a)  PCP Follow-up Recommendations: Continue bowel regimen and adjust as needed. Should follow up with [facility] (primarily sees Dr. Stacia)  Recheck Hgb and creatinine at PCP follow up   Pertinent imaging:  CT Angio GI Bleed IMPRESSION: 1. No active GI bleeding. 2. Mild rectal wall thickening with perirectal stranding compatible with stercoral proctitis. Large amount of retained stool. 3. 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 . 4. Coronary calcifications heaviest in the lad. Aortic and branch vessel atherosclerosis as described above. 5. Bilateral gynecomastia , diffuse fibroglandular type.   Electronically signed by: Francis Quam MD 08/21/2024 06:34 AM EST RP Workstation: [id number]   DG abd 1 view 1/20 and 1/21: IMPRESSION: No evidence of bowel obstruction. Mildly improved rectal stool burden.     Electronically Signed   By: Elsie Perone M.D.   On: 08/22/2024 08:49  IMPRESSION: 1. Diminished colonic stool burden from yesterday's exam with small to moderate residual. 2. Question gaseous gastric distension.     Electronically Signed   By: Andrea Gasman M.D.   On: 08/23/2024 13:38   DG Swallow Func SLP Clinical Impression: Pt presents with a likely chronic dysphagia but with increase for adverse events during acute hospital stay. Change to Dys 1/Moderately thick liquids as pt displayed overt  aspiration of thinner liquids. Pt stated he preferred pureed over minced diet. SLP f/u as able to determine if PO texture upgrade is appropriate, although may be best done at next  level of care.     Results/Tests Pending at Time of Discharge:  Unresulted Labs (From admission, onward)    None        Disposition: SNF  Discharge Condition: Stable  Discharge Exam:  Vitals:   08/24/24 0425 08/24/24 0836  BP: 102/60 122/60  Pulse: (!) 51 (!) 52  Resp: 18 18  Temp: 98.3 F (36.8 C) 98.4 F (36.9 C)  SpO2: 100% 99%   Per Dr. Lupie on the day of discharge: General: Lying supine comfortably in hospital bed in no acute distress Respiratory: Normal work of breathing on room air, speaking loudly in complete sentences without difficulty  Abdomen: non-distended Extremities: Moves all extremities equally, no peripheral edema Neuro: alert and oriented x3, CN II-XII grossly intact  Significant Procedures: n/a  Significant Labs and Imaging:  Recent Labs  Lab 08/23/24 0505 08/24/24 0448 08/24/24 1408  WBC 5.9 5.0  --   HGB 10.1* 9.8* 10.5*  HCT 30.4* 29.0* 31.8*  PLT 251 246  --    Recent Labs  Lab 08/23/24 0505 08/24/24 0448  NA 145 144  K 4.5 3.6  CL 110 111  CO2 27 24  GLUCOSE 181* 145*  BUN 33* 27*  CREATININE 1.59* 1.30*  CALCIUM 8.3* 8.2*  MG 2.2  --   PHOS 3.5  --       Discharge Medications:  Allergies as of 08/24/2024       Reactions   Colestipol Other (See Comments)   Unknown reaction per [facility]   Ezetimibe Other (See Comments)   Unknown reaction per [facility]   Hydrocodone -acetaminophen  Other (See Comments)   Unknown reaction per [facility]   Lisinopril Cough   Lotensin  [benazepril  Hcl] Cough   Not listed under allergies/reactions per [facility]   Lovastatin Other (See Comments)   Unknown reaction per [facility] **Mevacor**   Penicillins Other (See Comments)   pt becomes nervous Has patient had a PCN reaction causing immediate rash, facial/tongue/throat swelling, SOB or lightheadedness with hypotension:  Has patient had a PCN reaction causing severe rash involving mucus membranes or skin necrosis:  Has patient had a PCN reaction that  required hospitalization  Has patient had a PCN reaction occurring within the last 10 years:  If all of the above answers are NO, then may proceed with Cephalosporin use.   Simvastatin Other (See Comments)   Unknown reaction per [facility]        Medication List     STOP taking these medications    senna-docusate 8.6-50 MG tablet Commonly known as: Senokot-S       TAKE these medications    acetaminophen  325 MG tablet Commonly known as: TYLENOL  Take 650 mg by mouth every 6 (six) hours as needed for mild pain.   amiodarone  200 MG tablet Commonly known as: PACERONE  Take 1 tablet (200 mg total) by mouth daily.   ammonium lactate 12 % lotion Commonly known as: LAC-HYDRIN Apply 1 Application topically 2 (two) times daily as needed for dry skin.   artificial tears ophthalmic solution Place 1 drop into both eyes 4 (four) times daily as needed for dry eyes.   bisacodyl  5 MG EC tablet Commonly known as: DULCOLAX Take 2 tablets (10 mg total) by mouth daily. Start taking on: August 25, 2024   Boost Glucose Control Liqd Take  237 mLs by mouth with breakfast, with lunch, and with evening meal.   brimonidine  0.2 % ophthalmic solution Commonly known as: ALPHAGAN  Place 1 drop into both eyes every 8 (eight) hours.   dorzolamide -timolol  2-0.5 % ophthalmic solution Commonly known as: COSOPT  Place 1 drop into both eyes 2 (two) times daily.   ferrous sulfate  325 (65 FE) MG tablet Take 325 mg by mouth every Monday, Wednesday, and Friday.   hydrocortisone  25 MG suppository Commonly known as: ANUSOL -HC Place 1 suppository (25 mg total) rectally 2 (two) times daily for 7 days. 6am and 4pm   insulin  glargine 100 UNIT/ML injection Commonly known as: LANTUS  Inject 10-20 Units into the skin See admin instructions. If bs is  175 or below =10 units, if bs is 175 = 15 units. If bs is 200= 20 units   Jardiance 10 MG Tabs tablet Generic drug: empagliflozin Take 10 mg by mouth daily.    latanoprost  0.005 % ophthalmic solution Commonly known as: XALATAN  Place 1 drop into both eyes at bedtime.   loratadine  10 MG tablet Commonly known as: CLARITIN  Take 10 mg by mouth daily.   multivitamin with minerals Tabs tablet Take 1 tablet by mouth daily.   nicotine  polacrilex 2 MG lozenge Commonly known as: COMMIT Take 2 mg by mouth as needed for smoking cessation.   NovoLOG  FlexPen 100 UNIT/ML FlexPen Generic drug: insulin  aspart Inject 2-9 Units into the skin 4 (four) times daily -  before meals and at bedtime. If blood sugar is 121-150= 2 units, 201-250= 3 units, 251-300= 5 units, 301-350= 7 units, 351-400= 9 units, if greater than 400 call provider   pantoprazole  20 MG tablet Commonly known as: PROTONIX  Take 1 tablet (20 mg total) by mouth daily.   polyethylene glycol 17 g packet Commonly known as: MIRALAX  / GLYCOLAX  Take 17 g by mouth 2 (two) times daily. What changed: when to take this   Salicylic Acid 3 % Sham Apply 1 Application topically See admin instructions. Apply to scalp every Tuesday and Friday   thiamine  100 MG tablet Commonly known as: Vitamin B-1 Take 1 tablet (100 mg total) by mouth daily for 4 days. Start taking on: August 25, 2024   triamcinolone cream 0.1 % Commonly known as: KENALOG Apply 1 Application topically 2 (two) times daily as needed (scalp lesion).   urea 20 % cream Commonly known as: CARMOL Apply 1 application  topically 2 (two) times daily. Apply a dime size amount        Discharge Instructions: Please refer to Patient Instructions section of EMR for full details.  Patient was counseled important signs and symptoms that should prompt return to medical care, changes in medications, dietary instructions, activity restrictions, and follow up appointments.   Follow-Up Appointments:  Follow-up Information     Marsa Arlean BIRCH, MD Follow up in 3 day(s).   Specialty: Internal Medicine Contact information: [address] [place] [address] [phone]         [facility] Follow up.   Specialty: Gastroenterology Contact information: [address] [place]  [address] [phone]                Romelle Booty, MD 08/24/2024, 3:07 PM PGY-3, [place] [place] [facility] "

## 2024-08-24 NOTE — Progress Notes (Signed)
 Pt became verbally upset regarding delayed discharge,stating frustration that [facility] have not arrived yet for transport.Pt expressed anger toward staff; refused scheduled meds and nursing care.Pt educated but continued to refuse.Pt safely assessed, no acute distress noted.Call light within reach.Charge nurse notified of situation.[facility] transport still pending and re-offer care as appropriate.

## 2024-08-24 NOTE — Plan of Care (Signed)
  Problem: Clinical Measurements: Goal: Will remain free from infection Outcome: Progressing   Problem: Nutrition: Goal: Adequate nutrition will be maintained Outcome: Progressing   Problem: Coping: Goal: Level of anxiety will decrease Outcome: Progressing   Problem: Pain Managment: Goal: General experience of comfort will improve and/or be controlled Outcome: Progressing   Problem: Safety: Goal: Ability to remain free from injury will improve Outcome: Progressing   Problem: Skin Integrity: Goal: Risk for impaired skin integrity will decrease Outcome: Progressing

## 2024-08-24 NOTE — Discharge Instructions (Signed)
 Dear Marsha JINNY Na,  Thank you for letting us  participate in your care. You were hospitalized for  Rectal bleed.    POST-HOSPITAL & CARE INSTRUCTIONS Please review your updated medication list for any changes Please return to the hospital if you experience any worsening bleeding, pain, dizziness, lightheadedness, falls Please follow up with [facility] and your PCP Go to your follow up appointments (listed below)   DOCTOR'S APPOINTMENT   No future appointments.   Take care and be well!  Family Medicine Teaching Service Inpatient Team [place]  [facility]  [address] [place], [place] [address] [phone]

## 2024-08-24 NOTE — Progress Notes (Signed)
 Patient agitated because of delayed transportation, walking around in the room not following the instruction. Psych rapid response nurse was called to calm down the patient and to prevent the fall.

## 2024-08-24 NOTE — Progress Notes (Signed)
 Patient being discharged home today. Will send message to our office appt scheduler to arrange for hospital follow up in our office in 3-4 weeks.

## 2024-09-22 ENCOUNTER — Ambulatory Visit: Admitting: Physician Assistant
